# Patient Record
Sex: Male | Born: 1935 | Race: White | Hispanic: No | Marital: Married | Smoking: Former smoker
Health system: Southern US, Community
[De-identification: ages and names within clinical notes are randomized; demographics above are authoritative.]

## PROBLEM LIST (undated history)

## (undated) ENCOUNTER — Emergency Department (HOSPITAL_BASED_OUTPATIENT_CLINIC_OR_DEPARTMENT_OTHER): Admission: EM | Payer: Medicare Other | Source: Home / Self Care

## (undated) DIAGNOSIS — Z9289 Personal history of other medical treatment: Secondary | ICD-10-CM

## (undated) DIAGNOSIS — M545 Low back pain, unspecified: Secondary | ICD-10-CM

## (undated) DIAGNOSIS — J42 Unspecified chronic bronchitis: Secondary | ICD-10-CM

## (undated) DIAGNOSIS — F32A Depression, unspecified: Secondary | ICD-10-CM

## (undated) DIAGNOSIS — E7800 Pure hypercholesterolemia, unspecified: Secondary | ICD-10-CM

## (undated) DIAGNOSIS — J45909 Unspecified asthma, uncomplicated: Secondary | ICD-10-CM

## (undated) DIAGNOSIS — J329 Chronic sinusitis, unspecified: Secondary | ICD-10-CM

## (undated) DIAGNOSIS — K219 Gastro-esophageal reflux disease without esophagitis: Secondary | ICD-10-CM

## (undated) DIAGNOSIS — Z9989 Dependence on other enabling machines and devices: Secondary | ICD-10-CM

## (undated) DIAGNOSIS — M1990 Unspecified osteoarthritis, unspecified site: Secondary | ICD-10-CM

## (undated) DIAGNOSIS — F419 Anxiety disorder, unspecified: Secondary | ICD-10-CM

## (undated) DIAGNOSIS — M79606 Pain in leg, unspecified: Secondary | ICD-10-CM

## (undated) DIAGNOSIS — G8929 Other chronic pain: Secondary | ICD-10-CM

## (undated) DIAGNOSIS — I2510 Atherosclerotic heart disease of native coronary artery without angina pectoris: Secondary | ICD-10-CM

## (undated) DIAGNOSIS — F329 Major depressive disorder, single episode, unspecified: Secondary | ICD-10-CM

## (undated) DIAGNOSIS — J302 Other seasonal allergic rhinitis: Secondary | ICD-10-CM

## (undated) DIAGNOSIS — C4491 Basal cell carcinoma of skin, unspecified: Secondary | ICD-10-CM

## (undated) DIAGNOSIS — G4733 Obstructive sleep apnea (adult) (pediatric): Secondary | ICD-10-CM

## (undated) DIAGNOSIS — I493 Ventricular premature depolarization: Secondary | ICD-10-CM

## (undated) DIAGNOSIS — I519 Heart disease, unspecified: Secondary | ICD-10-CM

## (undated) HISTORY — PX: TOTAL HIP ARTHROPLASTY: SHX124

## (undated) HISTORY — DX: Pain in leg, unspecified: M79.606

## (undated) HISTORY — PX: BASAL CELL CARCINOMA EXCISION: SHX1214

## (undated) HISTORY — PX: BACK SURGERY: SHX140

## (undated) HISTORY — PX: CHOLECYSTECTOMY: SHX55

## (undated) HISTORY — DX: Ventricular premature depolarization: I49.3

## (undated) HISTORY — PX: SHOULDER ARTHROSCOPY: SHX128

## (undated) HISTORY — PX: CORONARY ANGIOPLASTY WITH STENT PLACEMENT: SHX49

## (undated) HISTORY — DX: Heart disease, unspecified: I51.9

## (undated) HISTORY — DX: Personal history of other medical treatment: Z92.89

## (undated) HISTORY — PX: JOINT REPLACEMENT: SHX530

## (undated) HISTORY — PX: COLONOSCOPY: SHX174

## (undated) HISTORY — PX: TONSILLECTOMY: SUR1361

---

## 1965-02-15 HISTORY — PX: APPENDECTOMY: SHX54

## 1978-10-17 HISTORY — PX: CARPAL TUNNEL RELEASE: SHX101

## 1987-02-16 HISTORY — PX: ANTERIOR CERVICAL DISCECTOMY: SHX1160

## 1996-02-16 HISTORY — PX: ANTERIOR CERVICAL DECOMP/DISCECTOMY FUSION: SHX1161

## 2000-02-16 HISTORY — PX: REVISION TOTAL HIP ARTHROPLASTY: SHX766

## 2003-01-22 ENCOUNTER — Encounter: Admission: RE | Admit: 2003-01-22 | Discharge: 2003-01-22 | Payer: Self-pay | Admitting: Neurosurgery

## 2003-02-26 ENCOUNTER — Inpatient Hospital Stay (HOSPITAL_COMMUNITY): Admission: RE | Admit: 2003-02-26 | Discharge: 2003-02-28 | Payer: Self-pay | Admitting: Orthopaedic Surgery

## 2004-12-22 ENCOUNTER — Ambulatory Visit (HOSPITAL_COMMUNITY): Admission: RE | Admit: 2004-12-22 | Discharge: 2004-12-22 | Payer: Self-pay | Admitting: Orthopaedic Surgery

## 2005-05-27 ENCOUNTER — Ambulatory Visit (HOSPITAL_COMMUNITY): Admission: RE | Admit: 2005-05-27 | Discharge: 2005-05-27 | Payer: Self-pay | Admitting: Orthopaedic Surgery

## 2008-09-16 ENCOUNTER — Encounter: Admission: RE | Admit: 2008-09-16 | Discharge: 2008-09-16 | Payer: Self-pay | Admitting: Orthopaedic Surgery

## 2008-09-23 ENCOUNTER — Ambulatory Visit (HOSPITAL_COMMUNITY): Admission: RE | Admit: 2008-09-23 | Discharge: 2008-09-24 | Payer: Self-pay | Admitting: Orthopaedic Surgery

## 2009-03-22 ENCOUNTER — Encounter: Admission: RE | Admit: 2009-03-22 | Discharge: 2009-03-22 | Payer: Self-pay | Admitting: Orthopaedic Surgery

## 2009-09-06 ENCOUNTER — Inpatient Hospital Stay (HOSPITAL_COMMUNITY): Admission: EM | Admit: 2009-09-06 | Discharge: 2009-09-08 | Payer: Self-pay | Admitting: Emergency Medicine

## 2009-09-06 ENCOUNTER — Ambulatory Visit: Payer: Self-pay | Admitting: Interventional Radiology

## 2009-09-06 ENCOUNTER — Encounter: Payer: Self-pay | Admitting: Emergency Medicine

## 2009-09-12 ENCOUNTER — Emergency Department (HOSPITAL_COMMUNITY): Admission: EM | Admit: 2009-09-12 | Discharge: 2009-09-12 | Payer: Self-pay | Admitting: Emergency Medicine

## 2010-05-02 LAB — BASIC METABOLIC PANEL
BUN: 28 mg/dL — ABNORMAL HIGH (ref 6–23)
CO2: 24 mEq/L (ref 19–32)
Calcium: 9.8 mg/dL (ref 8.4–10.5)
Chloride: 109 mEq/L (ref 96–112)
Creatinine, Ser: 1.1 mg/dL (ref 0.4–1.5)
GFR calc Af Amer: >60 mL/min (ref >60)
GFR calc non Af Amer: >60 mL/min (ref >60)
Glucose, Bld: 129 mg/dL — ABNORMAL HIGH (ref 70–99)
Potassium: 4.2 mEq/L (ref 3.5–5.1)
Sodium: 143 mEq/L (ref 135–145)

## 2010-05-02 LAB — DIFFERENTIAL
Basophils Absolute: 0 10*3/uL (ref 0.0–0.1)
Basophils Absolute: 0 10*3/uL (ref 0.0–0.1)
Basophils Relative: 0 % (ref 0–1)
Basophils Relative: 0 % (ref 0–1)
Eosinophils Absolute: 0.5 10*3/uL (ref 0.0–0.7)
Eosinophils Absolute: 0.3 10*3/uL (ref 0.0–0.7)
Eosinophils Relative: 5 % (ref 0–5)
Eosinophils Relative: 2 % (ref 0–5)
Lymphocytes Relative: 11 % — ABNORMAL LOW (ref 12–46)
Lymphocytes Relative: 8 % — ABNORMAL LOW (ref 12–46)
Lymphs Abs: 1.1 10*3/uL (ref 0.7–4.0)
Lymphs Abs: 1 10*3/uL (ref 0.7–4.0)
Monocytes Absolute: 0.9 10*3/uL (ref 0.1–1.0)
Monocytes Absolute: 0.7 10*3/uL (ref 0.1–1.0)
Monocytes Relative: 10 % (ref 3–12)
Monocytes Relative: 5 % (ref 3–12)
Neutro Abs: 7 10*3/uL (ref 1.7–7.7)
Neutro Abs: 11.9 10*3/uL — ABNORMAL HIGH (ref 1.7–7.7)
Neutrophils Relative %: 74 % (ref 43–77)
Neutrophils Relative %: 86 % — ABNORMAL HIGH (ref 43–77)

## 2010-05-02 LAB — CBC
HCT: 40.2 % (ref 39.0–52.0)
HCT: 43 % (ref 39.0–52.0)
Hemoglobin: 13.7 g/dL (ref 13.0–17.0)
Hemoglobin: 14.9 g/dL (ref 13.0–17.0)
MCH: 32.9 pg (ref 26.0–34.0)
MCH: 32.3 pg (ref 26.0–34.0)
MCHC: 34.1 g/dL (ref 30.0–36.0)
MCHC: 34.5 g/dL (ref 30.0–36.0)
MCV: 96.4 fL (ref 78.0–100.0)
MCV: 93.7 fL (ref 78.0–100.0)
Platelets: 240 10*3/uL (ref 150–400)
Platelets: 259 10*3/uL (ref 150–400)
RBC: 4.17 MIL/uL — ABNORMAL LOW (ref 4.22–5.81)
RBC: 4.59 MIL/uL (ref 4.22–5.81)
RDW: 14.6 % (ref 11.5–15.5)
RDW: 14 % (ref 11.5–15.5)
WBC: 9.5 10*3/uL (ref 4.0–10.5)
WBC: 13.9 10*3/uL — ABNORMAL HIGH (ref 4.0–10.5)

## 2010-05-02 LAB — URINALYSIS, ROUTINE W REFLEX MICROSCOPIC
Bilirubin Urine: NEGATIVE
Bilirubin Urine: NEGATIVE
Glucose, UA: NEGATIVE mg/dL
Glucose, UA: NEGATIVE mg/dL
Hgb urine dipstick: NEGATIVE
Hgb urine dipstick: NEGATIVE
Ketones, ur: NEGATIVE mg/dL
Ketones, ur: NEGATIVE mg/dL
Nitrite: NEGATIVE
Nitrite: NEGATIVE
Protein, ur: NEGATIVE mg/dL
Protein, ur: NEGATIVE mg/dL
Specific Gravity, Urine: 1.005 (ref 1.005–1.030)
Specific Gravity, Urine: 1.028 (ref 1.005–1.030)
Urobilinogen, UA: 1 mg/dL (ref 0.0–1.0)
Urobilinogen, UA: 1 mg/dL (ref 0.0–1.0)
pH: 7 (ref 5.0–8.0)
pH: 5.5 (ref 5.0–8.0)

## 2010-05-02 LAB — COMPREHENSIVE METABOLIC PANEL
ALT: 25 U/L (ref 0–53)
AST: 28 U/L (ref 0–37)
Albumin: 3.2 g/dL — ABNORMAL LOW (ref 3.5–5.2)
Alkaline Phosphatase: 76 U/L (ref 39–117)
BUN: 14 mg/dL (ref 6–23)
CO2: 30 mEq/L (ref 19–32)
Calcium: 9.3 mg/dL (ref 8.4–10.5)
Chloride: 101 mEq/L (ref 96–112)
Creatinine, Ser: 0.93 mg/dL (ref 0.4–1.5)
GFR calc Af Amer: >60 mL/min (ref >60)
GFR calc non Af Amer: >60 mL/min (ref >60)
Glucose, Bld: 107 mg/dL — ABNORMAL HIGH (ref 70–99)
Potassium: 4.1 mEq/L (ref 3.5–5.1)
Sodium: 136 mEq/L (ref 135–145)
Total Bilirubin: 0.7 mg/dL (ref 0.3–1.2)
Total Protein: 6.8 g/dL (ref 6.0–8.3)

## 2010-05-02 LAB — LIPASE, BLOOD: Lipase: 21 U/L (ref 11–59)

## 2010-05-23 LAB — CBC
HCT: 45.1 % (ref 39.0–52.0)
Hemoglobin: 15.7 g/dL (ref 13.0–17.0)
MCHC: 34.8 g/dL (ref 30.0–36.0)
MCV: 93.9 fL (ref 78.0–100.0)
Platelets: 258 10*3/uL (ref 150–400)
RBC: 4.8 MIL/uL (ref 4.22–5.81)
RDW: 13.9 % (ref 11.5–15.5)
WBC: 8.1 10*3/uL (ref 4.0–10.5)

## 2010-05-23 LAB — COMPREHENSIVE METABOLIC PANEL
ALT: 27 U/L (ref 0–53)
AST: 28 U/L (ref 0–37)
Albumin: 3.8 g/dL (ref 3.5–5.2)
Alkaline Phosphatase: 102 U/L (ref 39–117)
BUN: 23 mg/dL (ref 6–23)
CO2: 27 mEq/L (ref 19–32)
Calcium: 10 mg/dL (ref 8.4–10.5)
Chloride: 104 mEq/L (ref 96–112)
Creatinine, Ser: 1.04 mg/dL (ref 0.4–1.5)
GFR calc Af Amer: >60 mL/min (ref >60)
GFR calc non Af Amer: >60 mL/min (ref >60)
Glucose, Bld: 118 mg/dL — ABNORMAL HIGH (ref 70–99)
Potassium: 4.2 mEq/L (ref 3.5–5.1)
Sodium: 138 mEq/L (ref 135–145)
Total Bilirubin: 1 mg/dL (ref 0.3–1.2)
Total Protein: 7.2 g/dL (ref 6.0–8.3)

## 2010-05-23 LAB — DIFFERENTIAL
Basophils Absolute: 0 10*3/uL (ref 0.0–0.1)
Basophils Relative: 0 % (ref 0–1)
Eosinophils Absolute: 0.4 10*3/uL (ref 0.0–0.7)
Eosinophils Relative: 6 % — ABNORMAL HIGH (ref 0–5)
Lymphocytes Relative: 20 % (ref 12–46)
Lymphs Abs: 1.6 10*3/uL (ref 0.7–4.0)
Monocytes Absolute: 0.7 10*3/uL (ref 0.1–1.0)
Monocytes Relative: 8 % (ref 3–12)
Neutro Abs: 5.3 10*3/uL (ref 1.7–7.7)
Neutrophils Relative %: 65 % (ref 43–77)

## 2010-06-27 ENCOUNTER — Emergency Department (INDEPENDENT_AMBULATORY_CARE_PROVIDER_SITE_OTHER): Payer: Medicare Other

## 2010-06-27 ENCOUNTER — Emergency Department (HOSPITAL_BASED_OUTPATIENT_CLINIC_OR_DEPARTMENT_OTHER)
Admission: EM | Admit: 2010-06-27 | Discharge: 2010-06-27 | Disposition: A | Discharge status: Home / Self Care | Payer: Medicare Other | Attending: Emergency Medicine | Admitting: Emergency Medicine

## 2010-06-27 DIAGNOSIS — W230XXA Caught, crushed, jammed, or pinched between moving objects, initial encounter: Secondary | ICD-10-CM

## 2010-06-27 DIAGNOSIS — S61209A Unspecified open wound of unspecified finger without damage to nail, initial encounter: Secondary | ICD-10-CM

## 2010-06-27 DIAGNOSIS — IMO0002 Reserved for concepts with insufficient information to code with codable children: Secondary | ICD-10-CM | POA: Insufficient documentation

## 2010-06-30 NOTE — Op Note (Signed)
NAME:  Larry Church, Larry Church                ACCOUNT NO.:  [id number]   MEDICAL RECORD NO.:  [id number]          PATIENT TYPE:  OIB   LOCATION:  5039                         FACILITY:  [facility]   PHYSICIAN:  Mark C. Ophelia Charter, M.D.    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   POSTOPERATIVE DIAGNOSIS:  L2-3 central stenosis with right foraminal  stenosis.   PROCEDURE:  L2-3 central decompression, bilateral foraminotomy.   SURGEON:  Annell Greening, MD   ANESTHESIA:  GOT plus Marcaine local.   ESTIMATED BLOOD LOSS:  200 mL.   BRIEF HISTORY:  This 77 year old male has had previous decompression at  L3-4 level and developed stenosis at L2-3 central foramina with more leg  pain than back pain and with neurogenic claudication symptoms with  ambulation.   PROCEDURE:  After induction of general anesthesia, orotracheal  intubation, preoperative vancomycin, placed on chest rolls.  Back was  prepped with DuraPrep, arms placed in 90/90 of pads underneath the  shoulders on the nerve and ulnar nerve.  The area was squared with  towels, Sterile skin mark used in the back and Betadine V-Drape applied  with laminectomy sheet.  Surgical time-out checklist was completed.  Incision was made extending the old incision proximally.  Subperiosteal  dissection after needle localization with spinal needle which was  directly over the L2-3 disk.  Once dissection down further spinous  process and central laminectomy was performed.  Kocher clamp was placed  above and below the laminectomy area and confirmed with a cross-table  lateral x-ray, this was the planned areas for decompression.  Hypertrophic thick chunks of ligaments were removed.  There were facet  spurs overhanging causing compression at the shoulder of the nerve root  worse on the right than the  left and this was with hypertrophic  ligamentum which showed some areas of previous cortisone injections.  Dura was intact.  There was no spinal fluid leakage.  Disk was inspected  on both sides.  No diskectomy was performed.  There was some combination  of hard disk with endplate spurring present.  After decompression  palpation on the hockey stick with both gutters showed smooth bone, bone  was removed out on most of the level of the pedicle on each side.  Wound  was irrigated.  Some thrombin-soaked Gelfoam were placed in the gutters.  These were removed.  There was some mild upper dural bleeding which was  partially controlled with bipolar cautery.  Fascia was closed with 0  Vicryl, 2-0  Vicryl.  After irrigation, inspection for bleeding some placement of new  Gelfoam thrombin and then removal of the thrombin-soaked Gelfoam and  patties.  At the time of closure, patty count was correct.  2-0 Vicryl  placed in the subcutaneous tissue and subcuticular skin closure.  Tincture of Benzoin and Steri-Strips as postop dressing.      Mark C. Ophelia Charter, M.D.  Electronically Signed  Mark C. Ophelia Charter, M.D.  Electronically Signed    MCY/MEDQ  D:  09/23/2008  T:  09/24/2008  Job:  [id number]

## 2010-06-30 NOTE — Op Note (Signed)
NAME:  Larry Church, Larry Church                ACCOUNT NO.:  [id number]   MEDICAL RECORD NO.:  [id number]          PATIENT TYPE:  OIB   LOCATION:  5039                         FACILITY:  [facility]   PHYSICIAN:  Mark C. Ophelia Charter, M.D.    DATE OF BIRTH:  [birth year]   DATE OF PROCEDURE:  DATE OF DISCHARGE:                               OPERATIVE REPORT   ADDENDUM   PREOPERATIVE DIAGNOSIS:  L2-3 stenosis with right foraminal stenosis.   POSTOPERATIVE DIAGNOSIS:  L2-3 stenosis with right foraminal stenosis.   PROCEDURE:  L2-3 central decompression and foraminotomy.   SURGEON:  Mark C. Ophelia Charter, MD   ASSISTANT:  Wende Neighbors, PA-C   Microscope was used for visualization for decompression and Maud Deed, PA, assisted with careful retraction of the neural elements  during the procedure.      Mark C. Ophelia Charter, M.D.  Electronically Signed     Veverly Fells. Ophelia Charter, M.D.  Electronically Signed    MCY/MEDQ  D:  09/23/2008  T:  09/24/2008  Job:  [id number]

## 2010-07-03 NOTE — Op Note (Signed)
NAME:  Larry Church, Larry Church                          ACCOUNT NO.:  [id number]   MEDICAL RECORD NO.:  [id number]                   PATIENT TYPE:  INP   LOCATION:  2899                                 FACILITY:  [facility]   PHYSICIAN:  Lubertha Basque. Jerl Santos, M.D.             DATE OF BIRTH:  [birth year]   DATE OF PROCEDURE:  02/26/2003  DATE OF DISCHARGE:                                 OPERATIVE REPORT   PREOPERATIVE DIAGNOSIS:  Painful right total hip replacement.   POSTOPERATIVE DIAGNOSIS:  Painful right total hip replacement.   PROCEDURE:  Revision right total hip replacement.   ANESTHESIA:  General.   SURGEON:  Lubertha Basque. Jerl Santos, M.D.   ASSISTANT:  Bradley Ferris, M.D. and Lindwood Qua, P.A.   INDICATIONS FOR PROCEDURE:  The patient is a 77 year old man about 15 years  out from a successful right hip replacement.  He has developed recurrent  groin and buttock pain with no thigh pain.  It persisted for many months. On  x-ray, he has significant polywear with migration of the femoral head  towards the metal acetabular component.  His acetabular component appears to  be well fixed with no sign of lysis. His femoral component appears well  fixed proximally.  There is the suggestion of some wobble distally.  He is  offered a revision procedure to consist of poly exchange with possible  revision of the femoral component as well.  Informed operative consent was  obtained after discussion of the possible complications of, reaction to  anesthesia, infection, DVT, PE, dislocation, and death.   DESCRIPTION OF PROCEDURE:  The patient was seen in the operating suite where  general anesthetic was applied without difficulty.  He was positioned in the  lateral decubitus position.  All bony prominences were appropriately padded  and the axillary roll was used.  Hip positioners were also utilized. He was  then prepped and draped in normal sterile fashion.  After the administration  of preop antibiotics,  a portion of the old incision was made with the  posterior approach taken to his hip.  We dissected through an abundance of  adipose tissue through the IT band and gluteus maximus fascia.  These  structures incised longitudinally to expose the short external rotators and  posterior capsule of the hip. These structures were tagged and reflected.  I  then dislocated his hip.  I exposed the femoral component completely  removing soft tissues down to the calcar.  I also removed some bone along  the lateral aspect of the component in the greater trochanter to again  expose the entire proximal portion of the component.  He did have some  devitalized material along the proximal stem which extended down about 1 or  2 cm around much of the stem but then firm bone was encountered.  I placed a  curette in the hole in the stem and tried to rotate the  stem which seemed to  be very well fixed.  I then placed a slap hammer in the same hole and then  with great force tried to extract the stem.  The stem did not move.  It was  my assessment that the stem was stable.  The existing ball was removed off  the stem prior to this portion of the procedure. I then exposed the  acetabular component completely.  I used a bone tap and Kocher and push and  pull on the acetabular component and this too seemed to be very well fixed.  I removed the worn polyethylene liner.  I used the curette to remove some  yellowish material consistent with polydisease from the holes in the cup and  irrigated this thoroughly.  I then placed a new Zimmer _________1 cross link  poly component.  This seated fully.  I then placed the new 32 mm cobalt  chrome Zimmer head medium length on the stem.  The hip reduced and was  stable in extension with external rotation and flexion with internal  rotation.  The wound was thoroughly irrigated. I did take cultures in the  initial portion of the case and those were sent to pathology for routine   study.  There was no sign of infection on entry into the hip.  The short  external rotators and posterior capsule reapproximated to the greater  trochanter with nonabsorbable suture.  The wound was again irrigated. I  closed the fascial structures with Ethibond and #1 Vicryl in interrupted  fashion.  Adipose tissue was reapproximated in 2 or 3 layers with undyed  Vicryl followed by skin closure with staples.  Adaptic was applied to the  wound followed by dry gauze tape. Estimated blood loss and intraoperative  fluids can be obtained from anesthesia records.   DISPOSITION:  The patient was extubated in the operating room and taken to  the recovery room stable.  Plans were for him to be admitted to the  orthopedic surgery service for appropriate postop care to include  perioperative antibiotics and Coumadin plus Lovenox for DVT prophylaxis.                                               Lubertha Basque Jerl Santos, M.D.    PGD/MEDQ  D:  02/26/2003  T:  02/26/2003  Job:  [id number]

## 2010-07-03 NOTE — Op Note (Signed)
Larry Church, Larry Church                ACCOUNT NO.:  [id number]   MEDICAL RECORD NO.:  [id number]          PATIENT TYPE:  AMB   LOCATION:  SDS                          FACILITY:  [facility]   PHYSICIAN:  Lubertha Basque. Dalldorf, M.D.DATE OF BIRTH:  [birth year]   DATE OF PROCEDURE:  05/27/2005  DATE OF DISCHARGE:                                 OPERATIVE REPORT   PREOPERATIVE DIAGNOSES:  1.  Right shoulder rotator cuff tear.  2.  Right shoulder acromioclavicular degeneration.  3.  Right shoulder biceps degeneration.   POSTOPERATIVE DIAGNOSES:  1.  Right shoulder rotator cuff tear.  2.  Right shoulder acromioclavicular degeneration.  3.  Right shoulder biceps degeneration.   PROCEDURES:  1.  Right shoulder arthroscopic debridement.  2.  Right shoulder arthroscopic acromioplasty.  3.  Right shoulder arthroscopic AC resection.  4.  Right shoulder arthroscopic biceps tenotomy.   ANESTHESIA:  General and block.   ATTENDING SURGEON:  Lubertha Basque. Jerl Santos, M.D.   INDICATIONS FOR PROCEDURE:  The patient is a 77 year old male with a long  history of right shoulder pain.  He has had pain which persists despite oral  anti-inflammatories and injections which have helped him transiently.  He  has pain which limits his ability to rest and to use his arm.  It is felt  that he likely has a large rotator cuff tear but has things consistent with  impingement as well and AC pain.  He is status post a successful arthroscopy  on the opposite shoulder many years ago for a similar problem and he is  offered the same procedure on this side.  Informed operative consent was  obtained after discussion about possible complications of reaction to  anesthesia, and infection.   DESCRIPTION OF PROCEDURE:  The patient was taken to the operating suite  where general anesthetic was applied without difficulty.  He was also given  a block in the preanesthesia area.  He was placed in a beach-chair position  and prepped and  draped in a normal sterile fashion.  After the  administration of intravenous clindamycin, arthroscopy of the right shoulder  was performed through a total of three portals.  The glenohumeral joint  showed some mild to moderate degenerative change.  He did have a large  osteophyte on the posterior superior aspect of the labrum which I removed.  This seemed to be loose and potentially could have been getting caught in  the joint.  He also had severe degeneration of the biceps tendon and a  simple tenotomy was performed as on the opposite side with a debridement  back towards the labrum.  He had a massive rotator cuff tear which was  irreparable and a brief debridement was done here.  He had a prominent  subacromial morphology addressed with an acromioplasty back to a flat  surface.  This was done with the bur in the lateral position followed by  transfer of the bur to the posterior position.  He also had bone-on-bone  contact and an impressive spur at the [facility] joint, and a formal AC  decompression was done.  The shoulder was thoroughly irrigated followed by  placement of some Marcaine at the end of the case.  Simple sutures of nylon  were used to loosely reapproximate the portals followed by Adaptic and dry  gauze dressing with tape.  Estimated blood loss and intraoperative fluids  can be obtained from anesthesia records.   DISPOSITION:  The patient was extubated in the operating room and taken to  the recovery room in stable addition.  Plans were for him go home same-day  and follow up in the office in less than a week.  I will contact him by  phone tonight.  The procedure was done in the main operating room as the  patient had a history of sleep apnea though he said it was cured by sleeping  on an  extra pillow.      Lubertha Basque Jerl Santos, M.D.  Electronically Signed     PGD/MEDQ  D:  05/27/2005  T:  05/27/2005  Job:  [id number]

## 2010-07-03 NOTE — Discharge Summary (Signed)
NAME:  Larry Church, Larry Church                          ACCOUNT NO.:  [id number]   MEDICAL RECORD NO.:  [id number]                   PATIENT TYPE:  INP   LOCATION:  5010                                 FACILITY:  [facility]   PHYSICIAN:  Lubertha Basque. Jerl Santos, M.D.             DATE OF BIRTH:  [birth year]   DATE OF ADMISSION:  02/26/2003  DATE OF DISCHARGE:  02/28/2003                                 DISCHARGE SUMMARY   ADMITTING DIAGNOSES:  1. Status post right total hip replacement with worn out poly liner.  2. Status post neck fusion.  3. Status post lumbar laminectomy.  4. Hypercholesterolemia.   DISCHARGE DIAGNOSES:  1. Status post right total hip replacement with worn out poly liner.  2. Status post neck fusion.  3. Status post lumbar laminectomy.  4. Hypercholesterolemia.   OPERATIONS:  A right hip replacement, poly swap for the acetabular liner.   BRIEF HISTORY:  Larry Church is a 77 year old white male patient well known to  our practice who many years ago had a right total hip replacement, and on x-  ray now is having wear that is obvious of the poly liner in the acetabulum.  He is having some thigh pain and some groin pain, and, after consultation  with the patient, we decided to proceed with poly liner replacement.   PERTINENT LABORATORY AND X-RAY FINDINGS:  WBC 7.4, hemoglobin 12.8,  hematocrit 36.6.  INR's drawn serially; the last one was 1.6.  Glucose 137.  Other indices were normal.  Wound growth negative.  Normal sinus rhythm with  a first degree AV block on EKG.   COURSE IN THE HOSPITAL:  He was admitted postoperatively, placed on  __________ p.o., and IM analgesics for pain, IV Ancef 1 gm q.8h.  He was  also kept on his usual medicines of Effexor, Altace, Singulair, Zocor,  Protonix, and then on low-dose Coumadin protocol and Lovenox protocol per  pharmacy.  The first day postoperatively, he did very well.  He was up and  out of bed with physical therapy, and could be  weightbearing as tolerated.  The second day he was dressing changed.  The wound was noted to be benign.  He was eating and voiding well, pain controlled by oral medications, and was  discharged on day #3.   CONDITION ON DISCHARGE:  Improved.   DISCHARGE MEDICATIONS:  1. Vicodin for pain, one q.4-6h. as needed.  2. Pharmacy for Coumadin protocol, and they would dose him accordingly.  3. Protonix 40 mg one p.o. daily.  4. Zocor 20 mg one p.o. daily.  5. Singulair 10 mg one p.o. daily.  6. Altace 5 mg one p.o. b.i.d.  7. Effexor 37.5 one p.o. daily.   ACTIVITY:  Weightbearing as tolerated.  [facility] for home  therapy and blood draws for pro times.   DIET:  Unrestricted.   WOUND CARE:  May change the  dressing daily.   FOLLOW UP:  Return to our office in 7-10 days.  Office number [phone] to  call.      Larry Church, P.A.                    Lubertha Basque Jerl Santos, M.D.    MC/MEDQ  D:  03/18/2003  T:  03/18/2003  Job:  [id number]

## 2011-02-16 HISTORY — PX: EYE MUSCLE SURGERY: SHX370

## 2011-02-16 HISTORY — PX: CATARACT EXTRACTION W/ INTRAOCULAR LENS  IMPLANT, BILATERAL: SHX1307

## 2012-04-29 ENCOUNTER — Emergency Department (HOSPITAL_BASED_OUTPATIENT_CLINIC_OR_DEPARTMENT_OTHER)
Admission: EM | Admit: 2012-04-29 | Discharge: 2012-04-29 | Disposition: A | Discharge status: Home / Self Care | Payer: Medicare Other | Attending: Emergency Medicine | Admitting: Emergency Medicine

## 2012-04-29 ENCOUNTER — Emergency Department (HOSPITAL_BASED_OUTPATIENT_CLINIC_OR_DEPARTMENT_OTHER): Payer: Medicare Other

## 2012-04-29 ENCOUNTER — Encounter (HOSPITAL_BASED_OUTPATIENT_CLINIC_OR_DEPARTMENT_OTHER): Payer: Self-pay | Admitting: Emergency Medicine

## 2012-04-29 DIAGNOSIS — Z79899 Other long term (current) drug therapy: Secondary | ICD-10-CM | POA: Insufficient documentation

## 2012-04-29 DIAGNOSIS — K219 Gastro-esophageal reflux disease without esophagitis: Secondary | ICD-10-CM | POA: Insufficient documentation

## 2012-04-29 DIAGNOSIS — Z7982 Long term (current) use of aspirin: Secondary | ICD-10-CM | POA: Insufficient documentation

## 2012-04-29 DIAGNOSIS — W010XXA Fall on same level from slipping, tripping and stumbling without subsequent striking against object, initial encounter: Secondary | ICD-10-CM | POA: Insufficient documentation

## 2012-04-29 DIAGNOSIS — M1990 Unspecified osteoarthritis, unspecified site: Secondary | ICD-10-CM | POA: Insufficient documentation

## 2012-04-29 DIAGNOSIS — S2249XA Multiple fractures of ribs, unspecified side, initial encounter for closed fracture: Secondary | ICD-10-CM | POA: Insufficient documentation

## 2012-04-29 DIAGNOSIS — Y9301 Activity, walking, marching and hiking: Secondary | ICD-10-CM | POA: Insufficient documentation

## 2012-04-29 DIAGNOSIS — Y92009 Unspecified place in unspecified non-institutional (private) residence as the place of occurrence of the external cause: Secondary | ICD-10-CM | POA: Insufficient documentation

## 2012-04-29 DIAGNOSIS — S2232XA Fracture of one rib, left side, initial encounter for closed fracture: Secondary | ICD-10-CM

## 2012-04-29 DIAGNOSIS — K5900 Constipation, unspecified: Secondary | ICD-10-CM | POA: Insufficient documentation

## 2012-04-29 HISTORY — DX: Unspecified osteoarthritis, unspecified site: M19.90

## 2012-04-29 HISTORY — DX: Other seasonal allergic rhinitis: J30.2

## 2012-04-29 HISTORY — DX: Gastro-esophageal reflux disease without esophagitis: K21.9

## 2012-04-29 MED ORDER — HYDROCODONE-ACETAMINOPHEN 5-325 MG PO TABS: 1.0000 | ORAL_TABLET | Freq: Four times a day (QID) | ORAL | Status: DC | PRN

## 2012-04-29 MED ORDER — HYDROCODONE-ACETAMINOPHEN 5-325 MG PO TABS
1.0000 | ORAL_TABLET | Freq: Once | ORAL | Status: AC
  Administered 2012-04-29: 1 via ORAL
  Filled 2012-04-29: qty 1

## 2012-04-29 NOTE — ED Notes (Signed)
Pt fell one week ago on a loose rock.  Pt states he fell on his cell phone at left rib area.  Noted bruising at area.  Pt states he turned this morning and believes something popped.  Pt having pain at left ribs.  No SOB.  Pain reproducible.

## 2012-04-29 NOTE — ED Provider Notes (Signed)
History     CSN: [id number]  Arrival date & time 04/29/12  0941   First MD Initiated Contact with Patient 04/29/12 [phone]      Chief Complaint  Patient presents with  . Fall  . Rib Injury    (Consider location/radiation/quality/duration/timing/severity/associated sxs/prior treatment) Patient is a 79 y.o. male presenting with fall.  Fall   Pt reports about a [place] and fell, landing on his left side and sustaining an injury to his L lower chest wall. He has had a bruise there for several days, but pain was minimal until this morning when he was getting up from the toilet and twisted. HE reports sudden onset of severe sharp pain, worse with movement, deep breath and coughing. He took a left over Darvocet and Aleve x 2 with minimal improvement.   No past medical history on file.  No past surgical history on file.  No family history on file.  History  Substance Use Topics  . Smoking status: Not on file  . Smokeless tobacco: Not on file  . Alcohol Use: Not on file      Review of Systems All other systems reviewed and are negative except as noted in HPI.   Allergies  Review of patient's allergies indicates not on file.  Home Medications  No current outpatient prescriptions on file.  BP 143/113  Pulse 99  Temp(Src) 97.6 F (36.4 C) (Oral)  Resp 22  Ht 5\' 5"  (1.651 m)  Wt 225 lb (102.059 kg)  BMI 37.44 kg/m2  SpO2 100%  Physical Exam  Nursing note and vitals reviewed. Constitutional: He is oriented to person, place, and time. He appears well-developed and well-nourished.  HENT:  Head: Normocephalic and atraumatic.  Eyes: EOM are normal. Pupils are equal, round, and reactive to light.  Neck: Normal range of motion. Neck supple.  Cardiovascular: Normal rate, normal heart sounds and intact distal pulses.   Pulmonary/Chest: Effort normal and breath sounds normal. He exhibits tenderness (point tender over the L anterior lower  ribs, healing bruise to that area).  Abdominal: Bowel sounds are normal. He exhibits no distension. There is no tenderness.  Musculoskeletal: Normal range of motion. He exhibits no edema and no tenderness.  Neurological: He is alert and oriented to person, place, and time. He has normal strength. No cranial nerve deficit or sensory deficit.  Skin: Skin is warm and dry. No rash noted.  Psychiatric: He has a normal mood and affect.    ED Course  Procedures (including critical care time)  Labs Reviewed - No data to display Dg Ribs Unilateral W/chest Left  04/29/2012  *RADIOLOGY REPORT*  Clinical Data: Fall.  Left rib pain.  LEFT RIBS AND CHEST - 3+ VIEW  Comparison: Two-view chest 09/07/2009.  Findings: Heart is mildly enlarged.  Mild interstitial coarsening is chronic.  Dedicated imaging of the ribs demonstrates multiple healed left- sided rib fractures.  At least to the acute minimally-displaced fractures are present in the left sixth and seventh ribs.  There may be a fracture of the left fifth rib as well.  There is no pneumothorax.  IMPRESSION:  1.  At least two new acute fractures are present in the left sixth and seventh ribs. 2.  Multiple healed remote fractures are present in the left ribs. 3.  Stable cardiomegaly without failure.   Original Report Authenticated By: Marin Roberts, M.D.      1. Rib fracture, left, closed, initial encounter  MDM  Xray as above. Pain improved with norco. Given Rx for same, incentive spirometry, advised PCP followup. Return for worsening.         Charles B. Bernette Mayers, MD 04/29/12 1043

## 2012-05-04 ENCOUNTER — Encounter: Payer: Self-pay | Admitting: Internal Medicine

## 2012-07-11 ENCOUNTER — Ambulatory Visit: Payer: Medicare Other | Admitting: Internal Medicine

## 2012-10-19 ENCOUNTER — Other Ambulatory Visit: Payer: Self-pay | Admitting: Neurosurgery

## 2012-10-19 DIAGNOSIS — M4800 Spinal stenosis, site unspecified: Secondary | ICD-10-CM

## 2012-10-26 ENCOUNTER — Ambulatory Visit
Admission: RE | Admit: 2012-10-26 | Discharge: 2012-10-26 | Disposition: A | Discharge status: Home / Self Care | Payer: Medicare Other | Source: Ambulatory Visit | Attending: Neurosurgery | Admitting: Neurosurgery

## 2012-10-26 ENCOUNTER — Other Ambulatory Visit: Payer: Self-pay | Admitting: Neurosurgery

## 2012-10-26 DIAGNOSIS — M4800 Spinal stenosis, site unspecified: Secondary | ICD-10-CM

## 2012-11-22 ENCOUNTER — Other Ambulatory Visit: Payer: Self-pay | Admitting: Neurosurgery

## 2012-11-28 ENCOUNTER — Encounter (HOSPITAL_COMMUNITY): Payer: Self-pay | Admitting: Respiratory Therapy

## 2012-11-30 ENCOUNTER — Encounter (HOSPITAL_COMMUNITY): Payer: Self-pay

## 2012-11-30 ENCOUNTER — Encounter (HOSPITAL_COMMUNITY)
Admission: RE | Admit: 2012-11-30 | Discharge: 2012-11-30 | Disposition: A | Discharge status: Home / Self Care | Payer: Medicare Other | Source: Ambulatory Visit | Attending: Anesthesiology | Admitting: Anesthesiology

## 2012-11-30 ENCOUNTER — Encounter (HOSPITAL_COMMUNITY)
Admission: RE | Admit: 2012-11-30 | Discharge: 2012-11-30 | Disposition: A | Discharge status: Home / Self Care | Payer: Medicare Other | Source: Ambulatory Visit | Attending: Neurosurgery | Admitting: Neurosurgery

## 2012-11-30 DIAGNOSIS — Z01818 Encounter for other preprocedural examination: Secondary | ICD-10-CM | POA: Insufficient documentation

## 2012-11-30 DIAGNOSIS — Z01812 Encounter for preprocedural laboratory examination: Secondary | ICD-10-CM | POA: Insufficient documentation

## 2012-11-30 DIAGNOSIS — Z01810 Encounter for preprocedural cardiovascular examination: Secondary | ICD-10-CM | POA: Insufficient documentation

## 2012-11-30 LAB — BASIC METABOLIC PANEL
BUN: 26 mg/dL — ABNORMAL HIGH (ref 6–23)
CO2: 23 mEq/L (ref 19–32)
Calcium: 9.3 mg/dL (ref 8.4–10.5)
Chloride: 103 mEq/L (ref 96–112)
Creatinine, Ser: 1.05 mg/dL (ref 0.50–1.35)
GFR calc Af Amer: 77 mL/min — ABNORMAL LOW (ref >90)
GFR calc non Af Amer: 66 mL/min — ABNORMAL LOW (ref >90)
Glucose, Bld: 120 mg/dL — ABNORMAL HIGH (ref 70–99)
Potassium: 4.3 mEq/L (ref 3.5–5.1)
Sodium: 135 mEq/L (ref 135–145)

## 2012-11-30 LAB — CBC
HCT: 41.7 % (ref 39.0–52.0)
Hemoglobin: 14.6 g/dL (ref 13.0–17.0)
MCH: 33 pg (ref 26.0–34.0)
MCHC: 35 g/dL (ref 30.0–36.0)
MCV: 94.3 fL (ref 78.0–100.0)
Platelets: 250 10*3/uL (ref 150–400)
RBC: 4.42 MIL/uL (ref 4.22–5.81)
RDW: 14.6 % (ref 11.5–15.5)
WBC: 9.4 10*3/uL (ref 4.0–10.5)

## 2012-11-30 LAB — SURGICAL PCR SCREEN
MRSA, PCR: NEGATIVE
Staphylococcus aureus: POSITIVE — AB

## 2012-11-30 NOTE — Progress Notes (Signed)
Orders need to be signed by Dr Wynetta Emery, left voicemail with Shanda Bumps

## 2012-11-30 NOTE — Pre-Procedure Instructions (Signed)
EVEN BUDLONG  11/30/2012   Your procedure is scheduled on:  December 04, 2012  Report to [facility] Entrance A at 8 AM.  Call this number if you have problems the morning of surgery: [phone]   Remember:   Do not eat food or drink liquids after midnight.   Take these medicines the morning of surgery with A SIP OF WATER: albuterol (PROVENTIL HFA;VENTOLIN HFA) 108 (90 BASE)  MCG/ACT inhaler, fexofenadine (ALLEGRA), guaiFENesin (MUCINEX), omeprazole (PRILOSEC), venlafaxine (EFFEXOR)    Do not wear jewelry, make-up or nail polish.  Do not wear lotions, powders, or perfumes. You may wear deodorant.  Do not shave 48 hours prior to surgery. Men may shave face and neck.  Do not bring valuables to the hospital.  [facility] is not responsible for any belongings or valuables.               Contacts, dentures or bridgework may not be worn into surgery.  Leave suitcase in the car. After surgery it may be brought to your room.  For patients admitted to the hospital, discharge time is determined by your                treatment team.               Patients discharged the day of surgery will not be allowed to drive  home.  Name and phone number of your driver:   Special Instructions: Shower using CHG 2 nights before surgery and the night before surgery.  If you shower the day of surgery use CHG.  Use special wash - you have one bottle of CHG for all showers.  You should use approximately 1/3 of the bottle for each shower.   Please read over the following fact sheets that you were given: Pain Booklet, Coughing and Deep Breathing and Surgical Site Infection Prevention

## 2012-11-30 NOTE — Progress Notes (Addendum)
Anesthesia PAT Evaluation:  Patient is a 79 year old male scheduled for two level lumbar laminectomy/decompression on 12/04/12 by Dr. Wynetta Emery.  History include obesity (BMI 39.96), former smoker, GERD, arthritis, right rotator cuff repair 05/27/05, L2-3 decompression and foraminotomy 09/23/08, right THR with revision on 02/26/03, prior neck fusion (anterior and posterior) ~ '80's-'90, eye surgery.  PCP is  Dr. Synetta Fail. He reports a cardiac evaluation within the last year at [facility]) at [facility] due to a chronic heart "skip."  He also reported seeing a "kidney" specialist and allergist this year.  He doesn't know much about all the details.  He denies chest pain, SOB at rest, LE edema.  He gets chronic SOB with activities such as getting himself into his truck.  He is able to walk at least a block without significant SOB.  He does have a short, large neck with some limitation in neck mobility.  He denies known history of difficult intubation.  He has had all of his prior surgeries under [facility] except for his cholecystectomy was done under spinal anesthesia.  He had a remote history of being combative when waking from anesthesia, but believes he did okay with his last back surgery in 2010.  He did question about whether a spinal/epidural was an option for this procedure.  I did discuss that general anesthesia is done for most (if not all) of our lumbar surgeries.    Exam shows a pleasant white male in NAD.  He has a prominent belly.  His neck is short, large but with good mouth opening.  Lungs are clear.  Heart RRR, no murmur noted. No significant LE edema.  CXR on 11/30/12 showed cardiomegaly without acute disease.  Preoperative labs noted. BUN 26, Cr 1.05.  I'll follow-up once additional PCP and cardiology records received. I'll also request his 2010 anesthesia records.  Velna Ochs [facility]/Anesthesiology Phone [phone] 11/30/2012 2:48  PM  Addendum: 12/01/2012 10:30 AM Received PCP and cardiology notes.  He was seen by cardiologist Dr. Holley Raring on 06/06/12.  His note mentions that patient had only minimal CAD by a cath in 2001.    EKG on 03/08/12 showed SR, first degree AVB, occasional PVC, LAFB, LAE.  Echo on 06/12/12 showed normal LV systolic function with moderate LVH, LVEF 60%, moderate LAE, mild mitral annular calcification, mild MR, trace AR, trace TR with normal pulmonary pressure.  No change from 05/05/00.  Anesthesia records indicate he was intubated with a 7.5 ETT using a MAC 3 on 09/23/08.  Patient denied any acute cardiopulmonary symptoms and had cardiology evaluation earlier this year.  His labs and CXR appears acceptable for OR.  Anticipate that he can proceed as planned.

## 2012-12-04 ENCOUNTER — Encounter (HOSPITAL_COMMUNITY): Payer: Medicare Other | Admitting: Vascular Surgery

## 2012-12-04 ENCOUNTER — Inpatient Hospital Stay (HOSPITAL_COMMUNITY)
Admission: RE | Admit: 2012-12-04 | Discharge: 2012-12-05 | DRG: 520 | Disposition: A | Discharge status: Home / Self Care | Payer: Medicare Other | Source: Ambulatory Visit | Attending: Neurosurgery | Admitting: Neurosurgery

## 2012-12-04 ENCOUNTER — Inpatient Hospital Stay (HOSPITAL_COMMUNITY): Payer: Medicare Other

## 2012-12-04 ENCOUNTER — Inpatient Hospital Stay (HOSPITAL_COMMUNITY): Payer: Medicare Other | Admitting: Certified Registered"

## 2012-12-04 ENCOUNTER — Encounter (HOSPITAL_COMMUNITY)
Admission: RE | Disposition: A | Discharge status: Home / Self Care | Payer: Self-pay | Source: Ambulatory Visit | Attending: Neurosurgery

## 2012-12-04 ENCOUNTER — Encounter (HOSPITAL_COMMUNITY): Payer: Self-pay | Admitting: Surgery

## 2012-12-04 DIAGNOSIS — M1990 Unspecified osteoarthritis, unspecified site: Secondary | ICD-10-CM | POA: Diagnosis present

## 2012-12-04 DIAGNOSIS — Z96649 Presence of unspecified artificial hip joint: Secondary | ICD-10-CM

## 2012-12-04 DIAGNOSIS — Z87891 Personal history of nicotine dependence: Secondary | ICD-10-CM

## 2012-12-04 DIAGNOSIS — Z79899 Other long term (current) drug therapy: Secondary | ICD-10-CM

## 2012-12-04 DIAGNOSIS — K219 Gastro-esophageal reflux disease without esophagitis: Secondary | ICD-10-CM | POA: Diagnosis present

## 2012-12-04 DIAGNOSIS — Z880 Allergy status to penicillin: Secondary | ICD-10-CM

## 2012-12-04 DIAGNOSIS — Z6839 Body mass index (BMI) 39.0-39.9, adult: Secondary | ICD-10-CM

## 2012-12-04 DIAGNOSIS — M47817 Spondylosis without myelopathy or radiculopathy, lumbosacral region: Principal | ICD-10-CM | POA: Diagnosis present

## 2012-12-04 HISTORY — PX: LUMBAR LAMINECTOMY/DECOMPRESSION MICRODISCECTOMY: SHX5026

## 2012-12-04 SURGERY — LUMBAR LAMINECTOMY/DECOMPRESSION MICRODISCECTOMY 2 LEVELS
Anesthesia: General | Site: Back | Laterality: Bilateral | Wound class: Clean

## 2012-12-04 MED ORDER — PHENYLEPHRINE HCL 10 MG/ML IJ SOLN
INTRAMUSCULAR | Status: DC | PRN
  Administered 2012-12-04: 240 ug via INTRAVENOUS
  Administered 2012-12-04: 160 ug via INTRAVENOUS

## 2012-12-04 MED ORDER — 0.9 % SODIUM CHLORIDE (POUR BTL) OPTIME
TOPICAL | Status: DC | PRN
  Administered 2012-12-04: 1000 mL

## 2012-12-04 MED ORDER — CYCLOBENZAPRINE HCL 10 MG PO TABS
ORAL_TABLET | ORAL | Status: AC
  Filled 2012-12-04: qty 1

## 2012-12-04 MED ORDER — LIDOCAINE HCL (CARDIAC) 20 MG/ML IV SOLN
INTRAVENOUS | Status: DC | PRN
  Administered 2012-12-04: 60 mg via INTRAVENOUS

## 2012-12-04 MED ORDER — PANTOPRAZOLE SODIUM 40 MG PO TBEC
40.0000 mg | DELAYED_RELEASE_TABLET | Freq: Every day | ORAL | Status: DC
  Administered 2012-12-04: 40 mg via ORAL
  Filled 2012-12-04: qty 1

## 2012-12-04 MED ORDER — SODIUM CHLORIDE 0.9 % IJ SOLN: 3.0000 mL | INTRAMUSCULAR | Status: DC | PRN

## 2012-12-04 MED ORDER — SODIUM CHLORIDE 0.9 % IR SOLN
Status: DC | PRN
  Administered 2012-12-04: 10:00:00

## 2012-12-04 MED ORDER — VANCOMYCIN HCL IN DEXTROSE 1-5 GM/200ML-% IV SOLN
INTRAVENOUS | Status: AC
  Administered 2012-12-04: 1000 mg via INTRAVENOUS
  Filled 2012-12-04: qty 200

## 2012-12-04 MED ORDER — GLYCOPYRROLATE 0.2 MG/ML IJ SOLN
INTRAMUSCULAR | Status: DC | PRN
  Administered 2012-12-04: 0.6 mg via INTRAVENOUS

## 2012-12-04 MED ORDER — CYCLOBENZAPRINE HCL 10 MG PO TABS
10.0000 mg | ORAL_TABLET | Freq: Three times a day (TID) | ORAL | Status: DC | PRN
  Administered 2012-12-04: 10 mg via ORAL

## 2012-12-04 MED ORDER — ACETAMINOPHEN 325 MG PO TABS: 650.0000 mg | ORAL_TABLET | ORAL | Status: DC | PRN

## 2012-12-04 MED ORDER — THROMBIN 5000 UNITS EX SOLR
CUTANEOUS | Status: DC | PRN
  Administered 2012-12-04 (×3): 5000 [IU] via TOPICAL

## 2012-12-04 MED ORDER — HYDROMORPHONE HCL PF 1 MG/ML IJ SOLN
0.2500 mg | INTRAMUSCULAR | Status: DC | PRN
  Administered 2012-12-04 (×2): 0.5 mg via INTRAVENOUS

## 2012-12-04 MED ORDER — NEOSTIGMINE METHYLSULFATE 1 MG/ML IJ SOLN
INTRAMUSCULAR | Status: DC | PRN
  Administered 2012-12-04: 4 mg via INTRAVENOUS

## 2012-12-04 MED ORDER — ACETAMINOPHEN 650 MG RE SUPP: 650.0000 mg | RECTAL | Status: DC | PRN

## 2012-12-04 MED ORDER — VECURONIUM BROMIDE 10 MG IV SOLR
INTRAVENOUS | Status: DC | PRN
  Administered 2012-12-04: 2 mg via INTRAVENOUS

## 2012-12-04 MED ORDER — PHENOL 1.4 % MT LIQD: 1.0000 | OROMUCOSAL | Status: DC | PRN

## 2012-12-04 MED ORDER — ROCURONIUM BROMIDE 100 MG/10ML IV SOLN
INTRAVENOUS | Status: DC | PRN
  Administered 2012-12-04: 50 mg via INTRAVENOUS

## 2012-12-04 MED ORDER — ARTIFICIAL TEARS OP OINT
TOPICAL_OINTMENT | OPHTHALMIC | Status: DC | PRN
  Administered 2012-12-04: 1 via OPHTHALMIC

## 2012-12-04 MED ORDER — DOCUSATE SODIUM 100 MG PO CAPS
100.0000 mg | ORAL_CAPSULE | Freq: Two times a day (BID) | ORAL | Status: DC
  Administered 2012-12-04: 100 mg via ORAL
  Filled 2012-12-04: qty 1

## 2012-12-04 MED ORDER — FLUTICASONE PROPIONATE 50 MCG/ACT NA SUSP
1.0000 | Freq: Every day | NASAL | Status: DC
  Filled 2012-12-04: qty 16

## 2012-12-04 MED ORDER — HYDROMORPHONE HCL PF 1 MG/ML IJ SOLN
INTRAMUSCULAR | Status: AC
  Filled 2012-12-04: qty 1

## 2012-12-04 MED ORDER — HEMOSTATIC AGENTS (NO CHARGE) OPTIME
TOPICAL | Status: DC | PRN
  Administered 2012-12-04 (×2): 1 via TOPICAL

## 2012-12-04 MED ORDER — EPHEDRINE SULFATE 50 MG/ML IJ SOLN
INTRAMUSCULAR | Status: DC | PRN
  Administered 2012-12-04: 10 mg via INTRAVENOUS

## 2012-12-04 MED ORDER — MENTHOL 3 MG MT LOZG: 1.0000 | LOZENGE | OROMUCOSAL | Status: DC | PRN

## 2012-12-04 MED ORDER — FENTANYL CITRATE 0.05 MG/ML IJ SOLN
INTRAMUSCULAR | Status: DC | PRN
  Administered 2012-12-04: 100 ug via INTRAVENOUS

## 2012-12-04 MED ORDER — LACTATED RINGERS IV SOLN
INTRAVENOUS | Status: DC
  Administered 2012-12-04 (×2): via INTRAVENOUS

## 2012-12-04 MED ORDER — ONDANSETRON HCL 4 MG/2ML IJ SOLN: 4.0000 mg | INTRAMUSCULAR | Status: DC | PRN

## 2012-12-04 MED ORDER — VANCOMYCIN HCL IN DEXTROSE 1-5 GM/200ML-% IV SOLN
1000.0000 mg | Freq: Two times a day (BID) | INTRAVENOUS | Status: DC
  Administered 2012-12-04 – 2012-12-05 (×2): 1000 mg via INTRAVENOUS
  Filled 2012-12-04 (×3): qty 200

## 2012-12-04 MED ORDER — VENLAFAXINE HCL 37.5 MG PO TABS
37.5000 mg | ORAL_TABLET | Freq: Every day | ORAL | Status: DC
  Filled 2012-12-04 (×2): qty 1

## 2012-12-04 MED ORDER — SODIUM CHLORIDE 0.9 % IJ SOLN
3.0000 mL | Freq: Two times a day (BID) | INTRAMUSCULAR | Status: DC
  Administered 2012-12-04: 3 mL via INTRAVENOUS

## 2012-12-04 MED ORDER — ALUM & MAG HYDROXIDE-SIMETH 200-200-20 MG/5ML PO SUSP: 30.0000 mL | Freq: Four times a day (QID) | ORAL | Status: DC | PRN

## 2012-12-04 MED ORDER — GUAIFENESIN ER 600 MG PO TB12
1200.0000 mg | ORAL_TABLET | Freq: Two times a day (BID) | ORAL | Status: DC
  Administered 2012-12-04: 1200 mg via ORAL
  Filled 2012-12-04 (×3): qty 2

## 2012-12-04 MED ORDER — OXYCODONE HCL 5 MG PO TABS
ORAL_TABLET | ORAL | Status: AC
  Filled 2012-12-04: qty 1

## 2012-12-04 MED ORDER — LORATADINE 10 MG PO TABS
10.0000 mg | ORAL_TABLET | Freq: Every day | ORAL | Status: DC
  Filled 2012-12-04: qty 1

## 2012-12-04 MED ORDER — OXYCODONE HCL 5 MG/5ML PO SOLN: 5.0000 mg | Freq: Once | ORAL | Status: AC | PRN

## 2012-12-04 MED ORDER — ONDANSETRON HCL 4 MG/2ML IJ SOLN
INTRAMUSCULAR | Status: DC | PRN
  Administered 2012-12-04: 4 mg via INTRAVENOUS

## 2012-12-04 MED ORDER — PHENYLEPHRINE HCL 10 MG/ML IJ SOLN
10.0000 mg | INTRAVENOUS | Status: DC | PRN
  Administered 2012-12-04: 50 ug/min via INTRAVENOUS

## 2012-12-04 MED ORDER — ONDANSETRON HCL 4 MG/2ML IJ SOLN
4.0000 mg | Freq: Four times a day (QID) | INTRAMUSCULAR | Status: DC | PRN
Start: 2012-12-04 — End: 2012-12-04

## 2012-12-04 MED ORDER — LIDOCAINE-EPINEPHRINE 1 %-1:100000 IJ SOLN
INTRAMUSCULAR | Status: DC | PRN
  Administered 2012-12-04: 10 mL

## 2012-12-04 MED ORDER — SODIUM CHLORIDE 0.9 % IV SOLN: 250.0000 mL | INTRAVENOUS | Status: DC

## 2012-12-04 MED ORDER — OXYCODONE HCL 5 MG PO TABS
5.0000 mg | ORAL_TABLET | Freq: Once | ORAL | Status: AC | PRN
  Administered 2012-12-04: 5 mg via ORAL

## 2012-12-04 MED ORDER — MIDAZOLAM HCL 5 MG/5ML IJ SOLN
INTRAMUSCULAR | Status: DC | PRN
  Administered 2012-12-04: 1 mg via INTRAVENOUS

## 2012-12-04 MED ORDER — PROPOFOL 10 MG/ML IV BOLUS
INTRAVENOUS | Status: DC | PRN
  Administered 2012-12-04: 160 mg via INTRAVENOUS

## 2012-12-04 MED ORDER — ALBUTEROL SULFATE HFA 108 (90 BASE) MCG/ACT IN AERS
2.0000 | INHALATION_SPRAY | Freq: Four times a day (QID) | RESPIRATORY_TRACT | Status: DC | PRN
  Filled 2012-12-04: qty 6.7

## 2012-12-04 MED ORDER — HYDROMORPHONE HCL PF 1 MG/ML IJ SOLN
0.5000 mg | INTRAMUSCULAR | Status: DC | PRN
  Administered 2012-12-04 – 2012-12-05 (×3): 1 mg via INTRAVENOUS
  Filled 2012-12-04 (×3): qty 1

## 2012-12-04 MED ORDER — BUPIVACAINE HCL (PF) 0.25 % IJ SOLN
INTRAMUSCULAR | Status: DC | PRN
  Administered 2012-12-04: 10 mL

## 2012-12-04 SURGICAL SUPPLY — 60 items
ADH SKN CLS APL DERMABOND .7 (GAUZE/BANDAGES/DRESSINGS) ×1
APL SKNCLS STERI-STRIP NONHPOA (GAUZE/BANDAGES/DRESSINGS) ×1
BAG DECANTER FOR FLEXI CONT (MISCELLANEOUS) ×2 IMPLANT
BENZOIN TINCTURE PRP APPL 2/3 (GAUZE/BANDAGES/DRESSINGS) ×2 IMPLANT
BLADE SURG 11 STRL SS (BLADE) ×2 IMPLANT
BLADE SURG ROTATE 9660 (MISCELLANEOUS) IMPLANT
BRUSH SCRUB EZ PLAIN DRY (MISCELLANEOUS) ×2 IMPLANT
BUR MATCHSTICK NEURO 3.0 LAGG (BURR) ×2 IMPLANT
BUR PRECISION FLUTE 6.0 (BURR) ×2 IMPLANT
CANISTER SUCTION 2500CC (MISCELLANEOUS) ×2 IMPLANT
CONT SPEC 4OZ CLIKSEAL STRL BL (MISCELLANEOUS) ×2 IMPLANT
DECANTER SPIKE VIAL GLASS SM (MISCELLANEOUS) ×2 IMPLANT
DERMABOND ADVANCED (GAUZE/BANDAGES/DRESSINGS) ×1
DERMABOND ADVANCED .7 DNX12 (GAUZE/BANDAGES/DRESSINGS) ×1 IMPLANT
DRAPE LAPAROTOMY 100X72X124 (DRAPES) ×2 IMPLANT
DRAPE MICROSCOPE ZEISS OPMI (DRAPES) ×2 IMPLANT
DRAPE POUCH INSTRU U-SHP 10X18 (DRAPES) ×2 IMPLANT
DRAPE PROXIMA HALF (DRAPES) IMPLANT
DRAPE SURG 17X23 STRL (DRAPES) ×2 IMPLANT
DRSG OPSITE 4X5.5 SM (GAUZE/BANDAGES/DRESSINGS) ×2 IMPLANT
DRSG OPSITE POSTOP 4X6 (GAUZE/BANDAGES/DRESSINGS) ×1 IMPLANT
DURAPREP 26ML APPLICATOR (WOUND CARE) ×2 IMPLANT
ELECT BLADE 4.0 EZ CLEAN MEGAD (MISCELLANEOUS) ×2
ELECT REM PT RETURN 9FT ADLT (ELECTROSURGICAL) ×2
ELECTRODE BLDE 4.0 EZ CLN MEGD (MISCELLANEOUS) IMPLANT
ELECTRODE REM PT RTRN 9FT ADLT (ELECTROSURGICAL) ×1 IMPLANT
EVACUATOR 1/8 PVC DRAIN (DRAIN) ×1 IMPLANT
GAUZE SPONGE 4X4 16PLY XRAY LF (GAUZE/BANDAGES/DRESSINGS) IMPLANT
GLOVE BIO SURGEON STRL SZ8 (GLOVE) ×2 IMPLANT
GLOVE ECLIPSE 7.5 STRL STRAW (GLOVE) IMPLANT
GLOVE ECLIPSE 8.5 STRL (GLOVE) ×1 IMPLANT
GLOVE EXAM NITRILE LRG STRL (GLOVE) ×1 IMPLANT
GLOVE EXAM NITRILE MD LF STRL (GLOVE) IMPLANT
GLOVE EXAM NITRILE XL STR (GLOVE) IMPLANT
GLOVE EXAM NITRILE XS STR PU (GLOVE) IMPLANT
GLOVE INDICATOR 8.5 STRL (GLOVE) ×3 IMPLANT
GLOVE SURG SS PI 8.0 STRL IVOR (GLOVE) ×2 IMPLANT
GOWN BRE IMP SLV AUR LG STRL (GOWN DISPOSABLE) ×2 IMPLANT
GOWN BRE IMP SLV AUR XL STRL (GOWN DISPOSABLE) ×3 IMPLANT
GOWN STRL REIN 2XL LVL4 (GOWN DISPOSABLE) ×2 IMPLANT
HEMOSTAT POWDER KIT SURGIFOAM (HEMOSTASIS) ×1 IMPLANT
KIT BASIN OR (CUSTOM PROCEDURE TRAY) ×2 IMPLANT
KIT ROOM TURNOVER OR (KITS) ×2 IMPLANT
NDL SPNL 22GX3.5 QUINCKE BK (NEEDLE) ×1 IMPLANT
NEEDLE HYPO 22GX1.5 SAFETY (NEEDLE) ×2 IMPLANT
NEEDLE SPNL 22GX3.5 QUINCKE BK (NEEDLE) ×2 IMPLANT
NS IRRIG 1000ML POUR BTL (IV SOLUTION) ×2 IMPLANT
PACK LAMINECTOMY NEURO (CUSTOM PROCEDURE TRAY) ×2 IMPLANT
RUBBERBAND STERILE (MISCELLANEOUS) ×4 IMPLANT
SPONGE GAUZE 4X4 12PLY (GAUZE/BANDAGES/DRESSINGS) ×2 IMPLANT
SPONGE SURGIFOAM ABS GEL SZ50 (HEMOSTASIS) ×2 IMPLANT
STRIP CLOSURE SKIN 1/2X4 (GAUZE/BANDAGES/DRESSINGS) ×2 IMPLANT
SUT VIC AB 0 CT1 18XCR BRD8 (SUTURE) ×1 IMPLANT
SUT VIC AB 0 CT1 8-18 (SUTURE) ×2
SUT VIC AB 2-0 CT1 18 (SUTURE) ×2 IMPLANT
SUT VICRYL 4-0 PS2 18IN ABS (SUTURE) ×2 IMPLANT
SYR 20ML ECCENTRIC (SYRINGE) ×2 IMPLANT
TOWEL OR 17X24 6PK STRL BLUE (TOWEL DISPOSABLE) ×2 IMPLANT
TOWEL OR 17X26 10 PK STRL BLUE (TOWEL DISPOSABLE) ×2 IMPLANT
WATER STERILE IRR 1000ML POUR (IV SOLUTION) ×2 IMPLANT

## 2012-12-04 NOTE — Progress Notes (Signed)
ANTIBIOTIC CONSULT NOTE - INITIAL  Pharmacy Consult for vancomycin Indication: surgical prophylaxis.  Allergies  Allergen Reactions  . Penicillins Swelling    Patient Measurements: Height: 5\' 5"  (165.1 cm) Weight: 240 lb 2 oz (108.92 kg) IBW/kg (Calculated) : 61.5  Vital Signs: Temp: 97.4 F (36.3 C) (10/20 1330) Temp src: Oral (10/20 0816) BP: 163/67 mmHg (10/20 1330) Pulse Rate: 72 (10/20 1330) Intake/Output from previous day:   Intake/Output from this shift: Total I/O In: 1000 [I.V.:1000] Out: 100 [Blood:100]  Labs: No results found for this basename: WBC, HGB, PLT, LABCREA, CREATININE,  in the last 72 hours Estimated Creatinine Clearance: 67.1 ml/min (by C-G formula based on Cr of 1.05). No results found for this basename: VANCOTROUGH, Leodis Binet, VANCORANDOM, GENTTROUGH, GENTPEAK, GENTRANDOM, TOBRATROUGH, TOBRAPEAK, TOBRARND, AMIKACINPEAK, AMIKACINTROU, AMIKACIN,  in the last 72 hours   Microbiology: Recent Results (from the past 720 hour(s))  SURGICAL PCR SCREEN     Status: Abnormal   Collection Time    11/30/12  1:41 PM      Result Value Range Status   MRSA, PCR NEGATIVE  NEGATIVE Final   Staphylococcus aureus POSITIVE (*) NEGATIVE Final   Comment:            The Xpert SA Assay (FDA     approved for NASAL specimens     in patients over 67 years of age),     is one component of     a comprehensive surveillance     program.  Test performance has     been validated by [employer] for patients greater     than or equal to 47 year old.     It is not intended     to diagnose infection nor to     guide or monitor treatment.    Medical History: Past Medical History  Diagnosis Date  . GERD (gastroesophageal reflux disease)   . Degenerative arthritis   . Constipation   . Seasonal allergies      Assessment: 45 YOM s/p decompressive lumbar laminectomy L4-5 and L5-S1, with posterior hemovac, allergic to penicillin. Pharmacy is consulted to dose  vancomycin for surgical prophylaxis. Pre-op vancomycin 1g was given at ~ 1030. Scr 1.05 on 10/16, est. crcl 65 ml/min. Afebrile, MRSA PCR positive.   Goal of Therapy:  Vancomycin trough level 15-20 mcg/ml  Plan:  - Start vancomycin 1g IV Q 12hrs, first dose 1800 given earlier since pre-op dose is not enough as loading dose. - f/u drain status, and clinical course - vancomycin trough if continues for > 72 hrs  Bayard Hugger, PharmD, BCPS  Clinical Pharmacist  Pager: [phone]   12/04/2012,2:31 PM

## 2012-12-04 NOTE — H&P (Signed)
Larry Church is an 79 y.o. male.   Chief Complaint: Back and bilateral leg pain right greater left HPI: Patient is a very pleasant 79 year old some is a progress worsening back and bilateral leg pain worse in the right ring to his buttock down the posterior aspect of both thighs posterior lateral calf and into his ankles in his feet. It is worse on the right. It has been refractory to all forms of conservative treatment. Imaging revealed severe spinal stenosis at L4-5 and L5-S1 with progressive deterioration degeneration lateral recess stenosis bilaterally. Due to his failure conservative treatment imaging findings and progression of clinical syndrome I recommended a decompressive laminectomies at L4-5 and L5-S1 I extensively went over the risks and benefits of the operation with the patient as well as perioperative course and expectations of outcome alternatives of surgery he understood and agreed to proceed forward.  Past Medical History  Diagnosis Date  . GERD (gastroesophageal reflux disease)   . Degenerative arthritis   . Constipation   . Seasonal allergies     Past Surgical History  Procedure Laterality Date  . Hip surgery    . Back surgery    . Neck surgery    . Joint replacement      hip  . Eye surgery      right    History reviewed. No pertinent family history. Social History:  reports that he quit smoking about 52 years ago. He does not have any smokeless tobacco history on file. He reports that he does not drink alcohol or use illicit drugs.  Allergies:  Allergies  Allergen Reactions  . Penicillins Swelling    Medications Prior to Admission  Medication Sig Dispense Refill  . albuterol (PROVENTIL HFA;VENTOLIN HFA) 108 (90 BASE) MCG/ACT inhaler Inhale 2 puffs into the lungs every 6 (six) hours as needed for wheezing.      . fexofenadine (ALLEGRA) 180 MG tablet Take 180 mg by mouth every other day.       Marland Kitchen guaiFENesin (MUCINEX) 600 MG 12 hr tablet Take 1,200 mg by mouth  2 (two) times daily.      . mometasone (NASONEX) 50 MCG/ACT nasal spray Place 2 sprays into the nose daily.      Marland Kitchen omeprazole (PRILOSEC) 40 MG capsule Take 40 mg by mouth every evening.      . venlafaxine (EFFEXOR) 37.5 MG tablet Take 37.5 mg by mouth daily.        No results found for this or any previous visit (from the past 48 hour(s)). No results found.  Review of Systems  Constitutional: Negative.   HENT: Negative.   Eyes: Negative.   Respiratory: Negative.   Cardiovascular: Negative.   Gastrointestinal: Negative.   Genitourinary: Negative.   Musculoskeletal: Positive for back pain, joint pain and myalgias.  Skin: Negative.   Neurological: Positive for tingling.  Endo/Heme/Allergies: Negative.   Psychiatric/Behavioral: Negative.     Blood pressure 177/67, pulse 49, temperature 97.1 F (36.2 C), temperature source Oral, resp. rate 18, SpO2 97.00%. Physical Exam  Constitutional: He is oriented to person, place, and time. He appears well-developed.  HENT:  Head: Normocephalic.  Eyes: Pupils are equal, round, and reactive to light.  Neck: Normal range of motion.  Cardiovascular: Normal rate.   Respiratory: Effort normal.  GI: Soft. Bowel sounds are normal.  Musculoskeletal: Normal range of motion.  Neurological: He is alert and oriented to person, place, and time. He has normal strength. GCS eye subscore is 4. GCS verbal subscore  is 5. GCS motor subscore is 6.  Reflex Scores:      Patellar reflexes are 0 on the right side and 0 on the left side.      Achilles reflexes are 0 on the right side and 0 on the left side. Strength is 5 out of 5 in his iliopsoas, quads, hip she's, gastrocs, into tibialis, and EHL.  Skin: Skin is warm.     Assessment/Plan 79 evidence for a decompressive lumbar laminectomy L4-5 and L5-S1   Larry Church P 12/04/2012, 10:02 AM

## 2012-12-04 NOTE — Progress Notes (Signed)
UR completed. Rhyanna Sorce RN CCM Case Mgmt phone [phone] 

## 2012-12-04 NOTE — Preoperative (Signed)
Beta Blockers   Reason not to administer Beta Blockers:Not Applicable 

## 2012-12-04 NOTE — Anesthesia Preprocedure Evaluation (Addendum)
Anesthesia Evaluation  Patient identified by MRN, date of birth, ID band Patient awake    Reviewed: Allergy & Precautions, H&P , NPO status , Patient's Chart, lab work & pertinent test results  Airway Mallampati: II  Neck ROM: full    Dental  (+) Teeth Intact and Dental Advisory Given   Pulmonary former smoker,          Cardiovascular Rhythm:Regular Rate:Normal     Neuro/Psych    GI/Hepatic GERD-  ,  Endo/Other  Morbid obesity  Renal/GU      Musculoskeletal   Abdominal   Peds  Hematology   Anesthesia Other Findings   Reproductive/Obstetrics                          Anesthesia Physical Anesthesia Plan  ASA: II  Anesthesia Plan: General   Post-op Pain Management:    Induction: Intravenous  Airway Management Planned: Oral ETT  Additional Equipment:   Intra-op Plan:   Post-operative Plan: Extubation in OR  Informed Consent: I have reviewed the patients History and Physical, chart, labs and discussed the procedure including the risks, benefits and alternatives for the proposed anesthesia with the patient or authorized representative who has indicated his/her understanding and acceptance.     Plan Discussed with: CRNA, Anesthesiologist and Surgeon  Anesthesia Plan Comments:         Anesthesia Quick Evaluation

## 2012-12-04 NOTE — Anesthesia Procedure Notes (Signed)
Procedure Name: Intubation Date/Time: 12/04/2012 10:23 AM Performed by: Jefm Miles E Pre-anesthesia Checklist: Patient identified, Emergency Drugs available, Suction available, Patient being monitored and Timeout performed Patient Re-evaluated:Patient Re-evaluated prior to inductionOxygen Delivery Method: Circle system utilized Preoxygenation: Pre-oxygenation with 100% oxygen Intubation Type: IV induction Ventilation: Oral airway inserted - appropriate to patient size and Mask ventilation without difficulty Laryngoscope Size: Mac and 3 Grade View: Grade I Tube type: Oral Tube size: 7.5 mm Number of attempts: 1 Airway Equipment and Method: Stylet Placement Confirmation: ETT inserted through vocal cords under direct vision,  positive ETCO2 and breath sounds checked- equal and bilateral Secured at: 22 cm Tube secured with: Tape Dental Injury: Teeth and Oropharynx as per pre-operative assessment

## 2012-12-04 NOTE — Addendum Note (Signed)
Addendum created 12/04/12 1330 by De Nurse, CRNA   Modules edited: Anesthesia Events, Anesthesia Flowsheet

## 2012-12-04 NOTE — Anesthesia Postprocedure Evaluation (Signed)
Anesthesia Post Note  Patient: Larry Church  Procedure(s) Performed: Procedure(s) (LRB): BILATERAL LUMBAR LAMINECTOMY/DECOMPRESSION MICRODISCECTOMY LUMBAR FOUR-TO FIVE SACRALONE (Bilateral)  Anesthesia type: General  Patient location: PACU  Post pain: Pain level controlled and Adequate analgesia  Post assessment: Post-op Vital signs reviewed, Patient's Cardiovascular Status Stable, Respiratory Function Stable, Patent Airway and Pain level controlled  Last Vitals:  Filed Vitals:   12/04/12 1231  BP: 125/64  Pulse:   Temp:   Resp:     Post vital signs: Reviewed and stable  Level of consciousness: awake, alert  and oriented  Complications: No apparent anesthesia complications

## 2012-12-04 NOTE — Transfer of Care (Signed)
Immediate Anesthesia Transfer of Care Note  Patient: Larry Church  Procedure(s) Performed: Procedure(s): BILATERAL LUMBAR LAMINECTOMY/DECOMPRESSION MICRODISCECTOMY LUMBAR FOUR-TO FIVE SACRALONE (Bilateral)  Patient Location: PACU  Anesthesia Type:General  Level of Consciousness: awake, alert  and oriented  Airway & Oxygen Therapy: Patient Spontanous Breathing and Patient connected to nasal cannula oxygen  Post-op Assessment: Report given to PACU RN and Patient moving all extremities X 4  Post vital signs: Reviewed and stable  Complications: No apparent anesthesia complications

## 2012-12-04 NOTE — Progress Notes (Signed)
Nurse called Herbert Seta in Neuro OR and informed her that Dr. Wynetta Emery had orders under "signed and held." [facility] informed Nurse that she would "tell him."

## 2012-12-04 NOTE — Op Note (Signed)
Preoperative diagnosis: Lumbar spinal stenosis lateral recess stenosis and bilateral L4 and L5 and S1 radiculopathies  Postoperative diagnosis: Same  Procedure: Decompressive lumbar laminectomy L4-5 L5-S1 bilateral with partial medial facetectomies and foraminotomies of the L4, L5 and S1 neural foramen.  Surgeon: Jillyn Hidden Gianluca Chhim  Assistant: Barnett Abu  Anesthesia: Gen.  EBL: Minimal  History of present illness: This patient is a very pleasant 79 year old 7 who presented with back and bilateral leg pain radiating into his hips and down the backs and outside of his thighs back and outside of his calves into his feet this is been refractory to all forms of conservative treatment imaging revealed severe degenerative disease at multilevels with severe facet arthropathy and lateral recess stenosis at L4-5 and L5-S1 was in impingement of the L4, L5, and S1 nerve roots. Due to his failure conservative treatment imaging findings and progression of clinical syndrome I recommended a bilateral decompressive laminectomy and foraminotomies of the L4-L5 and S1 nerve roots. I extensively reviewed the risks and benefits of the operation the patient as well as perioperative course expectations about alternatives of surgery and he understood and agreed to proceed forward.  Operative procedure: Patient brought into the or was induced a general anesthesia positioned prone the Wilson frame his back was prepped and draped in routine sterile fashion. The inferior aspect of his old incision was opened up and extended caudally subperiosteal dissections care lamina of L4-L5 and S1 bilaterally interoperative X. identify the L4-5 disc space and the L5 pedicle so than the spinous processes at L4 and L5 were removed central decompression was begun partial medial facetectomies were then performed using a combination of a high-speed drill and a 3 and 4 mm Kerrison punch. Immediately identified at just inferior to the L4-5 disc space at  the level of the L5 pedicle was a both still dilatation of the dura secondary to severe lateral recess stenosis at that level. The large osteophyte coming off the medial aspect of the facet complex was dissected off of the dura with a 4 Penfield and removed in piecemeal fashion with him a Kerrison punch. This significantly decompressed the central canal as well as opened up the L5 foramen. Marking both cephalad and caudally the undersurface of the L4 foramen was identified and the super aspect the S1 foramen was identified all III nerve roots on each side were decompressed all 3 foramina were widely patent easily excepting a coronary.and hockey-stick. There is marked facet arthropathy and ligamentous hypertrophy intervening to severe multifactorial stenosis. At the end of the decompression is no further stenosis was closely irrigated meticulous hemostasis was maintained Gelfoam was overlaid top of the dura a medium Hemovac drain was placed and the wounds closed in layers with after Vicryl the skin was closed running 4 subcuticular benzoin and Steri-Strips were applied patient recovered in stable condition. At the of the case all needle counts sponge counts were correct.

## 2012-12-05 MED ORDER — MUPIROCIN 2 % EX OINT: TOPICAL_OINTMENT | Freq: Two times a day (BID) | CUTANEOUS | Status: DC

## 2012-12-05 MED ORDER — CYCLOBENZAPRINE HCL 10 MG PO TABS: 10.0000 mg | ORAL_TABLET | Freq: Three times a day (TID) | ORAL | Status: DC | PRN

## 2012-12-05 NOTE — Progress Notes (Signed)
Pt and wife given D/C instructions with Rx, verbal understanding given. All questions answered prior to D/C. Pt D/C'd home via wheelchair @ [phone] per MD order. Rema Fendt, RN

## 2012-12-05 NOTE — Progress Notes (Signed)
Patient ID: Larry Church, male   DOB: Nov 16, 1932, 79 y.o.   MRN: [id number] Significant improvement preoperative radicular pain ambulating voiding

## 2012-12-05 NOTE — Discharge Summary (Signed)
  Physician Discharge Summary  Patient ID: Larry Church MRN: [id number] DOB/AGE: Sep 01, 1932 79 y.o.  Admit date: 12/04/2012 Discharge date: 12/05/2012  Admission Diagnoses: Lumbar spinal stenosis L4-5 L5-S1  Discharge Diagnoses: Same Active Problems:   * No active hospital problems. *   Discharged Condition: good  Hospital Course: Patient admitted hospital underwent decompressive laminectomy L4-5 L5-S1 postop patient did very well significant room in preoperative pain was ambulating and voiding spontaneously tolerating regular diet was stable for discharge home.  Consults: Significant Diagnostic Studies: Treatments: Decompressive lumbar laminectomy L4-5 L5-S1 bilateral Discharge Exam: Blood pressure 121/77, pulse 47, temperature 98.9 F (37.2 C), temperature source Oral, resp. rate 18, height 5\' 5"  (1.651 m), weight 108.92 kg (240 lb 2 oz), SpO2 93.00%. Strength out of 5 wound clean and dry  Disposition: Home     Medication List         albuterol 108 (90 BASE) MCG/ACT inhaler  Commonly known as:  PROVENTIL HFA;VENTOLIN HFA  Inhale 2 puffs into the lungs every 6 (six) hours as needed for wheezing.     cyclobenzaprine 10 MG tablet  Commonly known as:  FLEXERIL  Take 1 tablet (10 mg total) by mouth 3 (three) times daily as needed for muscle spasms.     fexofenadine 180 MG tablet  Commonly known as:  ALLEGRA  Take 180 mg by mouth every other day.     guaiFENesin 600 MG 12 hr tablet  Commonly known as:  MUCINEX  Take 1,200 mg by mouth 2 (two) times daily.     mometasone 50 MCG/ACT nasal spray  Commonly known as:  NASONEX  Place 2 sprays into the nose daily.     omeprazole 40 MG capsule  Commonly known as:  PRILOSEC  Take 40 mg by mouth every evening.     venlafaxine 37.5 MG tablet  Commonly known as:  EFFEXOR  Take 37.5 mg by mouth daily.           Follow-up Information   Follow up with [facility] P, MD.   Specialty:  Neurosurgery   Contact  information:   [address] [place] [address] [phone]       Signed: Dorothy Polhemus P 12/05/2012, 7:20 AM

## 2012-12-06 ENCOUNTER — Emergency Department (HOSPITAL_COMMUNITY): Payer: Medicare Other

## 2012-12-06 ENCOUNTER — Encounter (HOSPITAL_COMMUNITY): Payer: Self-pay | Admitting: Neurosurgery

## 2012-12-06 ENCOUNTER — Emergency Department (HOSPITAL_COMMUNITY)
Admission: EM | Admit: 2012-12-06 | Discharge: 2012-12-06 | Disposition: A | Discharge status: Home / Self Care | Payer: Medicare Other | Attending: Emergency Medicine | Admitting: Emergency Medicine

## 2012-12-06 DIAGNOSIS — Z79899 Other long term (current) drug therapy: Secondary | ICD-10-CM | POA: Insufficient documentation

## 2012-12-06 DIAGNOSIS — Z7982 Long term (current) use of aspirin: Secondary | ICD-10-CM | POA: Insufficient documentation

## 2012-12-06 DIAGNOSIS — R338 Other retention of urine: Secondary | ICD-10-CM

## 2012-12-06 DIAGNOSIS — M1990 Unspecified osteoarthritis, unspecified site: Secondary | ICD-10-CM | POA: Insufficient documentation

## 2012-12-06 DIAGNOSIS — R339 Retention of urine, unspecified: Secondary | ICD-10-CM | POA: Insufficient documentation

## 2012-12-06 DIAGNOSIS — Z880 Allergy status to penicillin: Secondary | ICD-10-CM | POA: Insufficient documentation

## 2012-12-06 DIAGNOSIS — Z87891 Personal history of nicotine dependence: Secondary | ICD-10-CM | POA: Insufficient documentation

## 2012-12-06 DIAGNOSIS — K219 Gastro-esophageal reflux disease without esophagitis: Secondary | ICD-10-CM | POA: Insufficient documentation

## 2012-12-06 LAB — COMPREHENSIVE METABOLIC PANEL
ALT: 17 U/L (ref 0–53)
AST: 19 U/L (ref 0–37)
Albumin: 3.2 g/dL — ABNORMAL LOW (ref 3.5–5.2)
Alkaline Phosphatase: 79 U/L (ref 39–117)
BUN: 18 mg/dL (ref 6–23)
CO2: 26 mEq/L (ref 19–32)
Calcium: 9.6 mg/dL (ref 8.4–10.5)
Chloride: 97 mEq/L (ref 96–112)
Creatinine, Ser: 1.09 mg/dL (ref 0.50–1.35)
GFR calc Af Amer: 74 mL/min — ABNORMAL LOW (ref >90)
GFR calc non Af Amer: 63 mL/min — ABNORMAL LOW (ref >90)
Glucose, Bld: 126 mg/dL — ABNORMAL HIGH (ref 70–99)
Potassium: 4.2 mEq/L (ref 3.5–5.1)
Sodium: 132 mEq/L — ABNORMAL LOW (ref 135–145)
Total Bilirubin: 0.8 mg/dL (ref 0.3–1.2)
Total Protein: 7.2 g/dL (ref 6.0–8.3)

## 2012-12-06 LAB — URINALYSIS, ROUTINE W REFLEX MICROSCOPIC
Bilirubin Urine: NEGATIVE
Glucose, UA: NEGATIVE mg/dL
Hgb urine dipstick: NEGATIVE
Ketones, ur: NEGATIVE mg/dL
Leukocytes, UA: NEGATIVE
Nitrite: NEGATIVE
Protein, ur: NEGATIVE mg/dL
Specific Gravity, Urine: 1.012 (ref 1.005–1.030)
Urobilinogen, UA: 0.2 mg/dL (ref 0.0–1.0)
pH: 5 (ref 5.0–8.0)

## 2012-12-06 LAB — CBC WITH DIFFERENTIAL/PLATELET
Basophils Absolute: 0 10*3/uL (ref 0.0–0.1)
Basophils Relative: 0 % (ref 0–1)
Eosinophils Absolute: 0.2 10*3/uL (ref 0.0–0.7)
Eosinophils Relative: 1 % (ref 0–5)
HCT: 37.3 % — ABNORMAL LOW (ref 39.0–52.0)
Hemoglobin: 12.8 g/dL — ABNORMAL LOW (ref 13.0–17.0)
Lymphocytes Relative: 8 % — ABNORMAL LOW (ref 12–46)
Lymphs Abs: 1.1 10*3/uL (ref 0.7–4.0)
MCH: 32.7 pg (ref 26.0–34.0)
MCHC: 34.3 g/dL (ref 30.0–36.0)
MCV: 95.2 fL (ref 78.0–100.0)
Monocytes Absolute: 1.7 10*3/uL — ABNORMAL HIGH (ref 0.1–1.0)
Monocytes Relative: 12 % (ref 3–12)
Neutro Abs: 12 10*3/uL — ABNORMAL HIGH (ref 1.7–7.7)
Neutrophils Relative %: 80 % — ABNORMAL HIGH (ref 43–77)
Platelets: 229 10*3/uL (ref 150–400)
RBC: 3.92 MIL/uL — ABNORMAL LOW (ref 4.22–5.81)
RDW: 14.6 % (ref 11.5–15.5)
WBC: 15 10*3/uL — ABNORMAL HIGH (ref 4.0–10.5)

## 2012-12-06 MED ORDER — SENNOSIDES-DOCUSATE SODIUM 8.6-50 MG PO TABS: 2.0000 | ORAL_TABLET | Freq: Every day | ORAL | Status: AC

## 2012-12-06 NOTE — ED Notes (Signed)
Pt belongings placed in belongings bag at bedside. Items include plaid shirt khaki jacket and black hat. Pts silver walker also in the room. Placed pt label on walker.

## 2012-12-06 NOTE — ED Notes (Signed)
Pt and pt's wife comfortable with d/c and f/u instructions. Prescriptions x1.

## 2012-12-06 NOTE — ED Provider Notes (Signed)
CSN: [id number]     Arrival date & time 12/06/12  1423 History   First MD Initiated Contact with Patient 12/06/12 1506     Chief Complaint  Patient presents with  . Urinary Retention   (Consider location/radiation/quality/duration/timing/severity/associated sxs/prior Treatment) HPI Patient presents with concern of decreased urine production, decreased bowel movements. Patient was discharged yesterday from this facility following elective surgical repair of lumbar vertebrae. Patient notes that prior to discharge she was able to void, and defecate.  Hours, since that time he has had diminished urine production, and no bowel movements. Rhythm concurrent increase in abdominal pain, anterior, nonradiating, sore. There is generalized discomfort, but no fever, no chills, no vomiting, no diarrhea.  Past Medical History  Diagnosis Date  . GERD (gastroesophageal reflux disease)   . Degenerative arthritis   . Constipation   . Seasonal allergies    Past Surgical History  Procedure Laterality Date  . Hip surgery    . Back surgery    . Neck surgery    . Joint replacement      hip  . Eye surgery      right  . Lumbar laminectomy/decompression microdiscectomy Bilateral 12/04/2012    Procedure: BILATERAL LUMBAR LAMINECTOMY/DECOMPRESSION MICRODISCECTOMY LUMBAR FOUR-TO FIVE SACRALONE;  Surgeon: Mariam Dollar, MD;  Location: [facility];  Service: Neurosurgery;  Laterality: Bilateral;   History reviewed. No pertinent family history. History  Substance Use Topics  . Smoking status: Former Smoker    Quit date: 02/16/1960  . Smokeless tobacco: Not on file  . Alcohol Use: No    Review of Systems  Constitutional:       Per HPI, otherwise negative  HENT:       Per HPI, otherwise negative  Respiratory:       Per HPI, otherwise negative  Cardiovascular:       Per HPI, otherwise negative  Gastrointestinal: Negative for vomiting.  Endocrine:       Negative aside from HPI  Genitourinary:    Neg aside from HPI   Musculoskeletal:       Per HPI, otherwise negative  Skin: Negative.   Neurological: Negative for syncope.    Allergies  Penicillins  Home Medications   Current Outpatient Rx  Name  Route  Sig  Dispense  Refill  . albuterol (PROVENTIL HFA;VENTOLIN HFA) 108 (90 BASE) MCG/ACT inhaler   Inhalation   Inhale 2 puffs into the lungs every 6 (six) hours as needed for wheezing.         Marland Kitchen aspirin EC 81 MG tablet   Oral   Take 81 mg by mouth daily.         . cyclobenzaprine (FLEXERIL) 10 MG tablet   Oral   Take 1 tablet (10 mg total) by mouth 3 (three) times daily as needed for muscle spasms.   80 tablet   1   . fexofenadine (ALLEGRA) 180 MG tablet   Oral   Take 180 mg by mouth every other day.          Marland Kitchen guaiFENesin (MUCINEX) 600 MG 12 hr tablet   Oral   Take 1,200 mg by mouth 2 (two) times daily.         Marland Kitchen HYDROcodone-acetaminophen (NORCO/VICODIN) 5-325 MG per tablet   Oral   Take 1 tablet by mouth every 4 (four) hours as needed. For pain         . mometasone (NASONEX) 50 MCG/ACT nasal spray   Nasal   Place 2 sprays  into the nose daily.         Marland Kitchen omeprazole (PRILOSEC) 40 MG capsule   Oral   Take 40 mg by mouth every evening.         . venlafaxine (EFFEXOR) 37.5 MG tablet   Oral   Take 37.5 mg by mouth daily.          BP 122/58  Pulse 103  Temp(Src) 98.8 F (37.1 C) (Oral)  Resp 16  Wt 240 lb (108.863 kg)  BMI 39.94 kg/m2  SpO2 97% Physical Exam  Nursing note and vitals reviewed. Constitutional: He is oriented to person, place, and time. He appears well-developed. No distress.  HENT:  Head: Normocephalic and atraumatic.  Eyes: Conjunctivae and EOM are normal.  Cardiovascular: Normal rate and regular rhythm.   Pulmonary/Chest: Effort normal. No stridor. No respiratory distress.  Abdominal: He exhibits no distension.    Musculoskeletal: He exhibits no edema.       Arms: Neurological: He is alert and oriented to person,  place, and time.  Skin: Skin is warm and dry.  Psychiatric: He has a normal mood and affect.    ED Course  BLADDER CATHETERIZATION Date/Time: 12/06/2012 4:11 PM Performed by: Gerhard Munch Authorized by: Gerhard Munch Consent: Verbal consent obtained. The procedure was performed in an emergent situation. Risks and benefits: risks, benefits and alternatives were discussed Consent given by: patient Patient understanding: patient states understanding of the procedure being performed Patient consent: the patient's understanding of the procedure matches consent given Procedure consent: procedure consent matches procedure scheduled Relevant documents: relevant documents present and verified Test results: test results available and properly labeled Site marked: the operative site was marked Imaging studies: imaging studies available Required items: required blood products, implants, devices, and special equipment available Patient identity confirmed: verbally with patient Indications: urinary retention Local anesthesia used: no Patient sedated: no Preparation: Patient was prepped and draped in the usual sterile fashion. Catheter insertion: temporary indwelling Catheter size: 16 Fr Complicated insertion: no Altered anatomy: no Bladder irrigation: no Number of attempts: 1 Aspirate amount (ml): >500. Urine characteristics: clear Patient tolerance: Patient tolerated the procedure well with no immediate complications.   (including critical care time) Labs Review Labs Reviewed  CBC WITH DIFFERENTIAL - Abnormal; Notable for the following:    WBC 15.0 (*)    RBC 3.92 (*)    Hemoglobin 12.8 (*)    HCT 37.3 (*)    Neutrophils Relative % 80 (*)    Neutro Abs 12.0 (*)    Lymphocytes Relative 8 (*)    Monocytes Absolute 1.7 (*)    All other components within normal limits  URINALYSIS, ROUTINE W REFLEX MICROSCOPIC  COMPREHENSIVE METABOLIC PANEL   Imaging Review No results  found.  EKG Interpretation   None      Prior to the initial evaluation her to the patient's chart, including surgical notes.   5:32 PM Patient requests discharge. He has no new complaints, no new abnormal vital signs. I discussed all findings with him and his wife.  We discussed risks and benefits of temporary Foley catheter with leg bag versus discharge with no intervention.  Patient is to discharge with Foley catheter in place.  Return precautions, and urology followup instructions provided.  MDM  No diagnosis found. Patient presents one day after discharge following neurosurgical procedure, now with acute urinary retention.  On exam he is awake and alert, afebrile, hemodynamically stable.  Patient's substantial relief with catheter placement.  Absent urinalysis evidence of  infection, or fever, and wife are not indicated, but urine culture was sent. Patient will follow up with his primary care physician, urologist, and his neurosurgeon as discussed    Gerhard Munch, MD 12/06/12 1733

## 2012-12-06 NOTE — ED Notes (Signed)
Pt in stating he had back surgery recently, was discharged on 10/20, pt states he has not urinated well since yesterday morning, pt states he has not urinated at all today, states he feels full, also c/o lower back pain

## 2012-12-22 ENCOUNTER — Emergency Department (HOSPITAL_BASED_OUTPATIENT_CLINIC_OR_DEPARTMENT_OTHER): Payer: Medicare Other

## 2012-12-22 ENCOUNTER — Encounter (HOSPITAL_BASED_OUTPATIENT_CLINIC_OR_DEPARTMENT_OTHER): Payer: Self-pay | Admitting: Emergency Medicine

## 2012-12-22 ENCOUNTER — Emergency Department (HOSPITAL_BASED_OUTPATIENT_CLINIC_OR_DEPARTMENT_OTHER)
Admission: EM | Admit: 2012-12-22 | Discharge: 2012-12-22 | Disposition: A | Discharge status: Home / Self Care | Payer: Medicare Other | Attending: Emergency Medicine | Admitting: Emergency Medicine

## 2012-12-22 ENCOUNTER — Other Ambulatory Visit (HOSPITAL_BASED_OUTPATIENT_CLINIC_OR_DEPARTMENT_OTHER): Payer: Medicare Other

## 2012-12-22 DIAGNOSIS — M545 Low back pain, unspecified: Secondary | ICD-10-CM

## 2012-12-22 DIAGNOSIS — K5900 Constipation, unspecified: Secondary | ICD-10-CM

## 2012-12-22 DIAGNOSIS — Z87891 Personal history of nicotine dependence: Secondary | ICD-10-CM | POA: Insufficient documentation

## 2012-12-22 DIAGNOSIS — R109 Unspecified abdominal pain: Secondary | ICD-10-CM

## 2012-12-22 DIAGNOSIS — Z7982 Long term (current) use of aspirin: Secondary | ICD-10-CM | POA: Insufficient documentation

## 2012-12-22 DIAGNOSIS — Z79899 Other long term (current) drug therapy: Secondary | ICD-10-CM | POA: Insufficient documentation

## 2012-12-22 DIAGNOSIS — M1990 Unspecified osteoarthritis, unspecified site: Secondary | ICD-10-CM | POA: Insufficient documentation

## 2012-12-22 DIAGNOSIS — Z9889 Other specified postprocedural states: Secondary | ICD-10-CM | POA: Insufficient documentation

## 2012-12-22 DIAGNOSIS — K219 Gastro-esophageal reflux disease without esophagitis: Secondary | ICD-10-CM | POA: Insufficient documentation

## 2012-12-22 DIAGNOSIS — Z880 Allergy status to penicillin: Secondary | ICD-10-CM | POA: Insufficient documentation

## 2012-12-22 LAB — URINALYSIS, ROUTINE W REFLEX MICROSCOPIC
Bilirubin Urine: NEGATIVE
Glucose, UA: NEGATIVE mg/dL
Hgb urine dipstick: NEGATIVE
Ketones, ur: NEGATIVE mg/dL
Leukocytes, UA: NEGATIVE
Nitrite: NEGATIVE
Protein, ur: NEGATIVE mg/dL
Specific Gravity, Urine: 1.013 (ref 1.005–1.030)
Urobilinogen, UA: 0.2 mg/dL (ref 0.0–1.0)
pH: 5.5 (ref 5.0–8.0)

## 2012-12-22 NOTE — ED Notes (Signed)
Back pain

## 2012-12-22 NOTE — ED Provider Notes (Signed)
CSN: [id number]     Arrival date & time 12/22/12  1502 History   First MD Initiated Contact with Patient 12/22/12 1521     Chief Complaint  Patient presents with  . Back Pain    s/p 3 week from back surgery   (Consider location/radiation/quality/duration/timing/severity/associated sxs/prior Treatment) HPI Pt is a 79yo male with hx of degenerative arthritis, recent lumbar laminectomy/decompression microdiscectomy, 12/04/12, presenting today c/o left sided lower back pain that has gradually worsened over the last 2 days.  Awoke patient around 2am this morning pain was so severe.  Pain is constant and aching.  Worse with certain movements and palpation.  Reports speaking with his surgeon, Dr. Wynetta Emery, neurosurgery who advised him to come to ED for x-rays.  Pt denies recent fall, heavy lifting or other trauma to his back.   Has been placed on flexeril, hydrocodone, and prednisone.Denies fever, n/v/d. Denies saddle paraesthesia.  Does report being constipated after surgery from pain medication but states he is now having regular BMs.       Past Medical History  Diagnosis Date  . GERD (gastroesophageal reflux disease)   . Degenerative arthritis   . Constipation   . Seasonal allergies    Past Surgical History  Procedure Laterality Date  . Hip surgery    . Back surgery    . Neck surgery    . Joint replacement      hip  . Eye surgery      right  . Lumbar laminectomy/decompression microdiscectomy Bilateral 12/04/2012    Procedure: BILATERAL LUMBAR LAMINECTOMY/DECOMPRESSION MICRODISCECTOMY LUMBAR FOUR-TO FIVE SACRALONE;  Surgeon: Mariam Dollar, MD;  Location: [facility];  Service: Neurosurgery;  Laterality: Bilateral;   No family history on file. History  Substance Use Topics  . Smoking status: Former Smoker    Quit date: 02/16/1960  . Smokeless tobacco: Not on file  . Alcohol Use: No    Review of Systems  Constitutional: Negative for fever and chills.  Genitourinary: Negative for  dysuria, decreased urine volume and difficulty urinating.  Musculoskeletal: Positive for back pain and myalgias. Negative for neck pain and neck stiffness.  All other systems reviewed and are negative.    Allergies  Penicillins  Home Medications   Current Outpatient Rx  Name  Route  Sig  Dispense  Refill  . albuterol (PROVENTIL HFA;VENTOLIN HFA) 108 (90 BASE) MCG/ACT inhaler   Inhalation   Inhale 2 puffs into the lungs every 6 (six) hours as needed for wheezing.         Marland Kitchen aspirin EC 81 MG tablet   Oral   Take 81 mg by mouth daily.         . cyclobenzaprine (FLEXERIL) 10 MG tablet   Oral   Take 1 tablet (10 mg total) by mouth 3 (three) times daily as needed for muscle spasms.   80 tablet   1   . fexofenadine (ALLEGRA) 180 MG tablet   Oral   Take 180 mg by mouth every other day.          Marland Kitchen guaiFENesin (MUCINEX) 600 MG 12 hr tablet   Oral   Take 1,200 mg by mouth 2 (two) times daily.         Marland Kitchen HYDROcodone-acetaminophen (NORCO/VICODIN) 5-325 MG per tablet   Oral   Take 1 tablet by mouth every 4 (four) hours as needed. For pain         . mometasone (NASONEX) 50 MCG/ACT nasal spray   Nasal  Place 2 sprays into the nose daily.         Marland Kitchen omeprazole (PRILOSEC) 40 MG capsule   Oral   Take 40 mg by mouth every evening.         . venlafaxine (EFFEXOR) 37.5 MG tablet   Oral   Take 37.5 mg by mouth daily.          BP 158/89  Pulse 59  Temp(Src) 98.2 F (36.8 C)  Resp 20  Wt 230 lb (104.327 kg)  SpO2 98% Physical Exam  Nursing note and vitals reviewed. Constitutional: He appears well-developed and well-nourished.  Pt lying comfortably in exam bed, NAD.   HENT:  Head: Normocephalic and atraumatic.  Eyes: Conjunctivae are normal. No scleral icterus.  Neck: Normal range of motion.  Cardiovascular: Normal rate, regular rhythm and normal heart sounds.   Pulmonary/Chest: Effort normal and breath sounds normal. No respiratory distress. He has no wheezes.  He has no rales. He exhibits no tenderness.  Abdominal: Soft. Bowel sounds are normal. He exhibits no distension and no mass. There is tenderness ( left abdomen). There is no rebound and no guarding.  Musculoskeletal: Normal range of motion. He exhibits tenderness. He exhibits no edema.       Lumbar back: He exhibits tenderness. He exhibits normal range of motion.       Back:  Newly healed surgical scar over sacrum.  Tenderness over left lumbar musculature. Mild tenderness just left of surgical scar over L5-S1.  No step offs or crepitus.  No rashes or lesions.   Neurological: He is alert.  Antalgic gait. No ataxia.  Skin: Skin is warm and dry. No rash noted. No erythema.    ED Course  Procedures (including critical care time) Labs Review Labs Reviewed  URINALYSIS, ROUTINE W REFLEX MICROSCOPIC   Imaging Review Ct Abdomen Pelvis Wo Contrast  12/22/2012   CLINICAL DATA:  Back pain for several days with worsening last 2 days  EXAM: CT ABDOMEN AND PELVIS WITHOUT CONTRAST  TECHNIQUE: Multidetector CT imaging of the abdomen and pelvis was performed following the standard protocol without intravenous contrast.  COMPARISON:  Report 09/07/2006 no images available.  FINDINGS: Mitral valve calcifications are noted. Lung bases are unremarkable. Old left lower rib fractures are noted.  The study is limited without IV and oral contrast. Unenhanced liver shows no biliary ductal dilatation. The patient is status postcholecystectomy. Atherosclerotic calcifications of abdominal aorta, bilateral renal artery origin, SMA and iliac arteries are noted. Sagittal images of the spine shows degenerative changes thoracolumbar spine. There is moderate compression deformity superior endplate of L2 vertebral body probable chronic in nature. Multilevel disc space flattening is noted.  Unenhanced pancreas, spleen and adrenals are unremarkable. There is moderate right renal atrophy. Vascular calcifications are noted in left renal  hilum. No hydronephrosis or hydroureter. Bilateral no calcified ureteral calculi are identified.  Abundant stool noted in right colon. No pericecal inflammation. Normal appendix clearly visualize in axial image 43. There is moderate distension of transverse colon with gas and stool. Left colon is empty partially collapsed. Sigmoid colon diverticula are noted without evidence of acute diverticulitis. Evaluation of pelvis is limited by metallic artifacts from bilateral hip prosthesis. There is left inguinal scrotal canal hernia containing fat without evidence of acute complication. Partially visualized urinary bladder is unremarkable. Degenerative changes are noted bilateral SI joints. There is probable prior laminectomy at L5 level.  IMPRESSION: 1. Suboptimal study without IV and oral contrast. No nephrolithiasis. No hydronephrosis or hydroureter. 2.  Degenerative changes lumbar spine. Probable chronic moderate compression fracture superior endplate of L2 vertebral body. 3. Abundant stool noted in right colon. Normal appendix. No pericecal inflammation. 4. No calcified ureteral calculi are noted. 5. Limited evaluation of the pelvis due to metallic artifacts from bilateral hip prosthesis.   Electronically Signed   By: Natasha Mead M.D.   On: 12/22/2012 17:37   Dg Lumbar Spine Complete  12/22/2012   CLINICAL DATA:  Lumbar spine surgery October 2014 with tingling pain along middle of back for 1 day  EXAM: LUMBAR SPINE - COMPLETE 4+ VIEW  COMPARISON:  10/26/2012  FINDINGS: Normal anterior-posterior alignment. Moderate L2 compression deformity. Degenerative disc disease throughout the lumbar spine. Mild sigmoid scoliotic curvature of the lumbar spine is noted. Significant fecal retention incidentally detected. Calcification of the aorta.  IMPRESSION: No acute findings. Stable degenerative change and L2 compression deformity   Electronically Signed   By: Esperanza Heir M.D.   On: 12/22/2012 16:10   Dg  Sacrum/coccyx  12/22/2012   CLINICAL DATA:  Tingling pain middle of back for 1 day with history of lumbar spine surgery approximately 1 month ago  EXAM: SACRUM AND COCCYX - 2+ VIEW  COMPARISON:  None.  FINDINGS: There is no evidence of fracture or other focal bone lesions  IMPRESSION: Negative.   Electronically Signed   By: Esperanza Heir M.D.   On: 12/22/2012 16:11   Dg Abd 2 Views  12/22/2012   CLINICAL DATA:  Left lower quadrant pain.  EXAM: ABDOMEN - 2 VIEW  COMPARISON:  12/19/2012  FINDINGS: Large amount of stool in the right colon. Right and transverse colon are distended suggesting ileus. Left colon is decompressed. Negative for obstruction or free air.  Bilateral hip replacement in satisfactory position.  IMPRESSION: Constipation and ileus of the colon.   Electronically Signed   By: Marlan Palau M.D.   On: 12/22/2012 16:57    EKG Interpretation     Ventricular Rate:  110 PR Interval:  232 QRS Duration: 98 QT Interval:  324 QTC Calculation: 438 R Axis:   -35 Text Interpretation:  Sinus tachycardia with 1st degree A-V block with frequent and consecutive Premature ventricular complexes Possible Left atrial enlargement Left axis deviation Left ventricular hypertrophy Abnormal ECG since last tracing no significant change            MDM   1. Left low back pain   2. Left sided abdominal pain   3. Constipation    Pt with recent lumbosacral surgery c/o left sided lower back pain. Denies falls, heavy lifting, or any other trauma since surgery.  Was advised by neurosurgeon, Dr. Wynetta Emery, to come to ED for x-rays of back.  Pt is tender in left lumbar musculature and just left of surgical incision but no crepitus or step offs. No evidence of cellulitis. No ecchymosis or lesions. Pt able to ambulate without assistance, antalgic gait.  Declines pain medication in ED.  Will get plain films.   Plain films unremarkable.  Discussed pt with Dr. Fredderick Phenix, small concern for aortic aneursym as pt  reports pain radiating from back into his abdomen.  Possibility of renal stone or infection.  Will get CT abdomen as U/S aorta unavailable at this facility. UA: unremarkable. No hematuria or bacteria.  CT abdomen: moderate stool burden in right and transverse colon. Otherwise, no acute findings.  All labs/imaging/findings discussed with patient. All questions answered and concerns addressed. Will discharge pt home and have pt f/u with his PCP  and Dr. Wynetta Emery as needed for continued pain.  Instructed to continue taking stool softener and all medication prescribed by Dr. Wynetta Emery. Return precautions given. Pt verbalized understanding and agreement with tx plan. Vitals: unremarkable. Discharged in stable condition.    Discussed pt with attending during ED encounter and agrees with plan.   Junius Finner, PA-C 12/22/12 1800

## 2012-12-22 NOTE — ED Provider Notes (Signed)
Medical screening examination/treatment/procedure(s) were conducted as a shared visit with non-physician practitioner(s) and myself.  I personally evaluated the patient during the encounter.  EKG Interpretation     Ventricular Rate:  110 PR Interval:  232 QRS Duration: 98 QT Interval:  324 QTC Calculation: 438 R Axis:   -35 Text Interpretation:  Sinus tachycardia with 1st degree A-V block with frequent and consecutive Premature ventricular complexes Possible Left atrial enlargement Left axis deviation Left ventricular hypertrophy Abnormal ECG since last tracing no significant change            Pt s/p lumbar spine surgery 3 weeks ago, with pain to left lateral lumbar area, radiates to left flank.  No neuro deficitis.  No signs of cauda equina.  Pain is likely musculoskeletal, but given radiation to flank, will check urine, AAS, ultrasound of aorta  Rolan Bucco, MD 12/22/12 [facility] [place]

## 2012-12-22 NOTE — ED Notes (Signed)
Pt. Ambulates in hall with no difficulty

## 2013-09-10 DIAGNOSIS — M51379 Other intervertebral disc degeneration, lumbosacral region without mention of lumbar back pain or lower extremity pain: Secondary | ICD-10-CM | POA: Insufficient documentation

## 2013-09-10 DIAGNOSIS — M4850XA Collapsed vertebra, not elsewhere classified, site unspecified, initial encounter for fracture: Secondary | ICD-10-CM | POA: Insufficient documentation

## 2013-09-10 DIAGNOSIS — H4910 Fourth [trochlear] nerve palsy, unspecified eye: Secondary | ICD-10-CM | POA: Insufficient documentation

## 2013-09-10 DIAGNOSIS — J309 Allergic rhinitis, unspecified: Secondary | ICD-10-CM | POA: Insufficient documentation

## 2013-09-10 DIAGNOSIS — M48061 Spinal stenosis, lumbar region without neurogenic claudication: Secondary | ICD-10-CM | POA: Insufficient documentation

## 2013-09-10 DIAGNOSIS — M1990 Unspecified osteoarthritis, unspecified site: Secondary | ICD-10-CM | POA: Insufficient documentation

## 2013-09-10 DIAGNOSIS — F339 Major depressive disorder, recurrent, unspecified: Secondary | ICD-10-CM | POA: Insufficient documentation

## 2013-09-10 DIAGNOSIS — M545 Low back pain, unspecified: Secondary | ICD-10-CM | POA: Insufficient documentation

## 2013-09-10 DIAGNOSIS — K219 Gastro-esophageal reflux disease without esophagitis: Secondary | ICD-10-CM | POA: Insufficient documentation

## 2013-09-10 DIAGNOSIS — M5137 Other intervertebral disc degeneration, lumbosacral region: Secondary | ICD-10-CM | POA: Insufficient documentation

## 2013-09-10 DIAGNOSIS — N261 Atrophy of kidney (terminal): Secondary | ICD-10-CM | POA: Insufficient documentation

## 2013-09-10 DIAGNOSIS — Z86010 Personal history of colonic polyps: Secondary | ICD-10-CM | POA: Insufficient documentation

## 2013-09-13 DIAGNOSIS — I493 Ventricular premature depolarization: Secondary | ICD-10-CM | POA: Insufficient documentation

## 2013-09-26 DIAGNOSIS — M542 Cervicalgia: Secondary | ICD-10-CM | POA: Insufficient documentation

## 2013-10-31 DIAGNOSIS — G4733 Obstructive sleep apnea (adult) (pediatric): Secondary | ICD-10-CM | POA: Insufficient documentation

## 2013-10-31 DIAGNOSIS — Z13220 Encounter for screening for lipoid disorders: Secondary | ICD-10-CM | POA: Insufficient documentation

## 2013-10-31 DIAGNOSIS — R5383 Other fatigue: Secondary | ICD-10-CM | POA: Insufficient documentation

## 2013-12-10 ENCOUNTER — Observation Stay (HOSPITAL_BASED_OUTPATIENT_CLINIC_OR_DEPARTMENT_OTHER)
Admission: EM | Admit: 2013-12-10 | Discharge: 2013-12-11 | Disposition: A | Discharge status: Home / Self Care | Payer: Medicare Other | Attending: Internal Medicine | Admitting: Internal Medicine

## 2013-12-10 ENCOUNTER — Encounter (HOSPITAL_BASED_OUTPATIENT_CLINIC_OR_DEPARTMENT_OTHER): Payer: Self-pay | Admitting: Emergency Medicine

## 2013-12-10 ENCOUNTER — Emergency Department (HOSPITAL_BASED_OUTPATIENT_CLINIC_OR_DEPARTMENT_OTHER): Payer: Medicare Other

## 2013-12-10 ENCOUNTER — Encounter (HOSPITAL_COMMUNITY)
Admission: EM | Disposition: A | Discharge status: Home / Self Care | Payer: Self-pay | Source: Home / Self Care | Attending: Internal Medicine

## 2013-12-10 DIAGNOSIS — E669 Obesity, unspecified: Secondary | ICD-10-CM | POA: Diagnosis not present

## 2013-12-10 DIAGNOSIS — R008 Other abnormalities of heart beat: Secondary | ICD-10-CM | POA: Diagnosis not present

## 2013-12-10 DIAGNOSIS — Z6837 Body mass index (BMI) 37.0-37.9, adult: Secondary | ICD-10-CM | POA: Insufficient documentation

## 2013-12-10 DIAGNOSIS — Z87891 Personal history of nicotine dependence: Secondary | ICD-10-CM | POA: Diagnosis not present

## 2013-12-10 DIAGNOSIS — I200 Unstable angina: Secondary | ICD-10-CM | POA: Diagnosis present

## 2013-12-10 DIAGNOSIS — R079 Chest pain, unspecified: Secondary | ICD-10-CM | POA: Diagnosis present

## 2013-12-10 DIAGNOSIS — I2584 Coronary atherosclerosis due to calcified coronary lesion: Secondary | ICD-10-CM | POA: Diagnosis not present

## 2013-12-10 DIAGNOSIS — Z7982 Long term (current) use of aspirin: Secondary | ICD-10-CM | POA: Insufficient documentation

## 2013-12-10 DIAGNOSIS — Z79899 Other long term (current) drug therapy: Secondary | ICD-10-CM | POA: Insufficient documentation

## 2013-12-10 DIAGNOSIS — I10 Essential (primary) hypertension: Secondary | ICD-10-CM | POA: Diagnosis not present

## 2013-12-10 DIAGNOSIS — G4730 Sleep apnea, unspecified: Secondary | ICD-10-CM | POA: Diagnosis not present

## 2013-12-10 DIAGNOSIS — I25110 Atherosclerotic heart disease of native coronary artery with unstable angina pectoris: Secondary | ICD-10-CM | POA: Diagnosis not present

## 2013-12-10 DIAGNOSIS — E785 Hyperlipidemia, unspecified: Secondary | ICD-10-CM | POA: Diagnosis present

## 2013-12-10 DIAGNOSIS — K219 Gastro-esophageal reflux disease without esophagitis: Secondary | ICD-10-CM | POA: Diagnosis not present

## 2013-12-10 HISTORY — PX: LEFT HEART CATHETERIZATION WITH CORONARY ANGIOGRAM: SHX5451

## 2013-12-10 LAB — PROTIME-INR
INR: 1.12 (ref 0.00–1.49)
Prothrombin Time: 14.5 seconds (ref 11.6–15.2)

## 2013-12-10 LAB — TROPONIN I
Troponin I: <0.3 ng/mL (ref <0.30)
Troponin I: <0.3 ng/mL (ref <0.30)
Troponin I: <0.3 ng/mL (ref <0.30)
Troponin I: <0.3 ng/mL (ref <0.30)

## 2013-12-10 LAB — POCT I-STAT 3, ART BLOOD GAS (G3+)
Acid-Base Excess: 1 mmol/L (ref 0.0–2.0)
Bicarbonate: 23.7 mEq/L (ref 20.0–24.0)
O2 Saturation: 92 %
TCO2: 25 mmol/L (ref 0–100)
pCO2 arterial: 33.1 mmHg — ABNORMAL LOW (ref 35.0–45.0)
pH, Arterial: 7.463 — ABNORMAL HIGH (ref 7.350–7.450)
pO2, Arterial: 59 mmHg — ABNORMAL LOW (ref 80.0–100.0)

## 2013-12-10 LAB — POCT I-STAT 3, VENOUS BLOOD GAS (G3P V)
Acid-Base Excess: 2 mmol/L (ref 0.0–2.0)
Bicarbonate: 26.5 mEq/L — ABNORMAL HIGH (ref 20.0–24.0)
O2 Saturation: 63 %
TCO2: 28 mmol/L (ref 0–100)
pCO2, Ven: 41.9 mmHg — ABNORMAL LOW (ref 45.0–50.0)
pH, Ven: 7.409 — ABNORMAL HIGH (ref 7.250–7.300)
pO2, Ven: 32 mmHg (ref 30.0–45.0)

## 2013-12-10 LAB — CREATININE, SERUM
Creatinine, Ser: 0.81 mg/dL (ref 0.50–1.35)
GFR calc Af Amer: >90 mL/min (ref >90)
GFR calc non Af Amer: 83 mL/min — ABNORMAL LOW (ref >90)

## 2013-12-10 LAB — CBC
HCT: 42.7 % (ref 39.0–52.0)
HCT: 41.9 % (ref 39.0–52.0)
HCT: 43.3 % (ref 39.0–52.0)
Hemoglobin: 14.4 g/dL (ref 13.0–17.0)
Hemoglobin: 14.1 g/dL (ref 13.0–17.0)
Hemoglobin: 14.8 g/dL (ref 13.0–17.0)
MCH: 32.4 pg (ref 26.0–34.0)
MCH: 31.8 pg (ref 26.0–34.0)
MCH: 32.2 pg (ref 26.0–34.0)
MCHC: 33.7 g/dL (ref 30.0–36.0)
MCHC: 33.7 g/dL (ref 30.0–36.0)
MCHC: 34.2 g/dL (ref 30.0–36.0)
MCV: 96.2 fL (ref 78.0–100.0)
MCV: 94.6 fL (ref 78.0–100.0)
MCV: 94.3 fL (ref 78.0–100.0)
Platelets: 208 10*3/uL (ref 150–400)
Platelets: 220 10*3/uL (ref 150–400)
Platelets: 217 10*3/uL (ref 150–400)
RBC: 4.44 MIL/uL (ref 4.22–5.81)
RBC: 4.43 MIL/uL (ref 4.22–5.81)
RBC: 4.59 MIL/uL (ref 4.22–5.81)
RDW: 14.7 % (ref 11.5–15.5)
RDW: 14.6 % (ref 11.5–15.5)
RDW: 14.5 % (ref 11.5–15.5)
WBC: 8.5 10*3/uL (ref 4.0–10.5)
WBC: 7.9 10*3/uL (ref 4.0–10.5)
WBC: 7.9 10*3/uL (ref 4.0–10.5)

## 2013-12-10 LAB — BASIC METABOLIC PANEL
Anion gap: 11 (ref 5–15)
BUN: 29 mg/dL — ABNORMAL HIGH (ref 6–23)
CO2: 25 mEq/L (ref 19–32)
Calcium: 10 mg/dL (ref 8.4–10.5)
Chloride: 101 mEq/L (ref 96–112)
Creatinine, Ser: 0.9 mg/dL (ref 0.50–1.35)
GFR calc Af Amer: >90 mL/min (ref >90)
GFR calc non Af Amer: 79 mL/min — ABNORMAL LOW (ref >90)
Glucose, Bld: 106 mg/dL — ABNORMAL HIGH (ref 70–99)
Potassium: 4.5 mEq/L (ref 3.7–5.3)
Sodium: 137 mEq/L (ref 137–147)

## 2013-12-10 LAB — LIPID PANEL
Cholesterol: 168 mg/dL (ref 0–200)
HDL: 56 mg/dL (ref >39)
LDL Cholesterol: 94 mg/dL (ref 0–99)
Total CHOL/HDL Ratio: 3 RATIO
Triglycerides: 88 mg/dL (ref <150)
VLDL: 18 mg/dL (ref 0–40)

## 2013-12-10 LAB — POCT ACTIVATED CLOTTING TIME: Activated Clotting Time: 191 seconds

## 2013-12-10 LAB — PRO B NATRIURETIC PEPTIDE: Pro B Natriuretic peptide (BNP): 323.3 pg/mL (ref 0–450)

## 2013-12-10 SURGERY — LEFT HEART CATHETERIZATION WITH CORONARY ANGIOGRAM
Anesthesia: LOCAL

## 2013-12-10 MED ORDER — ASPIRIN EC 81 MG PO TBEC: 81.0000 mg | DELAYED_RELEASE_TABLET | Freq: Every day | ORAL | Status: DC

## 2013-12-10 MED ORDER — LIDOCAINE HCL (PF) 1 % IJ SOLN
INTRAMUSCULAR | Status: AC
  Filled 2013-12-10: qty 30

## 2013-12-10 MED ORDER — SODIUM CHLORIDE 0.9 % IV SOLN
1.0000 mL/kg/h | INTRAVENOUS | Status: AC
  Administered 2013-12-10: 1 mL/kg/h via INTRAVENOUS

## 2013-12-10 MED ORDER — VERAPAMIL HCL 2.5 MG/ML IV SOLN
INTRAVENOUS | Status: AC
  Filled 2013-12-10: qty 2

## 2013-12-10 MED ORDER — ASPIRIN 81 MG PO CHEW: 81.0000 mg | CHEWABLE_TABLET | ORAL | Status: DC

## 2013-12-10 MED ORDER — FENTANYL CITRATE 0.05 MG/ML IJ SOLN
INTRAMUSCULAR | Status: AC
  Filled 2013-12-10: qty 2

## 2013-12-10 MED ORDER — SODIUM CHLORIDE 0.9 % IV SOLN: 250.0000 mL | INTRAVENOUS | Status: DC | PRN

## 2013-12-10 MED ORDER — ALBUTEROL SULFATE HFA 108 (90 BASE) MCG/ACT IN AERS: 2.0000 | INHALATION_SPRAY | Freq: Four times a day (QID) | RESPIRATORY_TRACT | Status: DC | PRN

## 2013-12-10 MED ORDER — ACETAMINOPHEN 325 MG PO TABS: 650.0000 mg | ORAL_TABLET | ORAL | Status: DC | PRN

## 2013-12-10 MED ORDER — ATORVASTATIN CALCIUM 80 MG PO TABS
80.0000 mg | ORAL_TABLET | Freq: Every day | ORAL | Status: DC
  Administered 2013-12-10: 80 mg via ORAL
  Filled 2013-12-10: qty 1

## 2013-12-10 MED ORDER — ALBUTEROL SULFATE (2.5 MG/3ML) 0.083% IN NEBU
2.5000 mg | INHALATION_SOLUTION | Freq: Four times a day (QID) | RESPIRATORY_TRACT | Status: DC | PRN
Start: 2013-12-10 — End: 2013-12-11

## 2013-12-10 MED ORDER — HEPARIN (PORCINE) IN NACL 100-0.45 UNIT/ML-% IJ SOLN
1200.0000 [IU]/h | INTRAMUSCULAR | Status: DC
  Administered 2013-12-10: 1200 [IU]/h via INTRAVENOUS
  Filled 2013-12-10: qty 250

## 2013-12-10 MED ORDER — ASPIRIN EC 81 MG PO TBEC
81.0000 mg | DELAYED_RELEASE_TABLET | Freq: Every day | ORAL | Status: DC
Start: 2013-12-11 — End: 2013-12-11
  Administered 2013-12-11: 81 mg via ORAL
  Filled 2013-12-10: qty 1

## 2013-12-10 MED ORDER — HEPARIN SODIUM (PORCINE) 1000 UNIT/ML IJ SOLN
INTRAMUSCULAR | Status: AC
  Filled 2013-12-10: qty 1

## 2013-12-10 MED ORDER — NITROGLYCERIN 1 MG/10 ML FOR IR/CATH LAB
INTRA_ARTERIAL | Status: AC
  Filled 2013-12-10: qty 10

## 2013-12-10 MED ORDER — NITROGLYCERIN 0.4 MG SL SUBL: 0.4000 mg | SUBLINGUAL_TABLET | SUBLINGUAL | Status: DC | PRN

## 2013-12-10 MED ORDER — IBUPROFEN 200 MG PO TABS: 400.0000 mg | ORAL_TABLET | Freq: Three times a day (TID) | ORAL | Status: DC | PRN

## 2013-12-10 MED ORDER — ASPIRIN 81 MG PO CHEW
324.0000 mg | CHEWABLE_TABLET | ORAL | Status: DC
Start: 2013-12-10 — End: 2013-12-11
  Filled 2013-12-10: qty 4

## 2013-12-10 MED ORDER — HEPARIN SODIUM (PORCINE) 5000 UNIT/ML IJ SOLN
5000.0000 [IU] | Freq: Three times a day (TID) | INTRAMUSCULAR | Status: DC
Start: 2013-12-10 — End: 2013-12-11
  Administered 2013-12-10 – 2013-12-11 (×2): 5000 [IU] via SUBCUTANEOUS
  Filled 2013-12-10 (×2): qty 1

## 2013-12-10 MED ORDER — VITAMIN B-12 100 MCG PO TABS
100.0000 ug | ORAL_TABLET | Freq: Every day | ORAL | Status: DC
  Administered 2013-12-11: 100 ug via ORAL
  Filled 2013-12-10: qty 1

## 2013-12-10 MED ORDER — SODIUM CHLORIDE 0.9 % IJ SOLN: 3.0000 mL | INTRAMUSCULAR | Status: DC | PRN

## 2013-12-10 MED ORDER — MIDAZOLAM HCL 2 MG/2ML IJ SOLN
INTRAMUSCULAR | Status: AC
  Filled 2013-12-10: qty 2

## 2013-12-10 MED ORDER — ASPIRIN 300 MG RE SUPP: 300.0000 mg | RECTAL | Status: DC

## 2013-12-10 MED ORDER — NITROGLYCERIN 0.4 MG SL SUBL
0.4000 mg | SUBLINGUAL_TABLET | SUBLINGUAL | Status: DC | PRN
Start: 2013-12-10 — End: 2013-12-11
  Administered 2013-12-10: 0.4 mg via SUBLINGUAL
  Filled 2013-12-10: qty 1

## 2013-12-10 MED ORDER — SODIUM CHLORIDE 0.9 % IJ SOLN: 3.0000 mL | Freq: Two times a day (BID) | INTRAMUSCULAR | Status: DC

## 2013-12-10 MED ORDER — GUAIFENESIN ER 600 MG PO TB12
1200.0000 mg | ORAL_TABLET | Freq: Two times a day (BID) | ORAL | Status: DC
  Administered 2013-12-10 – 2013-12-11 (×2): 1200 mg via ORAL
  Filled 2013-12-10 (×2): qty 2

## 2013-12-10 MED ORDER — SODIUM CHLORIDE 0.9 % IV SOLN: INTRAVENOUS | Status: DC

## 2013-12-10 MED ORDER — SODIUM CHLORIDE 0.9 % IJ SOLN
3.0000 mL | Freq: Two times a day (BID) | INTRAMUSCULAR | Status: DC
  Administered 2013-12-10 – 2013-12-11 (×2): 3 mL via INTRAVENOUS

## 2013-12-10 MED ORDER — LORATADINE 10 MG PO TABS
10.0000 mg | ORAL_TABLET | Freq: Every day | ORAL | Status: DC
  Administered 2013-12-11: 10 mg via ORAL
  Filled 2013-12-10: qty 1

## 2013-12-10 MED ORDER — ONDANSETRON HCL 4 MG/2ML IJ SOLN
4.0000 mg | Freq: Four times a day (QID) | INTRAMUSCULAR | Status: DC | PRN
  Administered 2013-12-11: 4 mg via INTRAVENOUS
  Filled 2013-12-10: qty 2

## 2013-12-10 MED ORDER — SODIUM CHLORIDE 0.9 % IJ SOLN
3.0000 mL | INTRAMUSCULAR | Status: DC | PRN
Start: 2013-12-10 — End: 2013-12-11

## 2013-12-10 MED ORDER — PANTOPRAZOLE SODIUM 40 MG PO TBEC
40.0000 mg | DELAYED_RELEASE_TABLET | Freq: Every day | ORAL | Status: DC
  Administered 2013-12-11: 40 mg via ORAL
  Filled 2013-12-10: qty 1

## 2013-12-10 MED ORDER — ASPIRIN 81 MG PO CHEW
324.0000 mg | CHEWABLE_TABLET | Freq: Once | ORAL | Status: AC
  Administered 2013-12-10: 324 mg via ORAL
  Filled 2013-12-10: qty 4

## 2013-12-10 MED ORDER — VENLAFAXINE HCL 37.5 MG PO TABS
37.5000 mg | ORAL_TABLET | Freq: Every day | ORAL | Status: DC
  Administered 2013-12-11: 37.5 mg via ORAL
  Filled 2013-12-10: qty 1

## 2013-12-10 MED ORDER — HEPARIN (PORCINE) IN NACL 2-0.9 UNIT/ML-% IJ SOLN
INTRAMUSCULAR | Status: AC
  Filled 2013-12-10: qty 1000

## 2013-12-10 NOTE — ED Notes (Signed)
Pt reports chest pain that started yesterday. Pt reports that he noticed shortness of breath when trying to lay down.  Pt has hx of sleep apnea.  Reports pain as a dull ache that radiates to left arm, elbow and wrist. Pt reports pain is now radiating into right chest.

## 2013-12-10 NOTE — ED Notes (Signed)
[facility] 

## 2013-12-10 NOTE — Progress Notes (Signed)
ANTICOAGULATION CONSULT NOTE - Initial Consult  Pharmacy Consult for Heparin Indication: chest pain/ACS  Allergies  Allergen Reactions  . Penicillins Swelling    Patient Measurements: Height: 5\' 5"  (165.1 cm) Weight: 235 lb 6.4 oz (106.777 kg) IBW/kg (Calculated) : 61.5 Heparin Dosing Weight: 85 kg  Vital Signs: Temp: 97.3 F (36.3 C) (10/26 1014) Temp Source: Oral (10/26 1014) BP: 156/62 mmHg (10/26 1014) Pulse Rate: 92 (10/26 1014)  Labs:  Recent Labs  12/10/13 0655  HGB 14.4  HCT 42.7  PLT 208  CREATININE 0.90  TROPONINI <0.30    Estimated Creatinine Clearance: 76.2 ml/min (by C-G formula based on Cr of 0.9).   Medical History: Past Medical History  Diagnosis Date  . GERD (gastroesophageal reflux disease)   . Degenerative arthritis   . Constipation   . Seasonal allergies     Medications:  Prescriptions prior to admission  Medication Sig Dispense Refill  . albuterol (PROVENTIL HFA;VENTOLIN HFA) 108 (90 BASE) MCG/ACT inhaler Inhale 2 puffs into the lungs every 6 (six) hours as needed for wheezing.      Marland Kitchen aspirin EC 81 MG tablet Take 81 mg by mouth daily.      . cyclobenzaprine (FLEXERIL) 10 MG tablet Take 1 tablet (10 mg total) by mouth 3 (three) times daily as needed for muscle spasms.  80 tablet  1  . fexofenadine (ALLEGRA) 180 MG tablet Take 180 mg by mouth every other day.       Marland Kitchen guaiFENesin (MUCINEX) 600 MG 12 hr tablet Take 1,200 mg by mouth 2 (two) times daily.      Marland Kitchen HYDROcodone-acetaminophen (NORCO/VICODIN) 5-325 MG per tablet Take 1 tablet by mouth every 4 (four) hours as needed. For pain      . mometasone (NASONEX) 50 MCG/ACT nasal spray Place 2 sprays into the nose daily.      Marland Kitchen omeprazole (PRILOSEC) 40 MG capsule Take 40 mg by mouth every evening.      . venlafaxine (EFFEXOR) 37.5 MG tablet Take 37.5 mg by mouth daily.        Assessment: 80 yo M with episodes of intermittent chest tightness for the last week.  Pharmacy to start heparin  pending plans for cardiac cath.  Goal of Therapy:  Heparin level 0.3-0.7 units/ml Monitor platelets by anticoagulation protocol: Yes   Plan:  Begin heparin infusion at 1200 units/hr (no bolus per MD order) Heparin level in 8 hours. Heparin level and CBC daily.  [employer], Pharm.D., BCPS Clinical Pharmacist Pager [phone] 12/10/2013 12:10 PM

## 2013-12-10 NOTE — ED Notes (Addendum)
informed pt will go to 3W35 instead of 19. [facility].

## 2013-12-10 NOTE — ED Notes (Signed)
IV flushed and intact.

## 2013-12-10 NOTE — ED Notes (Signed)
Pt sts pain returned to left upper chest "for a few seconds" and rates pain 3/10. MD notified,  repeating EKG

## 2013-12-10 NOTE — ED Notes (Signed)
Report called to Esra, RN on 3W.

## 2013-12-10 NOTE — ED Notes (Signed)
Patient is going to 3W19C at [facility] by [facility]

## 2013-12-10 NOTE — H&P (Signed)
Primary Physician: Primary Cardiologist:  New   HPI: Patient is a 80 yo with no reported CAD Had cardiac cath about 10 years ago, reported normal  He has a history of sleep apnea, waiting on CPAP Over the past week he has noticed intermittent episodes of chest tightness  Associated with activity  Relieved with rest.  Felt like couldn't get breath.  Felt more fatigued. Saturday did OK  Yesterday continued to have spells  One was at rest (while watching football)  Some would radiated to L arm/hand. Last night had trouble sleeping, couldn't catch breath.  Came to [place] [facility]  No recent trip  No f/c  No cough.      Past Medical History  Diagnosis Date  . GERD (gastroesophageal reflux disease)   . Degenerative arthritis   . Constipation   . Seasonal allergies      (Not in a hospital admission)      Infusions:    Allergies  Allergen Reactions  . Penicillins Swelling    History   Social History  . Marital Status: Married    Spouse Name: N/A    Number of Children: N/A  . Years of Education: N/A   Occupational History  . Not on file.   Social History Main Topics  . Smoking status: Former Smoker    Quit date: 02/16/1960  . Smokeless tobacco: Not on file  . Alcohol Use: No  . Drug Use: No  . Sexual Activity: Not on file   Other Topics Concern  . Not on file   Social History Narrative  . No narrative on file    No family history on file.  REVIEW OF SYSTEMS:  All systems reviewed  Negative to the above problem except as noted above.    PHYSICAL EXAM: Filed Vitals:   12/10/13 0749  BP: 152/95  Pulse: 79  Temp:   Resp: 20    No intake or output data in the 24 hours ending 12/10/13 0826  General:  Well appearing. No respiratory difficulty HEENT: normal Neck: supple. no JVD. Carotids 2+ bilat; no bruits. No lymphadenopathy or thryomegaly appreciated. Cor: PMI nondisplaced. Regular rate & rhythm. No rubs, gallops or murmurs. Lungs:  clear Abdomen: soft, nontender, nondistended. No hepatosplenomegaly. No bruits or masses. Good bowel sounds. Extremities: no cyanosis, clubbing, rash, edema Neuro: alert & oriented x 3, cranial nerves grossly intact. moves all 4 extremities w/o difficulty. Affect pleasant.  ECG:  SR 79  First degree AV block PR 258 msec.  Occasional PAC and PVC    Results for orders placed during the hospital encounter of 12/10/13 (from the past 24 hour(s))  TROPONIN I     Status: None   Collection Time    12/10/13  6:55 AM      Result Value Ref Range   Troponin I <0.30  <0.30 ng/mL  PRO B NATRIURETIC PEPTIDE     Status: None   Collection Time    12/10/13  6:55 AM      Result Value Ref Range   Pro B Natriuretic peptide (BNP) 323.3  0 - 450 pg/mL  CBC     Status: None   Collection Time    12/10/13  6:55 AM      Result Value Ref Range   WBC 8.5  4.0 - 10.5 K/uL   RBC 4.44  4.22 - 5.81 MIL/uL   Hemoglobin 14.4  13.0 - 17.0 g/dL   HCT 42.7  39.0 -  52.0 %   MCV 96.2  78.0 - 100.0 fL   MCH 32.4  26.0 - 34.0 pg   MCHC 33.7  30.0 - 36.0 g/dL   RDW 14.7  11.5 - 15.5 %   Platelets 208  150 - 400 K/uL  BASIC METABOLIC PANEL     Status: Abnormal   Collection Time    12/10/13  6:55 AM      Result Value Ref Range   Sodium 137  137 - 147 mEq/L   Potassium 4.5  3.7 - 5.3 mEq/L   Chloride 101  96 - 112 mEq/L   CO2 25  19 - 32 mEq/L   Glucose, Bld 106 (*) 70 - 99 mg/dL   BUN 29 (*) 6 - 23 mg/dL   Creatinine, Ser 0.90  0.50 - 1.35 mg/dL   Calcium 10.0  8.4 - 10.5 mg/dL   GFR calc non Af Amer 79 (*) >90 mL/min   GFR calc Af Amer >90  >90 mL/min   Anion gap 11  5 - 15   Dg Chest Port 1 View  12/10/2013   CLINICAL DATA:  One-day history of chest pain and shortness of breath.  EXAM: PORTABLE CHEST - 1 VIEW  COMPARISON:  12/06/2012  FINDINGS: The heart is enlarged but stable. There is mild tortuosity and ectasia of the thoracic aorta. There are chronic bronchitic type interstitial lung changes and a left  lower lobe process which is likely atelectasis. No large pleural effusion or pneumothorax. The bony structures are grossly intact.  IMPRESSION: Stable cardiac enlargement and tortuous thoracic aorta.  Chronic bronchitic lung changes and probable left lower lobe atelectasis.   Electronically Signed   By: Kalman Jewels M.D.   On: 12/10/2013 07:36     ASSESSMENT: Patient is a 80 yo with no known CAD  PResents with history concerning for [place]>  Discussed with patient and wife  I would recomm  Heart cath and poss R heart cath to define. Risks/ benefits explained.  Patient understands  Agrees to procced with procedure.  2.  HCM  Check lipids  3.  HTN  Follow  Patinet says it is usually  Low.

## 2013-12-10 NOTE — CV Procedure (Signed)
RIGHT & LEFT HEART CATHETERIZATION REPORT  NAME:  Larry Church   MRN: [id number] DOB:  01/13/1933   ADMIT DATE: 12/10/2013 Procedure Date: 12/10/2013  INTERVENTIONAL CARDIOLOGIST: Leonie Man, M.D., [place] PRIMARY CARE PROVIDER: Lilian Coma., MD PRIMARY CARDIOLOGIST: Dorris Carnes, MD   PATIENT:  Larry Church is a 80 y.o. mand with prior Cath ~10 yrs ago with No significant CAD.  He has OSA & obesity. Over the past week he has noticed intermittent episodes of chest tightness Associated with activity Relieved with rest. Felt like couldn't get breath. Felt more fatigued.  Saturday did OK Yesterday continued to have spells One was at rest (while watching football) Some would radiated to L arm/hand.  Last night had trouble sleeping, couldn't catch breath. Came to [place] ER He was seen & evaluated by Dr. Harrington Challenger, who was concerned for Unstable Angina with CHF & has requested evaluation with a Right & Left Heart Catheterization.  PRE-OPERATIVE DIAGNOSIS:    Unstable Angina  Resting dyspnea  PROCEDURES PERFORMED:    Right & Left Heart Catheterization with Native Coronary Angiography  via Right Radial Artery & Right Common Femoral Vein Access.  Left Ventriculography  PROCEDURE: The patient was brought to the [facility]nd [facility] Lab in the fasting state and prepped and draped in the usual sterile fashion for Right groin access. A modified Allen's test was performed on the right wrist that showed adequate collateral flow for radial access. Sterile technique was used including antiseptics, cap, gloves, gown, hand hygiene, mask and sheet. Skin prep: Chlorhexidine.   Consent: Risks of procedure as well as the alternatives and risks of each were explained to the (patient/caregiver). Consent for procedure obtained.   Time Out: Verified patient identification, verified procedure, site/side was marked, verified correct patient position, special equipment/implants  available, medications/allergies/relevent history reviewed, required imaging and test results available. Performed.  Access:  Right Radial Artery: 5 Fr Sheath -  Seldinger Technique using Micropuncture Angiocath Kit Radial Cocktail: 5 mg Verapamil, 400 mcg NTG, 2 ml 2% Lidocaine in 10 ml NS IV Heparin 5600 Units given Right Common Femoral Vein: 7 Fr Sheath - Seldinger Technique.  Right Heart Catheterization: 7Fr Gordy Councilman catheter advanced under fluoroscopy with balloon inflated to the RA, RV, then PCWP-PA for hemodynamic measurement.  Simultaneous FA & PA blood gases checked for SaO2% to calculate FICK CO/CI  Thermodilution Injections performed to calculate CO/CI  Simultaneous PCWP/LV & RV/LV pressures monitored with Angled Pigtail in LV.  Catheter removed completely out of the body with balloon deflated.  Left Heart Catheterization: 5 Fr Catheters advanced or exchanged over a Long Exchange Safety J-wire; angled pigtail catheter advanced first.  LV Hemodynamics (LV Gram): Angled pigtail Right Coronary Artery Cineangiography: TIG 4.0 Catheter  Left Coronary Artery Cineangiography: XB LAD 4.0 Catheter   The sheath was removed in the cardiac catheterization lab with placement of a VASC Band @ 1620 hrs, 16 mL air.  Nonocclusive hemostasis was obtained.  MEDICATIONS:   ANESTHESIA: Local Lidocaine 18 ml for right groin, 2 mL for right wrist  SEDATION: 1 mg IV Versed, 25 mcg IV fentanyl   Omnipaque contrast 120  mL  EBL: < 10 ml, not including ABG and VBG samples   FINDINGS:  Hemodynamics:  Findings:   SaO2%  Pressures mmHg  Mean P  mmHg  EDP  mmHg   Right Atrium    10/10    11   Right Ventricle    27/2  5   Pulmonary Artery   63   20/11   14    PCWP    18/17   17    Central Aortic   92   120/65   87    Left Ventricle    118/9    18          Cardiac Output:   Cardiac Index:    Fick   4.89    2.34    Thermodilution   3.88    1.86     Coronary Anatomy: Codominant  Left  Main: Very large caliber vessel that trifurcates into the LAD, Ramus Intermedius, and Circumflex LAD: Normal caliber vessel with diffuse proximal calcification and roughly 20-30% stenosis. There are several small diagonal branches. At the takeoff of a small D1 there is roughly 40% stenosis. Beyond that in the distal vessel there is a roughly 50% focal lesion. No lesions. Flow-limiting.  Left Circumflex: Very large caliber, ectatic vessel with a ostial may be 40% stenosis followed by large ectatic segment the goes into the AV groove. There is a AV groove circumflex that courses into the left posterior lateral system and a large lateral OM branch that bifurcates distally. In the proximal eccentric segment there is the appearance of turbulent swirling flow likely due to ectasia/near aneurysmal dilation.. There is no evidence of stenosis.  OM1: Large-caliber lateral branch with several small branches. Bifurcates distally into 2 moderate caliber branches. The proximal portion is probably still ectatic.  The AV groove circumflex continues distally and bifurcates into 2 small moderate caliber posterior lateral branches. Minimal luminal irregularities.  Ramus intermedius: Large-caliber vessel that has an ostial 20% stenosis but is otherwise free of significant disease. It reaches almost all the way down to the apex, tapering distally. Minimal luminal irregularities beyond the ostial lesion.   RCA: Normal caliber, codominant vessel that is somewhat ectatic in the mid segment with diffuse mild luminal irregularities of 10-20%. The vessel terminates as the RPDA which reaches about two thirds with the apex.  PATIENT DISPOSITION:    The patient was transferred to the PACU holding area in a hemodynamicaly stable, chest pain free condition.  The patient tolerated the procedure well, and there were no complications.  The patient was stable before, during, and after the procedure.  POST-OPERATIVE DIAGNOSIS:      Diffuse mild-to-moderate disease with ectatic circumflex and RCA. The most notable lesions would be tandem lesions in the LAD. No significant lesion to be considered culprit lesion.  Disparate readings from Fick and thermal dilution cardiac output but would be considered mild to moderately reduced cardiac output/index but with preserved EF of roughly 50%.  Only Moderately Elevated LVEDP with Ooherwise Normal Right Heart Pressures suggesting absence of elevated RV pressures from OSA.  PLAN OF CARE:  The patient will return to his unit for post catheterization care.  I have stopped heparin.  I doubt that his current presentation is related to significant heart failure. Would recommend echocardiographic evaluation for better estimation of EF and valvular function.  If there are still concerning symptoms for angina, would consider noninvasive nuclear stress test evaluation to evaluate for potential ischemia in the LAD distribution. Based on the diffuse nature of disease in the LAD, I'm reluctant to put an FFR wire down this 78 year old vessel.   Leonie Man, M.D., M.S. Interventional Cardiologist   Pager # [phone]      03/08/2012 4:44 PM

## 2013-12-10 NOTE — Interval H&P Note (Signed)
History and Physical Interval Note:  12/10/2013 2:59 PM  Larry Church  has presented today for surgery, with the diagnosis of CHSET PAIN - Unstable Angina with   The various methods of treatment have been discussed with the patient and family. After consideration of risks, benefits and other options for treatment, the patient has consented to  Procedure(s): LEFT HEART CATHETERIZATION WITH CORONARY ANGIOGRAM (N/A) RIGHT HEART CATHETERIZATION +/- PCIas a surgical intervention .  The patient's history has been reviewed, patient examined, no change in status, stable for surgery.  I have reviewed the patient's chart and labs.  Questions were answered to the patient's satisfaction.    The procedure with Risks/Benefits/Alternatives and Indications was reviewed with the patient.  All questions were answered.    Risks / Complications include, but not limited to: Death, MI, CVA/TIA, VF/VT (with defibrillation), Bradycardia (need for temporary pacer placement), contrast induced nephropathy, bleeding / bruising / hematoma / pseudoaneurysm, vascular or coronary injury (with possible emergent CT or Vascular Surgery), adverse medication reactions, infection.    The patient voices understanding and agree to proceed.    Cath Lab Visit (complete for each Cath Lab visit)  Clinical Evaluation Leading to the Procedure:   ACS: Yes.    Non-ACS:    Anginal Classification: CCS IV  Anti-ischemic medical therapy: No Therapy  Non-Invasive Test Results: No non-invasive testing performed  Prior CABG: No previous CABG  HARDING,DAVID W

## 2013-12-10 NOTE — Progress Notes (Signed)
Site area: RFV Site Prior to Removal:  Level 0 Pressure Applied For:52min Manual:    Patient Status During Pull:  stable Post Pull Site:  Level 0 Post Pull Instructions Given: yes  Post Pull Pulses Present: palpable Dressing Applied:  clear Bedrest begins @ 1700 Comments: no complications

## 2013-12-10 NOTE — ED Notes (Signed)
Doctor or Nurse is to call [facility] if patient becomes pain free--so bed can get down graded.

## 2013-12-10 NOTE — ED Provider Notes (Addendum)
CSN: [id number]     Arrival date & time 12/10/13  7371 History   First MD Initiated Contact with Patient 12/10/13 0703     Chief Complaint  Patient presents with  . Chest Pain     (Consider location/radiation/quality/duration/timing/severity/associated sxs/prior Treatment) Patient is a 80 y.o. male presenting with chest pain. The history is provided by the patient.  Chest Pain Pain location:  L chest and substernal area Pain quality: aching, pressure and sharp   Pain radiates to:  L arm Pain radiates to the back: no   Pain severity:  Moderate Onset quality:  Gradual Duration:  3 months Timing:  Intermittent Progression:  Worsening Chronicity:  Recurrent Context comment:  Pt states that pain is worse with exertion.  has been intermittent for months but much worse last night and couldn't sleep due to pain and SOB Relieved by:  Rest Worsened by:  Exertion (not affected by breathing) Ineffective treatments:  None tried Associated symptoms: cough, lower extremity edema, orthopnea and shortness of breath   Associated symptoms: no abdominal pain, no anorexia, no back pain, no nausea, no palpitations, not vomiting and no weakness   Risk factors: no coronary artery disease, no diabetes mellitus, no hypertension, no immobilization, no smoking and no surgery     Past Medical History  Diagnosis Date  . GERD (gastroesophageal reflux disease)   . Degenerative arthritis   . Constipation   . Seasonal allergies    Past Surgical History  Procedure Laterality Date  . Hip surgery    . Back surgery    . Neck surgery    . Joint replacement      hip  . Eye surgery      right  . Lumbar laminectomy/decompression microdiscectomy Bilateral 12/04/2012    Procedure: BILATERAL LUMBAR LAMINECTOMY/DECOMPRESSION MICRODISCECTOMY LUMBAR FOUR-TO FIVE SACRALONE;  Surgeon: Elaina Hoops, MD;  Location: [facility];  Service: Neurosurgery;  Laterality: Bilateral;  . Cholecystectomy     No family history  on file. History  Substance Use Topics  . Smoking status: Former Smoker    Quit date: 02/16/1960  . Smokeless tobacco: Not on file  . Alcohol Use: No    Review of Systems  Respiratory: Positive for cough and shortness of breath.   Cardiovascular: Positive for chest pain and orthopnea. Negative for palpitations.  Gastrointestinal: Negative for nausea, vomiting, abdominal pain and anorexia.  Musculoskeletal: Negative for back pain.  Neurological: Negative for weakness.  All other systems reviewed and are negative.     Allergies  Penicillins  Home Medications   Prior to Admission medications   Medication Sig Start Date End Date Taking? Authorizing Provider  albuterol (PROVENTIL HFA;VENTOLIN HFA) 108 (90 BASE) MCG/ACT inhaler Inhale 2 puffs into the lungs every 6 (six) hours as needed for wheezing.    Historical Provider, MD  aspirin EC 81 MG tablet Take 81 mg by mouth daily.    Historical Provider, MD  cyclobenzaprine (FLEXERIL) 10 MG tablet Take 1 tablet (10 mg total) by mouth 3 (three) times daily as needed for muscle spasms. 12/05/12   Elaina Hoops, MD  fexofenadine (ALLEGRA) 180 MG tablet Take 180 mg by mouth every other day.     Historical Provider, MD  guaiFENesin (MUCINEX) 600 MG 12 hr tablet Take 1,200 mg by mouth 2 (two) times daily.    Historical Provider, MD  HYDROcodone-acetaminophen (NORCO/VICODIN) 5-325 MG per tablet Take 1 tablet by mouth every 4 (four) hours as needed. For pain 11/26/12  Historical Provider, MD  mometasone (NASONEX) 50 MCG/ACT nasal spray Place 2 sprays into the nose daily.    Historical Provider, MD  omeprazole (PRILOSEC) 40 MG capsule Take 40 mg by mouth every evening.    Historical Provider, MD  venlafaxine (EFFEXOR) 37.5 MG tablet Take 37.5 mg by mouth daily.    Historical Provider, MD   BP 133/75  Pulse 48  Temp(Src) 97.9 F (36.6 C) (Oral)  Ht 5\' 5"  (1.651 m)  Wt 228 lb (103.42 kg)  BMI 37.94 kg/m2  SpO2 96% Physical Exam  Nursing note  and vitals reviewed. Constitutional: He is oriented to person, place, and time. He appears well-developed and well-nourished. No distress.  HENT:  Head: Normocephalic and atraumatic.  Mouth/Throat: Oropharynx is clear and moist.  Eyes: Conjunctivae and EOM are normal. Pupils are equal, round, and reactive to light.  Neck: Normal range of motion. Neck supple.  Cardiovascular: Normal rate, regular rhythm and intact distal pulses.   No murmur heard. Pulmonary/Chest: Effort normal and breath sounds normal. No respiratory distress. He has no wheezes. He has no rales. He exhibits no tenderness.  Abdominal: Soft. He exhibits no distension. There is no tenderness. There is no rebound and no guarding.  Musculoskeletal: Normal range of motion. He exhibits edema. He exhibits no tenderness.  1+ edema bilaterally  Neurological: He is alert and oriented to person, place, and time.  Skin: Skin is warm and dry. No rash noted. No erythema.  Psychiatric: He has a normal mood and affect. His behavior is normal.    ED Course  Procedures (including critical care time) Labs Review Labs Reviewed  BASIC METABOLIC PANEL - Abnormal; Notable for the following:    Glucose, Bld 106 (*)    BUN 29 (*)    GFR calc non Af Amer 79 (*)    All other components within normal limits  TROPONIN I  PRO B NATRIURETIC PEPTIDE  CBC    Imaging Review Dg Chest Port 1 View  12/10/2013   CLINICAL DATA:  One-day history of chest pain and shortness of breath.  EXAM: PORTABLE CHEST - 1 VIEW  COMPARISON:  12/06/2012  FINDINGS: The heart is enlarged but stable. There is mild tortuosity and ectasia of the thoracic aorta. There are chronic bronchitic type interstitial lung changes and a left lower lobe process which is likely atelectasis. No large pleural effusion or pneumothorax. The bony structures are grossly intact.  IMPRESSION: Stable cardiac enlargement and tortuous thoracic aorta.  Chronic bronchitic lung changes and probable  left lower lobe atelectasis.   Electronically Signed   By: Kalman Jewels M.D.   On: 12/10/2013 07:36     EKG Interpretation   Date/Time:  Monday December 10 2013 06:42:32 EDT Ventricular Rate:  84 PR Interval:  254 QRS Duration: 100 QT Interval:  376 QTC Calculation: 444 R Axis:   -32 Text Interpretation:  Sinus rhythm with 1st degree A-V block with frequent  and consecutive Premature ventricular complexes Left axis deviation  Moderate voltage criteria for LVH, may be normal variant No significant  change since last tracing Confirmed by Maryan Rued  MD, Yi Haugan ([id number]) on  12/10/2013 7:04:20 AM      EKG Interpretation  Date/Time:  Monday December 10 2013 08:15:49 EDT Ventricular Rate:  79 PR Interval:  258 QRS Duration: 102 QT Interval:  390 QTC Calculation: 447 R Axis:   -37 Text Interpretation:  Sinus rhythm with 1st degree A-V block with frequent and consecutive Premature ventricular complexes Left axis  deviation Pulmonary disease pattern Moderate voltage criteria for LVH, may be normal variant No significant change since last tracing Confirmed by [facility]  MD, Chaneka Trefz ([id number]) on 12/10/2013 8:24:43 AM        MDM   Final diagnoses:  Unstable angina    Pt with symptoms concerning for ACS that has been intermittent for months with exertion but worse last night and SOB.  Associated symptoms include SOB without N/V.  Low suspicion for PE and pt has allergies but denies any infectious sx.  States this does not feel like allergies.  ASA given.  NTG held as pt currently is pain free. EKG without acute change.  Cath from 10-12 years ago was clean and no recent studies (such as stress test), CXR, CBC, BMP, trop pending.   8:17 AM Initial labs neg.  However pt developed pain down to the thumb and will give NTG.  Discussed with Dr. Harrington Challenger who wants to [facility] and possible cath today.  Will make NPO.  Repeat EKG with pain is neg for changes.  Blanchie Dessert, MD 12/10/13  [id number]  Blanchie Dessert, MD 12/10/13 5003

## 2013-12-10 NOTE — ED Notes (Signed)
Pt is pain free. Notified Doug at [employer] .

## 2013-12-11 DIAGNOSIS — E785 Hyperlipidemia, unspecified: Secondary | ICD-10-CM

## 2013-12-11 DIAGNOSIS — I517 Cardiomegaly: Secondary | ICD-10-CM

## 2013-12-11 MED ORDER — ATORVASTATIN CALCIUM 80 MG PO TABS: 80.0000 mg | ORAL_TABLET | Freq: Every day | ORAL | Status: DC

## 2013-12-11 MED ORDER — PERFLUTREN LIPID MICROSPHERE
INTRAVENOUS | Status: AC
  Filled 2013-12-11: qty 10

## 2013-12-11 MED ORDER — PERFLUTREN LIPID MICROSPHERE
1.0000 mL | INTRAVENOUS | Status: AC | PRN
  Filled 2013-12-11: qty 10

## 2013-12-11 MED ORDER — NITROGLYCERIN 0.4 MG SL SUBL
0.4000 mg | SUBLINGUAL_TABLET | SUBLINGUAL | Status: DC | PRN
Start: 2013-12-11 — End: 2017-11-09

## 2013-12-11 MED ORDER — METOPROLOL TARTRATE 25 MG PO TABS: 12.5000 mg | ORAL_TABLET | Freq: Two times a day (BID) | ORAL | Status: DC

## 2013-12-11 MED ORDER — METOPROLOL TARTRATE 12.5 MG HALF TABLET
12.5000 mg | ORAL_TABLET | Freq: Two times a day (BID) | ORAL | Status: DC
  Administered 2013-12-11: 12.5 mg via ORAL
  Filled 2013-12-11: qty 1

## 2013-12-11 NOTE — Progress Notes (Signed)
UR completed 

## 2013-12-11 NOTE — Progress Notes (Signed)
Echocardiogram 2D Echocardiogram has been performed.  Larry Church 12/11/2013, 10:11 AM

## 2013-12-11 NOTE — Plan of Care (Signed)
Problem: Food- and Nutrition-Related Knowledge Deficit (NB-1.1) Goal: Nutrition education Formal process to instruct or train a patient/client in a skill or to impart knowledge to help patients/clients voluntarily manage or modify food choices and eating behavior to maintain or improve health. Outcome: Completed/Met Date Met:  12/11/13 Nutrition Education Note  RD consulted for nutrition education regarding a Heart Healthy diet.   Lipid Panel     Component Value Date/Time    CHOL 168 12/10/2013 1200    TRIG 88 12/10/2013 1200    HDL 56 12/10/2013 1200    CHOLHDL 3.0 12/10/2013 1200    VLDL 18 12/10/2013 1200    LDLCALC 94 12/10/2013 1200    RD provided "Heart Healthy Nutrition Therapy" handout from the Academy of [employer]etics. Reviewed patient's dietary recall. Provided examples on ways to decrease sodium and fat intake in diet. Discouraged intake of processed foods and use of salt shaker. Encouraged fresh fruits and vegetables as well as whole grain sources of carbohydrates to maximize fiber intake. Teach back method used.  Expect good compliance.  Body mass index is 39.03 kg/(m^2). Pt meets criteria for class 2 obesity based on current BMI.  Current diet order is heart healthy, patient is consuming approximately 100% of meals at this time. Labs and medications reviewed. No further nutrition interventions warranted at this time. RD contact information provided. If additional nutrition issues arise, please re-consult RD.  Molli Barrows, RD, LDN, [place] Pager [phone] After Hours Pager [phone]

## 2013-12-11 NOTE — Progress Notes (Addendum)
Subjective: No CP.  Getting CPAP next week.  Objective: Vital signs in last 24 hours: Temp:  [97.3 F (36.3 C)-98 F (36.7 C)] 97.8 F (36.6 C) (10/27 0554) Pulse Rate:  [40-93] 77 (10/27 0554) Resp:  [18-24] 18 (10/27 0554) BP: (109-156)/(61-96) 118/80 mmHg (10/27 0554) SpO2:  [93 %-100 %] 94 % (10/27 0554) Weight:  [234 lb 9.1 oz (106.4 kg)-235 lb 6.4 oz (106.777 kg)] 234 lb 9.1 oz (106.4 kg) (10/27 0554) Last BM Date: 12/09/13  Intake/Output from previous day: 10/26 0701 - 10/27 0700 In: -  Out: 6213 [Urine:1075] Intake/Output this shift:    Medications Current Facility-Administered Medications  Medication Dose Route Frequency Provider Last Rate Last Dose  . 0.9 %  sodium chloride infusion  250 mL Intravenous PRN Leonie Man, MD      . acetaminophen (TYLENOL) tablet 650 mg  650 mg Oral Q4H PRN Fay Records, MD      . albuterol (PROVENTIL) (2.5 MG/3ML) 0.083% nebulizer solution 2.5 mg  2.5 mg Nebulization Q6H PRN Fay Records, MD      . aspirin chewable tablet 324 mg  324 mg Oral NOW Fay Records, MD       Or  . aspirin suppository 300 mg  300 mg Rectal NOW Fay Records, MD      . aspirin EC tablet 81 mg  81 mg Oral Daily Leonie Man, MD      . atorvastatin (LIPITOR) tablet 80 mg  80 mg Oral q1800 Fay Records, MD   80 mg at 12/10/13 1848  . guaiFENesin (MUCINEX) 12 hr tablet 1,200 mg  1,200 mg Oral BID Leonie Man, MD   1,200 mg at 12/10/13 2302  . heparin injection 5,000 Units  5,000 Units Subcutaneous 3 times per day Leonie Man, MD   5,000 Units at 12/11/13 0606  . ibuprofen (ADVIL,MOTRIN) tablet 400 mg  400 mg Oral Q8H PRN Leonie Man, MD      . loratadine [facility]) tablet 10 mg  10 mg Oral Daily Leonie Man, MD      . nitroGLYCERIN (NITROSTAT) SL tablet 0.4 mg  0.4 mg Sublingual Q5 min PRN Blanchie Dessert, MD   0.4 mg at 12/10/13 0865  . nitroGLYCERIN (NITROSTAT) SL tablet 0.4 mg  0.4 mg Sublingual Q5 Min x 3 PRN Fay Records, MD      .  ondansetron [facility]) injection 4 mg  4 mg Intravenous Q6H PRN Fay Records, MD      . pantoprazole (PROTONIX) EC tablet 40 mg  40 mg Oral Daily Leonie Man, MD      . sodium chloride 0.9 % injection 3 mL  3 mL Intravenous Q12H Leonie Man, MD   3 mL at 12/10/13 2302  . sodium chloride 0.9 % injection 3 mL  3 mL Intravenous PRN Leonie Man, MD      . venlafaxine [facility]) tablet 37.5 mg  37.5 mg Oral Daily Leonie Man, MD      . vitamin B-12 (CYANOCOBALAMIN) tablet 100 mcg  100 mcg Oral Daily Leonie Man, MD        PE: General appearance: alert, cooperative and no distress Lungs: clear to auscultation bilaterally Heart: irregularly irregular rhythm and Heart sounds are distant. Extremities: No LEE Pulses: 2+ and symmetric Skin: Right groin:  Nontender, no ecchymosis or hematoma.  Right wrist: no hematoma or ecchymosis.  Neurologic: Grossly normal  Lab  Results:   Recent Labs  12/10/13 0655 12/10/13 1834 12/10/13 2301  WBC 8.5 7.9 7.9  HGB 14.4 14.1 14.8  HCT 42.7 41.9 43.3  PLT 208 220 217   BMET  Recent Labs  12/10/13 0655 12/10/13 1834  NA 137  --   K 4.5  --   CL 101  --   CO2 25  --   GLUCOSE 106*  --   BUN 29*  --   CREATININE 0.90 0.81  CALCIUM 10.0  --    PT/INR  Recent Labs  12/10/13 1133  LABPROT 14.5  INR 1.12   Cholesterol  Recent Labs  12/10/13 1200  CHOL 168   Lipid Panel     Component Value Date/Time   CHOL 168 12/10/2013 1200   TRIG 88 12/10/2013 1200   HDL 56 12/10/2013 1200   CHOLHDL 3.0 12/10/2013 1200   VLDL 18 12/10/2013 1200   LDLCALC 94 12/10/2013 1200    Cardiac Panel (last 3 results)  Recent Labs  12/10/13 1117 12/10/13 1834 12/10/13 2301  TROPONINI <0.30 <0.30 <0.30      Assessment/Plan    Principal Problem:   Unstable angina   Obesity   Bigeminy/trigeminy  Plan:  SP left and right heart caths revealing diffuse mild-to-moderate disease with ectatic circumflex and RCA. The most notable  lesions would be tandem lesions in the LAD. No significant lesion to be considered culprit lesion.  Disparate readings from Fick and thermal dilution cardiac output but would be considered mild to moderately reduced cardiac output/index but with preserved EF of roughly 50%.  Only Moderately Elevated LVEDP with Ooherwise Normal Right Heart Pressures suggesting absence of elevated RV pressures from OSA.  BP stable and controlled.  Continue ASA, statin at reduced dose.  LFTs in 8 weeks.  Add low dose of lopressor BID.   DC home today after echo.        LOS: 1 day    HAGER, BRYAN PA-C 12/11/2013 7:46 AM  Patient seen and personally examined by myself.  Agree with note as outlined by Tarri Fuller PA-C.  Admitted with chest pain with cath with mild to moderate CAD.  Will try medical therapy first and if he continues to have chest pain consider outpt nuclear stress test to assess for ischemia.  Started on high dose statin.  He requests a nutrition consult before discharge today. Continue ASA/BB.  If 2D echo ok then stable for discharge today and followup as an outpt with Dr. Harrington Challenger.  Signed: Fransico Him, MD [facility] 12/11/2013

## 2013-12-11 NOTE — Progress Notes (Signed)
2D echo showed mildly reduced LVF with EF 40-50% but suboptimol pictures.  Cath with EF 50%.  OK to discharge home on BB/ASA/high dose statin.  Had 6 beats on monitor that are most likely artifact and unlikely to be VT.  Reviewed with EP.  He already is on a BB and EF 40-50%.  No history of syncope.  If he develops syncope at any time would need further eval with event monitor.    Signed: Fransico Him, MD [place]

## 2013-12-11 NOTE — Discharge Summary (Signed)
Physician Discharge Summary     Cardiologist:  Ross(new) Patient ID: Larry Church MRN: [id number] DOB/AGE: 78-Dec-1934 78 y.o.  Admit date: 12/10/2013 Discharge date: 12/11/2013  Admission Diagnoses:  Unstable angina  Discharge Diagnoses:  Principal Problem:   Unstable angina Active Problems:   Dyslipidemia   Obesity   Bigeminy   Discharged Condition: stable  Hospital Course:   Patient is a 80 yo with no reported CAD Had cardiac cath about 10 years ago, reported normal He has a history of sleep apnea, waiting on CPAP.   Over the past week he has noticed intermittent episodes of chest tightness Associated with activity Relieved with rest. Felt like couldn't get breath. Felt more fatigued.  Saturday did OK Yesterday continued to have spells One was at rest (while watching football) Some would radiated to L arm/hand.  Last night had trouble sleeping, couldn't catch breath. Came to [place] ER.  No recent trip No f/c No cough.   The patient was admitted SP left and right heart caths revealing diffuse mild-to-moderate disease with ectatic circumflex and RCA. The most notable lesions would be tandem lesions in the LAD. No significant lesion to be considered culprit lesion. Disparate readings from Fick and thermal dilution cardiac output but would be considered mild to moderately reduced cardiac output/index but with preserved EF of roughly 50%. Only Moderately Elevated LVEDP with Ooherwise Normal Right Heart Pressures suggesting absence of elevated RV pressures from OSA.  BP stable and controlled. Continue ASA, statin.  LFTs in 8 weeks. Add low dose of lopressor BID as the patient is having frequent bigeminy. He will need to monitor weight daily and eat a low sodium diet.   Had 6 beats on monitor that are most likely artifact and unlikely to be VT. Reviewed with EP. He already is on a BB and EF 40-50%. No history of syncope. If he develops syncope at any time would need further eval with  event monitor.  The patient was seen by Dr. Radford Pax who felt he was stable for DC home.  LFTs in 8 weeks.    Consults: None  Lipid Panel     Component Value Date/Time   CHOL 168 12/10/2013 1200   TRIG 88 12/10/2013 1200   HDL 56 12/10/2013 1200   CHOLHDL 3.0 12/10/2013 1200   VLDL 18 12/10/2013 1200   LDLCALC 94 12/10/2013 1200     Significant Diagnostic Studies:    RIGHT & LEFT HEART CATHETERIZATION REPORT  NAME: Larry Church MRN: [id number]  DOB: 09-13-1932 ADMIT DATE: 12/10/2013  Procedure Date: 12/10/2013  INTERVENTIONAL CARDIOLOGIST: Leonie Man, M.D., [place]  PRIMARY CARE PROVIDER: Lilian Coma., MD  PRIMARY CARDIOLOGIST: Dorris Carnes, MD  PATIENT: Larry Church is a 80 y.o. mand with prior Cath ~10 yrs ago with No significant CAD. He has OSA & obesity.  Over the past week he has noticed intermittent episodes of chest tightness Associated with activity Relieved with rest. Felt like couldn't get breath. Felt more fatigued.  Saturday did OK Yesterday continued to have spells One was at rest (while watching football) Some would radiated to L arm/hand.  Last night had trouble sleeping, couldn't catch breath. Came to [place] ER  He was seen & evaluated by Dr. Harrington Challenger, who was concerned for Unstable Angina with CHF & has requested evaluation with a Right & Left Heart Catheterization.  PRE-OPERATIVE DIAGNOSIS:  Unstable Angina  Resting dyspnea PROCEDURES PERFORMED:  Right & Left Heart Catheterization with Native Coronary  Angiography via Right Radial Artery & Right Common Femoral Vein Access.  Left Ventriculography PROCEDURE: The patient was brought to the [facility] in the fasting state and prepped and draped in the usual sterile fashion for Right groin access. A modified Allen's test was performed on the right wrist that showed adequate collateral flow for radial access. Sterile technique was used including antiseptics, cap, gloves,  gown, hand hygiene, mask and sheet. Skin prep: Chlorhexidine.  Consent: Risks of procedure as well as the alternatives and risks of each were explained to the (patient/caregiver). Consent for procedure obtained.  Time Out: Verified patient identification, verified procedure, site/side was marked, verified correct patient position, special equipment/implants available, medications/allergies/relevent history reviewed, required imaging and test results available. Performed.  Access:  Right Radial Artery: 5 Fr Sheath - Seldinger Technique using Micropuncture Angiocath Kit  Radial Cocktail: 5 mg Verapamil, 400 mcg NTG, 2 ml 2% Lidocaine in 10 ml NS  IV Heparin 5600 Units given  Right Common Femoral Vein: 7 Fr Sheath - Seldinger Technique. Right Heart Catheterization: 7Fr Gordy Councilman catheter advanced under fluoroscopy with balloon inflated to the RA, RV, then PCWP-PA for hemodynamic measurement.  Simultaneous FA & PA blood gases checked for SaO2% to calculate FICK CO/CI  Thermodilution Injections performed to calculate CO/CI  Simultaneous PCWP/LV & RV/LV pressures monitored with Angled Pigtail in LV.  Catheter removed completely out of the body with balloon deflated.  Left Heart Catheterization: 5 Fr Catheters advanced or exchanged over a Long Exchange Safety J-wire; angled pigtail catheter advanced first.  LV Hemodynamics (LV Gram): Angled pigtail  Right Coronary Artery Cineangiography: TIG 4.0 Catheter  Left Coronary Artery Cineangiography: XB LAD 4.0 Catheter  The sheath was removed in the cardiac catheterization lab with placement of a VASC Band @ 1620 hrs, 16 mL air. Nonocclusive hemostasis was obtained.  MEDICATIONS:  ANESTHESIA: Local Lidocaine 18 ml for right groin, 2 mL for right wrist  SEDATION: 1 mg IV Versed, 25 mcg IV fentanyl  Omnipaque contrast 120 mL  EBL: < 10 ml, not including ABG and VBG samples  FINDINGS:  Hemodynamics:  Findings:   SaO2%  Pressures mmHg  Mean P  mmHg  EDP    mmHg   Right Atrium   10/10   11   Right Ventricle   27/2   5   Pulmonary Artery  63  20/11  14    PCWP   18/17  17    Central Aortic  92  120/65  87    Left Ventricle   118/9   18          Cardiac Output:   Cardiac Index:    Fick  4.89   2.34    Thermodilution  3.88   1.86    Coronary Anatomy: Codominant  Left Main: Very large caliber vessel that trifurcates into the LAD, Ramus Intermedius, and Circumflex LAD: Normal caliber vessel with diffuse proximal calcification and roughly 20-30% stenosis. There are several small diagonal branches. At the takeoff of a small D1 there is roughly 40% stenosis. Beyond that in the distal vessel there is a roughly 50% focal lesion. No lesions. Flow-limiting.  Left Circumflex: Very large caliber, ectatic vessel with a ostial may be 40% stenosis followed by large ectatic segment the goes into the AV groove. There is a AV groove circumflex that courses into the left posterior lateral system and a large lateral OM branch that bifurcates distally. In the proximal eccentric segment  there is the appearance of turbulent swirling flow likely due to ectasia/near aneurysmal dilation.. There is no evidence of stenosis.  OM1: Large-caliber lateral branch with several small branches. Bifurcates distally into 2 moderate caliber branches. The proximal portion is probably still ectatic.  The AV groove circumflex continues distally and bifurcates into 2 small moderate caliber posterior lateral branches. Minimal luminal irregularities. Ramus intermedius: Large-caliber vessel that has an ostial 20% stenosis but is otherwise free of significant disease. It reaches almost all the way down to the apex, tapering distally. Minimal luminal irregularities beyond the ostial lesion. RCA: Normal caliber, codominant vessel that is somewhat ectatic in the mid segment with diffuse mild luminal irregularities of 10-20%. The vessel terminates as the RPDA which reaches about two thirds with the  apex. PATIENT DISPOSITION:  The patient was transferred to the PACU holding area in a hemodynamicaly stable, chest pain free condition.  The patient tolerated the procedure well, and there were no complications.  The patient was stable before, during, and after the procedure. POST-OPERATIVE DIAGNOSIS:  Diffuse mild-to-moderate disease with ectatic circumflex and RCA. The most notable lesions would be tandem lesions in the LAD. No significant lesion to be considered culprit lesion.  Disparate readings from Fick and thermal dilution cardiac output but would be considered mild to moderately reduced cardiac output/index but with preserved EF of roughly 50%.  Only Moderately Elevated LVEDP with Ooherwise Normal Right Heart Pressures suggesting absence of elevated RV pressures from OSA. PLAN OF CARE:  The patient will return to his unit for post catheterization care.  I have stopped heparin.  I doubt that his current presentation is related to significant heart failure. Would recommend echocardiographic evaluation for better estimation of EF and valvular function.  If there are still concerning symptoms for angina, would consider noninvasive nuclear stress test evaluation to evaluate for potential ischemia in the LAD distribution. Based on the diffuse nature of disease in the LAD, I'm reluctant to put an FFR wire down this 78 year old vessel. Leonie Man, M.D., M.S.   Study Conclusions  - HPI and indications: Rhythm is noted to be irregularly irregular. - Procedure narrative: Transthoracic echocardiography. Image quality was fair. The study was technically difficult, as a result of poor acoustic windows, poor sound wave transmission, and body habitus. Intravenous contrast (Definity) was administered. - Left ventricle: The cavity size was normal. Wall thickness was increased in a pattern of moderate LVH. Systolic function was mildly to moderately reduced. The estimated ejection fraction  was in the range of 40% to 50%, with significant ectopy and beat to beat variation. Definity contrast was administered. No apical thrombus was visualized. Incoordinate septal motion, possible due to LBBB. The study is not technically sufficient to allow evaluation of LV diastolic function. - Aortic valve: Sclerosis without stenosis. There was no regurgitation. - Mitral valve: Calcified annulus. There was trivial regurgitation. - Left atrium: The atrium was mildly dilated (36 ml/m2).  Impressions:  - LVEF 40-50%, frequent ectopy, moderate LVH, incoordinate septal motion, aortic valve sclerosis, mild LAE, unable to estimate diastolic function.    Treatments: See above  Discharge Exam: Blood pressure 136/84, pulse 92, temperature 98.7 F (37.1 C), temperature source Oral, resp. rate 18, height _0  (1.651 m), weight 234 lb 9.1 oz (106.4 kg), SpO2 93.00%.   Disposition: 01-Home or Self Care      Discharge Instructions   Diet - low sodium heart healthy    Complete by:  As directed      Discharge instructions  Complete by:  As directed   Monitor your weight every morning.  If you gain 3 pounds in 24 hours, or 5 pounds in a week, call the office for instructions.     Increase activity slowly    Complete by:  As directed             Medication List         albuterol 108 (90 BASE) MCG/ACT inhaler  Commonly known as:  PROVENTIL HFA;VENTOLIN HFA  Inhale 2 puffs into the lungs every 6 (six) hours as needed for wheezing.     aspirin EC 81 MG tablet  Take 81 mg by mouth daily.     ASTEPRO 0.15 % Soln  Generic drug:  Azelastine HCl  2 sprays by Each Nare route daily as needed.     atorvastatin 80 MG tablet  Commonly known as:  LIPITOR  Take 1 tablet (80 mg total) by mouth daily at 6 PM.     fexofenadine 60 MG tablet  Commonly known as:  ALLEGRA  Take 60 mg by mouth 2 (two) times daily.     guaiFENesin 600 MG 12 hr tablet  Commonly known as:  MUCINEX  Take 1,200  mg by mouth 2 (two) times daily.     HYDROcodone-acetaminophen 5-325 MG per tablet  Commonly known as:  NORCO/VICODIN  Take 1 tablet by mouth every 4 (four) hours as needed. For pain     ibuprofen 200 MG tablet  Commonly known as:  ADVIL,MOTRIN  Take 400 mg by mouth every 8 (eight) hours as needed for moderate pain.     metoprolol tartrate 25 MG tablet  Commonly known as:  LOPRESSOR  Take 0.5 tablets (12.5 mg total) by mouth 2 (two) times daily.     nitroGLYCERIN 0.4 MG SL tablet  Commonly known as:  NITROSTAT  Place 1 tablet (0.4 mg total) under the tongue every 5 (five) minutes x 3 doses as needed for chest pain.     omeprazole 40 MG capsule  Commonly known as:  PRILOSEC  Take 40 mg by mouth every evening.     venlafaxine 37.5 MG tablet  Commonly known as:  EFFEXOR  Take 37.5 mg by mouth daily.     vitamin B-12 100 MCG tablet  Commonly known as:  CYANOCOBALAMIN  Take 100 mcg by mouth daily.       Follow-up Information   Follow up with Ermalinda Barrios, PA-C On 12/25/2013. (9:45 AM)    Specialty:  Cardiology   Contact information:   [address] [place] [address] [phone]      Greater than 30 minutes was spent completing the patient's discharge.   SignedTarri Fuller, [place] 12/11/2013, 2:58 PM

## 2013-12-11 NOTE — Discharge Instructions (Signed)
Atorvastatin tablets °What is this medicine? °ATORVASTATIN (a TORE va sta tin) is known as a HMG-CoA reductase inhibitor or 'statin'. It lowers the level of cholesterol and triglycerides in the blood. This drug may also reduce the risk of heart attack, stroke, or other health problems in patients with risk factors for heart disease. Diet and lifestyle changes are often used with this drug. °This medicine may be used for other purposes; ask your health care provider or pharmacist if you have questions. °COMMON BRAND NAME(S): Lipitor °What should I tell my health care provider before I take this medicine? °They need to know if you have any of these conditions: °-frequently drink alcoholic beverages °-history of stroke, TIA °-kidney disease °-liver disease °-muscle aches or weakness °-other medical condition °-an unusual or allergic reaction to atorvastatin, other medicines, foods, dyes, or preservatives °-pregnant or trying to get pregnant °-breast-feeding °How should I use this medicine? °Take this medicine by mouth with a glass of water. Follow the directions on the prescription label. You can take this medicine with or without food. Take your doses at regular intervals. Do not take your medicine more often than directed. °Talk to your pediatrician regarding the use of this medicine in children. While this drug may be prescribed for children as young as 10 years old for selected conditions, precautions do apply. °Overdosage: If you think you have taken too much of this medicine contact a poison control center or emergency room at once. °NOTE: This medicine is only for you. Do not share this medicine with others. °What if I miss a dose? °If you miss a dose, take it as soon as you can. If it is almost time for your next dose, take only that dose. Do not take double or extra doses. °What may interact with this medicine? °Do not take this medicine with any of the following medications: °-red yeast  rice °-telaprevir °-telithromycin °-voriconazole °This medicine may also interact with the following medications: °-alcohol °-antiviral medicines for HIV or AIDS °-boceprevir °-certain antibiotics like clarithromycin, erythromycin, troleandomycin °-certain medicines for cholesterol like fenofibrate or gemfibrozil °-cimetidine °-clarithromycin °-colchicine °-cyclosporine °-digoxin °-male hormones, like estrogens or progestins and birth control pills °-grapefruit juice °-medicines for fungal infections like fluconazole, itraconazole, ketoconazole °-niacin °-rifampin °-spironolactone °This list may not describe all possible interactions. Give your health care provider a list of all the medicines, herbs, non-prescription drugs, or dietary supplements you use. Also tell them if you smoke, drink alcohol, or use illegal drugs. Some items may interact with your medicine. °What should I watch for while using this medicine? °Visit your doctor or health care professional for regular check-ups. You may need regular tests to make sure your liver is working properly. °Tell your doctor or health care professional right away if you get any unexplained muscle pain, tenderness, or weakness, especially if you also have a fever and tiredness. Your doctor or health care professional may tell you to stop taking this medicine if you develop muscle problems. If your muscle problems do not go away after stopping this medicine, contact your health care professional. °This drug is only part of a total heart-health program. Your doctor or a dietician can suggest a low-cholesterol and low-fat diet to help. Avoid alcohol and smoking, and keep a proper exercise schedule. °Do not use this drug if you are pregnant or breast-feeding. Serious side effects to an unborn child or to an infant are possible. Talk to your doctor or pharmacist for more information. °This medicine may affect   blood sugar levels. If you have diabetes, check with your doctor  or health care professional before you change your diet or the dose of your diabetic medicine. If you are going to have surgery tell your health care professional that you are taking this drug. What side effects may I notice from receiving this medicine? Side effects that you should report to your doctor or health care professional as soon as possible: -allergic reactions like skin rash, itching or hives, swelling of the face, lips, or tongue -dark urine -fever -joint pain -muscle cramps, pain -redness, blistering, peeling or loosening of the skin, including inside the mouth -trouble passing urine or change in the amount of urine -unusually weak or tired -yellowing of eyes or skin Side effects that usually do not require medical attention (report to your doctor or health care professional if they continue or are bothersome): -constipation -heartburn -stomach gas, pain, upset This list may not describe all possible side effects. Call your doctor for medical advice about side effects. You may report side effects to FDA at 1-800-FDA-1088. Where should I keep my medicine? Keep out of the reach of children. Store at room temperature between 20 to 25 degrees C (68 to 77 degrees F). Throw away any unused medicine after the expiration date. NOTE: This sheet is a summary. It may not cover all possible information. If you have questions about this medicine, talk to your doctor, pharmacist, or health care provider.  2015, Elsevier/Gold Standard. (2010-12-22 51:76:16)  Metoprolol tablets What is this medicine? METOPROLOL (me TOE proe lole) is a beta-blocker. Beta-blockers reduce the workload on the heart and help it to beat more regularly. This medicine is used to treat high blood pressure and to prevent chest pain. It is also used to after a heart attack and to prevent an additional heart attack from occurring. This medicine may be used for other purposes; ask your health care provider or pharmacist  if you have questions. COMMON BRAND NAME(S): Lopressor What should I tell my health care provider before I take this medicine? They need to know if you have any of these conditions: -diabetes -heart or vessel disease like slow heart rate, worsening heart failure, heart block, sick sinus syndrome or Raynaud's disease -kidney disease -liver disease -lung or breathing disease, like asthma or emphysema -pheochromocytoma -thyroid disease -an unusual or allergic reaction to metoprolol, other beta-blockers, medicines, foods, dyes, or preservatives -pregnant or trying to get pregnant -breast-feeding How should I use this medicine? Take this medicine by mouth with a drink of water. Follow the directions on the prescription label. Take this medicine immediately after meals. Take your doses at regular intervals. Do not take more medicine than directed. Do not stop taking this medicine suddenly. This could lead to serious heart-related effects. Talk to your pediatrician regarding the use of this medicine in children. Special care may be needed. Overdosage: If you think you have taken too much of this medicine contact a poison control center or emergency room at once. NOTE: This medicine is only for you. Do not share this medicine with others. What if I miss a dose? If you miss a dose, take it as soon as you can. If it is almost time for your next dose, take only that dose. Do not take double or extra doses. What may interact with this medicine? This medicine may interact with the following medications: -certain medicines for blood pressure, heart disease, irregular heart beat -certain medicines for depression like monoamine oxidase (MAO) inhibitors,  fluoxetine, or paroxetine -clonidine -dobutamine -epinephrine -isoproterenol -reserpine This list may not describe all possible interactions. Give your health care provider a list of all the medicines, herbs, non-prescription drugs, or dietary  supplements you use. Also tell them if you smoke, drink alcohol, or use illegal drugs. Some items may interact with your medicine. What should I watch for while using this medicine? Visit your doctor or health care professional for regular check ups. Contact your doctor right away if your symptoms worsen. Check your blood pressure and pulse rate regularly. Ask your health care professional what your blood pressure and pulse rate should be, and when you should contact them. You may get drowsy or dizzy. Do not drive, use machinery, or do anything that needs mental alertness until you know how this medicine affects you. Do not sit or stand up quickly, especially if you are an older patient. This reduces the risk of dizzy or fainting spells. Contact your doctor if these symptoms continue. Alcohol may interfere with the effect of this medicine. Avoid alcoholic drinks. What side effects may I notice from receiving this medicine? Side effects that you should report to your doctor or health care professional as soon as possible: -allergic reactions like skin rash, itching or hives -cold or numb hands or feet -depression -difficulty breathing -faint -fever with sore throat -irregular heartbeat, chest pain -rapid weight gain -swollen legs or ankles Side effects that usually do not require medical attention (report to your doctor or health care professional if they continue or are bothersome): -anxiety or nervousness -change in sex drive or performance -dry skin -headache -nightmares or trouble sleeping -short term memory loss -stomach upset or diarrhea -unusually tired This list may not describe all possible side effects. Call your doctor for medical advice about side effects. You may report side effects to FDA at 1-800-FDA-1088. Where should I keep my medicine? Keep out of the reach of children. Store at room temperature between 15 and 30 degrees C (59 and 86 degrees F). Throw away any unused  medicine after the expiration date. NOTE: This sheet is a summary. It may not cover all possible information. If you have questions about this medicine, talk to your doctor, pharmacist, or health care provider.  2015, Elsevier/Gold Standard. (2012-10-06 14:40:36)  Nitroglycerin sublingual tablets What is this medicine? NITROGLYCERIN (nye troe GLI ser in) is a type of vasodilator. It relaxes blood vessels, increasing the blood and oxygen supply to your heart. This medicine is used to relieve chest pain caused by angina. It is also used to prevent chest pain before activities like climbing stairs, going outdoors in cold weather, or sexual activity. This medicine may be used for other purposes; ask your health care provider or pharmacist if you have questions. COMMON BRAND NAME(S): Nitroquick, Nitrostat, Nitrotab What should I tell my health care provider before I take this medicine? They need to know if you have any of these conditions: -anemia -head injury, recent stroke, or bleeding in the brain -liver disease -previous heart attack -an unusual or allergic reaction to nitroglycerin, other medicines, foods, dyes, or preservatives -pregnant or trying to get pregnant -breast-feeding How should I use this medicine? Take this medicine by mouth as needed. At the first sign of an angina attack (chest pain or tightness) place one tablet under your tongue. You can also take this medicine 5 to 10 minutes before an event likely to produce chest pain. Follow the directions on the prescription label. Let the tablet dissolve under the tongue.  Do not swallow whole. Replace the dose if you accidentally swallow it. It will help if your mouth is not dry. Saliva around the tablet will help it to dissolve more quickly. Do not eat or drink, smoke or chew tobacco while a tablet is dissolving. If you are not better within 5 minutes after taking ONE dose of nitroglycerin, call 9-1-1 immediately to seek emergency medical  care. Do not take more than 3 nitroglycerin tablets over 15 minutes. If you take this medicine often to relieve symptoms of angina, your doctor or health care professional may provide you with different instructions to manage your symptoms. If symptoms do not go away after following these instructions, it is important to call 9-1-1 immediately. Do not take more than 3 nitroglycerin tablets over 15 minutes. Talk to your pediatrician regarding the use of this medicine in children. Special care may be needed. Overdosage: If you think you have taken too much of this medicine contact a poison control center or emergency room at once. NOTE: This medicine is only for you. Do not share this medicine with others. What if I miss a dose? This does not apply. This medicine is only used as needed. What may interact with this medicine? Do not take this medicine with any of the following medications: -certain migraine medicines like ergotamine and dihydroergotamine (DHE) -medicines used to treat erectile dysfunction like sildenafil, tadalafil, and vardenafil -riociguat This medicine may also interact with the following medications: -alteplase -aspirin -heparin -medicines for high blood pressure -medicines for mental depression -other medicines used to treat angina -phenothiazines like chlorpromazine, mesoridazine, prochlorperazine, thioridazine This list may not describe all possible interactions. Give your health care provider a list of all the medicines, herbs, non-prescription drugs, or dietary supplements you use. Also tell them if you smoke, drink alcohol, or use illegal drugs. Some items may interact with your medicine. What should I watch for while using this medicine? Tell your doctor or health care professional if you feel your medicine is no longer working. Keep this medicine with you at all times. Sit or lie down when you take your medicine to prevent falling if you feel dizzy or faint after using  it. Try to remain calm. This will help you to feel better faster. If you feel dizzy, take several deep breaths and lie down with your feet propped up, or bend forward with your head resting between your knees. You may get drowsy or dizzy. Do not drive, use machinery, or do anything that needs mental alertness until you know how this drug affects you. Do not stand or sit up quickly, especially if you are an older patient. This reduces the risk of dizzy or fainting spells. Alcohol can make you more drowsy and dizzy. Avoid alcoholic drinks. Do not treat yourself for coughs, colds, or pain while you are taking this medicine without asking your doctor or health care professional for advice. Some ingredients may increase your blood pressure. What side effects may I notice from receiving this medicine? Side effects that you should report to your doctor or health care professional as soon as possible: -blurred vision -dry mouth -skin rash -sweating -the feeling of extreme pressure in the head -unusually weak or tired Side effects that usually do not require medical attention (report to your doctor or health care professional if they continue or are bothersome): -flushing of the face or neck -headache -irregular heartbeat, palpitations -nausea, vomiting This list may not describe all possible side effects. Call your doctor for  medical advice about side effects. You may report side effects to FDA at 1-800-FDA-1088. Where should I keep my medicine? Keep out of the reach of children. Store at room temperature between 20 and 25 degrees C (68 and 77 degrees F). Store in [employer]. Protect from light and moisture. Keep tightly closed. Throw away any unused medicine after the expiration date. NOTE: This sheet is a summary. It may not cover all possible information. If you have questions about this medicine, talk to your doctor, pharmacist, or health care provider.  2015, Elsevier/Gold Standard.  (2012-11-30 17:57:36)

## 2013-12-11 NOTE — Discharge Summary (Signed)
Agree with discharge summary as outlined by Tarri Fuller, PA.

## 2013-12-12 MED FILL — Perflutren Lipid Microsphere IV Susp 1.1 MG/ML: INTRAVENOUS | Qty: 10 | Status: AC

## 2013-12-25 ENCOUNTER — Encounter: Payer: Self-pay | Admitting: Physician Assistant

## 2013-12-25 ENCOUNTER — Ambulatory Visit (INDEPENDENT_AMBULATORY_CARE_PROVIDER_SITE_OTHER): Payer: Medicare Other | Admitting: Physician Assistant

## 2013-12-25 VITALS — BP 112/64 | HR 69 | Ht 65.0 in | Wt 241.0 lb

## 2013-12-25 DIAGNOSIS — I493 Ventricular premature depolarization: Secondary | ICD-10-CM

## 2013-12-25 DIAGNOSIS — R0789 Other chest pain: Secondary | ICD-10-CM

## 2013-12-25 DIAGNOSIS — I255 Ischemic cardiomyopathy: Secondary | ICD-10-CM

## 2013-12-25 DIAGNOSIS — I10 Essential (primary) hypertension: Secondary | ICD-10-CM | POA: Insufficient documentation

## 2013-12-25 DIAGNOSIS — E785 Hyperlipidemia, unspecified: Secondary | ICD-10-CM

## 2013-12-25 DIAGNOSIS — I2510 Atherosclerotic heart disease of native coronary artery without angina pectoris: Secondary | ICD-10-CM | POA: Insufficient documentation

## 2013-12-25 NOTE — Assessment & Plan Note (Signed)
But pressure controlled

## 2013-12-25 NOTE — Patient Instructions (Signed)
Your physician recommends that you continue on your current medications as directed. Please refer to the Current Medication list given to you today.  Your physician recommends that you return for a FASTING lipid profile and lft in 6 weeks  You physician recommends that you follow a low fat/low sodium diet  Your physician recommends that your enroll in a weight loss program  Your physician recommends that you schedule a follow-up appointment in: 2 months with Dr.Ross    Low-Sodium Eating Plan Sodium raises blood pressure and causes water to be held in the body. Getting less sodium from food will help lower your blood pressure, reduce any swelling, and protect your heart, liver, and kidneys. We get sodium by adding salt (sodium chloride) to food. Most of our sodium comes from canned, boxed, and frozen foods. Restaurant foods, fast foods, and pizza are also very high in sodium. Even if you take medicine to lower your blood pressure or to reduce fluid in your body, getting less sodium from your food is important. WHAT IS MY PLAN? Most people should limit their sodium intake to 2,300 mg a day. Your health care provider recommends that you limit your sodium intake to __________ a day.  WHAT DO I NEED TO KNOW ABOUT THIS EATING PLAN? For the low-sodium eating plan, you will follow these general guidelines:  Choose foods with a % Daily Value for sodium of less than 5% (as listed on the food label).   Use salt-free seasonings or herbs instead of table salt or sea salt.   Check with your health care provider or pharmacist before using salt substitutes.   Eat fresh foods.  Eat more vegetables and fruits.  Limit canned vegetables. If you do use them, rinse them well to decrease the sodium.   Limit cheese to 1 oz (28 g) per day.   Eat lower-sodium products, often labeled as "lower sodium" or "no salt added."  Avoid foods that contain monosodium glutamate (MSG). MSG is sometimes added to  [place] food and some canned foods.  Check food labels (Nutrition Facts labels) on foods to learn how much sodium is in one serving.  Eat more home-cooked food and less restaurant, buffet, and fast food.  When eating at a restaurant, ask that your food be prepared with less salt or none, if possible.  HOW DO I READ FOOD LABELS FOR SODIUM INFORMATION? The Nutrition Facts label lists the amount of sodium in one serving of the food. If you eat more than one serving, you must multiply the listed amount of sodium by the number of servings. Food labels may also identify foods as:  Sodium free--Less than 5 mg in a serving.  Very low sodium--35 mg or less in a serving.  Low sodium--140 mg or less in a serving.  Light in sodium--50% less sodium in a serving. For example, if a food that usually has 300 mg of sodium is changed to become light in sodium, it will have 150 mg of sodium.  Reduced sodium--25% less sodium in a serving. For example, if a food that usually has 400 mg of sodium is changed to reduced sodium, it will have 300 mg of sodium. WHAT FOODS CAN I EAT? Grains Low-sodium cereals, including oats, puffed wheat and rice, and shredded wheat cereals. Low-sodium crackers. Unsalted rice and pasta. Lower-sodium bread.  Vegetables Frozen or fresh vegetables. Low-sodium or reduced-sodium canned vegetables. Low-sodium or reduced-sodium tomato sauce and paste. Low-sodium or reduced-sodium tomato and vegetable juices.  Fruits Fresh,  frozen, and canned fruit. Fruit juice.  Meat and Other Protein Products Low-sodium canned tuna and salmon. Fresh or frozen meat, poultry, seafood, and fish. Lamb. Unsalted nuts. Dried beans, peas, and lentils without added salt. Unsalted canned beans. Homemade soups without salt. Eggs.  Dairy Milk. Soy milk. Ricotta cheese. Low-sodium or reduced-sodium cheeses. Yogurt.  Condiments Fresh and dried herbs and spices. Salt-free seasonings. Onion and garlic  powders. Low-sodium varieties of mustard and ketchup. Lemon juice.  Fats and Oils Reduced-sodium salad dressings. Unsalted butter.  Other Unsalted popcorn and pretzels.  The items listed above may not be a complete list of recommended foods or beverages. Contact your dietitian for more options. WHAT FOODS ARE NOT RECOMMENDED? Grains Instant hot cereals. Bread stuffing, pancake, and biscuit mixes. Croutons. Seasoned rice or pasta mixes. Noodle soup cups. Boxed or frozen macaroni and cheese. Self-rising flour. Regular salted crackers. Vegetables Regular canned vegetables. Regular canned tomato sauce and paste. Regular tomato and vegetable juices. Frozen vegetables in sauces. Salted french fries. Olives. [name] Fava. Relishes. Sauerkraut. Salsa. Meat and Other Protein Products Salted, canned, smoked, spiced, or pickled meats, seafood, or fish. Bacon, ham, sausage, hot dogs, corned beef, chipped beef, and packaged luncheon meats. Salt pork. Jerky. Pickled herring. Anchovies, regular canned tuna, and sardines. Salted nuts. Dairy Processed cheese and cheese spreads. Cheese curds. Blue cheese and cottage cheese. Buttermilk.  Condiments Onion and garlic salt, seasoned salt, table salt, and sea salt. Canned and packaged gravies. Worcestershire sauce. Tartar sauce. Barbecue sauce. Teriyaki sauce. Soy sauce, including reduced sodium. Steak sauce. Fish sauce. Oyster sauce. Cocktail sauce. Horseradish. Regular ketchup and mustard. Meat flavorings and tenderizers. Bouillon cubes. Hot sauce. Tabasco sauce. Marinades. Taco seasonings. Relishes. Fats and Oils Regular salad dressings. Salted butter. Margarine. Ghee. Bacon fat.  Other Potato and tortilla chips. Corn chips and puffs. Salted popcorn and pretzels. Canned or dried soups. Pizza. Frozen entrees and pot pies.  The items listed above may not be a complete list of foods and beverages to avoid. Contact your dietitian for more information. Document  Released: 07/24/2001 Document Revised: 02/06/2013 Document Reviewed: 12/06/2012 [facility] Patient Information 2015 [place], [place]. This information is not intended to replace advice given to you by your health care provider. Make sure you discuss any questions you have with your health care provider.

## 2013-12-25 NOTE — Assessment & Plan Note (Signed)
EF 45-50%. Currently compensated. 2 g sodium diet.

## 2013-12-25 NOTE — Assessment & Plan Note (Signed)
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 Most notable lesions would be tandem lesions in the LAD. There weren't any significant lesions to be considered culprit. If he has recurrent chest pain it was recommended that he have a stress Myoview in the future to rule out ischemia. He is having occasional twinges that sound more musculoskeletal to me. It occurs when he pulls himself up into his truck with his left arm. It is fleeting and short lived. Continue Lipitor, aspirin, metoprolol. Followup with Dr. Harrington Challenger in 2 months.

## 2013-12-25 NOTE — Assessment & Plan Note (Signed)
Had along discussion with the patient concerning the importance of a weight loss program.also recommend an exercise program such as swimming or walking.

## 2013-12-25 NOTE — Progress Notes (Signed)
[name] is a 80 year old patient of Dr. Dorris Carnes who was admitted to the hospital with chest pain and dyspnea. Cardiac catheterization revealed diffuse mild to moderate disease with ectatic circumflex and RCA. Most notable lesions would be tandem lesions in the LAD but there was no significant culprit lesion. There was to spare readings from defect and thermal dilution cardiac output but could be considered mild to moderate reduced cardiac output/index a preserved EF roughly 50%. 2-D echo EF 45-50%. Patient also had bigeminy and 6 beats of what was thought to be artifact. Beta blocker recommended. There is no history of syncope he has 40-50% and EPS did not feel any further workup was needed at this time.  The patient comes in today saying he has occasional twinges of left-sided chest pain that are fleeting. He mostly notices it when he pulls himself up with his left arm to get in to his stroke. He denies any chest tightness, pressure, dyspnea, dyspnea on exertion, palpitations,dizziness, or presyncope.He says that when she's actually feel like a mosquito bite.  Allergies  Allergen Reactions  . Bee Venom   . Penicillins Swelling    Throat Swelling     Current Outpatient Prescriptions  Medication Sig Dispense Refill  . albuterol (PROVENTIL HFA;VENTOLIN HFA) 108 (90 BASE) MCG/ACT inhaler Inhale 2 puffs into the lungs every 6 (six) hours as needed for wheezing.    Marland Kitchen aspirin EC 81 MG tablet Take 81 mg by mouth daily.    Marland Kitchen atorvastatin (LIPITOR) 80 MG tablet Take 1 tablet (80 mg total) by mouth daily at 6 PM. 30 tablet 5  . Azelastine HCl (ASTEPRO) 0.15 % SOLN 2 sprays by Each Nare route daily as needed.    . fexofenadine (ALLEGRA) 60 MG tablet Take 60 mg by mouth 2 (two) times daily.    Marland Kitchen guaiFENesin (MUCINEX) 600 MG 12 hr tablet Take 1,200 mg by mouth 2 (two) times daily.    Marland Kitchen HYDROcodone-acetaminophen (NORCO/VICODIN) 5-325 MG per tablet Take 1 tablet by mouth every 4 (four) hours  as needed. For pain    . ibuprofen (ADVIL,MOTRIN) 200 MG tablet Take 400 mg by mouth every 8 (eight) hours as needed for moderate pain.    . metoprolol tartrate (LOPRESSOR) 25 MG tablet Take 0.5 tablets (12.5 mg total) by mouth 2 (two) times daily. 60 tablet 5  . nitroGLYCERIN (NITROSTAT) 0.4 MG SL tablet Place 1 tablet (0.4 mg total) under the tongue every 5 (five) minutes x 3 doses as needed for chest pain. 25 tablet 12  . omeprazole (PRILOSEC) 40 MG capsule Take 40 mg by mouth every evening.    . venlafaxine (EFFEXOR) 37.5 MG tablet Take 37.5 mg by mouth daily.    . vitamin B-12 (CYANOCOBALAMIN) 100 MCG tablet Take 100 mcg by mouth daily.     No current facility-administered medications for this visit.    Past Medical History  Diagnosis Date  . GERD (gastroesophageal reflux disease)   . Degenerative arthritis   . Constipation   . Seasonal allergies     Past Surgical History  Procedure Laterality Date  . Hip surgery    . Back surgery    . Neck surgery    . Joint replacement      hip  . Eye surgery      right  . Lumbar laminectomy/decompression microdiscectomy Bilateral 12/04/2012    Procedure: BILATERAL LUMBAR LAMINECTOMY/DECOMPRESSION MICRODISCECTOMY LUMBAR FOUR-TO FIVE SACRALONE;  Surgeon: Elaina Hoops, MD;  Location: [facility]  NEURO ORS;  Service: Neurosurgery;  Laterality: Bilateral;  . Cholecystectomy      No family history on file.  History   Social History  . Marital Status: Married    Spouse Name: N/A    Number of Children: N/A  . Years of Education: N/A   Occupational History  . Not on file.   Social History Main Topics  . Smoking status: Former Smoker    Quit date: 02/16/1960  . Smokeless tobacco: Not on file  . Alcohol Use: No  . Drug Use: No  . Sexual Activity: Not on file   Other Topics Concern  . Not on file   Social History Narrative    ROS:see history of present illness otherwise negative  BP 112/64 mmHg  Pulse 69  Ht 5\' 5"  (1.651 m)  Wt 241  lb (109.317 kg)  BMI 40.10 kg/m2  SpO2 98%  PHYSICAL EXAM: Obese, in no acute distress. Neck: No JVD, HJR, Bruit, or thyroid enlargement  Lungs: decreased breath sounds but No tachypnea, clear without wheezing, rales, or rhonchi  Cardiovascular: RRR, PMI not displaced, Normal S1 and S2, no murmurs, gallops, bruit, thrill, or heave.  Abdomen: BS normal. Soft without organomegaly, masses, lesions or tenderness.  Extremities: without cyanosis, clubbing or edema. Good distal pulses bilateral  SKin: Warm, no lesions or rashes   Musculoskeletal: No deformities  Neuro: no focal signs   Wt Readings from Last 3 Encounters:  12/25/13 241 lb (109.317 kg)  12/11/13 234 lb 9.1 oz (106.4 kg)  12/22/12 230 lb (104.327 kg)    Lab Results  Component Value Date   WBC 7.9 12/10/2013   HGB 14.8 12/10/2013   HCT 43.3 12/10/2013   PLT 217 12/10/2013   GLUCOSE 106* 12/10/2013   CHOL 168 12/10/2013   TRIG 88 12/10/2013   HDL 56 12/10/2013   LDLCALC 94 12/10/2013   ALT 17 12/06/2012   AST 19 12/06/2012   NA 137 12/10/2013   K 4.5 12/10/2013   CL 101 12/10/2013   CREATININE 0.81 12/10/2013   BUN 29* 12/10/2013   CO2 25 12/10/2013   INR 1.12 12/10/2013    KY[facility]:CWCBJS sinus rhythm with first degree AV block, LVH, T-wave inversion leads 3 and aVF, no acute change from prior tracing  2-D echo 12/11/13 Study Conclusions  - HPI and indications: Rhythm is noted to be irregularly irregular. - Procedure narrative: Transthoracic echocardiography. Image   quality was fair. The study was technically difficult, as a   result of poor acoustic windows, poor sound wave transmission,   and body habitus. Intravenous contrast (Definity) was   administered. - Left ventricle: The cavity size was normal. Wall thickness was   increased in a pattern of moderate LVH. Systolic function was   mildly to moderately reduced. The estimated ejection fraction was   in the range of 40% to 50%, with significant  ectopy and beat to   beat variation. Definity contrast was administered. No apical   thrombus was visualized. Incoordinate septal motion, possible due   to LBBB. The study is not technically sufficient to allow   evaluation of LV diastolic function. - Aortic valve: Sclerosis without stenosis. There was no   regurgitation. - Mitral valve: Calcified annulus. There was trivial regurgitation. - Left atrium: The atrium was mildly dilated (36 ml/m2).  Impressions:  - LVEF 40-50%, frequent ectopy, moderate LVH, incoordinate septal   motion, aortic valve sclerosis, mild LAE, unable to estimate   diastolic function.  Cardiac catheterization  11/2013 POST-OPERATIVE DIAGNOSIS:    Diffuse mild-to-moderate disease with ectatic circumflex and RCA. The most notable lesions would be tandem lesions in the LAD. No significant lesion to be considered culprit lesion.  Disparate readings from Fick and thermal dilution cardiac output but would be considered mild to moderately reduced cardiac output/index but with preserved EF of roughly 50%.  Only Moderately Elevated LVEDP with Ooherwise Normal Right Heart Pressures suggesting absence of elevated RV pressures from OSA.  PLAN OF CARE:  The patient will return to his unit for post catheterization care.  I have stopped heparin.  I doubt that his current presentation is related to significant heart failure. Would recommend echocardiographic evaluation for better estimation of EF and valvular function.  If there are still concerning symptoms for angina, would consider noninvasive nuclear stress test evaluation to evaluate for potential ischemia in the LAD distribution. Based on the diffuse nature of disease in the LAD, I'm reluctant to put an FFR wire down this 78 year old vessel.   Leonie Man, M.D., M.S. Interventional Cardiologist

## 2013-12-25 NOTE — Assessment & Plan Note (Signed)
This seems better on beta blocker

## 2014-01-24 ENCOUNTER — Encounter (HOSPITAL_COMMUNITY): Payer: Self-pay | Admitting: Cardiology

## 2014-02-05 ENCOUNTER — Other Ambulatory Visit (INDEPENDENT_AMBULATORY_CARE_PROVIDER_SITE_OTHER): Payer: Medicare Other | Admitting: *Deleted

## 2014-02-05 DIAGNOSIS — E785 Hyperlipidemia, unspecified: Secondary | ICD-10-CM

## 2014-02-05 LAB — LIPID PANEL
Cholesterol: 102 mg/dL (ref 0–200)
HDL: 38.4 mg/dL — ABNORMAL LOW (ref >39.00)
LDL Cholesterol: 46 mg/dL (ref 0–99)
NonHDL: 63.6
Total CHOL/HDL Ratio: 3
Triglycerides: 89 mg/dL (ref 0.0–149.0)
VLDL: 17.8 mg/dL (ref 0.0–40.0)

## 2014-02-05 LAB — HEPATIC FUNCTION PANEL
ALT: 25 U/L (ref 0–53)
AST: 24 U/L (ref 0–37)
Albumin: 3.6 g/dL (ref 3.5–5.2)
Alkaline Phosphatase: 94 U/L (ref 39–117)
Bilirubin, Direct: 0 mg/dL (ref 0.0–0.3)
Total Bilirubin: 0.9 mg/dL (ref 0.2–1.2)
Total Protein: 6.8 g/dL (ref 6.0–8.3)

## 2014-02-14 ENCOUNTER — Telehealth: Payer: Self-pay

## 2014-02-14 NOTE — Telephone Encounter (Signed)
Patient called back about questions regarding his labs. Patient stated " My nurse at home (his wife) wanted to know if my medications needed changing because of my low HDL." Informed patient that there were no changes to his medications, and that exercise is basicely the only thing that can help improve his HDL. Patient verbalized understanding. Also, informed patient if he would like [place] to discuss this with his wife, he could fill out a DPR in our office. Therefore, this would allow [place]  to release information to his wife. Patient declined to do this at this time.

## 2014-02-19 ENCOUNTER — Encounter (HOSPITAL_BASED_OUTPATIENT_CLINIC_OR_DEPARTMENT_OTHER): Payer: Self-pay | Admitting: Emergency Medicine

## 2014-02-19 ENCOUNTER — Inpatient Hospital Stay (HOSPITAL_BASED_OUTPATIENT_CLINIC_OR_DEPARTMENT_OTHER)
Admission: EM | Admit: 2014-02-19 | Discharge: 2014-02-21 | DRG: 087 | Disposition: A | Discharge status: Home / Self Care | Payer: Medicare Other | Attending: Internal Medicine | Admitting: Internal Medicine

## 2014-02-19 ENCOUNTER — Emergency Department (HOSPITAL_BASED_OUTPATIENT_CLINIC_OR_DEPARTMENT_OTHER): Payer: Medicare Other

## 2014-02-19 DIAGNOSIS — Z7982 Long term (current) use of aspirin: Secondary | ICD-10-CM

## 2014-02-19 DIAGNOSIS — S61412A Laceration without foreign body of left hand, initial encounter: Secondary | ICD-10-CM | POA: Diagnosis present

## 2014-02-19 DIAGNOSIS — S0181XA Laceration without foreign body of other part of head, initial encounter: Secondary | ICD-10-CM | POA: Diagnosis present

## 2014-02-19 DIAGNOSIS — Z91030 Bee allergy status: Secondary | ICD-10-CM

## 2014-02-19 DIAGNOSIS — Y9389 Activity, other specified: Secondary | ICD-10-CM

## 2014-02-19 DIAGNOSIS — Z880 Allergy status to penicillin: Secondary | ICD-10-CM

## 2014-02-19 DIAGNOSIS — S066X9A Traumatic subarachnoid hemorrhage with loss of consciousness of unspecified duration, initial encounter: Secondary | ICD-10-CM | POA: Diagnosis present

## 2014-02-19 DIAGNOSIS — W109XXA Fall (on) (from) unspecified stairs and steps, initial encounter: Secondary | ICD-10-CM | POA: Diagnosis present

## 2014-02-19 DIAGNOSIS — K219 Gastro-esophageal reflux disease without esophagitis: Secondary | ICD-10-CM | POA: Diagnosis present

## 2014-02-19 DIAGNOSIS — R51 Headache: Secondary | ICD-10-CM | POA: Diagnosis not present

## 2014-02-19 DIAGNOSIS — W19XXXA Unspecified fall, initial encounter: Secondary | ICD-10-CM

## 2014-02-19 DIAGNOSIS — Z9049 Acquired absence of other specified parts of digestive tract: Secondary | ICD-10-CM | POA: Diagnosis present

## 2014-02-19 DIAGNOSIS — S066XAA Traumatic subarachnoid hemorrhage with loss of consciousness status unknown, initial encounter: Secondary | ICD-10-CM | POA: Diagnosis present

## 2014-02-19 DIAGNOSIS — Y9289 Other specified places as the place of occurrence of the external cause: Secondary | ICD-10-CM

## 2014-02-19 DIAGNOSIS — S066X0A Traumatic subarachnoid hemorrhage without loss of consciousness, initial encounter: Principal | ICD-10-CM | POA: Diagnosis present

## 2014-02-19 DIAGNOSIS — M1990 Unspecified osteoarthritis, unspecified site: Secondary | ICD-10-CM | POA: Diagnosis present

## 2014-02-19 DIAGNOSIS — Z87891 Personal history of nicotine dependence: Secondary | ICD-10-CM

## 2014-02-19 DIAGNOSIS — Z96649 Presence of unspecified artificial hip joint: Secondary | ICD-10-CM | POA: Diagnosis present

## 2014-02-19 LAB — BASIC METABOLIC PANEL
Anion gap: 8 (ref 5–15)
BUN: 27 mg/dL — ABNORMAL HIGH (ref 6–23)
CO2: 26 mmol/L (ref 19–32)
Calcium: 9.6 mg/dL (ref 8.4–10.5)
Chloride: 101 mEq/L (ref 96–112)
Creatinine, Ser: 0.97 mg/dL (ref 0.50–1.35)
GFR calc Af Amer: 89 mL/min — ABNORMAL LOW (ref >90)
GFR calc non Af Amer: 76 mL/min — ABNORMAL LOW (ref >90)
Glucose, Bld: 140 mg/dL — ABNORMAL HIGH (ref 70–99)
Potassium: 3.7 mmol/L (ref 3.5–5.1)
Sodium: 135 mmol/L (ref 135–145)

## 2014-02-19 LAB — CBC WITH DIFFERENTIAL/PLATELET
Basophils Absolute: 0.1 10*3/uL (ref 0.0–0.1)
Basophils Relative: 1 % (ref 0–1)
Eosinophils Absolute: 0.5 10*3/uL (ref 0.0–0.7)
Eosinophils Relative: 4 % (ref 0–5)
HCT: 45.4 % (ref 39.0–52.0)
Hemoglobin: 14.9 g/dL (ref 13.0–17.0)
Lymphocytes Relative: 12 % (ref 12–46)
Lymphs Abs: 1.3 10*3/uL (ref 0.7–4.0)
MCH: 31.6 pg (ref 26.0–34.0)
MCHC: 32.8 g/dL (ref 30.0–36.0)
MCV: 96.2 fL (ref 78.0–100.0)
Monocytes Absolute: 0.9 10*3/uL (ref 0.1–1.0)
Monocytes Relative: 9 % (ref 3–12)
Neutro Abs: 7.8 10*3/uL — ABNORMAL HIGH (ref 1.7–7.7)
Neutrophils Relative %: 74 % (ref 43–77)
Platelets: 267 10*3/uL (ref 150–400)
RBC: 4.72 MIL/uL (ref 4.22–5.81)
RDW: 15 % (ref 11.5–15.5)
WBC: 10.6 10*3/uL — ABNORMAL HIGH (ref 4.0–10.5)

## 2014-02-19 LAB — TROPONIN I: Troponin I: <0.03 ng/mL (ref <0.031)

## 2014-02-19 LAB — PROTIME-INR
INR: 1.02 (ref 0.00–1.49)
Prothrombin Time: 13.4 seconds (ref 11.6–15.2)

## 2014-02-19 NOTE — ED Notes (Signed)
Patient was walking up stairs and fell and hit right head. Large Hematoma to his right forehead. Skin tear to left 1st finger.

## 2014-02-19 NOTE — ED Provider Notes (Signed)
CSN: [id number]     Arrival date & time 02/19/14  1945 History  This chart was scribed for Debby Freiberg, MD by Ladene Artist, ED Scribe. The patient was seen in room MH06/MH06. Patient's care was started at 7:54 PM.   Chief Complaint  Patient presents with  . Head Injury  . Fall  . Facial Laceration   The history is provided by the patient. No language interpreter was used.   HPI Comments: Larry Church is a 81 y.o. male, with a h/o GERD, degenerative arthritis, who presents to the Emergency Department complaining of fall that occurred tonight. Pt states that he was walking up stairs when turned and fell down the stairs.  He is not sure if he tripped or why he fell. He reports associated lacerations to his R forehead and L hand. Pt denies LOC, light-headedness, nausea, vomiting, visual disturbances, abdominal pain, neck pain, back pain. No h/o MI, DM, HTN. Pt was seen in ED on 12/10/13 for 3 months of intermittent chest pain that required cardiac catheterization and follow-up with cardiologist. He is currently taking ASA 81 daily. Last tetanus unknown.   Past Medical History  Diagnosis Date  . GERD (gastroesophageal reflux disease)   . Degenerative arthritis   . Constipation   . Seasonal allergies    Past Surgical History  Procedure Laterality Date  . Hip surgery    . Back surgery    . Neck surgery    . Joint replacement      hip  . Eye surgery      right  . Lumbar laminectomy/decompression microdiscectomy Bilateral 12/04/2012    Procedure: BILATERAL LUMBAR LAMINECTOMY/DECOMPRESSION MICRODISCECTOMY LUMBAR FOUR-TO FIVE SACRALONE;  Surgeon: Elaina Hoops, MD;  Location: [place] NEURO ORS;  Service: Neurosurgery;  Laterality: Bilateral;  . Cholecystectomy    . Left heart catheterization with coronary angiogram N/A 12/10/2013    Procedure: LEFT HEART CATHETERIZATION WITH CORONARY ANGIOGRAM;  Surgeon: Leonie Man, MD;  Location: [facility] CATH LAB;  Service: Cardiovascular;  Laterality: N/A;  .  Cardiac catheterization     History reviewed. No pertinent family history. History  Substance Use Topics  . Smoking status: Former Smoker    Quit date: 02/16/1960  . Smokeless tobacco: Not on file  . Alcohol Use: No    Review of Systems  Eyes: Negative for visual disturbance.  Gastrointestinal: Negative for nausea, vomiting and abdominal pain.  Musculoskeletal: Negative for back pain and neck pain.  Skin: Positive for wound.  Neurological: Negative for syncope and light-headedness.  All other systems reviewed and are negative.  Allergies  Bee venom and Penicillins  Home Medications   Prior to Admission medications   Medication Sig Start Date End Date Taking? Authorizing Provider  albuterol (PROVENTIL HFA;VENTOLIN HFA) 108 (90 BASE) MCG/ACT inhaler Inhale 2 puffs into the lungs every 6 (six) hours as needed for wheezing.    Historical Provider, MD  aspirin EC 81 MG tablet Take 81 mg by mouth daily.    Historical Provider, MD  atorvastatin (LIPITOR) 80 MG tablet Take 1 tablet (80 mg total) by mouth daily at 6 PM. 12/11/13   Brett Canales, PA-C  Azelastine HCl (ASTEPRO) 0.15 % SOLN 2 sprays by Each Nare route daily as needed.    Historical Provider, MD  fexofenadine (ALLEGRA) 60 MG tablet Take 60 mg by mouth 2 (two) times daily.    Historical Provider, MD  guaiFENesin (MUCINEX) 600 MG 12 hr tablet Take 1,200 mg by mouth 2 (two)  times daily.    Historical Provider, MD  HYDROcodone-acetaminophen (NORCO/VICODIN) 5-325 MG per tablet Take 1 tablet by mouth every 4 (four) hours as needed. For pain 11/26/12   Historical Provider, MD  ibuprofen (ADVIL,MOTRIN) 200 MG tablet Take 400 mg by mouth every 8 (eight) hours as needed for moderate pain.    Historical Provider, MD  metoprolol tartrate (LOPRESSOR) 25 MG tablet Take 0.5 tablets (12.5 mg total) by mouth 2 (two) times daily. 12/11/13   Brett Canales, PA-C  nabumetone (RELAFEN) 750 MG tablet  01/31/14   Historical Provider, MD  NASONEX 50  MCG/ACT nasal spray  01/25/14   Historical Provider, MD  nitroGLYCERIN (NITROSTAT) 0.4 MG SL tablet Place 1 tablet (0.4 mg total) under the tongue every 5 (five) minutes x 3 doses as needed for chest pain. 12/11/13   Brett Canales, PA-C  Olopatadine HCl 0.6 % SOLN  01/25/14   Historical Provider, MD  omeprazole (PRILOSEC) 40 MG capsule Take 40 mg by mouth every evening.    Historical Provider, MD  venlafaxine (EFFEXOR) 37.5 MG tablet Take 37.5 mg by mouth daily.    Historical Provider, MD  venlafaxine XR (EFFEXOR-XR) 75 MG 24 hr capsule  01/01/14   Historical Provider, MD  vitamin B-12 (CYANOCOBALAMIN) 100 MCG tablet Take 100 mcg by mouth daily.    Historical Provider, MD   BP 143/83 mmHg  Pulse 85  Temp(Src) 98.7 F (37.1 C) (Oral)  Resp 17  SpO2 95% Physical Exam  Constitutional: He is oriented to person, place, and time. He appears well-developed and well-nourished.  HENT:  Head: Normocephalic.  Large hematoma over R forehead with abrasions, no lacerations necessitating closure  Eyes: Conjunctivae and EOM are normal.  Neck: Normal range of motion. Neck supple.  Cardiovascular: Normal rate, regular rhythm and normal heart sounds.   Pulmonary/Chest: Effort normal and breath sounds normal. No respiratory distress.  Abdominal: He exhibits no distension. There is no tenderness. There is no rebound and no guarding.  Musculoskeletal: Normal range of motion.  Neurological: He is alert and oriented to person, place, and time. He has normal strength and normal reflexes. No cranial nerve deficit or sensory deficit. Gait normal. GCS eye subscore is 4. GCS verbal subscore is 5. GCS motor subscore is 6.  Skin: Skin is warm and dry.  Vitals reviewed.   ED Course  Procedures (including critical care time) DIAGNOSTIC STUDIES: Oxygen Saturation is 95% on RA, adequate by my interpretation.    COORDINATION OF CARE: 7:59 PM-Discussed treatment plan which includes head CT, CXR and diagnostic lab  work with pt at bedside and pt agreed to plan.   Labs Review Labs Reviewed  CBC WITH DIFFERENTIAL - Abnormal; Notable for the following:    WBC 10.6 (*)    Neutro Abs 7.8 (*)    All other components within normal limits  BASIC METABOLIC PANEL - Abnormal; Notable for the following:    Glucose, Bld 140 (*)    BUN 27 (*)    GFR calc non Af Amer 76 (*)    GFR calc Af Amer 89 (*)    All other components within normal limits  MRSA PCR SCREENING  TROPONIN I  PROTIME-INR   Imaging Review Dg Chest 2 View  02/19/2014   CLINICAL DATA:  Recent fall with head injury, initial encounter  EXAM: CHEST  2 VIEW  COMPARISON:  12/10/2013  FINDINGS: Cardiac shadow is stable. Postsurgical changes in the cervical spine are seen. Diffuse interstitial changes and  scarring are noted. No focal infiltrate or sizable effusion is seen. No acute bony abnormality is noted.  IMPRESSION: Chronic interstitial changes without acute abnormality.   Electronically Signed   By: Inez Catalina M.D.   On: 02/19/2014 20:29   Ct Head Wo Contrast  02/19/2014   CLINICAL DATA:  Fall injury, pt states that he was walking down stairs and fell, pt denies loc, pt with swelling above right eye  EXAM: CT HEAD WITHOUT CONTRAST  TECHNIQUE: Contiguous axial images were obtained from the base of the skull through the vertex without intravenous contrast.  COMPARISON:  09/06/2009  FINDINGS: There is a large right supraorbital and frontal scalp hematoma. There subarachnoid blood predominantly in the sylvian fissure and extending adjacent to the posterior temporal lobe. No intraventricular hemorrhage. No midline shift. No herniation. No focal parenchymal edema, mass, or mass effect. Acute infarct may be inapparent on non contrast CT. Bone windows reveal no calvarial lesion.  IMPRESSION: 1. Subarachnoid hemorrhage centered in the right sylvian fissure, without complicating features. While this may be posttraumatic, ruptured intracranial aneurysm could have a  similar appearance. Critical Value/emergent results were called by telephone at the time of interpretation on 02/19/2014 at 8:27 pm to Dr. Debby Freiberg , who verbally acknowledged these results. If clinical concern persists, MRA or CTA head may be useful for further assessment. 2. Right frontal scalp hematoma.   Electronically Signed   By: Arne Cleveland M.D.   On: 02/19/2014 20:29   Dg Hand Complete Left  02/19/2014   CLINICAL DATA:  Fall this evening with hand pain, initial encounter  EXAM: LEFT HAND - COMPLETE 3+ VIEW  COMPARISON:  None.  FINDINGS: No acute fracture or dislocation is noted. Mild degenerative changes in the interphalangeal joints are seen. No gross soft tissue abnormality is noted.  IMPRESSION: Degenerative change without acute abnormality.   Electronically Signed   By: Inez Catalina M.D.   On: 02/19/2014 20:28    EKG Interpretation None      MDM   Final diagnoses:  Fall    81 y.o. male with pertinent PMH of GERD presents with near syncopal fall with hit to head, no LOC  Exam as above without focal neuro deficit, and large hematoma over r scalp.  No lacerations necessitating care right now.  CT head with SAH, pt did not have antecedent ha or chest pain.  Spoke with [facility] who did not recommend acute intervention at this time.  Admitted to hospitalist.   I have reviewed all laboratory and imaging studies if ordered as above  1. Fall   2. Traumatic SAH  I personally performed the services described in this documentation, which was scribed in my presence. The recorded information has been reviewed and is accurate.    Debby Freiberg, MD 02/21/14 [phone]

## 2014-02-20 ENCOUNTER — Encounter (HOSPITAL_COMMUNITY): Payer: Self-pay | Admitting: *Deleted

## 2014-02-20 DIAGNOSIS — S0181XA Laceration without foreign body of other part of head, initial encounter: Secondary | ICD-10-CM | POA: Diagnosis present

## 2014-02-20 DIAGNOSIS — S066X0A Traumatic subarachnoid hemorrhage without loss of consciousness, initial encounter: Secondary | ICD-10-CM | POA: Diagnosis present

## 2014-02-20 DIAGNOSIS — Z9049 Acquired absence of other specified parts of digestive tract: Secondary | ICD-10-CM | POA: Diagnosis present

## 2014-02-20 DIAGNOSIS — Z96649 Presence of unspecified artificial hip joint: Secondary | ICD-10-CM | POA: Diagnosis present

## 2014-02-20 DIAGNOSIS — K219 Gastro-esophageal reflux disease without esophagitis: Secondary | ICD-10-CM | POA: Diagnosis present

## 2014-02-20 DIAGNOSIS — S61412A Laceration without foreign body of left hand, initial encounter: Secondary | ICD-10-CM | POA: Diagnosis present

## 2014-02-20 DIAGNOSIS — S066X9A Traumatic subarachnoid hemorrhage with loss of consciousness of unspecified duration, initial encounter: Secondary | ICD-10-CM | POA: Diagnosis present

## 2014-02-20 DIAGNOSIS — Y9289 Other specified places as the place of occurrence of the external cause: Secondary | ICD-10-CM | POA: Diagnosis not present

## 2014-02-20 DIAGNOSIS — W109XXA Fall (on) (from) unspecified stairs and steps, initial encounter: Secondary | ICD-10-CM | POA: Diagnosis present

## 2014-02-20 DIAGNOSIS — Y9389 Activity, other specified: Secondary | ICD-10-CM | POA: Diagnosis not present

## 2014-02-20 DIAGNOSIS — S066XAA Traumatic subarachnoid hemorrhage with loss of consciousness status unknown, initial encounter: Secondary | ICD-10-CM | POA: Diagnosis present

## 2014-02-20 DIAGNOSIS — Z91030 Bee allergy status: Secondary | ICD-10-CM | POA: Diagnosis not present

## 2014-02-20 DIAGNOSIS — R51 Headache: Secondary | ICD-10-CM | POA: Diagnosis present

## 2014-02-20 DIAGNOSIS — M1990 Unspecified osteoarthritis, unspecified site: Secondary | ICD-10-CM | POA: Diagnosis present

## 2014-02-20 DIAGNOSIS — Z880 Allergy status to penicillin: Secondary | ICD-10-CM | POA: Diagnosis not present

## 2014-02-20 DIAGNOSIS — Z87891 Personal history of nicotine dependence: Secondary | ICD-10-CM | POA: Diagnosis not present

## 2014-02-20 DIAGNOSIS — Z7982 Long term (current) use of aspirin: Secondary | ICD-10-CM | POA: Diagnosis not present

## 2014-02-20 DIAGNOSIS — W19XXXA Unspecified fall, initial encounter: Secondary | ICD-10-CM

## 2014-02-20 LAB — MRSA PCR SCREENING: MRSA by PCR: NEGATIVE

## 2014-02-20 MED ORDER — METOPROLOL TARTRATE 12.5 MG HALF TABLET
12.5000 mg | ORAL_TABLET | Freq: Two times a day (BID) | ORAL | Status: DC
  Administered 2014-02-20 – 2014-02-21 (×4): 12.5 mg via ORAL
  Filled 2014-02-20 (×5): qty 1

## 2014-02-20 MED ORDER — ATORVASTATIN CALCIUM 80 MG PO TABS
80.0000 mg | ORAL_TABLET | Freq: Every day | ORAL | Status: DC
  Administered 2014-02-20: 80 mg via ORAL
  Filled 2014-02-20 (×2): qty 1

## 2014-02-20 MED ORDER — HYDROCODONE-ACETAMINOPHEN 5-325 MG PO TABS: 1.0000 | ORAL_TABLET | Freq: Four times a day (QID) | ORAL | Status: DC | PRN

## 2014-02-20 MED ORDER — VENLAFAXINE HCL 37.5 MG PO TABS
37.5000 mg | ORAL_TABLET | Freq: Every day | ORAL | Status: DC
  Administered 2014-02-20 – 2014-02-21 (×2): 37.5 mg via ORAL
  Filled 2014-02-20 (×2): qty 1

## 2014-02-20 MED ORDER — ACETAMINOPHEN 325 MG PO TABS
650.0000 mg | ORAL_TABLET | Freq: Four times a day (QID) | ORAL | Status: DC | PRN
  Administered 2014-02-21: 650 mg via ORAL
  Filled 2014-02-20: qty 2

## 2014-02-20 MED ORDER — IBUPROFEN 400 MG PO TABS
400.0000 mg | ORAL_TABLET | Freq: Three times a day (TID) | ORAL | Status: DC | PRN
  Administered 2014-02-20 (×2): 400 mg via ORAL
  Filled 2014-02-20 (×3): qty 1

## 2014-02-20 MED ORDER — PANTOPRAZOLE SODIUM 40 MG PO TBEC
80.0000 mg | DELAYED_RELEASE_TABLET | Freq: Every day | ORAL | Status: DC
  Administered 2014-02-20 – 2014-02-21 (×2): 80 mg via ORAL
  Filled 2014-02-20 (×2): qty 2

## 2014-02-20 MED ORDER — ALBUTEROL SULFATE (2.5 MG/3ML) 0.083% IN NEBU: 3.0000 mL | INHALATION_SOLUTION | Freq: Four times a day (QID) | RESPIRATORY_TRACT | Status: DC | PRN

## 2014-02-20 NOTE — H&P (Signed)
[facility] History and Physical  Larry Church VZD:[id number] DOB: Jul 04, 1932 DOA: 02/19/2014  Referring physician: EDP PCP: Lilian Coma., MD   Chief Complaint: Head injury, fall   HPI: Larry Church is a 81 y.o. male who presents to the ED after he fell going up stairs.  Fall occurred due to a trip, he fell on concrete and hit his head.  No LOC due to head injury.  Reports lacerations of R forehead and L hand.  Presented to ED at [facility].  Has mild 3/10 pain at the R forehead laceration site, has no headache what so ever he says.  Taking ASA 81 daily and no other blood thinners.  Review of Systems: Systems reviewed.  As above, otherwise negative  Past Medical History  Diagnosis Date  . GERD (gastroesophageal reflux disease)   . Degenerative arthritis   . Constipation   . Seasonal allergies    Past Surgical History  Procedure Laterality Date  . Hip surgery    . Back surgery    . Neck surgery    . Joint replacement      hip  . Eye surgery      right  . Lumbar laminectomy/decompression microdiscectomy Bilateral 12/04/2012    Procedure: BILATERAL LUMBAR LAMINECTOMY/DECOMPRESSION MICRODISCECTOMY LUMBAR FOUR-TO FIVE SACRALONE;  Surgeon: Elaina Hoops, MD;  Location: [facility];  Service: Neurosurgery;  Laterality: Bilateral;  . Cholecystectomy    . Left heart catheterization with coronary angiogram N/A 12/10/2013    Procedure: LEFT HEART CATHETERIZATION WITH CORONARY ANGIOGRAM;  Surgeon: Leonie Man, MD;  Location: [facility] CATH LAB;  Service: Cardiovascular;  Laterality: N/A;  . Cardiac catheterization     Social History:  reports that he quit smoking about 54 years ago. He does not have any smokeless tobacco history on file. He reports that he does not drink alcohol or use illicit drugs.  Allergies  Allergen Reactions  . Bee Venom   . Penicillins Swelling    Throat Swelling    History reviewed. No pertinent family history.   Prior to Admission medications    Medication Sig Start Date End Date Taking? Authorizing Provider  albuterol (PROVENTIL HFA;VENTOLIN HFA) 108 (90 BASE) MCG/ACT inhaler Inhale 2 puffs into the lungs every 6 (six) hours as needed for wheezing.    Historical Provider, MD  aspirin EC 81 MG tablet Take 81 mg by mouth daily.    Historical Provider, MD  atorvastatin (LIPITOR) 80 MG tablet Take 1 tablet (80 mg total) by mouth daily at 6 PM. 12/11/13   Brett Canales, PA-C  Azelastine HCl (ASTEPRO) 0.15 % SOLN 2 sprays by Each Nare route daily as needed.    Historical Provider, MD  fexofenadine (ALLEGRA) 60 MG tablet Take 60 mg by mouth 2 (two) times daily.    Historical Provider, MD  guaiFENesin (MUCINEX) 600 MG 12 hr tablet Take 1,200 mg by mouth 2 (two) times daily.    Historical Provider, MD  HYDROcodone-acetaminophen (NORCO/VICODIN) 5-325 MG per tablet Take 1 tablet by mouth every 4 (four) hours as needed. For pain 11/26/12   Historical Provider, MD  ibuprofen (ADVIL,MOTRIN) 200 MG tablet Take 400 mg by mouth every 8 (eight) hours as needed for moderate pain.    Historical Provider, MD  metoprolol tartrate (LOPRESSOR) 25 MG tablet Take 0.5 tablets (12.5 mg total) by mouth 2 (two) times daily. 12/11/13   Brett Canales, PA-C  nitroGLYCERIN (NITROSTAT) 0.4 MG SL tablet Place 1 tablet (0.4 mg total) under  the tongue every 5 (five) minutes x 3 doses as needed for chest pain. 12/11/13   Brett Canales, PA-C  omeprazole (PRILOSEC) 40 MG capsule Take 40 mg by mouth every evening.    Historical Provider, MD  venlafaxine (EFFEXOR) 37.5 MG tablet Take 37.5 mg by mouth daily.    Historical Provider, MD  vitamin B-12 (CYANOCOBALAMIN) 100 MCG tablet Take 100 mcg by mouth daily.    Historical Provider, MD   Physical Exam: Filed Vitals:   02/20/14 0047  BP: 155/74  Pulse: 82  Temp: 98 F (36.7 C)  Resp:     BP 155/74 mmHg  Pulse 82  Temp(Src) 98 F (36.7 C) (Oral)  Resp 16  Ht 5\' 5"  (1.651 m)  Wt 107.366 kg (236 lb 11.2 oz)  BMI 39.39  kg/m2  SpO2 96%  General Appearance:    Alert, oriented, no distress, appears stated age  Head:    Normocephalic, has R forehead lac with hematoma, has periorbital bruising  Eyes:    PERRL, sclera non-icteric, has chronic diplopia in his R eye that is not new or changed from todays injury he states.  He wears prism glasses for this at baseline.     Nose:   Nares without drainage or epistaxis. Mucosa, turbinates normal  Throat:   Moist mucous membranes. Oropharynx without erythema or exudate.  Neck:   Supple. No carotid bruits.  No thyromegaly.  No lymphadenopathy.   Back:     No CVA tenderness, no spinal tenderness  Lungs:     Clear to auscultation bilaterally, without wheezes, rhonchi or rales  Chest wall:    No tenderness to palpitation  Heart:    Regular rate and rhythm without murmurs, gallops, rubs  Abdomen:     Soft, non-tender, nondistended, normal bowel sounds, no organomegaly  Genitalia:    deferred  Rectal:    deferred  Extremities:   No clubbing, cyanosis or edema.  Pulses:   2+ and symmetric all extremities  Skin:   Skin color, texture, turgor normal, no rashes or lesions  Lymph nodes:   Cervical, supraclavicular, and axillary nodes normal  Neurologic:   CNII-XII intact. Normal strength, sensation and reflexes      throughout    Labs on Admission:  Basic Metabolic Panel:  Recent Labs Lab 02/19/14 2006  NA 135  K 3.7  CL 101  CO2 26  GLUCOSE 140*  BUN 27*  CREATININE 0.97  CALCIUM 9.6   Liver Function Tests: No results for input(s): AST, ALT, ALKPHOS, BILITOT, PROT, ALBUMIN in the last 168 hours. No results for input(s): LIPASE, AMYLASE in the last 168 hours. No results for input(s): AMMONIA in the last 168 hours. CBC:  Recent Labs Lab 02/19/14 2006  WBC 10.6*  NEUTROABS 7.8*  HGB 14.9  HCT 45.4  MCV 96.2  PLT 267   Cardiac Enzymes:  Recent Labs Lab 02/19/14 2006  TROPONINI <0.03    BNP (last 3 results)  Recent Labs  12/10/13 0655   PROBNP 323.3   CBG: No results for input(s): GLUCAP in the last 168 hours.  Radiological Exams on Admission: Dg Chest 2 View  02/19/2014   CLINICAL DATA:  Recent fall with head injury, initial encounter  EXAM: CHEST  2 VIEW  COMPARISON:  12/10/2013  FINDINGS: Cardiac shadow is stable. Postsurgical changes in the cervical spine are seen. Diffuse interstitial changes and scarring are noted. No focal infiltrate or sizable effusion is seen. No acute bony abnormality is noted.  IMPRESSION: Chronic interstitial changes without acute abnormality.   Electronically Signed   By: Inez Catalina M.D.   On: 02/19/2014 20:29   Ct Head Wo Contrast  02/19/2014   CLINICAL DATA:  Fall injury, pt states that he was walking down stairs and fell, pt denies loc, pt with swelling above right eye  EXAM: CT HEAD WITHOUT CONTRAST  TECHNIQUE: Contiguous axial images were obtained from the base of the skull through the vertex without intravenous contrast.  COMPARISON:  09/06/2009  FINDINGS: There is a large right supraorbital and frontal scalp hematoma. There subarachnoid blood predominantly in the sylvian fissure and extending adjacent to the posterior temporal lobe. No intraventricular hemorrhage. No midline shift. No herniation. No focal parenchymal edema, mass, or mass effect. Acute infarct may be inapparent on non contrast CT. Bone windows reveal no calvarial lesion.  IMPRESSION: 1. Subarachnoid hemorrhage centered in the right sylvian fissure, without complicating features. While this may be posttraumatic, ruptured intracranial aneurysm could have a similar appearance. Critical Value/emergent results were called by telephone at the time of interpretation on 02/19/2014 at 8:27 pm to Dr. Debby Freiberg , who verbally acknowledged these results. If clinical concern persists, MRA or CTA head may be useful for further assessment. 2. Right frontal scalp hematoma.   Electronically Signed   By: Arne Cleveland M.D.   On: 02/19/2014 20:29    Dg Hand Complete Left  02/19/2014   CLINICAL DATA:  Fall this evening with hand pain, initial encounter  EXAM: LEFT HAND - COMPLETE 3+ VIEW  COMPARISON:  None.  FINDINGS: No acute fracture or dislocation is noted. Mild degenerative changes in the interphalangeal joints are seen. No gross soft tissue abnormality is noted.  IMPRESSION: Degenerative change without acute abnormality.   Electronically Signed   By: Inez Catalina M.D.   On: 02/19/2014 20:28    EKG: Independently reviewed.  Assessment/Plan Active Problems:   Fall   Traumatic subarachnoid hemorrhage   1. Fall and Traumatic subarachnoid hemorrhage - Spoke with Dr. Kathyrn Sheriff who has reviewed images remotely: 1. Given the fact that there is clear association with trauma, patient does not need further imaging to rule out aneurysm rupture in his opinion. 2. Given the lack of neurologic symptoms, unless patient's status changes no plans for follow up imaging he states. 3. Q4H neuro checks 4. He states that it is unlikely patient will require intervention, so patient may be put on diet. 5. No blood thinners 6. Treat BP if SBP gets above 160.    Code Status: Full Code  Family Communication: Wife at bedside Disposition Plan: Admit to inpatient   Time spent: 34 min  Raisa Ditto M. [facility] Pager [phone]  If 7AM-7PM, please contact the day team taking care of the patient [email or web address] Password [email or web address] 02/20/2014, 1:49 AM

## 2014-02-20 NOTE — Progress Notes (Signed)
Admitted after midnight. Chart reviewed. Patient examined. PT eval. Neuro checks.   Doree Barthel, MD

## 2014-02-20 NOTE — Progress Notes (Signed)
Utilization review completed.  

## 2014-02-21 MED ORDER — SENNOSIDES-DOCUSATE SODIUM 8.6-50 MG PO TABS
2.0000 | ORAL_TABLET | Freq: Every day | ORAL | Status: DC
  Administered 2014-02-21: 2 via ORAL
  Filled 2014-02-21: qty 2

## 2014-02-21 MED ORDER — ACETAMINOPHEN 325 MG PO TABS: 650.0000 mg | ORAL_TABLET | Freq: Four times a day (QID) | ORAL | Status: DC | PRN

## 2014-02-21 MED ORDER — TRAMADOL HCL 50 MG PO TABS: 50.0000 mg | ORAL_TABLET | Freq: Two times a day (BID) | ORAL | Status: DC | PRN

## 2014-02-21 NOTE — Care Management Note (Signed)
    Page 1 of 1   02/21/2014     11:37:10 AM CARE MANAGEMENT NOTE 02/21/2014  Patient:  Larry Church, Larry Church   Account Number:  [id number]  Date Initiated:  02/21/2014  Documentation initiated by:  Marvetta Gibbons  Subjective/Objective Assessment:   Pt admitted with [facility] from fall     Action/Plan:   PTA pt lived at home with wife- PT eval   Anticipated DC Date:  02/22/2014   Anticipated DC Plan:  [place]  CM consult  OP Neuro Rehab      Choice offered to / List presented to:             Status of service:  Completed, signed off Medicare Important Message given?   (If response is "NO", the following Medicare IM given date fields will be blank) Date Medicare IM given:   Medicare IM given by:   Date Additional Medicare IM given:   Additional Medicare IM given by:    Discharge Disposition:  HOME/SELF CARE  Per UR Regulation:  Reviewed for med. necessity/level of care/duration of stay  If discussed at [place] of Stay Meetings, dates discussed:    Comments:  02/21/14- 58- Marvetta Gibbons RN, BSN [phone] Referral for outpt vestibular rehab- spoke with pt and wife at bedside regarding referral to Neuro rehab for oupt PT- pt agreeable to referral- info given on rehab address and phone #- referral sent via epic to [facility] Neuro rehab for outpt PT for vestibular rehab.

## 2014-02-21 NOTE — Discharge Summary (Signed)
Physician Discharge Summary  IBAN UTZ IEP:[id number] DOB: 1932/11/26 DOA: 02/19/2014  PCP: Lilian Coma., MD  Admit date: 02/19/2014 Discharge date: 02/21/2014  Time spent: greater than 30 minutes  Recommendations for Outpatient Follow-up:  1. Outpatient PT arranged  Discharge Diagnoses:  Active Problems:   Fall   Traumatic subarachnoid hemorrhage   Discharge Condition: stable  Filed Weights   02/20/14 0047  Weight: 107.366 kg (236 lb 11.2 oz)    History of present illness:   81 y.o. male who presents to the ED after he fell going up stairs. Fall occurred due to a trip, he fell on concrete and hit his head. No LOC due to head injury. Reports lacerations of R forehead and L hand. Presented to ED at [facility]. Has mild 3/10 pain at the R forehead laceration site, has no headache what so ever he says. Taking ASA 81 daily and no other blood thinners.  Hospital Course:  Admitted to hospitalists. Admitting MD discussed case with neurosurgeon on call who recommended observation. Patient had stable neuro exams. Aspirin, NSAIDs stopped. Worked with PT, who recommend outpatient PT.   Procedures: none Consultations:  none  Discharge Exam: Filed Vitals:   02/21/14 0944  BP: 104/58  Pulse: 87  Temp:   Resp:     General: a and o HEENT: ecchymoses, swelling right periorbital Cardiovascular: RRR Respiratory: CTA Neuro: nonfocal  Discharge Instructions   Discharge Instructions    Call MD for:  extreme fatigue    Complete by:  As directed      Call MD for:  persistant nausea and vomiting    Complete by:  As directed      Diet - low sodium heart healthy    Complete by:  As directed      Discharge instructions    Complete by:  As directed   Antibiotic ointment to abrasions covered by bandage daily until healed.  Hold aspirin for a month. Hold ibuprofen for a month.     Driving Restrictions    Complete by:  As directed   No driving for one week     Increase activity  slowly    Complete by:  As directed           Current Discharge Medication List    START taking these medications   Details  acetaminophen (TYLENOL) 325 MG tablet Take 2 tablets (650 mg total) by mouth every 6 (six) hours as needed for mild pain.    traMADol (ULTRAM) 50 MG tablet Take 1 tablet (50 mg total) by mouth every 12 (twelve) hours as needed for moderate pain. Qty: 15 tablet, Refills: 0      CONTINUE these medications which have NOT CHANGED   Details  albuterol (PROVENTIL HFA;VENTOLIN HFA) 108 (90 BASE) MCG/ACT inhaler Inhale 2 puffs into the lungs every 6 (six) hours as needed for wheezing.    atorvastatin (LIPITOR) 80 MG tablet Take 1 tablet (80 mg total) by mouth daily at 6 PM. Qty: 30 tablet, Refills: 5    Azelastine HCl (ASTEPRO) 0.15 % SOLN 2 sprays by Each Nare route daily as needed.    fexofenadine (ALLEGRA) 60 MG tablet Take 60 mg by mouth 2 (two) times daily.    guaiFENesin (MUCINEX) 600 MG 12 hr tablet Take 1,200 mg by mouth 2 (two) times daily.    metoprolol tartrate (LOPRESSOR) 25 MG tablet Take 0.5 tablets (12.5 mg total) by mouth 2 (two) times daily. Qty: 60 tablet, Refills: 5  NASONEX 50 MCG/ACT nasal spray Refills: 4    nitroGLYCERIN (NITROSTAT) 0.4 MG SL tablet Place 1 tablet (0.4 mg total) under the tongue every 5 (five) minutes x 3 doses as needed for chest pain. Qty: 25 tablet, Refills: 12    Olopatadine HCl 0.6 % SOLN Refills: 4    omeprazole (PRILOSEC) 40 MG capsule Take 40 mg by mouth every evening.    venlafaxine (EFFEXOR) 37.5 MG tablet Take 37.5 mg by mouth daily.    venlafaxine XR (EFFEXOR-XR) 75 MG 24 hr capsule Refills: 10    vitamin B-12 (CYANOCOBALAMIN) 100 MCG tablet Take 100 mcg by mouth daily.      STOP taking these medications     aspirin EC 81 MG tablet      HYDROcodone-acetaminophen (NORCO/VICODIN) 5-325 MG per tablet      ibuprofen (ADVIL,MOTRIN) 200 MG tablet      nabumetone (RELAFEN) 750 MG tablet         Allergies  Allergen Reactions  . Bee Venom   . Penicillins Swelling    Throat Swelling   Follow-up Information    Follow up with [facility].   Specialty:  Rehabilitation   Why:  referral sent for outpt PT for vestibular rehab- they will call to f/u on referral   Contact information:   [address] [place] [phone]       The results of significant diagnostics from this hospitalization (including imaging, microbiology, ancillary and laboratory) are listed below for reference.    Significant Diagnostic Studies: Dg Chest 2 View  02/19/2014   CLINICAL DATA:  Recent fall with head injury, initial encounter  EXAM: CHEST  2 VIEW  COMPARISON:  12/10/2013  FINDINGS: Cardiac shadow is stable. Postsurgical changes in the cervical spine are seen. Diffuse interstitial changes and scarring are noted. No focal infiltrate or sizable effusion is seen. No acute bony abnormality is noted.  IMPRESSION: Chronic interstitial changes without acute abnormality.   Electronically Signed   By: Inez Catalina M.D.   On: 02/19/2014 20:29   Ct Head Wo Contrast  02/19/2014   CLINICAL DATA:  Fall injury, pt states that he was walking down stairs and fell, pt denies loc, pt with swelling above right eye  EXAM: CT HEAD WITHOUT CONTRAST  TECHNIQUE: Contiguous axial images were obtained from the base of the skull through the vertex without intravenous contrast.  COMPARISON:  09/06/2009  FINDINGS: There is a large right supraorbital and frontal scalp hematoma. There subarachnoid blood predominantly in the sylvian fissure and extending adjacent to the posterior temporal lobe. No intraventricular hemorrhage. No midline shift. No herniation. No focal parenchymal edema, mass, or mass effect. Acute infarct may be inapparent on non contrast CT. Bone windows reveal no calvarial lesion.  IMPRESSION: 1. Subarachnoid hemorrhage centered in  the right sylvian fissure, without complicating features. While this may be posttraumatic, ruptured intracranial aneurysm could have a similar appearance. Critical Value/emergent results were called by telephone at the time of interpretation on 02/19/2014 at 8:27 pm to Dr. Debby Freiberg , who verbally acknowledged these results. If clinical concern persists, MRA or CTA head may be useful for further assessment. 2. Right frontal scalp hematoma.   Electronically Signed   By: Arne Cleveland M.D.   On: 02/19/2014 20:29   Dg Hand Complete Left  02/19/2014   CLINICAL DATA:  Fall this evening with hand pain, initial encounter  EXAM: LEFT HAND - COMPLETE 3+ VIEW  COMPARISON:  None.  FINDINGS: No acute fracture or dislocation is noted. Mild degenerative changes in the interphalangeal joints are seen. No gross soft tissue abnormality is noted.  IMPRESSION: Degenerative change without acute abnormality.   Electronically Signed   By: Inez Catalina M.D.   On: 02/19/2014 20:28    Microbiology: Recent Results (from the past 240 hour(s))  MRSA PCR Screening     Status: None   Collection Time: 02/20/14  2:52 AM  Result Value Ref Range Status   MRSA by PCR NEGATIVE NEGATIVE Final    Comment:        The GeneXpert MRSA Assay (FDA approved for NASAL specimens only), is one component of a comprehensive MRSA colonization surveillance program. It is not intended to diagnose MRSA infection nor to guide or monitor treatment for MRSA infections.      Labs: Basic Metabolic Panel:  Recent Labs Lab 02/19/14 2006  NA 135  K 3.7  CL 101  CO2 26  GLUCOSE 140*  BUN 27*  CREATININE 0.97  CALCIUM 9.6   Liver Function Tests: No results for input(s): AST, ALT, ALKPHOS, BILITOT, PROT, ALBUMIN in the last 168 hours. No results for input(s): LIPASE, AMYLASE in the last 168 hours. No results for input(s): AMMONIA in the last 168 hours. CBC:  Recent Labs Lab 02/19/14 2006  WBC 10.6*  NEUTROABS 7.8*  HGB 14.9   HCT 45.4  MCV 96.2  PLT 267   Cardiac Enzymes:  Recent Labs Lab 02/19/14 2006  TROPONINI <0.03   BNP: BNP (last 3 results)  Recent Labs  12/10/13 0655  PROBNP 323.3   CBG: No results for input(s): GLUCAP in the last 168 hours.     SignedDelfina Redwood  [facility] 02/21/2014, 1:27 PM

## 2014-02-21 NOTE — Evaluation (Signed)
Physical Therapy Evaluation Patient Details Name: Larry Church MRN: [id number] DOB: 11/08/1932 Today's Date: 02/21/2014   History of Present Illness  Larry Church is a 81 y.o. male who presents to the ED after he fell going up stairs.  Fall occurred due to a trip, he fell on concrete and hit his head.  No LOC due to head injury.  Reports lacerations of R forehead and L hand  Clinical Impression  Pt very pleasant with swollen right eye limiting vision as well as decreased neck ROM at baseline from prior spinal surgeries. Pt moving well but noted balance deficits with report of 2 falls on stairs in the last week. Pt also noted 2-3 second dizziness with rolling left since admission. Pt able to perform visual tracking with left eye but with rapid head turns noted 3-5 beat nystagmus with repeated loss of target. Performed modified Dix Hallpike secondary to limited neck ROM without ability to fully elicit nystagmus and treated with Epley with brief period of 2 beat nystagmus and brief 1-2 sec dizziness with return to sitting. Pt will benefit from continued acute therapy to maximize balance, mobility and address vestibular dysfunction as well as continued follow up with OPPT.     Follow Up Recommendations Outpatient PT (vestibular as well as neuro therapy)    Equipment Recommendations  None recommended by PT    Recommendations for Other Services       Precautions / Restrictions Precautions Precautions: Fall      Mobility  Bed Mobility Overal bed mobility: Modified Independent                Transfers Overall transfer level: Modified independent                  Ambulation/Gait Ambulation/Gait assistance: Modified independent (Device/Increase time) Ambulation Distance (Feet): 300 Feet Assistive device: None Gait Pattern/deviations: Step-through pattern;Wide base of support   Gait velocity interpretation: at or above normal speed for age/gender    Stairs Stairs:  Yes Stairs assistance: Modified independent (Device/Increase time) Stair Management: One rail Left;Forwards Number of Stairs: 4    Wheelchair Mobility    Modified Rankin (Stroke Patients Only)       Balance Overall balance assessment: Needs assistance;History of Falls   Sitting balance-[name]: Good       Standing balance-[name]: Good   Single Leg Stance - Right Leg: 1 Single Leg Stance - Left Leg: 1 Tandem Stance - Right Leg: 20 Tandem Stance - Left Leg: 20   Rhomberg - Eyes Closed: 25     Standardized Balance Assessment Standardized Balance Assessment : Berg Balance Test Berg Balance Test Sit to Stand: Able to stand  independently using hands Standing Unsupported: Able to stand safely 2 minutes Sitting with Back Unsupported but Feet Supported on Floor or Stool: Able to sit safely and securely 2 minutes Stand to Sit: Sits safely with minimal use of hands Transfers: Able to transfer safely, minor use of hands Standing Unsupported with Eyes Closed: Able to stand 10 seconds safely Standing Ubsupported with Feet Together: Able to place feet together independently and stand 1 minute safely From Standing, Reach Forward with Outstretched Arm: Can reach forward >12 cm safely (5") From Standing Position, Pick up Object from Floor: Able to pick up shoe safely and easily From Standing Position, Turn to Look Behind Over each Shoulder: Looks behind one side only/other side shows less weight shift Turn 360 Degrees: Able to turn 360 degrees safely but slowly Standing  Unsupported, Alternately Place Feet on Step/Stool: Needs assistance to keep from falling or unable to try Standing Unsupported, One Foot in Front: Able to plae foot ahead of the other independently and hold 30 seconds Standing on One Leg: Tries to lift leg/unable to hold 3 seconds but remains standing independently Total Score: 43         Pertinent Vitals/Pain Pain Assessment: No/denies pain    Home  Living Family/patient expects to be discharged to:: Private residence Living Arrangements: Spouse/significant other Available Help at Discharge: Family;Available 24 hours/day Type of Home: House Home Access: Stairs to enter Entrance Stairs-Rails: Left Entrance Stairs-Number of Steps: 4 Home Layout: One level Home Equipment: Cane - single point      Prior Function Level of Independence: Independent               Hand Dominance        Extremity/Trunk Assessment   Upper Extremity Assessment: Overall WFL for tasks assessed           Lower Extremity Assessment: Overall WFL for tasks assessed      Cervical / Trunk Assessment: Kyphotic;Other exceptions  Communication   Communication: No difficulties  Cognition Arousal/Alertness: Awake/alert Behavior During Therapy: WFL for tasks assessed/performed Overall Cognitive Status: Within Functional Limits for tasks assessed                      General Comments      Exercises        Assessment/Plan    PT Assessment Patient needs continued PT services  PT Diagnosis Difficulty walking   PT Problem List Decreased mobility;Decreased balance  PT Treatment Interventions Gait training;Therapeutic activities;Neuromuscular re-education;Other (comment) (vestibular therapy and repositioning)   PT Goals (Current goals can be found in the Care Plan section) Acute Rehab PT Goals Patient Stated Goal: return to work (runs a [employer]) PT Goal Formulation: With patient Time For Goal Achievement: 02/28/14 Potential to Achieve Goals: Good    Frequency Min 3X/week   Barriers to discharge        Co-evaluation               End of Session Equipment Utilized During Treatment: Gait belt Activity Tolerance: Patient tolerated treatment well Patient left: in chair;with call bell/phone within reach;with family/visitor present Nurse Communication: Mobility status         Time: 1003-1030 PT Time Calculation  (min) (ACUTE ONLY): 27 min   Charges:   PT Evaluation $Initial PT Evaluation Tier I: 1 Procedure PT Treatments $Canalith Rep Proc: 8-22 mins   PT G Codes:        Melford Aase 02/21/2014, 10:38 AM Elwyn Reach, [place]

## 2014-02-21 NOTE — Progress Notes (Signed)
02/21/2014 1:49 PM Discharge AVS meds taken today and those due this evening reviewed.  Follow-up appointments and when to call md reviewed.  D/C IV and TELE.  Questions and concerns addressed.   D/C home per orders./c [place]

## 2014-02-22 ENCOUNTER — Telehealth: Payer: Self-pay | Admitting: Internal Medicine

## 2014-02-22 NOTE — Telephone Encounter (Signed)
New message     Talk to the nurse regarding upcoming appt

## 2014-02-22 NOTE — Telephone Encounter (Signed)
Spoke with patient and his wife. Patient fell on steps in a gymgoiong to watch his grandson's ball game. Fell down them, hitting his head. Had bleeding,  In hospital tue-thurs afternoon. Doing better. Wanted to know if should come for appointment with Dr. Harrington Challenger. He has been able to get out today without difficulty. Will be at appointment Monday.

## 2014-02-24 NOTE — Progress Notes (Signed)
HPI This is a 81 yo with history of CAD   Cardiac catheterization revealed diffuse mild to moderate disease with ectatic circumflex and RCA. Most notable lesions would be tandem lesions in the LAD but there was no significant culprit lesion. There was to spare readings from defect and thermal dilution cardiac output but could be considered mild to moderate reduced cardiac output/index a preserved EF roughly 50%. 2-D echo EF 45-50%. Patient also had bigeminy and 6 beats of what was thought to be artifact. Beta blocker recommended. There is no history of syncope he has 40-50% and EPS did not feel any further workup was needed at this time.  Since seen has done well from a cardiac standpoint  No CP  No signif SOB   Fell last week going up stairs.  Tripped  Had SAH  Observed overnight in hosp    Allergies  Allergen Reactions  . Bee Venom   . Penicillins Swelling    Throat Swelling    Current Outpatient Prescriptions  Medication Sig Dispense Refill  . acetaminophen (TYLENOL) 325 MG tablet Take 2 tablets (650 mg total) by mouth every 6 (six) hours as needed for mild pain.    Marland Kitchen albuterol (PROVENTIL HFA;VENTOLIN HFA) 108 (90 BASE) MCG/ACT inhaler Inhale 2 puffs into the lungs every 6 (six) hours as needed for wheezing.    Marland Kitchen atorvastatin (LIPITOR) 80 MG tablet Take 1 tablet (80 mg total) by mouth daily at 6 PM. 30 tablet 5  . Azelastine HCl (ASTEPRO) 0.15 % SOLN 2 sprays by Each Nare route daily as needed.    . fexofenadine (ALLEGRA) 60 MG tablet Take 60 mg by mouth 2 (two) times daily.    Marland Kitchen guaiFENesin (MUCINEX) 600 MG 12 hr tablet Take 1,200 mg by mouth 2 (two) times daily.    . metoprolol tartrate (LOPRESSOR) 25 MG tablet Take 0.5 tablets (12.5 mg total) by mouth 2 (two) times daily. 60 tablet 5  . NASONEX 50 MCG/ACT nasal spray Place 2 sprays into the nose daily.   4  . nitroGLYCERIN (NITROSTAT) 0.4 MG SL tablet Place 1 tablet (0.4 mg total) under the tongue every 5 (five) minutes x 3  doses as needed for chest pain. 25 tablet 12  . Olopatadine HCl 0.6 % SOLN Place 1 Bottle into both nostrils as needed. Sinus irrigation  4  . omeprazole (PRILOSEC) 40 MG capsule Take 40 mg by mouth every evening.    . traMADol (ULTRAM) 50 MG tablet Take 1 tablet (50 mg total) by mouth every 12 (twelve) hours as needed for moderate pain. 15 tablet 0  . venlafaxine XR (EFFEXOR-XR) 75 MG 24 hr capsule   10  . vitamin B-12 (CYANOCOBALAMIN) 100 MCG tablet Take 100 mcg by mouth daily.     No current facility-administered medications for this visit.    Past Medical History  Diagnosis Date  . GERD (gastroesophageal reflux disease)   . Degenerative arthritis   . Constipation   . Seasonal allergies     Past Surgical History  Procedure Laterality Date  . Hip surgery    . Back surgery    . Neck surgery    . Joint replacement      hip  . Eye surgery      right  . Lumbar laminectomy/decompression microdiscectomy Bilateral 12/04/2012    Procedure: BILATERAL LUMBAR LAMINECTOMY/DECOMPRESSION MICRODISCECTOMY LUMBAR FOUR-TO FIVE SACRALONE;  Surgeon: Elaina Hoops, MD;  Location: [place] [facility];  Service: Neurosurgery;  Laterality: Bilateral;  .  Cholecystectomy    . Left heart catheterization with coronary angiogram N/A 12/10/2013    Procedure: LEFT HEART CATHETERIZATION WITH CORONARY ANGIOGRAM;  Surgeon: Leonie Man, MD;  [facility]: [facility] C[facility] LAB;  Service: Cardiovascular;  Laterality: N/A;  . Cardiac catheterization      Family History  Problem Relation Age of Onset  . Heart attack Neg Hx   . Stroke Neg Hx   . Hypertension Neg Hx   . Cancer Mother   . Cancer Brother     History   Social History  . Marital Status: Married    Spouse Name: N/A    Number of Children: N/A  . Years of Education: N/A   Occupational History  . Not on file.   Social History Main Topics  . Smoking status: Former Smoker    Quit date: 02/16/1960  . Smokeless tobacco: Not on file  . Alcohol Use: No  . Drug  Use: No  . Sexual Activity: Not on file   Other Topics Concern  . Not on file   Social History Narrative    Review of Systems:  All systems reviewed.  They are negative to the above problem except as previously stated.  Vital Signs: BP 115/60 mmHg  Pulse 84  Ht 5\' 5"  (1.651 m)  Wt 244 lb (110.678 kg)  BMI 40.60 kg/m2  SpO2 94%  Physical Exam [place] a morbidly obese 81 yo in NAD   HEENT:  Normocephalic, atraumatic. EOMI, PERRLA.  Neck: JVP is normal.  No bruits.  Lungs: clear to auscultation. No rales no wheezes.  Heart: Regular rate and rhythm. Normal S1, S2. No S3.   No significant murmurs. PMI not displaced.  Abdomen:  Supple, nontender. Normal bowel sounds. No masses. No hepatomegaly.  Extremities:   Good distal pulses throughout. No lower extremity edema.  Musculoskeletal :moving all extremities.  Neuro:   alert and oriented x3.  CN II-XII grossly intact.   Assessment and Plan: 1  CAD  No critical dz  Asymptomatic  Keep on currnet regimen  2.  HL Last lipid panel LDL was 46 HDL was 38  Keep on current regimen  3.  Balance  WIll check on referral to vestib/balance rehab   F/U in fall

## 2014-02-25 ENCOUNTER — Ambulatory Visit (INDEPENDENT_AMBULATORY_CARE_PROVIDER_SITE_OTHER): Payer: Medicare Other | Admitting: Internal Medicine

## 2014-02-25 ENCOUNTER — Encounter: Payer: Self-pay | Admitting: Internal Medicine

## 2014-02-25 VITALS — BP 115/60 | HR 84 | Ht 65.0 in | Wt 244.0 lb

## 2014-02-25 DIAGNOSIS — I10 Essential (primary) hypertension: Secondary | ICD-10-CM

## 2014-02-25 DIAGNOSIS — E785 Hyperlipidemia, unspecified: Secondary | ICD-10-CM

## 2014-02-25 NOTE — Patient Instructions (Addendum)
  Your physician recommends that you continue on your current medications as directed. Please refer to the Current Medication list given to you today.  Your physician wants you to follow-up in: 7 months with Dr. Harrington Challenger.  You will receive a reminder letter in the mail two months in advance. If you don't receive a letter, please call our office to schedule the follow-up appointment.

## 2014-03-04 ENCOUNTER — Ambulatory Visit: Payer: Medicare Other | Attending: Internal Medicine | Admitting: Rehabilitative and Restorative Service Providers"

## 2014-03-04 ENCOUNTER — Encounter: Payer: Self-pay | Admitting: Rehabilitative and Restorative Service Providers"

## 2014-03-04 DIAGNOSIS — R269 Unspecified abnormalities of gait and mobility: Secondary | ICD-10-CM

## 2014-03-04 NOTE — Therapy (Signed)
[facility] [address] [place], [place], [address] Phone: [phone]   Fax:  [phone]  Physical Therapy Evaluation  Patient Details  Name: Larry Church MRN: [id number] Date of Birth: Sep 10, 1932 Referring Provider:  Delfina Redwood, MD  Encounter Date: 03/04/2014      PT End of Session - 03/04/14 0942    Visit Number 1  G code 1   Number of Visits 8   Date for PT Re-Evaluation 04/06/14   Authorization Type G code every 10th visit    PT Start Time 0849   PT Stop Time 0930   PT Time Calculation (min) 41 min   Activity Tolerance Patient tolerated treatment well   Behavior During Therapy [facility] for tasks assessed/performed      Past Medical History  Diagnosis Date  . GERD (gastroesophageal reflux disease)   . Degenerative arthritis   . Constipation   . Seasonal allergies     Past Surgical History  Procedure Laterality Date  . Hip surgery    . Back surgery    . Neck surgery    . Joint replacement      hip  . Eye surgery      right  . Lumbar laminectomy/decompression microdiscectomy Bilateral 12/04/2012    Procedure: BILATERAL LUMBAR LAMINECTOMY/DECOMPRESSION MICRODISCECTOMY LUMBAR FOUR-TO FIVE SACRALONE;  Surgeon: Elaina Hoops, MD;  Location: [place] [facility];  Service: Neurosurgery;  Laterality: Bilateral;  . Cholecystectomy    . Left heart catheterization with coronary angiogram N/A 12/10/2013    Procedure: LEFT HEART CATHETERIZATION WITH CORONARY ANGIOGRAM;  Surgeon: Leonie Man, MD;  [facility] LAB;  Service: Cardiovascular;  Laterality: N/A;  . Cardiac catheterization      There were no vitals taken for this visit.  Visit Diagnosis:  Abnormality of gait      Subjective Assessment - 03/04/14 0852    Symptoms The patient reports a recent fall while ascending steps.   He reports mininmal sensation of dizziness when bending forwards and general unsteadiness.  He has a prism in his R  lens because of double vision.      Patient Stated Goals Improve balance.   Currently in Pain? No/denies          [facility] PT Assessment - 03/04/14 0001    Assessment   Medical Diagnosis Subarachnoid Hemorrhage, abnormality of gait   Onset Date 02/19/14   Prior Therapy acute care   Precautions   Precautions Fall   Balance Screen   Has the patient fallen in the past 6 months Yes   How many times? 3   Has the patient had a decrease in activity level because of a fear of falling?  No   Is the patient reluctant to leave their home because of a fear of falling?  No   Home Environment   Living Enviornment Private residence   Living Arrangements Spouse/significant other   Type of [place] to enter   Entrance Stairs-Number of Steps --  5   Entrance Stairs-Rails Can reach both   [place] One level   Observation/Other Assessments   Focus on Therapeutic Outcomes (FOTO)  61%   Other Surveys  --  ABC scale=33.8%   AROM   Overall AROM  Within functional limits for tasks performed  Intermittent discomfort in R shoulder with ER   Strength   Overall Strength --  4/5 bilat shoulder flex/abd; hip flex 4/5 bil; ankle DF  4/5    Ambulation/Gait   Ambulation/Gait Yes   Ambulation/Gait Assistance 6: Modified independent (Device/Increase time)   Ambulation Distance (Feet) 150 Feet   Assistive device --  walking stick for balance   Gait Pattern --  antalgic L gait pattern (knee pain), valgus knee posture   Gait velocity 2.21 ft/sec indicating "limited community ambulator"   Stairs Yes   Stairs Assistance 6: Modified independent (Device/Increase time)   Stair Management Technique Step to pattern;Two rails   Number of Stairs --  4   Standardized Balance Assessment   Standardized Balance Assessment Berg Balance Test   Berg Balance Test   Sit to Stand Able to stand  independently using hands   Standing Unsupported Able to stand safely 2 minutes   Sitting with Back  Unsupported but Feet Supported on Floor or Stool Able to sit safely and securely 2 minutes   Stand to Sit Controls descent by using hands   Transfers Able to transfer safely, definite need of hands   Standing Unsupported with Eyes Closed Able to stand 10 seconds safely   Standing Ubsupported with Feet Together Able to place feet together independently and stand 1 minute safely   From Standing, Reach Forward with Outstretched Arm Can reach forward >12 cm safely (5")   From Standing Position, Pick up Object from Floor Able to pick up shoe, needs supervision   From Standing Position, Turn to Look Behind Over each Shoulder Looks behind from both sides and weight shifts well   Turn 360 Degrees Able to turn 360 degrees safely but slowly   Standing Unsupported, Alternately Place Feet on Step/Stool Able to complete 4 steps without aid or supervision   Standing Unsupported, One Foot in Front Able to plae foot ahead of the other independently and hold 30 seconds   Standing on One Leg Tries to lift leg/unable to hold 3 seconds but remains standing independently   Total Score 43/56            Vestibular Assessment - 03/04/14 0906    Type of Dizziness --  "want to blink my eyes"   Frequency of Dizziness --  daily   Duration of Dizziness --  seconds   Aggravating Factors Looking up to the ceiling;Forward bending   Relieving Factors Head stationary   Dix-Hallpike Dix-Hallpike Right;Dix-Hallpike Left   Sidelying Test Sidelying Right;Sidelying Left   Horizontal Canal Testing Horizontal Canal Right;Horizontal Canal Left   Dix-Hallpike Right Symptoms No nystagmus   Dix-Hallpike Left Symptoms No nystagmus   Sidelying Right Symptoms No nystagmus   Sidelying Left Symptoms No nystagmus   Horizontal Canal Right Symptoms Normal   Horizontal Canal Left Symptoms Normal             PT Education - 03/04/14 0942    Education provided Yes   Education Details HEP: ankle dorsiflexion/plantarflexion,  single limb stance    Person(s) Educated Patient   Methods Explanation;Demonstration;Handout   Comprehension Verbalized understanding;Returned demonstration           PT Long Term Goals - 03/04/14 1253    PT LONG TERM GOAL #1   Title The patient will be independent with HEP for LE strength, balance and general mobility.  Target date 04/06/2014   Time 4   Period Weeks   PT LONG TERM GOAL #2   Title The patient will improve Berg score from 43/56 up to 48/56 to demo decreased risk for falls.  Target date 04/06/2014   Time 4  Period Weeks   PT LONG TERM GOAL #3   Title The patient will improve gait speed from 2.21 ft/sec up to 2.6 ft/sec to demo transition to "full community ambulator" classification of gait.  Target date 04/06/2014.   Time 4   Period Weeks   PT LONG TERM GOAL #4   Title The patient will negotiate unlevel, outdoor surfaces x 300 ft with walking stick modified indep for return to work activities.  Target date 04/06/2014.   Time 4   Period Weeks   PT LONG TERM GOAL #5   Title The patient will improve Activities of Balance Confidence scale from 33.8% up to 48% to demo improved confidence with functional tasks.  Target date 04/06/2014.   Time 4   Period Weeks               Plan - 03-13-2014 9937    Clinical Impression Statement The patient is a 81 yo male s/p fall with subarachnoid hemorrhage on 02/19/2014.  He has h/o gait instability, decreased depth perception, and imbalance.  PT to address deficits in order to optimize current functional status.   Pt will benefit from skilled therapeutic intervention in order to improve on the following deficits Abnormal gait;Impaired flexibility;Decreased endurance;Decreased activity tolerance;Decreased balance;Decreased mobility;Decreased strength   Rehab Potential Good   PT Frequency 2x / week   PT Duration 4 weeks   PT Treatment/Interventions Patient/family education;Therapeutic activities;Therapeutic exercise;Balance  training;Gait training;Stair training;Neuromuscular re-education;Functional mobility training   PT Next Visit Plan check HEP: progress adding hamstring stretches, balance exercises and general mobility training.   Consulted and Agree with Plan of Care Patient          G-Codes - March 13, 2014 1259    Functional Assessment Tool Used Berg=43/56, Activities of Balance Confidence=33.8%, Gait speed=2.21 ft/sec   Functional Limitation Mobility: Walking and moving around   Mobility: Walking and Moving Around Current Status [phone]) At least 20 percent but less than 40 percent impaired, limited or restricted   Mobility: Walking and Moving Around Goal Status [phone]) At least 1 percent but less than 20 percent impaired, limited or restricted       Problem List Patient Active Problem List   Diagnosis Date Noted  . Traumatic subarachnoid hemorrhage 02/20/2014  . Fall 02/19/2014  . CAD (coronary artery disease) 12/25/2013  . Cardiomyopathy, ischemic 12/25/2013  . Essential hypertension 12/25/2013  . Obesity, Class III, BMI 40-49.9 (morbid obesity) 12/25/2013  . Dyslipidemia 12/11/2013  . Unstable angina 12/10/2013  . Fatigue 10/31/2013  . Screening for lipoid disorders 10/31/2013  . Obstructive apnea 10/31/2013  . Cervical pain 09/26/2013  . Beat, premature ventricular 09/13/2013  . Allergic rhinitis 09/10/2013  . Chronic LBP 09/10/2013  . 4Th nerve palsy 09/10/2013  . DDD (degenerative disc disease), lumbosacral 09/10/2013  . Acid reflux 09/10/2013  . History of colon polyps 09/10/2013  . Lumbar canal stenosis 09/10/2013  . Depression, major, recurrent 09/10/2013  . Arthritis, degenerative 09/10/2013  . Atrophic kidney 09/10/2013  . Compressed spine fracture 09/10/2013    Thank you for the referral of this patient.   [place], [place] 2014/03/13, 1:00 PM  [facility] [address] [place], [place], [address] Phone:  [phone]   Fax:  [phone]

## 2014-03-04 NOTE — Patient Instructions (Signed)
Single Leg Balance: Eyes Open   Stand on right leg with eyes open. Hold __10_ seconds. *USE FINGERTIPS ON COUNTERTOP FOR SUPPORT. _3__ reps _2__ times per day.  http://ggbe.exer.us/4   Copyright  VHI. All rights reserved.  Balancing Act   In a standing position, go up on toes and down. Then back on heels. For balance, use support or put arms out in front. Repeat __20__ times in each direction. Do __2__ sessions per day.  http://gt2.exer.us/787   Copyright  VHI. All rights reserved.

## 2014-03-13 ENCOUNTER — Ambulatory Visit: Payer: Medicare Other | Admitting: Rehabilitative and Restorative Service Providers"

## 2014-03-15 ENCOUNTER — Ambulatory Visit: Payer: Medicare Other | Admitting: Rehabilitative and Restorative Service Providers"

## 2014-03-15 DIAGNOSIS — R269 Unspecified abnormalities of gait and mobility: Secondary | ICD-10-CM | POA: Diagnosis not present

## 2014-03-15 NOTE — Therapy (Signed)
[facility] [address] [place], [place], [address] Phone: [phone]   Fax:  [phone]  Physical Therapy Treatment  Patient Details  Name: Larry Church MRN: [id number] Date of Birth: Jun 19, 1932 Referring Provider:  Lilian Coma., MD  Encounter Date: 03/15/2014      PT End of Session - 03/15/14 1206    Visit Number 2  G code 2   Number of Visits 8   Date for PT Re-Evaluation 04/06/14   Authorization Type G code every 10th visit    PT Start Time 0940   PT Stop Time 1020   PT Time Calculation (min) 40 min   Equipment Utilized During Treatment Gait belt   Activity Tolerance Patient tolerated treatment well   Behavior During Therapy [facility] for tasks assessed/performed      Past Medical History  Diagnosis Date  . GERD (gastroesophageal reflux disease)   . Degenerative arthritis   . Constipation   . Seasonal allergies     Past Surgical History  Procedure Laterality Date  . Hip surgery    . Back surgery    . Neck surgery    . Joint replacement      hip  . Eye surgery      right  . Lumbar laminectomy/decompression microdiscectomy Bilateral 12/04/2012    Procedure: BILATERAL LUMBAR LAMINECTOMY/DECOMPRESSION MICRODISCECTOMY LUMBAR FOUR-TO FIVE SACRALONE;  Surgeon: Elaina Hoops, MD;  Location: [facility];  Service: Neurosurgery;  Laterality: Bilateral;  . Cholecystectomy    . Left heart catheterization with coronary angiogram N/A 12/10/2013    Procedure: LEFT HEART CATHETERIZATION WITH CORONARY ANGIOGRAM;  Surgeon: Leonie Man, MD;  Location: [facility] CATH LAB;  Service: Cardiovascular;  Laterality: N/A;  . Cardiac catheterization      There were no vitals taken for this visit.  Visit Diagnosis:  No diagnosis found.      Subjective Assessment - 03/15/14 0940    Symptoms The patient arrived 8 minutes late reporting "I've been running all morning". He reports he stopped using his cane earlier this week  and is doing HEP.   Currently in Pain? No/denies     THERAPEUTIC EXERCISE: Hamstring stretch seated Piriformis stretching seated Sit<>stand x 10 reps with cues posture Supine lumbar rocking Supine hip adductor stretching unilateral and then bilateral  Gait: Treadmill x 3 minutes up to 1.0 mph Gait without assistive device x 345 ft with cues on longer stride length and posture x 3 reps Gait with emphasis on correct use of single point cane with demo on proper use, verbal cues on sequencing, and instruction on using in R hand to relieve L knee pain  SELF CARE/HOME MANAGEMENT: Discussed alternative ways to donn shoes as patient needs help from his wife.  He was able to reach foot using a 9" stool for support.        PT Education - 03/15/14 1205    Education provided Yes   Education Details HEP: postural towel stretch, hip adductor stretch, hamstring stretch.  Also instructed on proper use of assistive device.   Person(s) Educated Patient   Methods Explanation;Demonstration;Handout   Comprehension Returned demonstration;Verbalized understanding             PT Long Term Goals - 03/04/14 1253    PT LONG TERM GOAL #1   Title The patient will be independent with HEP for LE strength, balance and general mobility.  Target date 04/06/2014   Time 4   Period [place]  PT LONG TERM GOAL #2   Title The patient will improve Berg score from 43/56 up to 48/56 to demo decreased risk for falls.  Target date 04/06/2014   Time 4   Period Weeks   PT LONG TERM GOAL #3   Title The patient will improve gait speed from 2.21 ft/sec up to 2.6 ft/sec to demo transition to "full community ambulator" classification of gait.  Target date 04/06/2014.   Time 4   Period Weeks   PT LONG TERM GOAL #4   Title The patient will negotiate unlevel, outdoor surfaces x 300 ft with walking stick modified indep for return to work activities.  Target date 04/06/2014.   Time 4   Period Weeks   PT LONG TERM GOAL #5    Title The patient will improve Activities of Balance Confidence scale from 33.8% up to 48% to demo improved confidence with functional tasks.  Target date 04/06/2014.   Time 4   Period Weeks               Problem List Patient Active Problem List   Diagnosis Date Noted  . Traumatic subarachnoid hemorrhage 02/20/2014  . Fall 02/19/2014  . CAD (coronary artery disease) 12/25/2013  . Cardiomyopathy, ischemic 12/25/2013  . Essential hypertension 12/25/2013  . Obesity, Class III, BMI 40-49.9 (morbid obesity) 12/25/2013  . Dyslipidemia 12/11/2013  . Unstable angina 12/10/2013  . Fatigue 10/31/2013  . Screening for lipoid disorders 10/31/2013  . Obstructive apnea 10/31/2013  . Cervical pain 09/26/2013  . Beat, premature ventricular 09/13/2013  . Allergic rhinitis 09/10/2013  . Chronic LBP 09/10/2013  . 4Th nerve palsy 09/10/2013  . DDD (degenerative disc disease), lumbosacral 09/10/2013  . Acid reflux 09/10/2013  . History of colon polyps 09/10/2013  . Lumbar canal stenosis 09/10/2013  . Depression, major, recurrent 09/10/2013  . Arthritis, degenerative 09/10/2013  . Atrophic kidney 09/10/2013  . Compressed spine fracture 09/10/2013    Darcell Sabino 03/15/2014, 12:09 PM  [facility] [address] [place], [place], [address] Phone: [phone]   Fax:  [phone]

## 2014-03-15 NOTE — Patient Instructions (Signed)
Thoracic Self-Mobilization (Supine)   With rolled towel placed lengthwise at lower ribs level, lie back on towel with arms outstretched. Hold _2 minutes. Relax. Repeat _1___ times per set. Do __1__ sets per session. Do _1-2___ sessions per day.  http://orth.exer.us/1000   Copyright  VHI. All rights reserved.  Hamstring Stretch   Sitting with operated leg straight on bed, and foot of other leg on floor, lean forward toward toes of straight leg. Hold _20-30___ seconds. Repeat _2___ times. Do _1-2___ sessions per day.  http://gt2.exer.us/302   Copyright  VHI. All rights reserved.  Butterfly, Supine   Lie on back, feet together. Lower knees toward floor. Hold _20-30__ seconds. Repeat _3__ times per session. Do ___1-2 sessions per day.  Copyright  VHI. All rights reserved.  *Try using a stool to put shoes on*.

## 2014-03-18 ENCOUNTER — Ambulatory Visit: Payer: Medicare Other | Attending: Internal Medicine | Admitting: Rehabilitative and Restorative Service Providers"

## 2014-03-18 DIAGNOSIS — R269 Unspecified abnormalities of gait and mobility: Secondary | ICD-10-CM | POA: Insufficient documentation

## 2014-03-18 NOTE — Therapy (Signed)
[facility] [address] [place], [place], [address] Phone: [phone]   Fax:  [phone]  Physical Therapy Treatment  Patient Details  Name: Larry Church MRN: [id number] Date of Birth: [birth year] Referring Provider:  Lilian Coma., MD  Encounter Date: 03/18/2014      PT End of Session - 03/18/14 1013    Visit Number 3  G code 3   Number of Visits 8   Date for PT Re-Evaluation 04/06/14   Authorization Type G code every 10th visit    PT Start Time 1014   PT Stop Time 1058   PT Time Calculation (min) 44 min   Equipment Utilized During Treatment Gait belt   Activity Tolerance Patient tolerated treatment well   Behavior During Therapy [facility] for tasks assessed/performed      Past Medical History  Diagnosis Date  . GERD (gastroesophageal reflux disease)   . Degenerative arthritis   . Constipation   . Seasonal allergies     Past Surgical History  Procedure Laterality Date  . Hip surgery    . Back surgery    . Neck surgery    . Joint replacement      hip  . Eye surgery      right  . Lumbar laminectomy/decompression microdiscectomy Bilateral 12/04/2012    Procedure: BILATERAL LUMBAR LAMINECTOMY/DECOMPRESSION MICRODISCECTOMY LUMBAR FOUR-TO FIVE SACRALONE;  Surgeon: Elaina Hoops, MD;  Location: [place] [facility];  Service: Neurosurgery;  Laterality: Bilateral;  . Cholecystectomy    . Left heart catheterization with coronary angiogram N/A 12/10/2013    Procedure: LEFT HEART CATHETERIZATION WITH CORONARY ANGIOGRAM;  Surgeon: Leonie Man, MD;  Location: [facility] CATH LAB;  Service: Cardiovascular;  Laterality: N/A;  . Cardiac catheterization      There were no vitals taken for this visit.  Visit Diagnosis:  Abnormality of gait      Subjective Assessment - 03/18/14 1013    Symptoms The patient reports he was sore on Saturday after exercise on Friday.  He feels "over cautious" about falling and feels like he is  shuffling instead of walking.  Pt c/o short sensation of "dizziness" that occurs when putting shirts overhead and when in the shower doing turns.   Currently in Pain? No/denies     NEUROMUSCULAR RE-EDUCATION: Berg=49/56     [facility] PT Assessment - 03/18/14 1048    Berg Balance Test   Sit to Stand Able to stand without using hands and stabilize independently   Standing Unsupported Able to stand safely 2 minutes   Sitting with Back Unsupported but Feet Supported on Floor or Stool Able to sit safely and securely 2 minutes   Stand to Sit Sits safely with minimal use of hands   Transfers Able to transfer safely, minor use of hands   Standing Unsupported with Eyes Closed Able to stand 10 seconds safely   Standing Ubsupported with Feet Together Able to place feet together independently and stand 1 minute safely   From Standing, Reach Forward with Outstretched Arm Can reach confidently >25 cm (10")   From Standing Position, Pick up Object from Floor Able to pick up shoe safely and easily   From Standing Position, Turn to Look Behind Over each Shoulder Looks behind from both sides and weight shifts well   Turn 360 Degrees Able to turn 360 degrees safely one side only in 4 seconds or less   Standing Unsupported, Alternately Place Feet on Step/Stool Able to complete 4 steps  without aid or supervision   Standing Unsupported, One Foot in Front Able to plae foot ahead of the other independently and hold 30 seconds   Standing on One Leg Tries to lift leg/unable to hold 3 seconds but remains standing independently   Total Score 49     Gait: The patient performed outdoor ambulation with SPC in R hand and cues on correct technique x 1000 ft nonstop.  He requires supervision for safety at this time. 43M=2.46 ft/sec Gait with cues on longer stride length, arm swing, posture x 1200 ft nonstop with shortness of breath, and back pain of 3-5/10 noted.    THERAPEUTIC EXERCISE: Step ups to 2" step x 10 reps 4"  steps x 6 reps before pain stops patient with L leg 6" step unable Alternating LE foot taps to 6" steps Seated LAQs with 3 lb weight x 15 reps (pt reports fatigue with movement) x 2 sets Sit<>stand x 10 reps without UE support Hamstring stretching              PT Long Term Goals - 03/18/14 1054    PT LONG TERM GOAL #1   Title The patient will be independent with HEP for LE strength, balance and general mobility.  Target date 04/06/2014   Time 4   Period Weeks   PT LONG TERM GOAL #2   Title The patient will improve Berg score from 43/56 up to 48/56 to demo decreased risk for falls.  Target date 04/06/2014   Baseline Met on 03/18/2014 improving to 49/56.   Time 4   Period Weeks   Status Achieved   PT LONG TERM GOAL #3   Title The patient will improve gait speed from 2.21 ft/sec up to 2.6 ft/sec to demo transition to "full community ambulator" classification of gait.  Target date 04/06/2014.   Time 4   Period Weeks   PT LONG TERM GOAL #4   Title The patient will negotiate unlevel, outdoor surfaces x 300 ft with walking stick modified indep for return to work activities.  Target date 04/06/2014.   Time 4   Period Weeks   PT LONG TERM GOAL #5   Title The patient will improve Activities of Balance Confidence scale from 33.8% up to 48% to demo improved confidence with functional tasks.  Target date 04/06/2014.   Time 4   Period Weeks           Plan - 03/18/14 1057    Clinical Impression Statement The patient is progressing towards [place] meeting goal for Berg balance test.  He is making progress with gait speed, but no tyet at the LTG to transition to "full community ambulator" classification of gait.    PT Next Visit Plan check HEP: progress adding hamstring stretches, balance exercises and general mobility training.   Consulted and Agree with Plan of Care Patient        Problem List Patient Active Problem List   Diagnosis Date Noted  . Traumatic subarachnoid hemorrhage  02/20/2014  . Fall 02/19/2014  . CAD (coronary artery disease) 12/25/2013  . Cardiomyopathy, ischemic 12/25/2013  . Essential hypertension 12/25/2013  . Obesity, Class III, BMI 40-49.9 (morbid obesity) 12/25/2013  . Dyslipidemia 12/11/2013  . Unstable angina 12/10/2013  . Fatigue 10/31/2013  . Screening for lipoid disorders 10/31/2013  . Obstructive apnea 10/31/2013  . Cervical pain 09/26/2013  . Beat, premature ventricular 09/13/2013  . Allergic rhinitis 09/10/2013  . Chronic LBP 09/10/2013  . 4Th nerve palsy 09/10/2013  .  DDD (degenerative disc disease), lumbosacral 09/10/2013  . Acid reflux 09/10/2013  . History of colon polyps 09/10/2013  . Lumbar canal stenosis 09/10/2013  . Depression, major, recurrent 09/10/2013  . Arthritis, degenerative 09/10/2013  . Atrophic kidney 09/10/2013  . Compressed spine fracture 09/10/2013    ,, PT 03/18/2014, 1:38 PM  [facility] [address] [place], [place], [address] Phone: [phone]   Fax:  [phone]

## 2014-03-20 ENCOUNTER — Ambulatory Visit: Payer: Medicare Other | Admitting: Rehabilitative and Restorative Service Providers"

## 2014-03-20 DIAGNOSIS — R269 Unspecified abnormalities of gait and mobility: Secondary | ICD-10-CM

## 2014-03-20 NOTE — Therapy (Signed)
[facility] [address] [place], [place], [address] Phone: [phone]   Fax:  [phone]  Physical Therapy Treatment  Patient Details  Name: Larry Church MRN: [id number] Date of Birth: 1932-08-16 Referring Provider:  Lilian Coma., MD  Encounter Date: 03/20/2014      PT End of Session - 03/20/14 1404    Visit Number 4  G code 4   Number of Visits 8   Date for PT Re-Evaluation 04/06/14   Authorization Type G code every 10th visit    PT Start Time 1020   PT Stop Time 1103   PT Time Calculation (min) 43 min   Equipment Utilized During Treatment Gait belt   Activity Tolerance Patient tolerated treatment well   Behavior During Therapy [facility] for tasks assessed/performed      Past Medical History  Diagnosis Date  . GERD (gastroesophageal reflux disease)   . Degenerative arthritis   . Constipation   . Seasonal allergies     Past Surgical History  Procedure Laterality Date  . Hip surgery    . Back surgery    . Neck surgery    . Joint replacement      hip  . Eye surgery      right  . Lumbar laminectomy/decompression microdiscectomy Bilateral 12/04/2012    Procedure: BILATERAL LUMBAR LAMINECTOMY/DECOMPRESSION MICRODISCECTOMY LUMBAR FOUR-TO FIVE SACRALONE;  Surgeon: Elaina Hoops, MD;  Location: [facility]URO ORS;  Service: Neurosurgery;  Laterality: Bilateral;  . Cholecystectomy    . Left heart catheterization with coronary angiogram N/A 12/10/2013    Procedure: LEFT HEART CATHETERIZATION WITH CORONARY ANGIOGRAM;  Surgeon: Leonie Man, MD;  Location: [facility] CATH LAB;  Service: Cardiovascular;  Laterality: N/A;  . Cardiac catheterization      There were no vitals taken for this visit.  Visit Diagnosis:  Abnormality of gait      Subjective Assessment - 03/20/14 1019    Symptoms The patient brought his walking stick and single point cane today to learn how to safely use it. The patient reports curbs and unlevel  surfaces challenging due to decreased depth perception.   Currently in Pain? No/denies     Gait: The patient ambulated with a walking stick in the R hand working on moving with the L leg.  He maintains the walking stick abducted from walking path with decreased incidence of kicking it (as compared with the [facility]). The patient ambulated 345 ft x 3 reps with cues on longer stride length, emphasized bilateral heel strike Curbs and ramps with walking stick modified indep today  THERAPEUTIC EXERCISE: Step ups 6" step x 10 reps R side (laterally) Step ups 2" step x 10 reps L side (laterally) Heel raises x 20 reps Straight leg raises supine x 10 L and R SLR with ER L foot x 10, then R Hip abduction x 10 reps in sidelying R and L sides.  Provided list of HEP for patient to check off daily.        PT Long Term Goals - 03/18/14 1054    PT LONG TERM GOAL #1   Title The patient will be independent with HEP for LE strength, balance and general mobility.  Target date 04/06/2014   Time 4   Period Weeks   PT LONG TERM GOAL #2   Title The patient will improve Berg score from 43/56 up to 48/56 to demo decreased risk for falls.  Target date 04/06/2014   Baseline Met on  03/18/2014 improving to 49/56.   Time 4   Period Weeks   Status Achieved   PT LONG TERM GOAL #3   Title The patient will improve gait speed from 2.21 ft/sec up to 2.6 ft/sec to demo transition to "full community ambulator" classification of gait.  Target date 04/06/2014.   Time 4   Period Weeks   PT LONG TERM GOAL #4   Title The patient will negotiate unlevel, outdoor surfaces x 300 ft with walking stick modified indep for return to work activities.  Target date 04/06/2014.   Time 4   Period Weeks   PT LONG TERM GOAL #5   Title The patient will improve Activities of Balance Confidence scale from 33.8% up to 48% to demo improved confidence with functional tasks.  Target date 04/06/2014.   Time 4   Period Weeks                Plan - 03/20/14 1404    Clinical Impression Statement The patient demonstrating decreased incidence of kicking his walking stick vs. walking cane and appears safer with walking stick (especially for unlevel surface negotiation in work activities).  PT to continue progressing to LTGs.   PT Next Visit Plan Balance, LE strengthening (emphasizing knee control), general mobility training.   Consulted and Agree with Plan of Care Patient        Problem List Patient Active Problem List   Diagnosis Date Noted  . Traumatic subarachnoid hemorrhage 02/20/2014  . Fall 02/19/2014  . CAD (coronary artery disease) 12/25/2013  . Cardiomyopathy, ischemic 12/25/2013  . Essential hypertension 12/25/2013  . Obesity, Class III, BMI 40-49.9 (morbid obesity) 12/25/2013  . Dyslipidemia 12/11/2013  . Unstable angina 12/10/2013  . Fatigue 10/31/2013  . Screening for lipoid disorders 10/31/2013  . Obstructive apnea 10/31/2013  . Cervical pain 09/26/2013  . Beat, premature ventricular 09/13/2013  . Allergic rhinitis 09/10/2013  . Chronic LBP 09/10/2013  . 4Th nerve palsy 09/10/2013  . DDD (degenerative disc disease), lumbosacral 09/10/2013  . Acid reflux 09/10/2013  . History of colon polyps 09/10/2013  . Lumbar canal stenosis 09/10/2013  . Depression, major, recurrent 09/10/2013  . Arthritis, degenerative 09/10/2013  . Atrophic kidney 09/10/2013  . Compressed spine fracture 09/10/2013    ,, PT 03/20/2014, 2:07 PM  [facility] [address] [place], [place], [address] Phone: [phone]   Fax:  [phone]

## 2014-03-20 NOTE — Patient Instructions (Signed)
HOME EXERCISE CHECKLIST:  1) Standing heel and toe raises  2) Standing on one leg  3) Hamstring stretch (seated)  4) Towel roll stretch (lying down)  5) Bringing knees apart (lying down)

## 2014-03-25 ENCOUNTER — Ambulatory Visit: Payer: Medicare Other | Admitting: Rehabilitative and Restorative Service Providers"

## 2014-03-25 DIAGNOSIS — R269 Unspecified abnormalities of gait and mobility: Secondary | ICD-10-CM

## 2014-03-25 NOTE — Therapy (Signed)
[facility] [address] [place], [place], [address] Phone: [phone]   Fax:  [phone]  Physical Therapy Treatment  Patient Details  Name: Larry Church MRN: [id number] Date of Birth: 1932-09-30 Referring Provider:  Lilian Coma., MD  Encounter Date: 03/25/2014      PT End of Session - 03/25/14 1443    Visit Number 5  G code 5   Number of Visits 8   Date for PT Re-Evaluation 04/06/14   Authorization Type G code every 10th visit    PT Start Time 1017   PT Stop Time 1100   PT Time Calculation (min) 43 min   Activity Tolerance Patient tolerated treatment well   Behavior During Therapy [facility] for tasks assessed/performed      Past Medical History  Diagnosis Date  . GERD (gastroesophageal reflux disease)   . Degenerative arthritis   . Constipation   . Seasonal allergies     Past Surgical History  Procedure Laterality Date  . Hip surgery    . Back surgery    . Neck surgery    . Joint replacement      hip  . Eye surgery      right  . Lumbar laminectomy/decompression microdiscectomy Bilateral 12/04/2012    Procedure: BILATERAL LUMBAR LAMINECTOMY/DECOMPRESSION MICRODISCECTOMY LUMBAR FOUR-TO FIVE SACRALONE;  Surgeon: Elaina Hoops, MD;  Location: [facility] NEURO ORS;  Service: Neurosurgery;  Laterality: Bilateral;  . Cholecystectomy    . Left heart catheterization with coronary angiogram N/A 12/10/2013    Procedure: LEFT HEART CATHETERIZATION WITH CORONARY ANGIOGRAM;  Surgeon: Leonie Man, MD;  Location: [facility] CATH LAB;  Service: Cardiovascular;  Laterality: N/A;  . Cardiac catheterization      There were no vitals taken for this visit.  Visit Diagnosis:  Abnormality of gait      Subjective Assessment - 03/25/14 1016    Symptoms The patient reports that single leg stance is not getting much easier in HEP.  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   Currently in Pain? No/denies     THERAPEUTIC  EXERCISE: Long arc quads seated x 10 reps R and L sides Side-stepping x 10 steps R and L x 3 sets Ankle pumps with knee extension in seated position Hamstring stretches SLR x 10 with 2 lbs on R and 5x no weight on L and 5x with 2 lbs on L Sidelying hip abduction x 10 reps each Standing hip abduction x 10 reps with UE support Standing heel/toe raises x 10 reps with UE support Standing hip adductor stretch near countertop for support  NEUROMUSCULAR RE-EDUCATION: Marching x 20 ft x 3 reps Alternating LE foot taps to 6" and 12" steps without UE support with CGA for safety  Gait: Backwards walking x 20 ft x 3 reps with UE support as needed Treadmill with UE support x 4 minutes up to 1.3 mph Gait x 200 ft x 4 minutes with cues on bilateral heel strike and arm swing                            PT Long Term Goals - 03/18/14 1054    PT LONG TERM GOAL #1   Title The patient will be independent with HEP for LE strength, balance and general mobility.  Target date 04/06/2014   Time 4   Period Weeks   PT LONG TERM GOAL #2   Title The patient  will improve Berg score from 43/56 up to 48/56 to demo decreased risk for falls.  Target date 04/06/2014   Baseline Met on 03/18/2014 improving to 49/56.   Time 4   Period Weeks   Status Achieved   PT LONG TERM GOAL #3   Title The patient will improve gait speed from 2.21 ft/sec up to 2.6 ft/sec to demo transition to "full community ambulator" classification of gait.  Target date 04/06/2014.   Time 4   Period Weeks   PT LONG TERM GOAL #4   Title The patient will negotiate unlevel, outdoor surfaces x 300 ft with walking stick modified indep for return to work activities.  Target date 04/06/2014.   Time 4   Period Weeks   PT LONG TERM GOAL #5   Title The patient will improve Activities of Balance Confidence scale from 33.8% up to 48% to demo improved confidence with functional tasks.  Target date 04/06/2014.   Time 4   Period Weeks                Plan - 03/25/14 1444    Clinical Impression Statement The patient has hip weakness impacting forces on his knees during gait activities and also making single limb stance activities more difficult.  PT to progress activities focusing on hip strengthening with balance tasks.   PT Next Visit Plan LE strength (hip and knee), general balance/mobility, begin checking goals   Consulted and Agree with Plan of Care Patient        Problem List Patient Active Problem List   Diagnosis Date Noted  . Traumatic subarachnoid hemorrhage 02/20/2014  . Fall 02/19/2014  . CAD (coronary artery disease) 12/25/2013  . Cardiomyopathy, ischemic 12/25/2013  . Essential hypertension 12/25/2013  . Obesity, Class III, BMI 40-49.9 (morbid obesity) 12/25/2013  . Dyslipidemia 12/11/2013  . Unstable angina 12/10/2013  . Fatigue 10/31/2013  . Screening for lipoid disorders 10/31/2013  . Obstructive apnea 10/31/2013  . Cervical pain 09/26/2013  . Beat, premature ventricular 09/13/2013  . Allergic rhinitis 09/10/2013  . Chronic LBP 09/10/2013  . 4Th nerve palsy 09/10/2013  . DDD (degenerative disc disease), lumbosacral 09/10/2013  . Acid reflux 09/10/2013  . History of colon polyps 09/10/2013  . Lumbar canal stenosis 09/10/2013  . Depression, major, recurrent 09/10/2013  . Arthritis, degenerative 09/10/2013  . Atrophic kidney 09/10/2013  . Compressed spine fracture 09/10/2013    Kilyn Maragh, PT 03/25/2014, 2:46 PM  [facility] [address] [place], [place], [address] Phone: [phone]   Fax:  [phone]

## 2014-03-27 ENCOUNTER — Ambulatory Visit: Payer: Medicare Other | Admitting: Rehabilitative and Restorative Service Providers"

## 2014-03-27 DIAGNOSIS — R269 Unspecified abnormalities of gait and mobility: Secondary | ICD-10-CM | POA: Diagnosis not present

## 2014-03-27 NOTE — Therapy (Signed)
[facility] [address] [place], [place], [address] Phone: [phone]   Fax:  [phone]  Physical Therapy Treatment  Patient Details  Name: Larry Church MRN: [id number] Date of Birth: 1933/01/10 Referring Provider:  Lilian Coma., MD  Encounter Date: 03/27/2014      PT End of Session - 03/27/14 1410    Visit Number 6  G code 6   Number of Visits 8   Date for PT Re-Evaluation 04/06/14   Authorization Type G code every 10th visit    PT Start Time 1024   PT Stop Time 1105   PT Time Calculation (min) 41 min   Activity Tolerance Patient tolerated treatment well   Behavior During Therapy [facility] for tasks assessed/performed      Past Medical History  Diagnosis Date  . GERD (gastroesophageal reflux disease)   . Degenerative arthritis   . Constipation   . Seasonal allergies     Past Surgical History  Procedure Laterality Date  . Hip surgery    . Back surgery    . Neck surgery    . Joint replacement      hip  . Eye surgery      right  . Lumbar laminectomy/decompression microdiscectomy Bilateral 12/04/2012    Procedure: BILATERAL LUMBAR LAMINECTOMY/DECOMPRESSION MICRODISCECTOMY LUMBAR FOUR-TO FIVE SACRALONE;  Surgeon: Elaina Hoops, MD;  Location: [place] [facility];  Service: Neurosurgery;  Laterality: Bilateral;  . Cholecystectomy    . Left heart catheterization with coronary angiogram N/A 12/10/2013    Procedure: LEFT HEART CATHETERIZATION WITH CORONARY ANGIOGRAM;  Surgeon: Leonie Man, MD;  Location: [facility] CATH LAB;  Service: Cardiovascular;  Laterality: N/A;  . Cardiac catheterization      There were no vitals taken for this visit.  Visit Diagnosis:  Abnormality of gait      Subjective Assessment - 03/27/14 1025    Symptoms The patient reports no soreness after last session.  He is stiff this morning and has "tired, weak feeling" in the lateral aspect of his thighs, knees.   Currently in Pain?  No/denies     THERAPEUTIC EXERCISE: Step ups R side x 10 reps without UE support to 6" step Step ups to 2" step L side x 5 reps with discomfort and apprehension Hamstring strech seated Hip straight leg raise in extension/flexion/abduction planes with blue t-band and UE support as needed  Gait: Ambulation without device x 500 ft nonstop without loss of balance Treadmill x 4 minutes with UE support up to 1.4 mph  NEUROMUSCULAR RE-EDUCATION: Rocker board with wall bumps and min A Rocker board with head turns and CGA Rocker board eyes closed during wall bumps and min A           PT Long Term Goals - 03/18/14 1054    PT LONG TERM GOAL #1   Title The patient will be independent with HEP for LE strength, balance and general mobility.  Target date 04/06/2014   Time 4   Period Weeks   PT LONG TERM GOAL #2   Title The patient will improve Berg score from 43/56 up to 48/56 to demo decreased risk for falls.  Target date 04/06/2014   Baseline Met on 03/18/2014 improving to 49/56.   Time 4   Period Weeks   Status Achieved   PT LONG TERM GOAL #3   Title The patient will improve gait speed from 2.21 ft/sec up to 2.6 ft/sec to demo transition to "full community  ambulator" classification of gait.  Target date 04/06/2014.   Time 4   Period Weeks   PT LONG TERM GOAL #4   Title The patient will negotiate unlevel, outdoor surfaces x 300 ft with walking stick modified indep for return to work activities.  Target date 04/06/2014.   Time 4   Period Weeks   PT LONG TERM GOAL #5   Title The patient will improve Activities of Balance Confidence scale from 33.8% up to 48% to demo improved confidence with functional tasks.  Target date 04/06/2014.   Time 4   Period Weeks               Plan - 03/27/14 1103    PT Next Visit Plan check HEP, begin d/c planning (goal checking)   Consulted and Agree with Plan of Care Patient        Problem List Patient Active Problem List   Diagnosis Date  Noted  . Traumatic subarachnoid hemorrhage 02/20/2014  . Fall 02/19/2014  . CAD (coronary artery disease) 12/25/2013  . Cardiomyopathy, ischemic 12/25/2013  . Essential hypertension 12/25/2013  . Obesity, Class III, BMI 40-49.9 (morbid obesity) 12/25/2013  . Dyslipidemia 12/11/2013  . Unstable angina 12/10/2013  . Fatigue 10/31/2013  . Screening for lipoid disorders 10/31/2013  . Obstructive apnea 10/31/2013  . Cervical pain 09/26/2013  . Beat, premature ventricular 09/13/2013  . Allergic rhinitis 09/10/2013  . Chronic LBP 09/10/2013  . 4Th nerve palsy 09/10/2013  . DDD (degenerative disc disease), lumbosacral 09/10/2013  . Acid reflux 09/10/2013  . History of colon polyps 09/10/2013  . Lumbar canal stenosis 09/10/2013  . Depression, major, recurrent 09/10/2013  . Arthritis, degenerative 09/10/2013  . Atrophic kidney 09/10/2013  . Compressed spine fracture 09/10/2013    ,, PT 03/27/2014, 2:12 PM  [facility] [address] [place], [place], [address] Phone: [phone]   Fax:  [phone]

## 2014-04-01 ENCOUNTER — Ambulatory Visit: Payer: Medicare Other | Admitting: Rehabilitative and Restorative Service Providers"

## 2014-04-03 ENCOUNTER — Ambulatory Visit: Payer: Medicare Other | Admitting: Rehabilitative and Restorative Service Providers"

## 2014-04-03 DIAGNOSIS — R269 Unspecified abnormalities of gait and mobility: Secondary | ICD-10-CM | POA: Diagnosis not present

## 2014-04-03 NOTE — Therapy (Signed)
[facility] [address] [place], [place], [address] Phone: [phone]   Fax:  [phone]  Physical Therapy Treatment  Patient Details  Name: Larry Church MRN: [id number] Date of Birth: [birth year] Referring Provider:  Lilian Coma., MD  Encounter Date: 04/03/2014      PT End of Session - 04/03/14 1406    Visit Number 7  G code 7   Number of Visits 8   Date for PT Re-Evaluation 04/06/14   Authorization Type G code every 10th visit    PT Start Time 1020   PT Stop Time 1105   PT Time Calculation (min) 45 min   Activity Tolerance Patient tolerated treatment well   Behavior During Therapy [facility] for tasks assessed/performed      Past Medical History  Diagnosis Date  . GERD (gastroesophageal reflux disease)   . Degenerative arthritis   . Constipation   . Seasonal allergies     Past Surgical History  Procedure Laterality Date  . Hip surgery    . Back surgery    . Neck surgery    . Joint replacement      hip  . Eye surgery      right  . Lumbar laminectomy/decompression microdiscectomy Bilateral 12/04/2012    Procedure: BILATERAL LUMBAR LAMINECTOMY/DECOMPRESSION MICRODISCECTOMY LUMBAR FOUR-TO FIVE SACRALONE;  Surgeon: Elaina Hoops, MD;  Location: [place];  Service: Neurosurgery;  Laterality: Bilateral;  . Cholecystectomy    . Left heart catheterization with coronary angiogram N/A 12/10/2013    Procedure: LEFT HEART CATHETERIZATION WITH CORONARY ANGIOGRAM;  Surgeon: Leonie Man, MD;  Location: [facility] CATH LAB;  Service: Cardiovascular;  Laterality: N/A;  . Cardiac catheterization      There were no vitals taken for this visit.  Visit Diagnosis:  Abnormality of gait      Subjective Assessment - 04/03/14 1021    Symptoms The patient noticed knees and hips sore yesterday and limited his mobility.    He reports decreased confidence when negotiating unlevel surfaces citing fear of falling, depth  perception limiting mobility.   Currently in Pain? No/denies     Gait: Community gait with SPC on level/paved surfaces modified indep and on unlevel surfaces (pine straw) with supervision and SPC with education on best way to negotiate curbs. Incline negotiation with SPC modified indep on paved surfaces  Pt ambulates >300 ft outdoors nonstop with SPC.   Gait emphasizing longer stride and heel strike  68M=10.33 seconds=3.18 ft/sec without a device  NEUROMUSCULAR RE-EDUCATION: Sit<>bilateral sidelying with mild sensation of environmental movement with sit>L sidelying and return to sit.  He is negative for that sensation to the right.  He does have 5 beats nystagmus noted in room light.  PT had patient repeat sit>L sidelying for habituation x 3 repetitions with improvement in symptoms. Discussed negotiating unlevel surfaces and how to perform for safety.  Also recommended continued use of walking stick on unlevel surfaces. Gaze x 1 viewing standing at 3-4 feet distance with horizontal head movements.  CANOLITH REPOSITIONING TECHNIQUE: L epley's maneuver due to symptoms noted in L sidelying.         PT Education - 04/03/14 1405    Education provided Yes   Education Details HEP: Gaze x 1 viewing standing   Person(s) Educated Patient   Methods Explanation;Demonstration;Handout   Comprehension Returned demonstration;Verbalized understanding             PT Long Term Goals - 04/03/14 1406  PT LONG TERM GOAL #1   Title The patient will be independent with HEP for LE strength, balance and general mobility.  Target date 04/06/2014   Baseline met on 04/03/2014   Time 4   Period Weeks   Status Achieved   PT LONG TERM GOAL #2   Title The patient will improve Berg score from 43/56 up to 48/56 to demo decreased risk for falls.  Target date 04/06/2014   Baseline Met on 03/18/2014 improving to 49/56.   Time 4   Period Weeks   Status Achieved   PT LONG TERM GOAL #3   Title The patient  will improve gait speed from 2.21 ft/sec up to 2.6 ft/sec to demo transition to "full community ambulator" classification of gait.  Target date 04/06/2014.   Baseline Met on 04/03/14 improving from 2.21 ft/sec up to 3.18 ft/sec.   Time 4   Period Weeks   Status Achieved   PT LONG TERM GOAL #4   Title The patient will negotiate unlevel, outdoor surfaces x 300 ft with walking stick modified indep for return to work activities.  Target date 04/06/2014.   Time 4   Period Weeks   PT LONG TERM GOAL #5   Title The patient will improve Activities of Balance Confidence scale from 33.8% up to 48% to demo improved confidence with functional tasks.  Target date 04/06/2014.   Time 4   Period Weeks               Plan - 04/03/14 1408    Clinical Impression Statement The patient met LTG for gait speed and home program.  He continues with limitations on unlevel surfaces.  PT and patient discussed multi-factorial nature of imbalance (including knee pain, weakness, recent falls, imbalance).  The patient notes onset in the past week os greater sway and was found to have mild nystagmus noted with L sidelying test.  Treated today with canolith repositioning.   PT Next Visit Plan check HEP, begin d/c planning (goal checking)   Consulted and Agree with Plan of Care Patient        Problem List Patient Active Problem List   Diagnosis Date Noted  . Traumatic subarachnoid hemorrhage 02/20/2014  . Fall 02/19/2014  . CAD (coronary artery disease) 12/25/2013  . Cardiomyopathy, ischemic 12/25/2013  . Essential hypertension 12/25/2013  . Obesity, Class III, BMI 40-49.9 (morbid obesity) 12/25/2013  . Dyslipidemia 12/11/2013  . Unstable angina 12/10/2013  . Fatigue 10/31/2013  . Screening for lipoid disorders 10/31/2013  . Obstructive apnea 10/31/2013  . Cervical pain 09/26/2013  . Beat, premature ventricular 09/13/2013  . Allergic rhinitis 09/10/2013  . Chronic LBP 09/10/2013  . 4Th nerve palsy  09/10/2013  . DDD (degenerative disc disease), lumbosacral 09/10/2013  . Acid reflux 09/10/2013  . History of colon polyps 09/10/2013  . Lumbar canal stenosis 09/10/2013  . Depression, major, recurrent 09/10/2013  . Arthritis, degenerative 09/10/2013  . Atrophic kidney 09/10/2013  . Compressed spine fracture 09/10/2013    Kiamesha Samet, PT 04/03/2014, 2:10 PM  [facility] [address] [place], [place], [address] Phone: [phone]   Fax:  [phone]

## 2014-04-03 NOTE — Patient Instructions (Addendum)
Gaze Stabilization: Tip Card 1.Target must remain in focus, not blurry, and appear stationary while head is in motion. 2.Perform exercises with small head movements (45 to either side of midline). 3.Increase speed of head motion so long as target is in focus. 4.If you wear eyeglasses, be sure you can see target through lens (therapist will give specific instructions for bifocal / progressive lenses). 5.These exercises may provoke dizziness or nausea. Work through these symptoms. If too dizzy, slow head movement slightly. Rest between each exercise. 6.Exercises demand concentration; avoid distractions. 7.For safety, perform standing exercises close to a counter, wall, corner, or next to someone.  Copyright  VHI. All rights reserved.  Gaze Stabilization: Standing Feet Apart   Feet shoulder width apart, keeping eyes on target on wall __3__ feet away and move head side to side for _30___ seconds. Do _2___ sessions per day.   Copyright  VHI. All rights reserved.    HOME EXERCISE CHECKLIST:  1) Standing heel and toe raises  2) Standing on one leg  3) Hamstring stretch (seated)  4) Towel roll stretch (lying down)  5) Bringing knees apart (lying down)  6) Letter exercise with head turns

## 2014-04-11 ENCOUNTER — Encounter: Payer: Self-pay | Admitting: Rehabilitative and Restorative Service Providers"

## 2014-04-11 NOTE — Therapy (Signed)
[facility] [address] [place], [place], [address] Phone: [phone]   Fax:  [phone]  Patient Details  Name: Larry Church MRN: [id number] Date of Birth: 1932/07/04 Referring Provider:  No ref. provider found  Encounter Date: 04/11/2014 *phone call earlier this week to d/c  PHYSICAL THERAPY DISCHARGE SUMMARY  Visits from Start of Care: 7  Current functional level related to goals / functional outcomes:      PT Long Term Goals - 04/03/14 1406    PT LONG TERM GOAL #1   Title The patient will be independent with HEP for LE strength, balance and general mobility.  Target date 04/06/2014   Baseline met on 04/03/2014   Time 4   Period Weeks   Status Achieved   PT LONG TERM GOAL #2   Title The patient will improve Berg score from 43/56 up to 48/56 to demo decreased risk for falls.  Target date 04/06/2014   Baseline Met on 03/18/2014 improving to 49/56.   Time 4   Period Weeks   Status Achieved   PT LONG TERM GOAL #3   Title The patient will improve gait speed from 2.21 ft/sec up to 2.6 ft/sec to demo transition to "full community ambulator" classification of gait.  Target date 04/06/2014.   Baseline Met on 04/03/14 improving from 2.21 ft/sec up to 3.18 ft/sec.   Time 4   Period Weeks   Status Achieved   PT LONG TERM GOAL #4   Title The patient will negotiate unlevel, outdoor surfaces x 300 ft with walking stick modified indep for return to work activities.  Target date 04/06/2014.   Time 4   Period Weeks   PT LONG TERM GOAL #5   Title The patient will improve Activities of Balance Confidence scale from 33.8% up to 48% to demo improved confidence with functional tasks.  Target date 04/06/2014.   Time 4   Period Weeks    pt met 3/5 LTGs requiring supervision on unlevel surfaces for goal #4 and not re-taking ABC due to not returning for last session.     Remaining deficits: Decreased high level balance, knee pain  limiting functional mobility, fear of falling.   Education / Equipment: HEP, home safety.  Plan: Patient agrees to discharge.  Patient goals were partially met. Patient is being discharged due to meeting the stated rehab goals.  ?????   Patient called to cancel remaining PT visit.      Larry Church 04/11/2014, 1:38 PM  [facility] [address] [place], [place], [address] Phone: [phone]   Fax:  [phone]

## 2014-04-17 ENCOUNTER — Ambulatory Visit: Payer: Medicare Other | Admitting: Rehabilitative and Restorative Service Providers"

## 2014-05-01 ENCOUNTER — Encounter (HOSPITAL_COMMUNITY): Payer: Self-pay | Admitting: *Deleted

## 2014-05-01 ENCOUNTER — Emergency Department (HOSPITAL_COMMUNITY)
Admission: EM | Admit: 2014-05-01 | Discharge: 2014-05-01 | Disposition: A | Discharge status: Home / Self Care | Payer: Medicare Other | Attending: Emergency Medicine | Admitting: Emergency Medicine

## 2014-05-01 ENCOUNTER — Emergency Department (HOSPITAL_COMMUNITY): Payer: Medicare Other

## 2014-05-01 DIAGNOSIS — M1990 Unspecified osteoarthritis, unspecified site: Secondary | ICD-10-CM | POA: Diagnosis not present

## 2014-05-01 DIAGNOSIS — Z7982 Long term (current) use of aspirin: Secondary | ICD-10-CM | POA: Insufficient documentation

## 2014-05-01 DIAGNOSIS — Z87448 Personal history of other diseases of urinary system: Secondary | ICD-10-CM | POA: Insufficient documentation

## 2014-05-01 DIAGNOSIS — Z880 Allergy status to penicillin: Secondary | ICD-10-CM | POA: Insufficient documentation

## 2014-05-01 DIAGNOSIS — K219 Gastro-esophageal reflux disease without esophagitis: Secondary | ICD-10-CM | POA: Diagnosis not present

## 2014-05-01 DIAGNOSIS — R0789 Other chest pain: Secondary | ICD-10-CM | POA: Insufficient documentation

## 2014-05-01 DIAGNOSIS — R079 Chest pain, unspecified: Secondary | ICD-10-CM | POA: Diagnosis present

## 2014-05-01 DIAGNOSIS — J45909 Unspecified asthma, uncomplicated: Secondary | ICD-10-CM | POA: Insufficient documentation

## 2014-05-01 DIAGNOSIS — Z79899 Other long term (current) drug therapy: Secondary | ICD-10-CM | POA: Diagnosis not present

## 2014-05-01 DIAGNOSIS — Z87891 Personal history of nicotine dependence: Secondary | ICD-10-CM | POA: Insufficient documentation

## 2014-05-01 DIAGNOSIS — Z9889 Other specified postprocedural states: Secondary | ICD-10-CM | POA: Insufficient documentation

## 2014-05-01 DIAGNOSIS — R600 Localized edema: Secondary | ICD-10-CM | POA: Insufficient documentation

## 2014-05-01 HISTORY — DX: Unspecified asthma, uncomplicated: J45.909

## 2014-05-01 LAB — CBC WITH DIFFERENTIAL/PLATELET
Basophils Absolute: 0 10*3/uL (ref 0.0–0.1)
Basophils Relative: 0 % (ref 0–1)
Eosinophils Absolute: 0.5 10*3/uL (ref 0.0–0.7)
Eosinophils Relative: 5 % (ref 0–5)
HCT: 40.5 % (ref 39.0–52.0)
Hemoglobin: 13.7 g/dL (ref 13.0–17.0)
Lymphocytes Relative: 21 % (ref 12–46)
Lymphs Abs: 1.9 10*3/uL (ref 0.7–4.0)
MCH: 32.1 pg (ref 26.0–34.0)
MCHC: 33.8 g/dL (ref 30.0–36.0)
MCV: 94.8 fL (ref 78.0–100.0)
Monocytes Absolute: 0.9 10*3/uL (ref 0.1–1.0)
Monocytes Relative: 9 % (ref 3–12)
Neutro Abs: 6.1 10*3/uL (ref 1.7–7.7)
Neutrophils Relative %: 65 % (ref 43–77)
Platelets: 242 10*3/uL (ref 150–400)
RBC: 4.27 MIL/uL (ref 4.22–5.81)
RDW: 15.5 % (ref 11.5–15.5)
WBC: 9.3 10*3/uL (ref 4.0–10.5)

## 2014-05-01 LAB — I-STAT CHEM 8, ED
BUN: 25 mg/dL — ABNORMAL HIGH (ref 6–23)
Calcium, Ion: 1.27 mmol/L (ref 1.13–1.30)
Chloride: 102 mmol/L (ref 96–112)
Creatinine, Ser: 0.9 mg/dL (ref 0.50–1.35)
Glucose, Bld: 101 mg/dL — ABNORMAL HIGH (ref 70–99)
HCT: 41 % (ref 39.0–52.0)
Hemoglobin: 13.9 g/dL (ref 13.0–17.0)
Potassium: 4.2 mmol/L (ref 3.5–5.1)
Sodium: 137 mmol/L (ref 135–145)
TCO2: 23 mmol/L (ref 0–100)

## 2014-05-01 LAB — D-DIMER, QUANTITATIVE (NOT AT ARMC): D-Dimer, Quant: 1.07 ug/mL-FEU — ABNORMAL HIGH (ref 0.00–0.48)

## 2014-05-01 LAB — I-STAT TROPONIN, ED: Troponin i, poc: 0 ng/mL (ref 0.00–0.08)

## 2014-05-01 MED ORDER — IOHEXOL 350 MG/ML SOLN
80.0000 mL | Freq: Once | INTRAVENOUS | Status: AC | PRN
  Administered 2014-05-01: 80 mL via INTRAVENOUS

## 2014-05-01 NOTE — ED Provider Notes (Signed)
CSN: [id number]     Arrival date & time 05/01/14  1711 History   First MD Initiated Contact with Patient 05/01/14 1715     Chief Complaint  Patient presents with  . Chest Pain     (Consider location/radiation/quality/duration/timing/severity/associated sxs/prior Treatment) HPI   Larry Church is a 81 y.o. male who presents for evaluation of chest pain. The pain has been present since earlier today. The pain waxes and wanes, and at times goes away completely. The pain is better when he rubs the left side of his chest. There is nothing that seems to cause the pain. He does not have any associated nausea, vomiting, shortness of breath, diaphoresis or weakness. He feels generally tired, for the last several days. 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 He presents by EMS for evaluation. He was treated with aspirin and nitroglycerin by EMS. He has not had similar problems in the past. There are no other known modifying factors.   Past Medical History  Diagnosis Date  . GERD (gastroesophageal reflux disease)   . Degenerative arthritis   . Constipation   . Seasonal allergies   . Asthma   . Renal insufficiency    Past Surgical History  Procedure Laterality Date  . Hip surgery    . Back surgery    . Neck surgery    . Joint replacement      hip  . Eye surgery      right  . Lumbar laminectomy/decompression microdiscectomy Bilateral 12/04/2012    Procedure: BILATERAL LUMBAR LAMINECTOMY/DECOMPRESSION MICRODISCECTOMY LUMBAR FOUR-TO FIVE SACRALONE;  Surgeon: Elaina Hoops, MD;  Location: [place];  Service: Neurosurgery;  Laterality: Bilateral;  . Cholecystectomy    . Left heart catheterization with coronary angiogram N/A 12/10/2013    Procedure: LEFT HEART CATHETERIZATION WITH CORONARY ANGIOGRAM;  Surgeon: Leonie Man, MD;  Location: [facility] CATH LAB;  Service: Cardiovascular;  Laterality:  N/A;  . Cardiac catheterization     Family History  Problem Relation Age of Onset  . Heart attack Neg Hx   . Stroke Neg Hx   . Hypertension Neg Hx   . Cancer Mother   . Cancer Brother    History  Substance Use Topics  . Smoking status: Former Smoker    Quit date: 02/16/1960  . Smokeless tobacco: Not on file  . Alcohol Use: No    Review of Systems  All other systems reviewed and are negative.     Allergies  Penicillins and Bee venom  Home Medications   Prior to Admission medications   Medication Sig Start Date End Date Taking? Authorizing Provider  acetaminophen (TYLENOL) 325 MG tablet Take 2 tablets (650 mg total) by mouth every 6 (six) hours as needed for mild pain. 02/21/14  Yes Delfina Redwood, MD  albuterol (PROVENTIL HFA;VENTOLIN HFA) 108 (90 BASE) MCG/ACT inhaler Inhale 2 puffs into the lungs every 6 (six) hours as needed for wheezing.   Yes Historical Provider, MD  aspirin EC 81 MG tablet Take 81 mg by mouth daily.   Yes Historical Provider, MD  atorvastatin (LIPITOR) 80 MG tablet Take 1 tablet (80 mg total) by mouth daily at 6 PM. 12/11/13  Yes Brett Canales, PA-C  Azelastine HCl (ASTEPRO) 0.15 % SOLN 2 sprays by Each Nare route daily as needed.   Yes Historical Provider, MD  clindamycin (CLEOCIN)  150 MG capsule Take 600 mg by mouth once. For dental procedure 04/29/14  Yes Historical Provider, MD  esomeprazole (NEXIUM) 20 MG capsule Take 20 mg by mouth as needed (for heartburn).   Yes Historical Provider, MD  fexofenadine (ALLEGRA) 60 MG tablet Take 60 mg by mouth 2 (two) times daily.   Yes Historical Provider, MD  guaiFENesin (MUCINEX) 600 MG 12 hr tablet Take 1,200 mg by mouth 2 (two) times daily as needed for cough or to loosen phlegm.    Yes Historical Provider, MD  metoprolol tartrate (LOPRESSOR) 25 MG tablet Take 0.5 tablets (12.5 mg total) by mouth 2 (two) times daily. 12/11/13  Yes Einar Pheasant Hager, PA-C  NASONEX 50 MCG/ACT nasal spray Place 2 sprays into the  nose as needed (for congestion).  01/25/14  Yes Historical Provider, MD  nitroGLYCERIN (NITROSTAT) 0.4 MG SL tablet Place 1 tablet (0.4 mg total) under the tongue every 5 (five) minutes x 3 doses as needed for chest pain. 12/11/13  Yes Brett Canales, PA-C  omeprazole (PRILOSEC) 40 MG capsule Take 40 mg by mouth as needed (for heartburn).    Yes Historical Provider, MD  venlafaxine XR (EFFEXOR-XR) 150 MG 24 hr capsule Take 150 mg by mouth daily. 02/27/14  Yes Historical Provider, MD  VESICARE 5 MG tablet Take 5 mg by mouth daily. 04/30/14  Yes Historical Provider, MD  vitamin B-12 (CYANOCOBALAMIN) 100 MCG tablet Take 100 mcg by mouth daily.   Yes Historical Provider, MD  Olopatadine HCl 0.6 % SOLN Place 1 Bottle into both nostrils as needed. Sinus irrigation 01/25/14   Historical Provider, MD   BP 122/70 mmHg  Pulse 92  Temp(Src) 98.3 F (36.8 C) (Oral)  Resp 24  Ht 5' 5.5" (1.664 m)  Wt 225 lb (102.059 kg)  BMI 36.86 kg/m2  SpO2 92% Physical Exam  Constitutional: He is oriented to person, place, and time. He appears well-developed.  Elderly, robust  HENT:  Head: Normocephalic and atraumatic.  Right Ear: External ear normal.  Left Ear: External ear normal.  Eyes: Conjunctivae and EOM are normal. Pupils are equal, round, and reactive to light.  Neck: Normal range of motion and phonation normal. Neck supple.  Cardiovascular: Normal rate, regular rhythm and normal heart sounds.   Pulmonary/Chest: Effort normal and breath sounds normal. He exhibits no bony tenderness.  Mild left lateral chest wall tenderness, without crepitation.  Abdominal: Soft. He exhibits no distension. There is no tenderness.  Musculoskeletal: Normal range of motion. He exhibits edema (2+ pitting edema, bilaterally.). He exhibits no tenderness.  Neurological: He is alert and oriented to person, place, and time. No cranial nerve deficit or sensory deficit. He exhibits normal muscle tone. Coordination normal.  Skin: Skin  is warm, dry and intact.  Psychiatric: He has a normal mood and affect. His behavior is normal. Judgment and thought content normal.  Nursing note and vitals reviewed.   ED Course  Procedures (including critical care time)  Medications  iohexol (OMNIPAQUE) 350 MG/ML injection 80 mL (80 mLs Intravenous Contrast Given 05/01/14 2244)    Patient Vitals for the past 24 hrs:  BP Temp Temp src Pulse Resp SpO2 Height Weight  05/01/14 2331 122/70 mmHg - - 92 24 92 % - -  05/01/14 2327 114/67 mmHg - - 96 22 93 % - -  05/01/14 2315 114/67 mmHg - - 96 (!) 31 92 % - -  05/01/14 2303 - - - 98 - 92 % - -  05/01/14 2302  120/73 mmHg - - 97 - 93 % - -  05/01/14 2230 104/82 mmHg - - 106 (!) 28 93 % - -  05/01/14 2200 125/71 mmHg - - 99 21 94 % - -  05/01/14 2152 107/72 mmHg - - 100 18 94 % - -  05/01/14 2145 107/72 mmHg - - 97 (!) 27 94 % - -  05/01/14 2115 132/75 mmHg - - 92 20 96 % - -  05/01/14 2100 119/80 mmHg - - 89 (!) 27 92 % - -  05/01/14 2045 90/66 mmHg - - 93 20 91 % - -  05/01/14 2030 151/91 mmHg - - 90 14 91 % - -  05/01/14 2022 140/73 mmHg - - 88 22 94 % - -  05/01/14 2015 140/73 mmHg - - 82 20 90 % - -  05/01/14 2000 (!) 125/54 mmHg - - 87 17 93 % - -  05/01/14 1945 119/71 mmHg - - 87 25 93 % - -  05/01/14 1930 132/66 mmHg - - (!) 59 13 93 % - -  05/01/14 1900 136/77 mmHg - - (!) 135 21 93 % - -  05/01/14 1815 131/71 mmHg - - 78 18 90 % - -  05/01/14 1800 119/64 mmHg - - 78 21 95 % - -  05/01/14 1745 119/70 mmHg - - 76 23 94 % - -  05/01/14 1730 129/61 mmHg - - 83 16 92 % - -  05/01/14 1717 124/67 mmHg 98.3 F (36.8 C) Oral 83 20 92 % 5' 5.5" (1.664 m) 225 lb (102.059 kg)      At discharge -Reevaluation with update and discussion. After initial assessment and treatment, an updated evaluation reveals he is comfortable, has no additional complaints. Findings discussed with patient and family member, his wife states that he has been doing a lot of heavy work recently using a Copy. All questions answered.Daleen Bo L    Labs Review Labs Reviewed  D-DIMER, QUANTITATIVE - Abnormal; Notable for the following:    D-Dimer, Quant 1.07 (*)    All other components within normal limits  I-STAT CHEM 8, ED - Abnormal; Notable for the following:    BUN 25 (*)    Glucose, Bld 101 (*)    All other components within normal limits  CBC WITH DIFFERENTIAL/PLATELET  I-STAT TROPOININ, ED    Imaging Review Ct Angio Chest Pe W/cm &/or Wo Cm  05/01/2014   CLINICAL DATA:  81 year old male with a history of left-sided chest pain a weakness for 3 days.  EXAM: CT ANGIOGRAPHY CHEST WITH CONTRAST  TECHNIQUE: Multidetector CT imaging of the chest was performed using the standard protocol during bolus administration of intravenous contrast. Multiplanar CT image reconstructions and MIPs were obtained to evaluate the vascular anatomy.  CONTRAST:  68mL OMNIPAQUE IOHEXOL 350 MG/ML SOLN  COMPARISON:  CT abdomen 12/22/2012, CT chest 09/06/2009  FINDINGS: Chest:  Superficial soft tissues the chest wall unremarkable. No axillary adenopathy or supraclavicular adenopathy.  Unremarkable appearance of the thoracic inlet and. Unremarkable appearance of thyroid gland.  Central airways are patent without debris or central airway thickening.  Mediastinal lymph nodes are present, though none are enlarged by CT size criteria.  Heart size enlarged. Calcifications of left main, left anterior descending, circumflex, right coronary arteries. Dense mitral annular calcifications. Scattered aortic valve calcifications.  No pericardial fluid/ thickening.  Small hiatal hernia.  Calcifications of the aortic valve and branch vessels. Calcifications of the descending thoracic aorta.  No  aneurysm or dissection flap.  No periaortic fluid.  No central, lobar, segmental, or proximal subsegmental filling defects. Beyond this, motion artifact limits evaluation.  There is minimal subpleural reticulation throughout the lungs,  without a predominance of upper or lower lobes.  No bronchial wall thickening.  Mild cylindrical bronchiectasis.  No confluent airspace disease or pleural effusion.  Upper abdomen:  Unremarkable appearance of the visualized liver, spleen, bilateral adrenal glands, pancreas. Surgical changes of cholecystectomy.  Musculoskeletal: No acute bony abnormality. Multilevel degenerative disc disease of thoracic spine. Partially imaged surgical changes of cervical fusion.  Degenerative changes bilateral shoulders.  Review of the MIP images confirms the above findings.  IMPRESSION: Atherosclerosis with left main and 3 vessel coronary artery disease.  Mild subpleural reticulation, which can be seen in the setting of interstitial lung disease such as UIP/ IPF. No evidence of acute pneumonia.  Small hiatal hernia.  Signed,  Dulcy Fanny. Earleen Newport, DO  [facility]  [facility]   Electronically Signed   By: Corrie Mckusick D.O.   On: 05/01/2014 23:09   Dg Chest Port 1 View  05/01/2014   CLINICAL DATA:  Chest pain  EXAM: PORTABLE CHEST - 1 VIEW  COMPARISON:  February 19, 2014  FINDINGS: There is no edema or consolidation. There is fibrotic type change in each lung base, stable. Heart is mildly enlarged with pulmonary vascularity within normal limits. No adenopathy. There is postoperative change in the lower cervical spine.  IMPRESSION: Bibasilar lung fibrotic type change. No edema or consolidation. Stable cardiac enlargement. No appreciable change from prior study.   Electronically Signed   By: Lowella Grip III M.D.   On: 05/01/2014 20:29     EKG Interpretation   Date/Time:  Wednesday May 01 2014 17:18:44 EDT Ventricular Rate:  84 PR Interval:  246 QRS Duration: 102 QT Interval:  388 QTC Calculation: 459 R Axis:   -40 Text Interpretation:  Sinus rhythm Atrial premature complex Prolonged PR  interval Left axis deviation Low voltage, precordial leads Abnormal R-wave   progression, early transition since last tracing no significant change  Confirmed by Eulis Foster  MD, Noni Stonesifer ([id number]) on 05/01/2014 5:30:19 PM      MDM   Final diagnoses:  Chest wall pain    Evaluation is consistent with chest wall pain. Doubt ACS, PE or pneumonia.  Nursing Notes Reviewed/ Care Coordinated Applicable Imaging Reviewed Interpretation of Laboratory Data incorporated into ED treatment  The patient appears reasonably screened and/or stabilized for discharge and I doubt any other medical condition or other [facility] requiring further screening, evaluation, or treatment in the ED at this time prior to discharge.  Plan: Home Medications- Tylenol; Home Treatments- Heat; return here if the recommended treatment, does not improve the symptoms; Recommended follow up- PCP 1 week for check up    Daleen Bo, MD 05/02/14 [phone]

## 2014-05-01 NOTE — Discharge Instructions (Signed)

## 2014-05-01 NOTE — ED Notes (Signed)
MD at bedside. 

## 2014-05-01 NOTE — ED Notes (Addendum)
Per [facility] Pt has had a heartburn pain in his chest since Friday. The pain radiates into his neck then he describes it as a full feeling like he ate too many hotdogs. Pt has a hx of Jerrye Bushy and says that his gerd medicine does give him some relief.  Pt had 1 nitro in Route and 324mg  of aspirin and it did take his pain away.  VS are as follows BP: 117/74 HR:86 02sat 96% RA. EMS stated that pt was having a 1st degree heart block

## 2014-05-01 NOTE — ED Notes (Signed)
Pt made aware to return if symptoms worsen or if any life threatening symptoms occur.   

## 2014-06-03 ENCOUNTER — Other Ambulatory Visit: Payer: Self-pay | Admitting: Orthopaedic Surgery

## 2014-06-04 ENCOUNTER — Other Ambulatory Visit: Payer: Self-pay | Admitting: Orthopaedic Surgery

## 2014-06-20 ENCOUNTER — Other Ambulatory Visit (HOSPITAL_COMMUNITY): Payer: Medicare Other

## 2014-06-20 NOTE — Pre-Procedure Instructions (Addendum)
Larry Church  06/20/2014   Your procedure is scheduled on:  Tuesday, Jul 02, 2014  Report to [facility] Admitting at 8:30 AM.  Call this number if you have problems the morning of surgery: [phone]   Remember:   Do not eat food or drink liquids after midnight Monday, Jul 01, 2014   Take these medicines the morning of surgery with A SIP OF WATER:  metoprolol tartrate (LOPRESSOR), venlafaxine XR (EFFEXOR-XR),vesicars, If needed: acetaminophen (TYLENOL) for pain, esomeprazole (NEXIUM) OR omeprazole (PRILOSEC)   for heartburn, nitroGLYCERIN (NITROSTAT) for chest pain,,  albuterol (PROVENTIL HFA;VENTOLIN HFA)  Inhaler for wheezing ( Bring inhaler in with you on day of procedure ).  Stop taking Aspirin, otc vitamins and herbal medications such as Omega-3 Fatty Acids (FISH OIL) Do not take any NSAIDs ie: Ibuprofen, Advil, Naproxen or any medication containing Aspirin; stop 5 days prior to procedure (, 5. 12, 2016).   Do not wear jewelry, make-up or nail polish.  Do not wear lotions, powders, or perfumes. You may not wear deodorant.  Do not shave 48 hours prior to surgery. Men may shave face and neck.  Do not bring valuables to the hospital.  [facility] is not responsible for any belongings or valuables.               Contacts, dentures or bridgework may not be worn into surgery.  Leave suitcase in the car. After surgery it may be brought to your room.  For patients admitted to the hospital, discharge time is determined by your treatment team.               Patients discharged the day of surgery will not be allowed to drive home.  Name and phone number of your driver:   Special Instructions:  Special Instructions:Special Instructions: [facility] - Preparing for Surgery  Before surgery, you can play an important role.  Because skin is not sterile, your skin needs to be as free of germs as possible.  You can reduce the number of germs on you skin by washing with CHG  (chlorahexidine gluconate) soap before surgery.  CHG is an antiseptic cleaner which kills germs and bonds with the skin to continue killing germs even after washing.  Please DO NOT use if you have an allergy to CHG or antibacterial soaps.  If your skin becomes reddened/irritated stop using the CHG and inform your nurse when you arrive at Short Stay.  Do not shave (including legs and underarms) for at least 48 hours prior to the first CHG shower.  You may shave your face.  Please follow these instructions carefully:   1.  Shower with CHG Soap the night before surgery and the morning of Surgery.  2.  If you choose to wash your hair, wash your hair first as usual with your normal shampoo.  3.  After you shampoo, rinse your hair and body thoroughly to remove the Shampoo.  4.  Use CHG as you would any other liquid soap.  You can apply chg directly  to the skin and wash gently with scrungie or a clean washcloth.  5.  Apply the CHG Soap to your body ONLY FROM THE NECK DOWN.  Do not use on open wounds or open sores.  Avoid contact with your eyes, ears, mouth and genitals (private parts).  Wash genitals (private parts) with your normal soap.  6.  Wash thoroughly, paying special attention to the area where your surgery will be  performed.  7.  Thoroughly rinse your body with warm water from the neck down.  8.  DO NOT shower/wash with your normal soap after using and rinsing off the CHG Soap.  9.  Pat yourself dry with a clean towel.            10.  Wear clean pajamas.            11.  Place clean sheets on your bed the night of your first shower and do not sleep with pets.  Day of Surgery  Do not apply any lotions/deodorants the morning of surgery.  Please wear clean clothes to the hospital/surgery center.   Please read over the following fact sheets that you were given: Pain Booklet, Coughing and Deep Breathing, Blood Transfusion Information, Total Joint Packet, MRSA Information and Surgical Site  Infection Prevention

## 2014-06-21 ENCOUNTER — Encounter (HOSPITAL_COMMUNITY): Payer: Self-pay

## 2014-06-21 ENCOUNTER — Encounter (HOSPITAL_COMMUNITY)
Admission: RE | Admit: 2014-06-21 | Discharge: 2014-06-21 | Disposition: A | Discharge status: Home / Self Care | Payer: Medicare Other | Source: Ambulatory Visit | Attending: Orthopaedic Surgery | Admitting: Orthopaedic Surgery

## 2014-06-21 DIAGNOSIS — I10 Essential (primary) hypertension: Secondary | ICD-10-CM | POA: Diagnosis not present

## 2014-06-21 DIAGNOSIS — Z01812 Encounter for preprocedural laboratory examination: Secondary | ICD-10-CM | POA: Insufficient documentation

## 2014-06-21 LAB — URINALYSIS, ROUTINE W REFLEX MICROSCOPIC
Bilirubin Urine: NEGATIVE
Glucose, UA: NEGATIVE mg/dL
Hgb urine dipstick: NEGATIVE
Ketones, ur: NEGATIVE mg/dL
Leukocytes, UA: NEGATIVE
Nitrite: NEGATIVE
Protein, ur: NEGATIVE mg/dL
Specific Gravity, Urine: 1.015 (ref 1.005–1.030)
Urobilinogen, UA: 0.2 mg/dL (ref 0.0–1.0)
pH: 6 (ref 5.0–8.0)

## 2014-06-21 LAB — TYPE AND SCREEN
ABO/RH(D): O NEG
Antibody Screen: NEGATIVE

## 2014-06-21 LAB — CBC WITH DIFFERENTIAL/PLATELET
Basophils Absolute: 0 10*3/uL (ref 0.0–0.1)
Basophils Relative: 0 % (ref 0–1)
Eosinophils Absolute: 0.5 10*3/uL (ref 0.0–0.7)
Eosinophils Relative: 5 % (ref 0–5)
HCT: 45.2 % (ref 39.0–52.0)
Hemoglobin: 14.7 g/dL (ref 13.0–17.0)
Lymphocytes Relative: 16 % (ref 12–46)
Lymphs Abs: 1.5 10*3/uL (ref 0.7–4.0)
MCH: 31.7 pg (ref 26.0–34.0)
MCHC: 32.5 g/dL (ref 30.0–36.0)
MCV: 97.4 fL (ref 78.0–100.0)
Monocytes Absolute: 0.8 10*3/uL (ref 0.1–1.0)
Monocytes Relative: 8 % (ref 3–12)
Neutro Abs: 6.7 10*3/uL (ref 1.7–7.7)
Neutrophils Relative %: 71 % (ref 43–77)
Platelets: 261 10*3/uL (ref 150–400)
RBC: 4.64 MIL/uL (ref 4.22–5.81)
RDW: 15.4 % (ref 11.5–15.5)
WBC: 9.5 10*3/uL (ref 4.0–10.5)

## 2014-06-21 LAB — BASIC METABOLIC PANEL
Anion gap: 7 (ref 5–15)
BUN: 19 mg/dL (ref 6–20)
CO2: 27 mmol/L (ref 22–32)
Calcium: 9.5 mg/dL (ref 8.9–10.3)
Chloride: 105 mmol/L (ref 101–111)
Creatinine, Ser: 1.08 mg/dL (ref 0.61–1.24)
GFR calc Af Amer: >60 mL/min (ref >60)
GFR calc non Af Amer: >60 mL/min (ref >60)
Glucose, Bld: 96 mg/dL (ref 70–99)
Potassium: 3.9 mmol/L (ref 3.5–5.1)
Sodium: 139 mmol/L (ref 135–145)

## 2014-06-21 LAB — APTT: aPTT: 29 seconds (ref 24–37)

## 2014-06-21 LAB — PROTIME-INR
INR: 1.11 (ref 0.00–1.49)
Prothrombin Time: 14.4 seconds (ref 11.6–15.2)

## 2014-06-21 LAB — SURGICAL PCR SCREEN
MRSA, PCR: NEGATIVE
Staphylococcus aureus: NEGATIVE

## 2014-06-22 ENCOUNTER — Other Ambulatory Visit: Payer: Self-pay | Admitting: Physician Assistant

## 2014-06-22 LAB — ABO/RH: ABO/RH(D): O NEG

## 2014-06-24 ENCOUNTER — Encounter (HOSPITAL_COMMUNITY): Payer: Self-pay | Admitting: Certified Registered Nurse Anesthetist

## 2014-06-24 ENCOUNTER — Encounter (HOSPITAL_COMMUNITY): Payer: Self-pay | Admitting: Emergency Medicine

## 2014-06-24 NOTE — Progress Notes (Addendum)
Anesthesia Chart Review:  Pt is 81 year old male scheduled for L total knee arthroplasty on 07/02/2014 with Dr. Rhona Raider.   Cardiologist is Dr. Harrington Challenger, last office visit 02/24/2014. Follow up recommended for next fall.   PMH includes: HTN, OSA, renal insufficiency, asthma, GERD. Former smoker. BMI 40. S/p L4-5 bilateral lumbar laminectomy 12/04/12.    Pt fell in January 2016 and hit head. Has subdural hematoma. Seeing neurosurgery Lavell Anchors at [facility] in [place]), last office visit 06/11/2014. Note in Care everywhere discusses surgery for evacuation.   Pt seen in ER 05/01/2014 for chest pain. Troponin normal. D-dimer positive but CT angio of chest did not show PE. Determined to be chest wall pain.    Preoperative labs reviewed.    Chest x-ray 02/19/2014 reviewed. Chronic interstitial changes without acute abnormality.   EKG 05/01/2014: Sinus rhythm. Atrial premature complex. Prolonged PR interval. Left axis deviation. Low voltage, precordial leads. Abnormal R-wave progression, early transition  Echo 12/11/2013:  - HPI and indications: Rhythm is noted to be irregularly irregular. - Procedure narrative: Transthoracic echocardiography. Image quality was fair. The study was technically difficult, as a result of poor acoustic windows, poor sound wave transmission, and body habitus. Intravenous contrast (Definity) was administered. - Left ventricle: The cavity size was normal. Wall thickness was increased in a pattern of moderate LVH. Systolic function was mildly to moderately reduced. The estimated ejection fraction was in the range of 40% to 50%, with significant ectopy and beat to beat variation. Definity contrast was administered. No apical thrombus was visualized. Incoordinate septal motion, possible due to LBBB. The study is not technically sufficient to allow evaluation of LV diastolic function. - Aortic valve: Sclerosis without stenosis. There was no regurgitation. - Mitral  valve: Calcified annulus. There was trivial regurgitation. - Left atrium: The atrium was mildly dilated (36 ml/m2). Impressions: LVEF 40-50%, frequent ectopy, moderate LVH, incoordinate septal motion, aortic valve sclerosis, mild LAE, unable to estimate diastolic function.  Cardiac cath 12/10/2013: Coronary Anatomy: Codominant Left Main: Very large caliber vessel that trifurcates into the LAD, Ramus Intermedius, and Circumflex LAD: Normal caliber vessel with diffuse proximal calcification and roughly 20-30% stenosis. There are several small diagonal branches. At the takeoff of a small D1 there is roughly 40% stenosis. Beyond that in the distal vessel there is a roughly 50% focal lesion. No lesions. Flow-limiting. Left Circumflex: Very large caliber, ectatic vessel with a ostial may be 40% stenosis followed by large ectatic segment the goes into the AV groove. There is a AV groove circumflex that courses into the left posterior lateral system and a large lateral OM branch that bifurcates distally. In the proximal eccentric segment there is the appearance of turbulent swirling flow likely due to ectasia/near aneurysmal dilation.. There is no evidence of stenosis. OM1: Large-caliber lateral branch with several small branches. Bifurcates distally into 2 moderate caliber branches. The proximal portion is probably still ectatic. The AV groove circumflex continues distally and bifurcates into 2 small moderate caliber posterior lateral branches. Minimal luminal irregularities. Ramus intermedius: Large-caliber vessel that has an ostial 20% stenosis but is otherwise free of significant disease. It reaches almost all the way down to the apex, tapering distally. Minimal luminal irregularities beyond the ostial lesion. RCA: Normal caliber, codominant vessel that is somewhat ectatic in the mid segment with diffuse mild luminal irregularities of 10-20%. The vessel terminates as the RPDA which reaches about two thirds  with the apex.   Discussed case with Dr. Conrad Fairwood. Pt will need  clearance from neurosurgery prior to procedure. Notified Juliann Pulse in Dr. Jerald Kief office.   Willeen Cass, FNP-BC [facility] Short Stay Surgical Center/Anesthesiology Phone: [phone] 06/24/2014 4:20 PM  Addendum:  Pt saw neurosurgeon 07/01/14 for clearance prior to surgery. See Dr. Oralia Rud note in care everywhere.  Pt should not have spinal anesthesia due to subdural hematoma. Dr. Katherine Roan felt general anesthesia would be acceptable.   Willeen Cass, FNP-BC [facility] Short Stay Surgical Center/Anesthesiology Phone: [phone] 07/01/2014 4:14 PM

## 2014-07-01 MED ORDER — VANCOMYCIN HCL 10 G IV SOLR
1500.0000 mg | INTRAVENOUS | Status: DC
  Filled 2014-07-01: qty 1500

## 2014-07-01 NOTE — H&P (Signed)
TOTAL KNEE ADMISSION H&P  Patient is being admitted for left total knee arthroplasty.  Subjective:  Chief Complaint:left knee pain.  HPI: Larry Church, 81 y.o. male, has a history of pain and functional disability in the left knee due to arthritis and has failed non-surgical conservative treatments for greater than 12 weeks to includeNSAID's and/or analgesics, corticosteriod injections, flexibility and strengthening excercises, use of assistive devices, weight reduction as appropriate and activity modification.  Onset of symptoms was gradual, starting 5 years ago with gradually worsening course since that time. The patient noted no past surgery on the left knee(s).  Patient currently rates pain in the left knee(s) at 10 out of 10 with activity. Patient has night pain, worsening of pain with activity and weight bearing, pain that interferes with activities of daily living, crepitus and joint swelling.  Patient has evidence of subchondral cysts, subchondral sclerosis, periarticular osteophytes and joint space narrowing by imaging studies. There is no active infection.  Patient Active Problem List   Diagnosis Date Noted  . Traumatic subarachnoid hemorrhage 02/20/2014  . Fall 02/19/2014  . CAD (coronary artery disease) 12/25/2013  . Cardiomyopathy, ischemic 12/25/2013  . Essential hypertension 12/25/2013  . Obesity, Class III, BMI 40-49.9 (morbid obesity) 12/25/2013  . Dyslipidemia 12/11/2013  . Unstable angina 12/10/2013  . Fatigue 10/31/2013  . Screening for lipoid disorders 10/31/2013  . Obstructive apnea 10/31/2013  . Cervical pain 09/26/2013  . Beat, premature ventricular 09/13/2013  . Allergic rhinitis 09/10/2013  . Chronic LBP 09/10/2013  . 4Th nerve palsy 09/10/2013  . DDD (degenerative disc disease), lumbosacral 09/10/2013  . Acid reflux 09/10/2013  . History of colon polyps 09/10/2013  . Lumbar canal stenosis 09/10/2013  . Depression, major, recurrent 09/10/2013  . Arthritis,  degenerative 09/10/2013  . Atrophic kidney 09/10/2013  . Compressed spine fracture 09/10/2013   Past Medical History  Diagnosis Date  . GERD (gastroesophageal reflux disease)   . Degenerative arthritis   . Constipation   . Seasonal allergies   . Asthma   . Renal insufficiency   . Anginal pain     3/16  . Hypertension   . Sleep apnea     cpap used 5-7 yrs    Past Surgical History  Procedure Laterality Date  . Hip surgery Bilateral     x3 89.95.02    rt done twice       . Neck surgery  89.98    x2  . Lumbar laminectomy/decompression microdiscectomy Bilateral 12/04/2012    Procedure: BILATERAL LUMBAR LAMINECTOMY/DECOMPRESSION MICRODISCECTOMY LUMBAR FOUR-TO FIVE SACRALONE;  Surgeon: Elaina Hoops, MD;  Location: [facility];  Service: Neurosurgery;  Laterality: Bilateral;  . Cholecystectomy    . Left heart catheterization with coronary angiogram N/A 12/10/2013    Procedure: LEFT HEART CATHETERIZATION WITH CORONARY ANGIOGRAM;  Surgeon: Leonie Man, MD;  Location: [facility] CATH [facility];  Service: Cardiovascular;  Laterality: N/A;  . Cardiac catheterization    . Joint replacement Bilateral 89.95.02    hip rt x2 lft 1  . Back surgery      x2  . Eye surgery Right     right muscle  . Appendectomy  67  . Shoulders Bilateral     bone spurs    No prescriptions prior to admission   Allergies  Allergen Reactions  . Penicillins Swelling    Throat Swelling  . Bee Venom Other (See Comments)    unknown    History  Substance Use Topics  . Smoking status: Former  Smoker -- 1.00 packs/day    Types: Cigarettes    Quit date: 02/16/1960  . Smokeless tobacco: Not on file  . Alcohol Use: No    Family History  Problem Relation Age of Onset  . Heart attack Neg Hx   . Stroke Neg Hx   . Hypertension Neg Hx   . Cancer Mother   . Cancer Brother      Review of Systems  Musculoskeletal: Positive for joint pain.       Left knee  All other systems reviewed and are  negative.   Objective:  Physical Exam  Constitutional: He is oriented to person, place, and time. He appears well-developed and well-nourished.  HENT:  Head: Normocephalic and atraumatic.  Eyes: Conjunctivae are normal. Pupils are equal, round, and reactive to light.  Neck: Normal range of motion.  Cardiovascular: Normal rate and regular rhythm.   Respiratory: Effort normal.  GI: Soft.  Musculoskeletal:  Left knee has moderate valgus deformity.  His motion is from about 0-110.  There is no effusion.  He has crepitation and lateral joint line pain.  Hip motion is full and straight leg raise is negative.  Opposite knee has a lesser valgus deformity with about the same motion.  Sensation and motor function are intact in his feet with palpable pulses on both sides.   Neurological: He is alert and oriented to person, place, and time.  Skin: Skin is warm and dry.  Psychiatric: He has a normal mood and affect. His behavior is normal. Judgment and thought content normal.    Vital signs in last 24 hours:    Labs:   Estimated body mass index is 36.86 kg/(m^2) as calculated from the following:   Height as of 05/01/14: 5' 5.5" (1.664 m).   Weight as of 05/01/14: 102.059 kg (225 lb).   Imaging Review Plain radiographs demonstrate severe degenerative joint disease of the left knee(s). The overall alignment isneutral. The bone quality appears to be good for age and reported activity level.  Assessment/Plan:  End stage primary arthritis, left knee   The patient history, physical examination, clinical judgment of the provider and imaging studies are consistent with end stage degenerative joint disease of the left knee(s) and total knee arthroplasty is deemed medically necessary. The treatment options including medical management, injection therapy arthroscopy and arthroplasty were discussed at length. The risks and benefits of total knee arthroplasty were presented and reviewed. The risks due to  aseptic loosening, infection, stiffness, patella tracking problems, thromboembolic complications and other imponderables were discussed. The patient acknowledged the explanation, agreed to proceed with the plan and consent was signed. Patient is being admitted for inpatient treatment for surgery, pain control, PT, OT, prophylactic antibiotics, VTE prophylaxis, progressive ambulation and ADL's and discharge planning. The patient is planning to be discharged home with home health services

## 2014-07-01 NOTE — Progress Notes (Signed)
Patient reports that surgery was cancelled. Left message with Juliann Pulse at Dr. Jerald Kief office regarding same

## 2014-07-01 NOTE — Anesthesia Preprocedure Evaluation (Deleted)
Anesthesia Evaluation    Airway        Dental   Pulmonary former smoker,          Cardiovascular hypertension,     Neuro/Psych    GI/Hepatic   Endo/Other    Renal/GU      Musculoskeletal   Abdominal   Peds  Hematology   Anesthesia Other Findings   Reproductive/Obstetrics                             Anesthesia Physical Anesthesia Plan  ASA:   Anesthesia Plan:    Post-op Pain Management:    Induction:   Airway Management Planned:   Additional Equipment:   Intra-op Plan:   Post-operative Plan:   Informed Consent:   Plan Discussed with:   Anesthesia Plan Comments: (Per Dr. Katherine Roan, neurosurgeon monitoring pt's subdural hematoma, pt should not have spinal surgery for knee replacement; general anesthesia is acceptable. For further details, see note in care everywhere. )        Anesthesia Quick Evaluation

## 2014-07-02 ENCOUNTER — Inpatient Hospital Stay (HOSPITAL_COMMUNITY): Admission: RE | Admit: 2014-07-02 | Payer: Medicare Other | Source: Ambulatory Visit | Admitting: Orthopaedic Surgery

## 2014-07-02 ENCOUNTER — Encounter (HOSPITAL_COMMUNITY): Payer: Self-pay | Admitting: Certified Registered Nurse Anesthetist

## 2014-07-02 ENCOUNTER — Encounter (HOSPITAL_COMMUNITY): Admission: RE | Payer: Self-pay | Source: Ambulatory Visit

## 2014-07-02 SURGERY — ARTHROPLASTY, KNEE, TOTAL
Anesthesia: Spinal | Laterality: Left

## 2014-07-02 MED ORDER — PROPOFOL 10 MG/ML IV BOLUS
INTRAVENOUS | Status: AC
  Filled 2014-07-02: qty 20

## 2014-07-02 MED ORDER — FENTANYL CITRATE (PF) 250 MCG/5ML IJ SOLN
INTRAMUSCULAR | Status: AC
  Filled 2014-07-02: qty 5

## 2014-07-02 MED ORDER — STERILE WATER FOR INJECTION IJ SOLN
INTRAMUSCULAR | Status: AC
  Filled 2014-07-02: qty 10

## 2014-07-02 MED ORDER — ROCURONIUM BROMIDE 50 MG/5ML IV SOLN
INTRAVENOUS | Status: AC
  Filled 2014-07-02: qty 1

## 2014-07-02 MED ORDER — MIDAZOLAM HCL 2 MG/2ML IJ SOLN
INTRAMUSCULAR | Status: AC
  Filled 2014-07-02: qty 2

## 2014-07-02 MED ORDER — SUCCINYLCHOLINE CHLORIDE 20 MG/ML IJ SOLN
INTRAMUSCULAR | Status: AC
  Filled 2014-07-02: qty 1

## 2014-07-02 MED ORDER — EPHEDRINE SULFATE 50 MG/ML IJ SOLN
INTRAMUSCULAR | Status: AC
  Filled 2014-07-02: qty 1

## 2014-07-02 NOTE — Interval H&P Note (Signed)
OK for surgery PD 

## 2014-07-11 ENCOUNTER — Other Ambulatory Visit: Payer: Self-pay | Admitting: Orthopaedic Surgery

## 2014-08-23 ENCOUNTER — Other Ambulatory Visit (HOSPITAL_COMMUNITY): Payer: Medicare Other

## 2014-09-03 ENCOUNTER — Inpatient Hospital Stay (HOSPITAL_COMMUNITY): Admission: RE | Admit: 2014-09-03 | Payer: Medicare Other | Source: Ambulatory Visit | Admitting: Orthopaedic Surgery

## 2014-09-03 ENCOUNTER — Encounter (HOSPITAL_COMMUNITY): Admission: RE | Payer: Self-pay | Source: Ambulatory Visit

## 2014-09-03 SURGERY — ARTHROPLASTY, KNEE, TOTAL
Anesthesia: General | Laterality: Left

## 2014-09-04 ENCOUNTER — Other Ambulatory Visit: Payer: Self-pay | Admitting: Orthopaedic Surgery

## 2014-09-12 MED ORDER — CHLORHEXIDINE GLUCONATE 4 % EX LIQD: 60.0000 mL | Freq: Once | CUTANEOUS | Status: DC

## 2014-09-12 MED ORDER — VANCOMYCIN HCL 10 G IV SOLR: 1500.0000 mg | INTRAVENOUS | Status: DC

## 2014-09-12 MED ORDER — LACTATED RINGERS IV SOLN: INTRAVENOUS | Status: DC

## 2014-09-12 NOTE — Pre-Procedure Instructions (Signed)
    CARLO GUEVARRA  09/12/2014      Your procedure is scheduled on Tuesday, August 9.  Report to [facility] Admitting at 8:10 A.M.                 Your surgery is scheduled for 10:10 AM   Call this number if you have problems the morning of [phone]                For any other questions, please call [phone], Monday - Friday 8 AM - 4 PM.   Remember:  Do not eat food or drink liquids after midnight: August 8.  Take these medicines the morning of surgery with A SIP OF WATER : fexofenadine (ALLEGRA), metoprolol tartrate (LOPRESSOR), venlafaxine XR (EFFEXOR-XR).               May use Nasal Sprays and Inhalers, Please bring Albuterol inhaler to the hospital with you.                 Take if needed:acetaminophen (TYLENOL),  esomeprazole (NEXIUM), nitroGLYCERIN (NITROSTAT), omeprazole (PRILOSEC).                On August 23 stop taking Aspirin and Vitamins, Herbal Medications and NSAIDs  (Ibuprofen, Advil, Aleve, Naproxen).   Do not wear jewelry, make-up or nail polish.  Do not wear lotions, powders, or perfumes.  Y   Men may shave face and neck.  Do not bring valuables to the hospital.  [facility] is not responsible for any belongings or valuables.  Contacts, dentures or bridgework may not be worn into surgery.  Leave your suitcase in the car.  After surgery it may be brought to your room.  For patients admitted to the hospital, discharge time will be determined by your treatment team.  Special instructions:  Review  [facility] - Preparing For Surgery.  Please read over the following fact sheets that you were given. Pain Booklet, Coughing and Deep Breathing and Surgical Site Infection Prevention and Incentive Spirometery

## 2014-09-13 ENCOUNTER — Encounter (HOSPITAL_COMMUNITY): Payer: Self-pay

## 2014-09-13 ENCOUNTER — Encounter (HOSPITAL_COMMUNITY)
Admission: RE | Admit: 2014-09-13 | Discharge: 2014-09-13 | Disposition: A | Discharge status: Home / Self Care | Payer: Medicare Other | Source: Ambulatory Visit | Attending: Orthopaedic Surgery | Admitting: Orthopaedic Surgery

## 2014-09-13 DIAGNOSIS — Z01812 Encounter for preprocedural laboratory examination: Secondary | ICD-10-CM | POA: Diagnosis present

## 2014-09-13 DIAGNOSIS — M179 Osteoarthritis of knee, unspecified: Secondary | ICD-10-CM | POA: Diagnosis not present

## 2014-09-13 LAB — SURGICAL PCR SCREEN
MRSA, PCR: NEGATIVE
Staphylococcus aureus: NEGATIVE

## 2014-09-13 LAB — CBC WITH DIFFERENTIAL/PLATELET
Basophils Absolute: 0 10*3/uL (ref 0.0–0.1)
Basophils Relative: 0 % (ref 0–1)
Eosinophils Absolute: 0.3 10*3/uL (ref 0.0–0.7)
Eosinophils Relative: 3 % (ref 0–5)
HCT: 41.7 % (ref 39.0–52.0)
Hemoglobin: 14 g/dL (ref 13.0–17.0)
Lymphocytes Relative: 13 % (ref 12–46)
Lymphs Abs: 1.2 10*3/uL (ref 0.7–4.0)
MCH: 32.2 pg (ref 26.0–34.0)
MCHC: 33.6 g/dL (ref 30.0–36.0)
MCV: 95.9 fL (ref 78.0–100.0)
Monocytes Absolute: 0.7 10*3/uL (ref 0.1–1.0)
Monocytes Relative: 8 % (ref 3–12)
Neutro Abs: 7.3 10*3/uL (ref 1.7–7.7)
Neutrophils Relative %: 76 % (ref 43–77)
Platelets: 267 10*3/uL (ref 150–400)
RBC: 4.35 MIL/uL (ref 4.22–5.81)
RDW: 15.8 % — ABNORMAL HIGH (ref 11.5–15.5)
WBC: 9.6 10*3/uL (ref 4.0–10.5)

## 2014-09-13 LAB — APTT: aPTT: 28 seconds (ref 24–37)

## 2014-09-13 LAB — BASIC METABOLIC PANEL
Anion gap: 6 (ref 5–15)
BUN: 21 mg/dL — ABNORMAL HIGH (ref 6–20)
CO2: 26 mmol/L (ref 22–32)
Calcium: 9.5 mg/dL (ref 8.9–10.3)
Chloride: 106 mmol/L (ref 101–111)
Creatinine, Ser: 0.94 mg/dL (ref 0.61–1.24)
GFR calc Af Amer: >60 mL/min (ref >60)
GFR calc non Af Amer: >60 mL/min (ref >60)
Glucose, Bld: 90 mg/dL (ref 65–99)
Potassium: 4 mmol/L (ref 3.5–5.1)
Sodium: 138 mmol/L (ref 135–145)

## 2014-09-13 LAB — PROTIME-INR
INR: 1.14 (ref 0.00–1.49)
Prothrombin Time: 14.8 seconds (ref 11.6–15.2)

## 2014-09-13 NOTE — Progress Notes (Signed)
Urine spilled and will need to be recollected day of surgery.

## 2014-09-13 NOTE — Progress Notes (Signed)
PCP is Dr. Tiana Loft Cardiologist is Dr Harrington Challenger Pt reports he had a sleep study done in [facility], last year-request sent for report Instructed pt to bring in his mask on the day of surgery. Pt wife states that Dr Katherine Roan was going to send Dr Rhona Raider a neuro clearance  Denies any chest pain. Echo noted in epic from 2015 Stress test pt reports was more than 5 years ago Card cath noted in epic from 12-10-2013.

## 2014-09-16 NOTE — Progress Notes (Signed)
Anesthesia Chart Review:  Pt is 81 year old male scheduled for L total knee arthroplasty on 09/24/2014 with Dr. Rhona Raider.   Cardiologist is Dr. Harrington Challenger, last office visit 02/24/2014. Follow up recommended for next fall.   PMH includes: HTN, OSA, renal insufficiency, asthma, GERD. Former smoker. BMI 40. S/p L4-5 bilateral lumbar laminectomy 12/04/12.   Pt fell in January 2016 and hit head. Has subdural hematoma. Seeing neurosurgery Lavell Anchors at [facility] in [place]), last office visit 08/30/2014. Notes indicate problem has resolved sufficiently that pt may undergo spinal anesthesia for TKA.   Pt seen in ER 05/01/2014 for chest pain. Troponin normal. D-dimer positive but CT angio of chest did not show PE. Determined to be chest wall pain.   Preoperative labs reviewed.   Chest x-ray 02/19/2014 reviewed. Chronic interstitial changes without acute abnormality.   EKG 05/01/2014: Sinus rhythm. Atrial premature complex. Prolonged PR interval. Left axis deviation. Low voltage, precordial leads. Abnormal R-wave progression, early transition  Echo 12/11/2013:  - HPI and indications: Rhythm is noted to be irregularly irregular. - Procedure narrative: Transthoracic echocardiography. Image quality was fair. The study was technically difficult, as a result of poor acoustic windows, poor sound wave transmission, and body habitus. Intravenous contrast (Definity) was administered. - Left ventricle: The cavity size was normal. Wall thickness was increased in a pattern of moderate LVH. Systolic function was mildly to moderately reduced. The estimated ejection fraction was in the range of 40% to 50%, with significant ectopy and beat to beat variation. Definity contrast was administered. No apical thrombus was visualized. Incoordinate septal motion, possible due to LBBB. The study is not technically sufficient to allow evaluation of LV diastolic function. - Aortic valve: Sclerosis without stenosis.  There was no regurgitation. - Mitral valve: Calcified annulus. There was trivial regurgitation. - Left atrium: The atrium was mildly dilated (36 ml/m2). Impressions: LVEF 40-50%, frequent ectopy, moderate LVH, incoordinate septal motion, aortic valve sclerosis, mild LAE, unable to estimate diastolic function.  Cardiac cath 12/10/2013: Coronary Anatomy: Codominant Left Main: Very large caliber vessel that trifurcates into the LAD, Ramus Intermedius, and Circumflex LAD: Normal caliber vessel with diffuse proximal calcification and roughly 20-30% stenosis. There are several small diagonal branches. At the takeoff of a small D1 there is roughly 40% stenosis. Beyond that in the distal vessel there is a roughly 50% focal lesion. No lesions. Flow-limiting. Left Circumflex: Very large caliber, ectatic vessel with a ostial may be 40% stenosis followed by large ectatic segment the goes into the AV groove. There is a AV groove circumflex that courses into the left posterior lateral system and a large lateral OM branch that bifurcates distally. In the proximal eccentric segment there is the appearance of turbulent swirling flow likely due to ectasia/near aneurysmal dilation.. There is no evidence of stenosis. OM1: Large-caliber lateral branch with several small branches. Bifurcates distally into 2 moderate caliber branches. The proximal portion is probably still ectatic. The AV groove circumflex continues distally and bifurcates into 2 small moderate caliber posterior lateral branches. Minimal luminal irregularities. Ramus intermedius: Large-caliber vessel that has an ostial 20% stenosis but is otherwise free of significant disease. It reaches almost all the way down to the apex, tapering distally. Minimal luminal irregularities beyond the ostial lesion. RCA: Normal caliber, codominant vessel that is somewhat ectatic in the mid segment with diffuse mild luminal irregularities of 10-20%. The vessel terminates as the  RPDA which reaches about two thirds with the apex.  If no changes, I anticipate pt  can proceed with surgery as scheduled.   Willeen Cass, FNP-BC [facility]/Anesthesiology Phone: [phone] 09/16/2014 2:40 PM

## 2014-09-18 NOTE — H&P (Signed)
TOTAL KNEE ADMISSION H&P  Patient is being admitted for left total knee arthroplasty.  Subjective:  Chief Complaint:left knee pain.  HPI: Larry Church, 81 y.o. male, has a history of pain and functional disability in the left knee due to arthritis and has failed non-surgical conservative treatments for greater than 12 weeks to includeNSAID's and/or analgesics, corticosteriod injections, use of assistive devices, weight reduction as appropriate and activity modification.  Onset of symptoms was gradual, starting 5 years ago with gradually worsening course since that time. The patient noted no past surgery on the left knee(s).  Patient currently rates pain in the left knee(s) at 10 out of 10 with activity. Patient has night pain, worsening of pain with activity and weight bearing, pain that interferes with activities of daily living, crepitus and joint swelling.  Patient has evidence of subchondral cysts, subchondral sclerosis, periarticular osteophytes and joint space narrowing by imaging studies. There is no active infection.  Patient Active Problem List   Diagnosis Date Noted  . Traumatic subarachnoid hemorrhage 02/20/2014  . Fall 02/19/2014  . CAD (coronary artery disease) 12/25/2013  . Cardiomyopathy, ischemic 12/25/2013  . Essential hypertension 12/25/2013  . Obesity, Class III, BMI 40-49.9 (morbid obesity) 12/25/2013  . Dyslipidemia 12/11/2013  . Unstable angina 12/10/2013  . Fatigue 10/31/2013  . Screening for lipoid disorders 10/31/2013  . Obstructive apnea 10/31/2013  . Cervical pain 09/26/2013  . Beat, premature ventricular 09/13/2013  . Allergic rhinitis 09/10/2013  . Chronic LBP 09/10/2013  . 4Th nerve palsy 09/10/2013  . DDD (degenerative disc disease), lumbosacral 09/10/2013  . Acid reflux 09/10/2013  . History of colon polyps 09/10/2013  . Lumbar canal stenosis 09/10/2013  . Depression, major, recurrent 09/10/2013  . Arthritis, degenerative 09/10/2013  . Atrophic  kidney 09/10/2013  . Compressed spine fracture 09/10/2013   Past Medical History  Diagnosis Date  . GERD (gastroesophageal reflux disease)   . Degenerative arthritis   . Constipation   . Seasonal allergies   . Asthma   . Renal insufficiency   . Anginal pain     3/16  . Hypertension   . Sleep apnea     cpap used 5-7 yrs  . Heart murmur   . Cancer     skin cancer removed top of head and face    Past Surgical History  Procedure Laterality Date  . Hip surgery Bilateral     x3 89.95.02    rt done twice       . Neck surgery  89.98    x2  . Lumbar laminectomy/decompression microdiscectomy Bilateral 12/04/2012    Procedure: BILATERAL LUMBAR LAMINECTOMY/DECOMPRESSION MICRODISCECTOMY LUMBAR FOUR-TO FIVE SACRALONE;  Surgeon: Elaina Hoops, MD;  Location: [facility];  Service: Neurosurgery;  Laterality: Bilateral;  . Cholecystectomy    . Left heart catheterization with coronary angiogram N/A 12/10/2013    Procedure: LEFT HEART CATHETERIZATION WITH CORONARY ANGIOGRAM;  Surgeon: Leonie Man, MD;  Location: [facility] CATH [facility];  Service: Cardiovascular;  Laterality: N/A;  . Cardiac catheterization    . Joint replacement Bilateral 89.95.02    hip rt x2 lft 1  . Back surgery      x2  . Eye surgery Right     right muscle  . Appendectomy  67  . Shoulders Bilateral     bone spurs  . Colonoscopy      No prescriptions prior to admission   Allergies  Allergen Reactions  . Penicillins Swelling and Other (See Comments)    Throat  Swelling  . Bee Venom Other (See Comments)    unknown    History  Substance Use Topics  . Smoking status: Former Smoker -- 1.00 packs/day    Types: Cigarettes    Quit date: 02/16/1960  . Smokeless tobacco: Not on file  . Alcohol Use: No    Family History  Problem Relation Age of Onset  . Heart attack Neg Hx   . Stroke Neg Hx   . Hypertension Neg Hx   . Cancer Mother   . Cancer Brother      Review of Systems  Musculoskeletal: Positive for joint pain.        Left knee  All other systems reviewed and are negative.   Objective:  Physical Exam  Constitutional: He is oriented to person, place, and time. He appears well-developed and well-nourished.  HENT:  Head: Normocephalic and atraumatic.  Eyes: Conjunctivae are normal. Pupils are equal, round, and reactive to light.  Neck: Normal range of motion.  Cardiovascular: Normal rate and regular rhythm.   Respiratory: Effort normal.  GI: Soft.  Musculoskeletal:  Left knee has moderate valgus deformity.  His motion is from about 0-110.  There is no effusion.  He has crepitation and lateral joint line pain.  Hip motion is full and straight leg raise is negative.  Opposite knee has a lesser valgus deformity with about the same motion.  Sensation and motor function are intact in his feet with palpable pulses on both sides.  He has normal unlabored respirations.  There is no palpable lymphadenopathy behind either knee.   Neurological: He is alert and oriented to person, place, and time.  Skin: Skin is warm and dry.  Psychiatric: He has a normal mood and affect. His behavior is normal. Judgment and thought content normal.    Vital signs in last 24 hours:    Labs:   Estimated body mass index is 40.02 kg/(m^2) as calculated from the following:   Height as of 06/21/14: 5\' 5"  (1.651 m).   Weight as of 06/21/14: 109.1 kg (240 lb 8.4 oz).   Imaging Review Plain radiographs demonstrate severe degenerative joint disease of the left knee(s). The overall alignment ismild valgus. The bone quality appears to be good for age and reported activity level.  Assessment/Plan:  End stage primary arthritis, left knee   The patient history, physical examination, clinical judgment of the provider and imaging studies are consistent with end stage degenerative joint disease of the left knee(s) and total knee arthroplasty is deemed medically necessary. The treatment options including medical management, injection  therapy arthroscopy and arthroplasty were discussed at length. The risks and benefits of total knee arthroplasty were presented and reviewed. The risks due to aseptic loosening, infection, stiffness, patella tracking problems, thromboembolic complications and other imponderables were discussed. The patient acknowledged the explanation, agreed to proceed with the plan and consent was signed. Patient is being admitted for inpatient treatment for surgery, pain control, PT, OT, prophylactic antibiotics, VTE prophylaxis, progressive ambulation and ADL's and discharge planning. The patient is planning to be discharged home with home health services

## 2014-09-23 MED ORDER — VANCOMYCIN HCL 10 G IV SOLR
1500.0000 mg | INTRAVENOUS | Status: AC
  Administered 2014-09-24: 1500 mg via INTRAVENOUS
  Filled 2014-09-23: qty 1500

## 2014-09-24 ENCOUNTER — Encounter (HOSPITAL_COMMUNITY): Payer: Self-pay | Admitting: Certified Registered"

## 2014-09-24 ENCOUNTER — Encounter (HOSPITAL_COMMUNITY)
Admission: RE | Disposition: A | Discharge status: Home Health | Payer: Self-pay | Source: Ambulatory Visit | Attending: Orthopaedic Surgery

## 2014-09-24 ENCOUNTER — Inpatient Hospital Stay (HOSPITAL_COMMUNITY): Payer: Medicare Other | Admitting: Emergency Medicine

## 2014-09-24 ENCOUNTER — Inpatient Hospital Stay (HOSPITAL_COMMUNITY)
Admission: RE | Admit: 2014-09-24 | Discharge: 2014-09-27 | DRG: 470 | Disposition: A | Discharge status: Home Health | Payer: Medicare Other | Source: Ambulatory Visit | Attending: Orthopaedic Surgery | Admitting: Orthopaedic Surgery

## 2014-09-24 ENCOUNTER — Inpatient Hospital Stay (HOSPITAL_COMMUNITY): Payer: Medicare Other | Admitting: Anesthesiology

## 2014-09-24 DIAGNOSIS — K219 Gastro-esophageal reflux disease without esophagitis: Secondary | ICD-10-CM | POA: Diagnosis present

## 2014-09-24 DIAGNOSIS — I255 Ischemic cardiomyopathy: Secondary | ICD-10-CM | POA: Diagnosis present

## 2014-09-24 DIAGNOSIS — Z91030 Bee allergy status: Secondary | ICD-10-CM

## 2014-09-24 DIAGNOSIS — Z96643 Presence of artificial hip joint, bilateral: Secondary | ICD-10-CM | POA: Diagnosis present

## 2014-09-24 DIAGNOSIS — Z6841 Body Mass Index (BMI) 40.0 and over, adult: Secondary | ICD-10-CM | POA: Diagnosis not present

## 2014-09-24 DIAGNOSIS — M1712 Unilateral primary osteoarthritis, left knee: Secondary | ICD-10-CM | POA: Diagnosis present

## 2014-09-24 DIAGNOSIS — I2510 Atherosclerotic heart disease of native coronary artery without angina pectoris: Secondary | ICD-10-CM | POA: Diagnosis present

## 2014-09-24 DIAGNOSIS — G4733 Obstructive sleep apnea (adult) (pediatric): Secondary | ICD-10-CM | POA: Diagnosis present

## 2014-09-24 DIAGNOSIS — Z87891 Personal history of nicotine dependence: Secondary | ICD-10-CM

## 2014-09-24 DIAGNOSIS — I10 Essential (primary) hypertension: Secondary | ICD-10-CM | POA: Diagnosis present

## 2014-09-24 DIAGNOSIS — Z880 Allergy status to penicillin: Secondary | ICD-10-CM

## 2014-09-24 DIAGNOSIS — J45909 Unspecified asthma, uncomplicated: Secondary | ICD-10-CM | POA: Diagnosis present

## 2014-09-24 DIAGNOSIS — M1710 Unilateral primary osteoarthritis, unspecified knee: Secondary | ICD-10-CM | POA: Diagnosis present

## 2014-09-24 HISTORY — DX: Unspecified osteoarthritis, unspecified site: M19.90

## 2014-09-24 HISTORY — DX: Atherosclerotic heart disease of native coronary artery without angina pectoris: I25.10

## 2014-09-24 HISTORY — DX: Low back pain: M54.5

## 2014-09-24 HISTORY — DX: Pure hypercholesterolemia, unspecified: E78.00

## 2014-09-24 HISTORY — DX: Dependence on other enabling machines and devices: Z99.89

## 2014-09-24 HISTORY — DX: Other chronic pain: G89.29

## 2014-09-24 HISTORY — DX: Major depressive disorder, single episode, unspecified: F32.9

## 2014-09-24 HISTORY — DX: Depression, unspecified: F32.A

## 2014-09-24 HISTORY — DX: Obstructive sleep apnea (adult) (pediatric): G47.33

## 2014-09-24 HISTORY — DX: Chronic sinusitis, unspecified: J32.9

## 2014-09-24 HISTORY — DX: Anxiety disorder, unspecified: F41.9

## 2014-09-24 HISTORY — DX: Low back pain, unspecified: M54.50

## 2014-09-24 HISTORY — DX: Unspecified chronic bronchitis: J42

## 2014-09-24 HISTORY — DX: Basal cell carcinoma of skin, unspecified: C44.91

## 2014-09-24 HISTORY — PX: TOTAL KNEE ARTHROPLASTY: SHX125

## 2014-09-24 LAB — URINALYSIS, ROUTINE W REFLEX MICROSCOPIC
Bilirubin Urine: NEGATIVE
Glucose, UA: NEGATIVE mg/dL
Hgb urine dipstick: NEGATIVE
Ketones, ur: NEGATIVE mg/dL
Leukocytes, UA: NEGATIVE
Nitrite: NEGATIVE
Protein, ur: NEGATIVE mg/dL
Specific Gravity, Urine: 1.013 (ref 1.005–1.030)
Urobilinogen, UA: 0.2 mg/dL (ref 0.0–1.0)
pH: 6.5 (ref 5.0–8.0)

## 2014-09-24 SURGERY — ARTHROPLASTY, KNEE, TOTAL
Anesthesia: Monitor Anesthesia Care | Site: Knee | Laterality: Left

## 2014-09-24 MED ORDER — FLUTICASONE PROPIONATE 50 MCG/ACT NA SUSP: 1.0000 | Freq: Every day | NASAL | Status: DC | PRN

## 2014-09-24 MED ORDER — ACETAMINOPHEN 325 MG PO TABS: 650.0000 mg | ORAL_TABLET | Freq: Four times a day (QID) | ORAL | Status: DC | PRN

## 2014-09-24 MED ORDER — FENTANYL CITRATE (PF) 100 MCG/2ML IJ SOLN
25.0000 ug | INTRAMUSCULAR | Status: DC | PRN
  Administered 2014-09-24 (×2): 50 ug via INTRAVENOUS

## 2014-09-24 MED ORDER — BUPIVACAINE LIPOSOME 1.3 % IJ SUSP
20.0000 mL | INTRAMUSCULAR | Status: DC
  Filled 2014-09-24: qty 20

## 2014-09-24 MED ORDER — ATORVASTATIN CALCIUM 80 MG PO TABS
80.0000 mg | ORAL_TABLET | Freq: Every day | ORAL | Status: DC
  Administered 2014-09-25 – 2014-09-26 (×2): 80 mg via ORAL
  Filled 2014-09-24 (×2): qty 1

## 2014-09-24 MED ORDER — METOCLOPRAMIDE HCL 5 MG/ML IJ SOLN: 5.0000 mg | Freq: Three times a day (TID) | INTRAMUSCULAR | Status: DC | PRN

## 2014-09-24 MED ORDER — BUPIVACAINE LIPOSOME 1.3 % IJ SUSP
INTRAMUSCULAR | Status: DC | PRN
  Administered 2014-09-24: 20 mL

## 2014-09-24 MED ORDER — ONDANSETRON HCL 4 MG/2ML IJ SOLN: 4.0000 mg | Freq: Four times a day (QID) | INTRAMUSCULAR | Status: DC | PRN

## 2014-09-24 MED ORDER — ASPIRIN EC 325 MG PO TBEC
325.0000 mg | DELAYED_RELEASE_TABLET | Freq: Two times a day (BID) | ORAL | Status: DC
  Administered 2014-09-25 – 2014-09-27 (×5): 325 mg via ORAL
  Filled 2014-09-24 (×5): qty 1

## 2014-09-24 MED ORDER — METHOCARBAMOL 500 MG PO TABS
500.0000 mg | ORAL_TABLET | Freq: Four times a day (QID) | ORAL | Status: DC | PRN
  Administered 2014-09-24 – 2014-09-26 (×3): 500 mg via ORAL
  Filled 2014-09-24 (×4): qty 1

## 2014-09-24 MED ORDER — NITROGLYCERIN 0.4 MG SL SUBL: 0.4000 mg | SUBLINGUAL_TABLET | SUBLINGUAL | Status: DC | PRN

## 2014-09-24 MED ORDER — LORATADINE 10 MG PO TABS
10.0000 mg | ORAL_TABLET | Freq: Every day | ORAL | Status: DC
  Administered 2014-09-25 – 2014-09-27 (×3): 10 mg via ORAL
  Filled 2014-09-24 (×3): qty 1

## 2014-09-24 MED ORDER — METHOCARBAMOL 500 MG PO TABS
ORAL_TABLET | ORAL | Status: AC
  Filled 2014-09-24: qty 1

## 2014-09-24 MED ORDER — CHLORHEXIDINE GLUCONATE 4 % EX LIQD: 60.0000 mL | Freq: Once | CUTANEOUS | Status: DC

## 2014-09-24 MED ORDER — OXYCODONE HCL 5 MG PO TABS
ORAL_TABLET | ORAL | Status: AC
  Administered 2014-09-24: 5 mg
  Filled 2014-09-24: qty 1

## 2014-09-24 MED ORDER — PHENYLEPHRINE HCL 10 MG/ML IJ SOLN
INTRAMUSCULAR | Status: DC | PRN
  Administered 2014-09-24 (×2): 80 ug via INTRAVENOUS
  Administered 2014-09-24: 40 ug via INTRAVENOUS
  Administered 2014-09-24: 80 ug via INTRAVENOUS

## 2014-09-24 MED ORDER — HYDROCODONE-ACETAMINOPHEN 5-325 MG PO TABS
ORAL_TABLET | ORAL | Status: AC
  Filled 2014-09-24: qty 2

## 2014-09-24 MED ORDER — VANCOMYCIN HCL IN DEXTROSE 1-5 GM/200ML-% IV SOLN
1000.0000 mg | Freq: Two times a day (BID) | INTRAVENOUS | Status: AC
  Administered 2014-09-24: 1000 mg via INTRAVENOUS
  Filled 2014-09-24: qty 200

## 2014-09-24 MED ORDER — HYDROMORPHONE HCL 1 MG/ML IJ SOLN
0.5000 mg | INTRAMUSCULAR | Status: DC | PRN
  Administered 2014-09-24 – 2014-09-25 (×3): 1 mg via INTRAVENOUS
  Filled 2014-09-24 (×4): qty 1

## 2014-09-24 MED ORDER — TRANEXAMIC ACID 1000 MG/10ML IV SOLN
2000.0000 mg | INTRAVENOUS | Status: DC | PRN
  Administered 2014-09-24: 2000 mg via INTRAVENOUS

## 2014-09-24 MED ORDER — METOPROLOL TARTRATE 12.5 MG HALF TABLET
12.5000 mg | ORAL_TABLET | Freq: Two times a day (BID) | ORAL | Status: DC
  Administered 2014-09-24 – 2014-09-27 (×6): 12.5 mg via ORAL
  Filled 2014-09-24 (×6): qty 1

## 2014-09-24 MED ORDER — METOCLOPRAMIDE HCL 10 MG PO TABS: 5.0000 mg | ORAL_TABLET | Freq: Three times a day (TID) | ORAL | Status: DC | PRN

## 2014-09-24 MED ORDER — MENTHOL 3 MG MT LOZG
1.0000 | LOZENGE | OROMUCOSAL | Status: DC | PRN
  Administered 2014-09-24: 3 mg via ORAL
  Filled 2014-09-24: qty 9

## 2014-09-24 MED ORDER — ALBUTEROL SULFATE (2.5 MG/3ML) 0.083% IN NEBU: 2.5000 mg | INHALATION_SOLUTION | Freq: Four times a day (QID) | RESPIRATORY_TRACT | Status: DC | PRN

## 2014-09-24 MED ORDER — FENTANYL CITRATE (PF) 100 MCG/2ML IJ SOLN
INTRAMUSCULAR | Status: AC
  Filled 2014-09-24: qty 2

## 2014-09-24 MED ORDER — DARIFENACIN HYDROBROMIDE ER 7.5 MG PO TB24
7.5000 mg | ORAL_TABLET | Freq: Every day | ORAL | Status: DC
  Administered 2014-09-24 – 2014-09-27 (×4): 7.5 mg via ORAL
  Filled 2014-09-24 (×4): qty 1

## 2014-09-24 MED ORDER — ONDANSETRON HCL 4 MG PO TABS: 4.0000 mg | ORAL_TABLET | Freq: Four times a day (QID) | ORAL | Status: DC | PRN

## 2014-09-24 MED ORDER — BUPIVACAINE-EPINEPHRINE (PF) 0.25% -1:200000 IJ SOLN
INTRAMUSCULAR | Status: AC
  Filled 2014-09-24: qty 30

## 2014-09-24 MED ORDER — LACTATED RINGERS IV SOLN
INTRAVENOUS | Status: DC
  Administered 2014-09-25: 02:00:00 via INTRAVENOUS
  Administered 2014-09-26: 50 mL/h via INTRAVENOUS

## 2014-09-24 MED ORDER — VENLAFAXINE HCL ER 150 MG PO CP24
150.0000 mg | ORAL_CAPSULE | Freq: Every day | ORAL | Status: DC
  Administered 2014-09-25 – 2014-09-27 (×3): 150 mg via ORAL
  Filled 2014-09-24 (×3): qty 1

## 2014-09-24 MED ORDER — LACTATED RINGERS IV SOLN: INTRAVENOUS | Status: DC

## 2014-09-24 MED ORDER — DOCUSATE SODIUM 100 MG PO CAPS
100.0000 mg | ORAL_CAPSULE | Freq: Two times a day (BID) | ORAL | Status: DC
  Administered 2014-09-24 – 2014-09-27 (×6): 100 mg via ORAL
  Filled 2014-09-24 (×7): qty 1

## 2014-09-24 MED ORDER — ONDANSETRON HCL 4 MG/2ML IJ SOLN: 4.0000 mg | Freq: Once | INTRAMUSCULAR | Status: DC | PRN

## 2014-09-24 MED ORDER — PHENOL 1.4 % MT LIQD: 1.0000 | OROMUCOSAL | Status: DC | PRN

## 2014-09-24 MED ORDER — SODIUM CHLORIDE 0.9 % IV SOLN
2000.0000 mg | Freq: Once | INTRAVENOUS | Status: DC
  Filled 2014-09-24 (×2): qty 20

## 2014-09-24 MED ORDER — MENTHOL 3 MG MT LOZG
LOZENGE | OROMUCOSAL | Status: AC
  Filled 2014-09-24: qty 9

## 2014-09-24 MED ORDER — AZELASTINE HCL 0.15 % NA SOLN: 2.0000 | Freq: Every day | NASAL | Status: DC | PRN

## 2014-09-24 MED ORDER — LACTATED RINGERS IV SOLN
INTRAVENOUS | Status: DC
  Administered 2014-09-24 (×2): via INTRAVENOUS

## 2014-09-24 MED ORDER — HYDROCODONE-ACETAMINOPHEN 5-325 MG PO TABS
1.0000 | ORAL_TABLET | ORAL | Status: DC | PRN
  Administered 2014-09-24 (×2): 2 via ORAL
  Administered 2014-09-25: 1 via ORAL
  Administered 2014-09-25 (×2): 2 via ORAL
  Administered 2014-09-26: 1 via ORAL
  Administered 2014-09-26 (×4): 2 via ORAL
  Administered 2014-09-27 (×2): 1 via ORAL
  Filled 2014-09-24 (×5): qty 2
  Filled 2014-09-24: qty 1
  Filled 2014-09-24: qty 2
  Filled 2014-09-24: qty 1
  Filled 2014-09-24: qty 2
  Filled 2014-09-24: qty 1
  Filled 2014-09-24: qty 2

## 2014-09-24 MED ORDER — BUPIVACAINE-EPINEPHRINE (PF) 0.25% -1:200000 IJ SOLN
INTRAMUSCULAR | Status: DC | PRN
  Administered 2014-09-24: 20 mL

## 2014-09-24 MED ORDER — BISACODYL 5 MG PO TBEC
5.0000 mg | DELAYED_RELEASE_TABLET | Freq: Every day | ORAL | Status: DC | PRN
  Administered 2014-09-26: 5 mg via ORAL
  Filled 2014-09-24: qty 1

## 2014-09-24 MED ORDER — ALBUTEROL SULFATE HFA 108 (90 BASE) MCG/ACT IN AERS: 2.0000 | INHALATION_SPRAY | Freq: Four times a day (QID) | RESPIRATORY_TRACT | Status: DC | PRN

## 2014-09-24 MED ORDER — FENTANYL CITRATE (PF) 250 MCG/5ML IJ SOLN
INTRAMUSCULAR | Status: AC
  Filled 2014-09-24: qty 5

## 2014-09-24 MED ORDER — MIDAZOLAM HCL 2 MG/2ML IJ SOLN
INTRAMUSCULAR | Status: AC
  Filled 2014-09-24: qty 4

## 2014-09-24 MED ORDER — DONEPEZIL HCL 5 MG PO TABS
5.0000 mg | ORAL_TABLET | Freq: Every day | ORAL | Status: DC
  Administered 2014-09-24 – 2014-09-26 (×3): 5 mg via ORAL
  Filled 2014-09-24 (×3): qty 1

## 2014-09-24 MED ORDER — SODIUM CHLORIDE 0.9 % IR SOLN
Status: DC | PRN
  Administered 2014-09-24: 3000 mL

## 2014-09-24 MED ORDER — PROPOFOL 10 MG/ML IV BOLUS
INTRAVENOUS | Status: DC | PRN
  Administered 2014-09-24: 20 mg via INTRAVENOUS

## 2014-09-24 MED ORDER — GUAIFENESIN ER 600 MG PO TB12: 1200.0000 mg | ORAL_TABLET | Freq: Two times a day (BID) | ORAL | Status: DC | PRN

## 2014-09-24 MED ORDER — MENTHOL 3 MG MT LOZG: 1.0000 | LOZENGE | OROMUCOSAL | Status: DC | PRN

## 2014-09-24 MED ORDER — 0.9 % SODIUM CHLORIDE (POUR BTL) OPTIME
TOPICAL | Status: DC | PRN
  Administered 2014-09-24: 1000 mL

## 2014-09-24 MED ORDER — LIDOCAINE HCL (CARDIAC) 20 MG/ML IV SOLN
INTRAVENOUS | Status: AC
  Filled 2014-09-24: qty 5

## 2014-09-24 MED ORDER — METHOCARBAMOL 1000 MG/10ML IJ SOLN
500.0000 mg | Freq: Four times a day (QID) | INTRAMUSCULAR | Status: DC | PRN
  Filled 2014-09-24: qty 5

## 2014-09-24 MED ORDER — PROPOFOL INFUSION 10 MG/ML OPTIME
INTRAVENOUS | Status: DC | PRN
  Administered 2014-09-24: 75 ug/kg/min via INTRAVENOUS

## 2014-09-24 MED ORDER — OLOPATADINE HCL 0.6 % NA SOLN: 1.0000 | NASAL | Status: DC | PRN

## 2014-09-24 MED ORDER — DIPHENHYDRAMINE HCL 12.5 MG/5ML PO ELIX
12.5000 mg | ORAL_SOLUTION | ORAL | Status: DC | PRN
Start: 2014-09-24 — End: 2014-09-27
  Filled 2014-09-24: qty 10

## 2014-09-24 MED ORDER — PANTOPRAZOLE SODIUM 40 MG PO TBEC
40.0000 mg | DELAYED_RELEASE_TABLET | Freq: Every day | ORAL | Status: DC
  Administered 2014-09-25 – 2014-09-27 (×3): 40 mg via ORAL
  Filled 2014-09-24 (×3): qty 1

## 2014-09-24 MED ORDER — ACETAMINOPHEN 650 MG RE SUPP: 650.0000 mg | Freq: Four times a day (QID) | RECTAL | Status: DC | PRN

## 2014-09-24 MED ORDER — ALUM & MAG HYDROXIDE-SIMETH 200-200-20 MG/5ML PO SUSP: 30.0000 mL | ORAL | Status: DC | PRN

## 2014-09-24 SURGICAL SUPPLY — 70 items
APL SKNCLS STERI-STRIP NONHPOA (GAUZE/BANDAGES/DRESSINGS) ×1
BAG DECANTER FOR FLEXI CONT (MISCELLANEOUS) ×3 IMPLANT
BANDAGE ELASTIC 4 VELCRO ST LF (GAUZE/BANDAGES/DRESSINGS) ×1 IMPLANT
BANDAGE ELASTIC 6 VELCRO ST LF (GAUZE/BANDAGES/DRESSINGS) ×2 IMPLANT
BANDAGE ESMARK 6X9 LF (GAUZE/BANDAGES/DRESSINGS) ×1 IMPLANT
BENZOIN TINCTURE PRP APPL 2/3 (GAUZE/BANDAGES/DRESSINGS) ×3 IMPLANT
BLADE SAGITTAL 25.0X1.19X90 (BLADE) ×1 IMPLANT
BLADE SAGITTAL 25.0X1.19X90MM (BLADE) ×1
BLADE SAW SGTL 13.0X1.19X90.0M (BLADE) IMPLANT
BLADE SURG ROTATE 9660 (MISCELLANEOUS) IMPLANT
BNDG CMPR 9X6 STRL LF SNTH (GAUZE/BANDAGES/DRESSINGS) ×1
BNDG CMPR MED 10X6 ELC LF (GAUZE/BANDAGES/DRESSINGS) ×1
BNDG ELASTIC 6X10 VLCR STRL LF (GAUZE/BANDAGES/DRESSINGS) ×3 IMPLANT
BNDG ESMARK 6X9 LF (GAUZE/BANDAGES/DRESSINGS) ×3
BNDG GAUZE ELAST 4 BULKY (GAUZE/BANDAGES/DRESSINGS) ×4 IMPLANT
BOWL SMART MIX CTS (DISPOSABLE) ×3 IMPLANT
CAP KNEE TOTAL 3 SIGMA ×2 IMPLANT
CEMENT HV SMART SET (Cement) ×6 IMPLANT
CLOSURE WOUND 1/2 X4 (GAUZE/BANDAGES/DRESSINGS) ×1
COVER SURGICAL LIGHT HANDLE (MISCELLANEOUS) ×3 IMPLANT
CUFF TOURNIQUET SINGLE 34IN LL (TOURNIQUET CUFF) ×3 IMPLANT
CUFF TOURNIQUET SINGLE 44IN (TOURNIQUET CUFF) IMPLANT
DRAPE EXTREMITY T 121X128X90 (DRAPE) ×3 IMPLANT
DRAPE IMP U-DRAPE 54X76 (DRAPES) ×3 IMPLANT
DRAPE PROXIMA HALF (DRAPES) ×3 IMPLANT
DRAPE U-SHAPE 47X51 STRL (DRAPES) ×3 IMPLANT
DRSG ADAPTIC 3X8 NADH LF (GAUZE/BANDAGES/DRESSINGS) ×3 IMPLANT
DURAPREP 26ML APPLICATOR (WOUND CARE) ×3 IMPLANT
ELECT REM PT RETURN 9FT ADLT (ELECTROSURGICAL) ×3
ELECTRODE REM PT RTRN 9FT ADLT (ELECTROSURGICAL) ×1 IMPLANT
GAUZE SPONGE 4X4 12PLY STRL (GAUZE/BANDAGES/DRESSINGS) ×3 IMPLANT
GLOVE BIO SURGEON STRL SZ8 (GLOVE) ×6 IMPLANT
GLOVE BIOGEL PI IND STRL 8 (GLOVE) ×2 IMPLANT
GLOVE BIOGEL PI INDICATOR 8 (GLOVE) ×4
GOWN STRL REUS W/ TWL LRG LVL3 (GOWN DISPOSABLE) ×1 IMPLANT
GOWN STRL REUS W/ TWL XL LVL3 (GOWN DISPOSABLE) ×2 IMPLANT
GOWN STRL REUS W/TWL LRG LVL3 (GOWN DISPOSABLE) ×3
GOWN STRL REUS W/TWL XL LVL3 (GOWN DISPOSABLE) ×6
HANDPIECE INTERPULSE COAX TIP (DISPOSABLE) ×3
HOOD PEEL AWAY FACE SHEILD DIS (HOOD) ×6 IMPLANT
IMMOBILIZER KNEE 20 (SOFTGOODS) IMPLANT
IMMOBILIZER KNEE 22 UNIV (SOFTGOODS) ×3 IMPLANT
IMMOBILIZER KNEE 24 THIGH 36 (MISCELLANEOUS) IMPLANT
IMMOBILIZER KNEE 24 UNIV (MISCELLANEOUS)
KIT BASIN OR (CUSTOM PROCEDURE TRAY) ×3 IMPLANT
KIT ROOM TURNOVER OR (KITS) ×3 IMPLANT
MANIFOLD NEPTUNE II (INSTRUMENTS) ×3 IMPLANT
NDL HYPO 21X1 ECLIPSE (NEEDLE) ×1 IMPLANT
NEEDLE HYPO 21X1 ECLIPSE (NEEDLE) ×3 IMPLANT
NS IRRIG 1000ML POUR BTL (IV SOLUTION) ×3 IMPLANT
PACK TOTAL JOINT (CUSTOM PROCEDURE TRAY) ×3 IMPLANT
PACK UNIVERSAL I (CUSTOM PROCEDURE TRAY) ×3 IMPLANT
PAD ABD 8X10 STRL (GAUZE/BANDAGES/DRESSINGS) ×2 IMPLANT
PAD ARMBOARD 7.5X6 YLW CONV (MISCELLANEOUS) ×6 IMPLANT
SET HNDPC FAN SPRY TIP SCT (DISPOSABLE) ×1 IMPLANT
STAPLER VISISTAT 35W (STAPLE) IMPLANT
STRIP CLOSURE SKIN 1/2X4 (GAUZE/BANDAGES/DRESSINGS) ×2 IMPLANT
SUCTION FRAZIER TIP 10 FR DISP (SUCTIONS) IMPLANT
SUT MNCRL AB 3-0 PS2 18 (SUTURE) ×2 IMPLANT
SUT VIC AB 0 CT1 27 (SUTURE) ×6
SUT VIC AB 0 CT1 27XBRD ANBCTR (SUTURE) ×2 IMPLANT
SUT VIC AB 2-0 CT1 27 (SUTURE) ×6
SUT VIC AB 2-0 CT1 TAPERPNT 27 (SUTURE) ×2 IMPLANT
SUT VLOC 180 0 24IN GS25 (SUTURE) ×3 IMPLANT
SYR 50ML LL SCALE MARK (SYRINGE) ×3 IMPLANT
TOWEL OR 17X24 6PK STRL BLUE (TOWEL DISPOSABLE) ×3 IMPLANT
TOWEL OR 17X26 10 PK STRL BLUE (TOWEL DISPOSABLE) ×3 IMPLANT
TRAY FOLEY CATH 14FR (SET/KITS/TRAYS/PACK) IMPLANT
UPCHARGE REV TRAY MBT KNEE ×2 IMPLANT
WATER STERILE IRR 1000ML POUR (IV SOLUTION) ×4 IMPLANT

## 2014-09-24 NOTE — Progress Notes (Signed)
Pt arrived from pacu to the unit at 1850, VSS; pt oriented to the unit and room, call light within reach. LLE in CPM and SCD's on RLE. Pt wife at bedside; pt voided 181ml upon arrival to the room; reported off to oncoming RN. Larry Heady RN

## 2014-09-24 NOTE — Anesthesia Preprocedure Evaluation (Signed)
Anesthesia Evaluation  Patient identified by MRN, date of birth, ID band Patient awake    Reviewed: Allergy & Precautions, NPO status , Patient's Chart, lab work & pertinent test results  Airway Mallampati: II  TM Distance: >3 FB     Dental  (+) Teeth Intact, Dental Advisory Given   Pulmonary former smoker,  breath sounds clear to auscultation        Cardiovascular hypertension, Rhythm:Regular Rate:Normal     Neuro/Psych    GI/Hepatic   Endo/Other    Renal/GU      Musculoskeletal   Abdominal   Peds  Hematology   Anesthesia Other Findings   Reproductive/Obstetrics                             Anesthesia Physical Anesthesia Plan  ASA: III  Anesthesia Plan: MAC and Spinal   Post-op Pain Management:    Induction:   Airway Management Planned: Natural Airway and Nasal Cannula  Additional Equipment:   Intra-op Plan:   Post-operative Plan:   Informed Consent: I have reviewed the patients History and Physical, chart, labs and discussed the procedure including the risks, benefits and alternatives for the proposed anesthesia with the patient or authorized representative who has indicated his/her understanding and acceptance.   Dental advisory given  Plan Discussed with: CRNA and Anesthesiologist  Anesthesia Plan Comments: (DJD L. Knee Mild-Moderate non-obstructive CAD Htn H/O Fall with small SAH 02/20/14 now resolved RAD  Plan SAB  Roberts Gaudy )        Anesthesia Quick Evaluation

## 2014-09-24 NOTE — Anesthesia Postprocedure Evaluation (Signed)
  Anesthesia Post-op Note  Patient: BURLON CENTRELLA  Procedure(s) Performed: Procedure(s): TOTAL KNEE ARTHROPLASTY (Left)  Patient Location: PACU  Anesthesia Type:Spinal  Level of Consciousness: awake, alert  and oriented  Airway and Oxygen Therapy: Patient Spontanous Breathing and Patient connected to nasal cannula oxygen  Post-op Pain: mild  Post-op Assessment: Post-op Vital signs reviewed, Patient's Cardiovascular Status Stable, Respiratory Function Stable, Patent Airway and Pain level controlled LLE Motor Response: Other (Comment) (unable to wiggle toes at this time) LLE Sensation: Decreased, Tingling (due to spinal) RLE Motor Response: Purposeful movement (able to bend knee up off bed, wiggles toes) RLE Sensation: Decreased, Tingling (due to spinal) L Sensory Level: S1-Sole of foot, small toes R Sensory Level: S1-Sole of foot, small toes  Post-op Vital Signs: stable  Last Vitals:  Filed Vitals:   09/24/14 1300  BP: 116/93  Pulse: 66  Temp:   Resp: 12    Complications: No apparent anesthesia complications

## 2014-09-24 NOTE — Transfer of Care (Signed)
Immediate Anesthesia Transfer of Care Note  Patient: Larry Church  Procedure(s) Performed: Procedure(s): TOTAL KNEE ARTHROPLASTY (Left)  Patient Location: PACU  Anesthesia Type:Spinal  Level of Consciousness: awake and patient cooperative  Airway & Oxygen Therapy: Patient Spontanous Breathing  Post-op Assessment: Report given to RN and Post -op Vital signs reviewed and stable  Post vital signs: Reviewed and stable  Last Vitals:  Filed Vitals:   09/24/14 0827  BP: 133/76  Pulse: 78  Temp: 36.8 C  Resp: 20    Complications: No apparent anesthesia complications

## 2014-09-24 NOTE — Interval H&P Note (Signed)
OK for surgery PD 

## 2014-09-24 NOTE — Progress Notes (Signed)
Orthopedic Tech Progress Note Patient Details:  Larry Church 02/15/1933 [id number]  CPM Left Knee CPM Left Knee: On Left Knee Flexion (Degrees): 90 Left Knee Extension (Degrees): 0 Additional Comments: Trapeze bar and foot roll   [name], [name] 09/24/2014, 1:40 PM

## 2014-09-24 NOTE — Progress Notes (Signed)
Patient complaining of severe pressure in bladder area. Bladder scan peformed, 962ml of urine revealed. Orders given from Loni Dolly PA to in and out cath times one. Will perform cath and continue to monitor for void.

## 2014-09-24 NOTE — Op Note (Signed)
PREOP DIAGNOSIS: DJD LEFT KNEE POSTOP DIAGNOSIS:  same PROCEDURE: LEFT TKR ANESTHESIA: Spinal and block ATTENDING SURGEON: Damia Bobrowski G ASSISTANT: Loni Dolly PA  INDICATIONS FOR PROCEDURE: Larry Church is a 81 y.o. male who has struggled for a long time with pain due to degenerative arthritis of the left knee.  The patient has failed many conservative non-operative measures and at this point has pain which limits the ability to sleep and walk.  The patient is offered total knee replacement.  Informed operative consent was obtained after discussion of possible risks of anesthesia, infection, neurovascular injury, DVT, and death.  The importance of the post-operative rehabilitation protocol to optimize result was stressed extensively with the patient.  SUMMARY OF FINDINGS AND PROCEDURE:  Larry Church was taken to the operative suite where under the above anesthesia a left knee replacement was performed.  There were advanced degenerative changes and the bone quality was excellent.  We used the DePuyLCS system and placed size large femur, 5 MBT revision tibia, 38 mm all polyethylene patella, and a size 10 mm spacer.  Loni Dolly PA-C assisted throughout and was invaluable to the completion of the case in that he helped retract and maintain exposure while I placed the components.  He also helped close thereby minimizing OR time.  The patient was admitted for appropriate post-op care to include perioperative antibiotics and mechanical and pharmacologic measures for DVT prophylaxis.  DESCRIPTION OF PROCEDURE:  Larry Church was taken to the operative suite where the above anesthesia was applied.  The patient was positioned supine and prepped and draped in normal sterile fashion.  An appropriate time out was performed.  After the administration of vancomycin pre-op antibiotic the leg was elevated and exsanguinated and a tourniquet inflated.  A standard longitudinal incision was made on the anterior  knee.  Dissection was carried down to the extensor mechanism.  All appropriate anti-infective measures were used including the pre-operative antibiotic, betadine impregnated drape, and closed hooded exhaust systems for each member of the surgical team.  A medial parapatellar incision was made in the extensor mechanism and the knee cap flipped and the knee flexed.  Some residual meniscal tissues were removed along with any remaining ACL/PCL tissue.  A guide was placed on the tibia and a flat cut was made on it's superior surface.  An intramedullary guide was placed in the femur and was utilized to make anterior and posterior cuts creating an appropriate flexion gap.  A second intramedullary guide was placed in the femur to make a distal cut properly balancing the knee with an extension gap equal to the flexion gap.  The three bones sized to the above mentioned sizes and the appropriate guides were placed and utilized.  A trial reduction was done and the knee easily came to full extension and the patella tracked well on flexion.  The trial components were removed and all bones were cleaned with pulsatile lavage and then dried thoroughly.  Cement was mixed and was pressurized onto the bones followed by placement of the aforementioned components.  Excess cement was trimmed and pressure was held on the components until the cement had hardened.  The tourniquet was deflated and a small amount of bleeding was controlled with cautery and pressure.  The knee was irrigated thoroughly.  The extensor mechanism was re-approximated with V-loc suture in running fashion.  The knee was flexed and the repair was solid.  The subcutaneous tissues were re-approximated with #0 and #2-0 vicryl and the  skin closed with a subcuticular stitch and steristrips.  A sterile dressing was applied.  Intraoperative fluids, EBL, and tourniquet time can be obtained from anesthesia records.  DISPOSITION:  The patient was taken to recovery room in  stable condition and admitted for appropriate post-op care to include peri-operative antibiotic and DVT prophylaxis with mechanical and pharmacologic measures.  Orla Estrin G 09/24/2014, 11:58 AM

## 2014-09-24 NOTE — Anesthesia Procedure Notes (Signed)
Spinal  Start time: 09/24/2014 10:15 AM End time: 09/24/2014 10:20 AM Staffing Performed by: anesthesiologist  Preanesthetic Checklist Completed: patient identified, site marked, surgical consent, pre-op evaluation, timeout performed, IV checked, risks and benefits discussed and monitors and equipment checked Spinal Block Patient position: sitting Prep: Betadine Patient monitoring: heart rate, cardiac monitor, continuous pulse ox and blood pressure Location: L4-5 Needle Needle type: Tuohy  Needle gauge: 22 G Additional Notes 10 cc 0.75% Bupivacaine injected easily

## 2014-09-25 ENCOUNTER — Encounter (HOSPITAL_COMMUNITY): Payer: Self-pay | Admitting: Orthopaedic Surgery

## 2014-09-25 LAB — BASIC METABOLIC PANEL
Anion gap: 6 (ref 5–15)
BUN: 13 mg/dL (ref 6–20)
CO2: 27 mmol/L (ref 22–32)
Calcium: 8.8 mg/dL — ABNORMAL LOW (ref 8.9–10.3)
Chloride: 103 mmol/L (ref 101–111)
Creatinine, Ser: 0.85 mg/dL (ref 0.61–1.24)
GFR calc Af Amer: >60 mL/min (ref >60)
GFR calc non Af Amer: >60 mL/min (ref >60)
Glucose, Bld: 150 mg/dL — ABNORMAL HIGH (ref 65–99)
Potassium: 3.8 mmol/L (ref 3.5–5.1)
Sodium: 136 mmol/L (ref 135–145)

## 2014-09-25 LAB — CBC
HCT: 38 % — ABNORMAL LOW (ref 39.0–52.0)
Hemoglobin: 12.6 g/dL — ABNORMAL LOW (ref 13.0–17.0)
MCH: 32.5 pg (ref 26.0–34.0)
MCHC: 33.2 g/dL (ref 30.0–36.0)
MCV: 97.9 fL (ref 78.0–100.0)
Platelets: 246 10*3/uL (ref 150–400)
RBC: 3.88 MIL/uL — ABNORMAL LOW (ref 4.22–5.81)
RDW: 16 % — ABNORMAL HIGH (ref 11.5–15.5)
WBC: 11.2 10*3/uL — ABNORMAL HIGH (ref 4.0–10.5)

## 2014-09-25 MED ORDER — HYDROMORPHONE HCL 1 MG/ML IJ SOLN
1.0000 mg | INTRAMUSCULAR | Status: AC
  Administered 2014-09-25: 1 mg via INTRAVENOUS

## 2014-09-25 MED ORDER — CETYLPYRIDINIUM CHLORIDE 0.05 % MT LIQD
7.0000 mL | Freq: Two times a day (BID) | OROMUCOSAL | Status: DC
  Administered 2014-09-25 – 2014-09-27 (×5): 7 mL via OROMUCOSAL

## 2014-09-25 NOTE — Evaluation (Signed)
Physical Therapy Evaluation Patient Details Name: Larry Church MRN: [id number] DOB: 1932-05-10 Today's Date: 09/25/2014   History of Present Illness  Pt s/p Lt TKA. PMH - SAH, CAD, back surgery, bil THA  Clinical Impression  Pt admitted with above diagnosis and presents to PT with functional limitations due to deficits listed below (See PT problem list). Pt needs skilled PT to maximize independence and safety to allow discharge to home with wife if pt can progress over next few sessions. If pt doesn't progress may need alternate plan.     Follow Up Recommendations Home health PT;Supervision/Assistance - 24 hour (If pt can progress to a min A level so wife able to assist.)    Equipment Recommendations  None recommended by PT    Recommendations for Other Services       Precautions / Restrictions Precautions Precautions: Fall;Knee Precaution Comments: educated on precautions Required Braces or Orthoses: Knee Immobilizer - Left Knee Immobilizer - Left: Other (comment) (Used to support knee due to poor quad set) Restrictions Weight Bearing Restrictions: Yes LLE Weight Bearing: Weight bearing as tolerated      Mobility  Bed Mobility Overal bed mobility: Needs Assistance Bed Mobility: Supine to Sit     Supine to sit: Min assist     General bed mobility comments: Assist to move LLE and to elevate trunk.  Transfers Overall transfer level: Needs assistance Equipment used: Rolling walker (2 wheeled) Transfers: Sit to/from Stand Sit to Stand: +2 physical assistance;Mod assist         General transfer comment: Verbal cues for hand placement and assist to bring hips up and for balance.  Ambulation/Gait Ambulation/Gait assistance: +2 physical assistance;Min assist;Mod assist Ambulation Distance (Feet): 6 Feet Assistive device: Rolling walker (2 wheeled) Gait Pattern/deviations: Step-to pattern;Decreased step length - right;Decreased step length - left;Decreased stance  time - left;Leaning posteriorly   Gait velocity interpretation: Below normal speed for age/gender General Gait Details: Verbal cues for technique. Assist for support and required knee immobilizer to prevent buckling of Lt knee. Fatigues quickly.  Stairs            Wheelchair Mobility    Modified Rankin (Stroke Patients Only)       Balance Overall balance assessment: Needs assistance Sitting-balance support: No upper extremity supported;Feet supported Sitting balance-[name]: Fair     Standing balance support: Bilateral upper extremity supported Standing balance-[name]: Poor Standing balance comment: Walker and assist for static standing                             Pertinent Vitals/Pain Pain Assessment: Faces Pain Score:  (3-4) Faces Pain Scale: Hurts even more Pain Location: lt knee Pain Intervention(s): Limited activity within patient's tolerance;Repositioned    Home Living Family/patient expects to be discharged to:: Private residence Living Arrangements: Spouse/significant other Available Help at Discharge: Family;Available 24 hours/day Type of Home: House Home Access: Stairs to enter Entrance Stairs-Rails: Left Entrance Stairs-Number of Steps: 4 Home Layout: One level Home Equipment: Cane - single point;Walker - 2 wheels;Toilet riser;Grab bars - toilet;Grab bars - tub/shower      Prior Function Level of Independence: Independent with assistive device(s)               Hand Dominance        Extremity/Trunk Assessment   Upper Extremity Assessment: Defer to OT evaluation           Lower Extremity Assessment: Generalized weakness;LLE deficits/detail  LLE Deficits / Details: Poor quadset. Requires assist to move against gravity.      Communication   Communication: No difficulties  Cognition Arousal/Alertness: Awake/alert Behavior During Therapy: WFL for tasks assessed/performed Overall Cognitive Status: Within  Functional Limits for tasks assessed                      General Comments      Exercises Total Joint Exercises Quad Sets: AAROM;Left;10 reps (poor contraction) Knee Flexion: AAROM;Left;5 reps;Seated Goniometric ROM: 30-90 degrees with assist      Assessment/Plan    PT Assessment Patient needs continued PT services  PT Diagnosis Difficulty walking;Acute pain;Generalized weakness   PT Problem List Decreased strength;Decreased range of motion;Decreased activity tolerance;Decreased balance;Decreased mobility;Decreased knowledge of use of DME;Pain;Obesity  PT Treatment Interventions DME instruction;Gait training;Functional mobility training;Therapeutic activities;Therapeutic exercise;Balance training;Patient/family education   PT Goals (Current goals can be found in the Care Plan section) Acute Rehab PT Goals Patient Stated Goal: Return home PT Goal Formulation: With patient Time For Goal Achievement: 10/02/14 Potential to Achieve Goals: Fair    Frequency 7X/week   Barriers to discharge Decreased caregiver support;Inaccessible home environment Lives with elderly wife and has stairs to get into house    Co-evaluation               End of Session Equipment Utilized During Treatment: Gait belt Activity Tolerance: Patient limited by fatigue Patient left: in chair;with call bell/phone within reach;with chair alarm set;with family/visitor present Nurse Communication: Mobility status         Time: 6283-6629 PT Time Calculation (min) (ACUTE ONLY): 33 min   Charges:   PT Evaluation $Initial PT Evaluation Tier I: 1 Procedure PT Treatments $Gait Training: 8-22 mins   PT G Codes:        Larry Church 10-02-2014, 11:25 AM  Larry Church PT [phone]

## 2014-09-25 NOTE — Progress Notes (Signed)
Subjective: 1 Day Post-Op Procedure(s) (LRB): TOTAL KNEE ARTHROPLASTY (Left)  Activity level:  wbat Diet tolerance:  ok Voiding:  ok Patient reports pain as mild and moderate.    Objective: Vital signs in last 24 hours: Temp:  [97 F (36.1 C)-98.6 F (37 C)] 98.5 F (36.9 C) (08/10 0600) Pulse Rate:  [63-133] 82 (08/10 0600) Resp:  [12-22] 18 (08/10 0600) BP: (99-178)/(57-124) 122/66 mmHg (08/10 0600) SpO2:  [90 %-97 %] 96 % (08/10 0600) Weight:  [107.502 kg (237 lb)-114.5 kg (252 lb 6.8 oz)] 114.5 kg (252 lb 6.8 oz) (08/10 0600)  Labs:  Recent Labs  09/25/14 0537  HGB 12.6*    Recent Labs  09/25/14 0537  WBC 11.2*  RBC 3.88*  HCT 38.0*  PLT 246   No results for input(s): NA, K, CL, CO2, BUN, CREATININE, GLUCOSE, CALCIUM in the last 72 hours. No results for input(s): LABPT, INR in the last 72 hours.  Physical Exam:  Neurologically intact ABD soft Neurovascular intact Sensation intact distally Intact pulses distally Dorsiflexion/Plantar flexion intact Incision: dressing C/D/I and no drainage No cellulitis present Compartment soft  Assessment/Plan:  1 Day Post-Op Procedure(s) (LRB): TOTAL KNEE ARTHROPLASTY (Left) Advance diet Up with therapy Discharge home with home health when doing well and cleared by PT. Maybe tomorrow for Friday. Continue on ASA 325mg  bid x 2 weeks post op. I will change dressing to aquacel tomorrow. I have left this bandage in the room. Follow up in office 2 weeks post op.     Sharel Behne, Larwance Sachs 09/25/2014, 7:59 AM

## 2014-09-25 NOTE — Evaluation (Addendum)
Occupational Therapy Evaluation Patient Details Name: Larry Church MRN: [id number] DOB: 01/24/1933 Today's Date: 09/25/2014    History of Present Illness Pt s/p Lt TKA. PMH - SAH, CAD, back surgery, bil THA   Clinical Impression   Pt s/p above. Pt independent with ADLs, PTA. Feel pt will benefit from acute OT to increase independence prior to d/c. Recommending HHOT upon d/c. Depending on pt's progress, he may benefit from SNF for rehab, prior to d/c home.  Follow Up Recommendations  Home health OT;Supervision/Assistance - 24 hour    Equipment Recommendations  3 in 1 bedside comode; AE  Recommendations for Other Services       Precautions / Restrictions Precautions Precautions: Fall;Knee Precaution Comments: educated on precautions Restrictions Weight Bearing Restrictions: Yes LLE Weight Bearing: Weight bearing as tolerated      Mobility Bed Mobility     General bed mobility comments: not assessed  Transfers Overall transfer level: Needs assistance Equipment used: Rolling walker (2 wheeled) Transfers: Sit to/from Stand Sit to Stand: Mod assist         General transfer comment: Verbal cues for hand placement and assist to bring hips up and for balance.    Balance  Assist to gain balance upon standing and used RW. RW used for a few small steps-Min assist.                                         ADL Overall ADL's : Needs assistance/impaired                     Lower Body Dressing: Moderate-Maximal assistance;Sit to/from stand   Toilet Transfer: Minimal assistance;Ambulation;RW;Moderate assistance (few small steps; Mod assist for sit to stand from chair)   Toileting- Clothing Manipulation and Hygiene: Minimal-moderate assistance;Sit to/from stand Toileting - Clothing Manipulation Details (indicate cue type and reason): pt able to manage gown sitting in chair to try to urinate in urinal; suspect may need assist with underwear/balance  when standing.   Functional mobility during ADLs: Min guard;Rolling walker General ADL Comments: Educated on tub/shower technique (explaining way to get in tub without stepping over). Sounds like pt is planning to use walk in shower, but wife reports 3 in 1 will not fit in there. Educated on LB dressing technique and benefit of reaching to left foot for dressing as it allows knee to bend. Educated that AE is available for LB ADLs.     Vision     Perception     Praxis      Pertinent Vitals/Pain Pain Assessment: 0-10 Pain Score:  (3-4) Pain Location: left knee sitting in chair Pain Intervention(s): Monitored during session;Repositioned  Took pt off of Oxygen for a little while but did place O2 back on patient.      Hand Dominance     Extremity/Trunk Assessment Upper Extremity Assessment Upper Extremity Assessment: Overall WFL for tasks assessed   Lower Extremity Assessment Lower Extremity Assessment: Defer to PT evaluation LLE Deficits / Details: Poor quadset. Requires assist to move against gravity.        Communication Communication Communication: No difficulties   Cognition Arousal/Alertness: Lethargic Behavior During Therapy: WFL for tasks assessed/performed Overall Cognitive Status: Within Functional Limits for tasks assessed                     General Comments  Exercises       Shoulder Instructions      Home Living Family/patient expects to be discharged to:: Private residence Living Arrangements: Spouse/significant other Available Help at Discharge: Family;Available 24 hours/day Type of Home: House Home Access: Stairs to enter [employer] of Steps: 4 Entrance Stairs-Rails: Left Home Layout: One level     Bathroom Shower/Tub: Walk-in shower;Tub/shower unit         Home Equipment: Kasandra Knudsen - single point;Walker - 2 wheels;Toilet riser;Grab bars - toilet;Grab bars - tub/shower          Prior Functioning/Environment Level  of Independence: Independent with assistive device(s)             OT Diagnosis: Acute pain   OT Problem List: Decreased strength;Decreased range of motion;Decreased activity tolerance;Decreased knowledge of use of DME or AE;Decreased knowledge of precautions;Impaired balance (sitting and/or standing);Pain   OT Treatment/Interventions: Self-care/ADL training;DME and/or AE instruction;Therapeutic activities;Patient/family education;Balance training    OT Goals(Current goals can be found in the care plan section) Acute Rehab OT Goals Patient Stated Goal: not stated OT Goal Formulation: With patient Time For Goal Achievement: 10/02/14 Potential to Achieve Goals: Good ADL Goals Pt Will Perform Lower Body Bathing: with min guard assist;sit to/from stand;with adaptive equipment Pt Will Perform Lower Body Dressing: with min guard assist;with adaptive equipment;sit to/from stand Pt Will Transfer to Toilet: with min guard assist;ambulating Pt Will Perform Toileting - Clothing Manipulation and hygiene: with min guard assist;sit to/from stand  OT Frequency: Min 2X/week   Barriers to D/C:            Co-evaluation              End of Session Equipment Utilized During Treatment: Gait belt;Rolling walker;Left knee immobilizer; Oxygen CPM Left Knee CPM Left Knee: Off  Activity Tolerance: Patient limited by lethargy Patient left: in chair;with call bell/phone within reach;with chair alarm set;with family/visitor present  Nurse Communication: Mobility status   Time: 1030-1050 OT Time Calculation (min): 20 min Charges:  OT General Charges $OT Visit: 1 Procedure OT Evaluation $Initial OT Evaluation Tier I: 1 Procedure G-CodesBenito Mccreedy OTR/L [phone] 09/25/2014, 11:04 AM

## 2014-09-25 NOTE — Progress Notes (Signed)
Physical Therapy Treatment Patient Details Name: Larry Church MRN: [id number] DOB: December 05, 1932 Today's Date: 09/25/2014    History of Present Illness Pt s/p Lt TKA. PMH - SAH, CAD, back surgery, bil THA    PT Comments    POD #1 second session with increased gait distance, however, still requiring mod assist to support trunk for both balance and support over operative knee.  Pt very fatigued after short distance gait.  Pt may need a longer acute stay vs. SNF for rehab if he is not progressing quickly tomorrow.    Follow Up Recommendations  Home health PT;Supervision/Assistance - 24 hour     Equipment Recommendations  None recommended by PT    Recommendations for Other Services   NA     Precautions / Restrictions Precautions Precautions: Fall;Knee Required Braces or Orthoses: Knee Immobilizer - Left Knee Immobilizer - Left: Other (comment) (used due to unstable knee, decreased quad control) Restrictions LLE Weight Bearing: Weight bearing as tolerated    Mobility  Bed Mobility Overal bed mobility: Needs Assistance Bed Mobility: Supine to Sit;Sit to Supine     Supine to sit: Min assist Sit to supine: Min assist   General bed mobility comments: Min assist to help progress left leg into and out of bed.   Transfers Overall transfer level: Needs assistance Equipment used: Rolling walker (2 wheeled) Transfers: Sit to/from Stand Sit to Stand: Mod assist         General transfer comment: mod assist to support trunk to get to standing.   Ambulation/Gait Ambulation/Gait assistance: Mod assist Ambulation Distance (Feet): 20 Feet Assistive device: Rolling walker (2 wheeled) Gait Pattern/deviations: Step-to pattern;Antalgic;Trunk flexed Gait velocity: decreased Gait velocity interpretation: Below normal speed for age/gender General Gait Details: antalgic gait pattern, verbal cues for RW proximity and correct LE sequencing.  Mod assist needed at trunk for balance and  support when WB on left leg.  Limited gait distance due to endurance issues and pain.           Balance Overall balance assessment: Needs assistance         Standing balance support: Bilateral upper extremity supported Standing balance-[name]: Poor                      Cognition Arousal/Alertness: Awake/alert Behavior During Therapy: WFL for tasks assessed/performed Overall Cognitive Status: Within Functional Limits for tasks assessed                      Exercises Total Joint Exercises Ankle Circles/Pumps: AROM;Both;20 reps;Supine Quad Sets: AROM;Left;10 reps;Supine Heel Slides: AAROM;Left;10 reps;Supine        Pertinent Vitals/Pain Pain Assessment: 0-10 Pain Score: 5  Pain Location: left knee Pain Descriptors / Indicators: Aching Pain Intervention(s): Limited activity within patient's tolerance;Monitored during session;Premedicated before session;Repositioned           PT Goals (current goals can now be found in the care plan section) Acute Rehab PT Goals Patient Stated Goal: Return home Progress towards PT goals: Progressing toward goals    Frequency  BID    PT Plan Current plan remains appropriate       End of Session Equipment Utilized During Treatment: Gait belt;Left knee immobilizer Activity Tolerance: Patient limited by fatigue Patient left: in bed;in CPM;with call bell/phone within reach     Time: 1422-1449 PT Time Calculation (min) (ACUTE ONLY): 27 min  Charges:  $Gait Training: 8-22 mins $Therapeutic Exercise: 8-22 mins  Barbarann Ehlers [place], [place], DPT [phone]   09/25/2014, 3:48 PM

## 2014-09-26 LAB — CBC
HCT: 35.8 % — ABNORMAL LOW (ref 39.0–52.0)
Hemoglobin: 12 g/dL — ABNORMAL LOW (ref 13.0–17.0)
MCH: 32.5 pg (ref 26.0–34.0)
MCHC: 33.5 g/dL (ref 30.0–36.0)
MCV: 97 fL (ref 78.0–100.0)
Platelets: 223 10*3/uL (ref 150–400)
RBC: 3.69 MIL/uL — ABNORMAL LOW (ref 4.22–5.81)
RDW: 15.7 % — ABNORMAL HIGH (ref 11.5–15.5)
WBC: 14.6 10*3/uL — ABNORMAL HIGH (ref 4.0–10.5)

## 2014-09-26 MED ORDER — BISACODYL 10 MG RE SUPP: 10.0000 mg | Freq: Every day | RECTAL | Status: DC | PRN

## 2014-09-26 NOTE — Progress Notes (Signed)
Subjective: 2 Days Post-Op Procedure(s) (LRB): TOTAL KNEE ARTHROPLASTY (Left)  Activity level:  wbat Diet tolerance:  ok Voiding:  ok Patient reports pain as mild.    Objective: Vital signs in last 24 hours: Temp:  [98.2 F (36.8 C)-98.4 F (36.9 C)] 98.2 F (36.8 C) (08/11 0152) Pulse Rate:  [36-89] 87 (08/11 0152) Resp:  [18-20] 20 (08/11 0152) BP: (115-124)/(61-70) 115/63 mmHg (08/11 0152) SpO2:  [92 %-98 %] 92 % (08/11 0152)  Labs:  Recent Labs  09/25/14 0537 09/26/14 0607  HGB 12.6* 12.0*    Recent Labs  09/25/14 0537 09/26/14 0607  WBC 11.2* 14.6*  RBC 3.88* 3.69*  HCT 38.0* 35.8*  PLT 246 223    Recent Labs  09/25/14 0537  NA 136  K 3.8  CL 103  CO2 27  BUN 13  CREATININE 0.85  GLUCOSE 150*  CALCIUM 8.8*   No results for input(s): LABPT, INR in the last 72 hours.  Physical Exam:  Neurologically intact ABD soft Neurovascular intact Sensation intact distally Intact pulses distally Dorsiflexion/Plantar flexion intact Incision: dressing C/D/I and no drainage No cellulitis present Compartment soft  Assessment/Plan:  2 Days Post-Op Procedure(s) (LRB): TOTAL KNEE ARTHROPLASTY (Left) Advance diet Up with therapy Plan for discharge tomorrow Discharge home with home health if continuing to do well and cleared by PT. Dressing changed to aquacel today. Continue on ASA 325mg  BID x 2 weeks post op. Follow up in office 2 weeks post op.   Larry Church, Larry Church 09/26/2014, 11:51 AM

## 2014-09-26 NOTE — Progress Notes (Signed)
RN and Marc Morgans, RN Hydrographic surveyor) on dayshift took pt out of CPM for tonight. Wife stated that pt had been in CPM for a while now. Placed CPM on floor near bed. Will continue to monitor pt. Ranelle Oyster, RN

## 2014-09-26 NOTE — Progress Notes (Signed)
Physical Therapy Treatment Patient Details Name: Larry Church MRN: [id number] DOB: 30-Dec-1932 Today's Date: 09/26/2014    History of Present Illness Pt s/p Lt TKA. PMH - SAH, CAD, back surgery, bil THA    PT Comments    Pt is POD #2 and is progressing slowly, but steadily.  Wife is present for this AM session and is wondering if she can handle the patient at home.  Pt is insistent on returning home at discharge and not going to SNF.  PT requested to MD in room during session at least one more BID day acutely to get pt stronger and more independent.  I plan on having wife assist with his mobility during tomorrow's AM session to make sure she feels like she can handle him at home.   Follow Up Recommendations  Home health PT;Supervision/Assistance - 24 hour     Equipment Recommendations  None recommended by PT    Recommendations for Other Services   NA     Precautions / Restrictions Precautions Precautions: Fall;Knee Precaution Booklet Issued: Yes (comment) Precaution Comments: educated on precautions Required Braces or Orthoses: Knee Immobilizer - Left Restrictions LLE Weight Bearing: Weight bearing as tolerated    Mobility  Bed Mobility                  Transfers Overall transfer level: Needs assistance Equipment used: Rolling walker (2 wheeled) Transfers: Sit to/from Stand Sit to Stand: Min assist         General transfer comment: Min assist to support trunk during transitions.   Ambulation/Gait Ambulation/Gait assistance: Mod assist;+2 physical assistance;Min assist Ambulation Distance (Feet): 40 Feet Assistive device: Rolling walker (2 wheeled) Gait Pattern/deviations: Step-to pattern;Antalgic;Trunk flexed Gait velocity: decreased Gait velocity interpretation: Below normal speed for age/gender General Gait Details: verbal cues for upright posture, correct LE sequencing and proximity to RW during gait. Started out as min assist, but as pt fatigued he  needed mod assist.  Wife following with chair to try to increase gait distance and safety.           Balance Overall balance assessment: Needs assistance         Standing balance support: Bilateral upper extremity supported Standing balance-[name]: Poor                      Cognition Arousal/Alertness: Awake/alert Behavior During Therapy: WFL for tasks assessed/performed Overall Cognitive Status: Within Functional Limits for tasks assessed                      Exercises Total Joint Exercises Ankle Circles/Pumps: AROM;Both;10 reps;Supine Quad Sets: AROM;Left;10 reps;Seated Towel Squeeze: AROM;Both;10 reps;Seated Heel Slides: AAROM;Left;10 reps;Seated        Pertinent Vitals/Pain Pain Assessment: 0-10 Pain Score: 4  Pain Location: left knee Pain Descriptors / Indicators: Aching;Burning Pain Intervention(s): Limited activity within patient's tolerance;Monitored during session;Premedicated before session;Repositioned           PT Goals (current goals can now be found in the care plan section) Acute Rehab PT Goals Patient Stated Goal: Return home Progress towards PT goals: Progressing toward goals    Frequency  BID    PT Plan Current plan remains appropriate       End of Session Equipment Utilized During Treatment: Left knee immobilizer Activity Tolerance: Patient limited by fatigue;Patient limited by pain Patient left: in chair;with call bell/phone within reach;with family/visitor present;with chair alarm set     Time: 5465-0354 PT Time  Calculation (min) (ACUTE ONLY): 45 min  Charges:  $Gait Training: 23-37 mins $Therapeutic Exercise: 8-22 mins                      Larry Church B. [place], [place], DPT [phone]   09/26/2014, 12:27 PM

## 2014-09-26 NOTE — Care Management Note (Addendum)
Case Management Note  Patient Details  Name: JERAD DUNLAP MRN: [id number] Date of Birth: 09/26/1932  Subjective/Objective:   Patient has been set up with [facility] for HHPT by MD office, he has his DME as well, rolling walker and bsc thru [employer] and they will deliver the CPM machine to patient's home at dc.  NCM spoke with patient and his wife to make sure the plan was to go home with [facility] , they both said yes this is the plan, NCM informed CSW as well.                    Action/Plan:   Expected Discharge Date:                  Expected Discharge Plan:  [facility]  In-House Referral:     Discharge planning Services  CM Consult  Post Acute Care Choice:  Home Health Choice offered to:  Patient  DME Arranged:    DME Agency:     HH Arranged:  PT, OT, aide [facility] Agency:  [facility]  Status of Service:  Completed, signed off  Medicare Important Message Given:    Date Medicare IM Given:    Medicare IM give by:    Date Additional Medicare IM Given:    Additional Medicare Important Message give by:     If discussed at [place] of Stay Meetings, dates discussed:    Additional Comments:  Zenon Mayo, RN 09/26/2014, 2:32 PM

## 2014-09-26 NOTE — Progress Notes (Signed)
Physical Therapy Treatment Patient Details Name: Larry Church MRN: [id number] DOB: 1932-04-24 Today's Date: 09/26/2014    History of Present Illness Pt s/p Lt TKA. PMH - SAH, CAD, back surgery, bil THA    PT Comments    Pt is POD #2 (PM session) and has been able to significantly increase his gait distance. He still needs frequent verbal cues for safe use of RW.  We attempted stair training with two person mod assist and it was quite difficult (and this was with the patient using a railing).  He reports he will have two "strong men" helping him get into the house at home.  PT will want to see pt BID tomorrow before d/c home to ensure safety on stairs for home entry and to see wife physically assist pt with mobility to make sure she can handle him safely.   Follow Up Recommendations  Home health PT;Supervision/Assistance - 24 hour     Equipment Recommendations  None recommended by PT    Recommendations for Other Services    NA    Precautions / Restrictions Precautions Precautions: Fall;Knee Required Braces or Orthoses: Knee Immobilizer - Left Restrictions LLE Weight Bearing: Weight bearing as tolerated    Mobility  Bed Mobility Overal bed mobility: Needs Assistance Bed Mobility: Sit to Supine       Sit to supine: Min assist   General bed mobility comments: Min assist of left leg to get back into bed.  Transfers Overall transfer level: Needs assistance Equipment used: Rolling walker (2 wheeled) Transfers: Sit to/from Stand Sit to Stand: Min assist;Mod assist         General transfer comment: Min assist to support trunk to get to standing. Up to mod assit from recliner once fatigued.    Ambulation/Gait Ambulation/Gait assistance: Min assist;+2 safety/equipment (chair to follow back from stairwell) Ambulation Distance (Feet): 200 Feet Assistive device: Rolling walker (2 wheeled) Gait Pattern/deviations: Step-to pattern;Antalgic;Trunk flexed Gait velocity:  decreased Gait velocity interpretation: Below normal speed for age/gender General Gait Details: Verbal cues for upright posture, and proximity to RW.    Stairs Stairs: Yes Stairs assistance: Mod assist (+2 mod assist) Stair Management: One rail Right;Step to pattern;Forwards Number of Stairs: 3 General stair comments: Pt with heavy reliance on hands for support.  I was not ready to try reverse with RW because he doesn't have enough balance and it was just me and his family helping (I would like it to be me and a tech for the first time), he will have his son and one of his workers helping him at home to get in the house as the three steps on the front do not have a railing.       Balance Overall balance assessment: Needs assistance Sitting-balance support: Feet supported;No upper extremity supported Sitting balance-[name]: Fair     Standing balance support: Bilateral upper extremity supported Standing balance-[name]: Poor                      Cognition Arousal/Alertness: Awake/alert Behavior During Therapy: WFL for tasks assessed/performed Overall Cognitive Status: Within Functional Limits for tasks assessed                             Pertinent Vitals/Pain Pain Assessment: 0-10 Pain Score: 3  Pain Location: left knee Pain Descriptors / Indicators: Aching Pain Intervention(s): Limited activity within patient's tolerance;Monitored during session;Repositioned;Premedicated before session  PT Goals (current goals can now be found in the care plan section) Acute Rehab PT Goals Patient Stated Goal: Return home Progress towards PT goals: Progressing toward goals    Frequency  BID    PT Plan Current plan remains appropriate       End of Session Equipment Utilized During Treatment: Left knee immobilizer Activity Tolerance: Patient limited by fatigue;Patient limited by pain Patient left: in CPM;in bed;with call bell/phone within  reach;with bed alarm set;with family/visitor present     Time: 6861-6837 PT Time Calculation (min) (ACUTE ONLY): 32 min  Charges:  $Gait Training: 23-37 mins                      Rane Blitch B. [place], [place], DPT [phone]   09/26/2014, 4:57 PM

## 2014-09-27 LAB — CBC
HCT: 32.4 % — ABNORMAL LOW (ref 39.0–52.0)
Hemoglobin: 11.1 g/dL — ABNORMAL LOW (ref 13.0–17.0)
MCH: 32.8 pg (ref 26.0–34.0)
MCHC: 34.3 g/dL (ref 30.0–36.0)
MCV: 95.9 fL (ref 78.0–100.0)
Platelets: 241 10*3/uL (ref 150–400)
RBC: 3.38 MIL/uL — ABNORMAL LOW (ref 4.22–5.81)
RDW: 15.6 % — ABNORMAL HIGH (ref 11.5–15.5)
WBC: 14.3 10*3/uL — ABNORMAL HIGH (ref 4.0–10.5)

## 2014-09-27 MED ORDER — HYDROCODONE-ACETAMINOPHEN 5-325 MG PO TABS: 1.0000 | ORAL_TABLET | ORAL | Status: DC | PRN

## 2014-09-27 MED ORDER — ASPIRIN 325 MG PO TBEC: 325.0000 mg | DELAYED_RELEASE_TABLET | Freq: Two times a day (BID) | ORAL | Status: DC

## 2014-09-27 MED ORDER — METHOCARBAMOL 500 MG PO TABS: 500.0000 mg | ORAL_TABLET | Freq: Four times a day (QID) | ORAL | Status: DC | PRN

## 2014-09-27 NOTE — Progress Notes (Signed)
Physical Therapy Treatment Patient Details Name: Larry Church MRN: [id number] DOB: 30-Aug-1932 Today's Date: 09/27/2014    History of Present Illness Pt s/p Lt TKA. PMH - SAH, CAD, back surgery, bil THA    PT Comments    Pt is POD #3 and is moving much better. He gets better with every practice session. Wife provided assistance (until we got to do steps) for husband for gait and getting up and down out of the recliner chair.  Discussed the importance of chair choice at home. Also advised pt to remove all throw rugs from the house (except for the one in the bathroom).  PT to see one more session today before d/c home.  HEP handout provided.   Follow Up Recommendations  Home health PT;Supervision/Assistance - 24 hour     Equipment Recommendations  None recommended by PT    Recommendations for Other Services   NA     Precautions / Restrictions Precautions Precautions: Fall;Knee Precaution Booklet Issued: Yes (comment) Required Braces or Orthoses: Knee Immobilizer - Left Restrictions LLE Weight Bearing: Weight bearing as tolerated    Mobility       Transfers Overall transfer level: Needs assistance Equipment used: Rolling walker (2 wheeled) Transfers: Sit to/from Stand Sit to Stand: Min assist         General transfer comment: Momentum, verbal cues for safe hand placement and multiple attempts.  Assist provided by wife.   Ambulation/Gait Ambulation/Gait assistance: Min guard Ambulation Distance (Feet): 20 Feet Assistive device: Rolling walker (2 wheeled) Gait Pattern/deviations: Step-to pattern;Antalgic;Trunk flexed Gait velocity: decreased Gait velocity interpretation: Below normal speed for age/gender General Gait Details: verbal cues for upright posture and safe proximity to RW.    Stairs Stairs: Yes Stairs assistance: +2 safety/equipment (min assist) Stair Management: One rail Right;Forwards;Step to pattern Number of Stairs: 3 General stair comments: Pt  with heavy reliance on bil hands for support reviewed correct LE sequencing with pt.  Wife did not feel comfortable with going backwards with RW once demonstrated by PT.          Balance Overall balance assessment: Needs assistance         Standing balance support: Bilateral upper extremity supported Standing balance-[name]: Poor                      Cognition Arousal/Alertness: Awake/alert Behavior During Therapy: WFL for tasks assessed/performed Overall Cognitive Status: Within Functional Limits for tasks assessed                      Exercises Total Joint Exercises Heel Slides: AROM;AAROM;10 reps;Seated;Left Long Arc Quad: AROM;Left;10 reps;Seated        Pertinent Vitals/Pain Pain Assessment: 0-10 Pain Score: 4  Pain Location: left knee Pain Descriptors / Indicators: Aching Pain Intervention(s): Limited activity within patient's tolerance;Monitored during session;Premedicated before session;Repositioned           PT Goals (current goals can now be found in the care plan section) Acute Rehab PT Goals Patient Stated Goal: Return home Progress towards PT goals: Progressing toward goals    Frequency  BID    PT Plan Current plan remains appropriate       End of Session Equipment Utilized During Treatment: Left knee immobilizer Activity Tolerance: Patient limited by fatigue;Patient limited by pain Patient left: in chair;with call bell/phone within reach;with family/visitor present     Time: 1975-8832 PT Time Calculation (min) (ACUTE ONLY): 28 min  Charges:  $Gait  Training: 8-22 mins $Therapeutic Exercise: 8-22 mins                     Aero Drummonds B. [place], PT, DPT [phone]   09/27/2014, 1:48 PM

## 2014-09-27 NOTE — Progress Notes (Signed)
Physical Therapy Treatment Patient Details Name: Larry Church MRN: [id number] DOB: [birth year] Today's Date: 09/27/2014    History of Present Illness Pt s/p Lt TKA. PMH - SAH, CAD, back surgery, bil THA    PT Comments    Pt is POD #3 (PM session) and continues to progress with independence, requiring less assistance every session. Reviewed HEP papers with wife and all questions answered. Pt is due to d/c home after this afternoon's session.  PT will continue to follow acutely until d/c confirmed.  Wife again provided all assistance to pt during this session with PT giving cues and supervising for safety.   Follow Up Recommendations  Home health PT;Supervision/Assistance - 24 hour     Equipment Recommendations  None recommended by PT    Recommendations for Other Services   NA     Precautions / Restrictions Precautions Precautions: Fall;Knee Precaution Booklet Issued: Yes (comment) Required Braces or Orthoses: Knee Immobilizer - Left Restrictions LLE Weight Bearing: Weight bearing as tolerated    Mobility   Transfers Overall transfer level: Needs assistance Equipment used: Rolling walker (2 wheeled) Transfers: Sit to/from Stand Sit to Stand: Min guard         General transfer comment: assist provided by wife.  Pt stood on first attempt using momentum with correct/safe hand placement.   Ambulation/Gait Ambulation/Gait assistance: Min guard Ambulation Distance (Feet): 100 Feet Assistive device: Rolling walker (2 wheeled) Gait Pattern/deviations: Step-to pattern;Antalgic;Trunk flexed Gait velocity: decreased Gait velocity interpretation: Below normal speed for age/gender General Gait Details: verbal cues for upright posture and safe proximity to RW.    Stairs Stairs: Yes Stairs assistance: +2 safety/equipment (min assist) Stair Management: One rail Right;Forwards;Step to pattern Number of Stairs: 3 General stair comments: Pt with heavy reliance on bil hands for  support reviewed correct LE sequencing with pt.  Wife did not feel comfortable with going backwards with RW once demonstrated by PT.          Balance Overall balance assessment: Needs assistance         Standing balance support: Bilateral upper extremity supported Standing balance-[name]: Poor                      Cognition Arousal/Alertness: Awake/alert Behavior During Therapy: WFL for tasks assessed/performed Overall Cognitive Status: Within Functional Limits for tasks assessed                      Exercises Total Joint Exercises Ankle Circles/Pumps: AROM;Both;10 reps;Supine Quad Sets: AROM;Left;10 reps;Seated Towel Squeeze: AROM;Both;10 reps;Seated Heel Slides: AAROM;Left;10 reps;Seated Long Arc Quad: AROM;Left;10 reps;Seated        Pertinent Vitals/Pain Pain Assessment: 0-10 Pain Score: 3  Pain Location: left knee Pain Descriptors / Indicators: Aching Pain Intervention(s): Limited activity within patient's tolerance           PT Goals (current goals can now be found in the care plan section) Acute Rehab PT Goals Patient Stated Goal: Return home Progress towards PT goals: Progressing toward goals    Frequency  BID    PT Plan Current plan remains appropriate       End of Session Equipment Utilized During Treatment: Left knee immobilizer Activity Tolerance: Patient limited by fatigue;Patient limited by pain Patient left: in chair;with call bell/phone within reach;with family/visitor present     Time: 5784-6962 PT Time Calculation (min) (ACUTE ONLY): 22 min  Charges:  $Gait Training: 8-22 mins $Therapeutic Exercise: 8-22 mins  Barbarann Ehlers [place], [place], DPT [phone]   09/27/2014, 1:54 PM
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
## 2014-09-27 NOTE — Care Management Note (Addendum)
Case Management Note  Patient Details  Name: NICHOLLAS PERUSSE MRN: [id number] Date of Birth: 1932-09-18  Subjective/Objective:  Patient is for dc today, [facility] with [facility] notified and [facility] with T[facility].                   Action/Plan:   Expected Discharge Date:                  Expected Discharge Plan:  [place]  In-House Referral:     Discharge planning Services  CM Consult  Post Acute Care Choice:  Home Health Choice offered to:  Patient  DME Arranged:    DME Agency:     HH Arranged:  PT, OT, Nurse's Aide HH Agency:  [place]  Status of Service:  Completed, signed off  Medicare Important Message Given:    Date Medicare IM Given:    Medicare IM give by:    Date Additional Medicare IM Given:    Additional Medicare Important Message give by:     If discussed at [place] of Stay Meetings, dates discussed:    Additional Comments:  Zenon Mayo, RN 09/27/2014, 11:06 AM

## 2014-09-27 NOTE — Care Management Important Message (Signed)
Important Message  Patient Details  Name: Larry Church MRN: [id number] Date of Birth: 1932-09-15   Medicare Important Message Given:  Yes-second notification given    Nathen May 09/27/2014, 12:39 [place] Message  Patient Details  Name: Larry Church MRN: [id number] Date of Birth: 02-27-1932   Medicare Important Message Given:  Yes-second notification given    Nathen May 09/27/2014, 12:39 PM

## 2014-09-27 NOTE — Patient Instructions (Signed)
Ankle Pumps    Point toes down, then up. Repeat 10 times.  Do 2 sessions each day.     Knee Presses  Tighten top of left thigh. Hold for 3 seconds. Relax for 3 seconds. Repeat 10 times.  Repeat with other leg.  Do 2 sessions each day  Towel Squeezes: Next, put a towel between your knees and do knee presses while also squeezing the towel.  Hold for 3 seconds.  Relax for 3 seconds.  Repeat 10 times.  Do 2 sessions each day.        Heel Slides   Slide left heel along bed towards bottom. Hold for 3 seconds. Slide back to flat knee position. Repeat 10 times. Do 2 sessions each day.   Short Kicks   Place pillow or towel roll under knees. Keep your thigh on the roll and lift left foot until leg is straight.  Hold for 3 seconds.  Repeat 10 times.  Do 2 sessions each day.    Straight Leg Raise  Lie on your back with your "good" knee bent and foot flat.  Slowly lift left leg 6 inches off the bed.  It is important to keep your leg as straight as possible and your toes pointed up. Repeat 10 times.  Do 2 sessions each day.    Leg out to the side   Lie on your back.  Slide your left leg out to the side and then pull it back to the center.  Keep your toes pointed toward the ceiling.  Repeat 10 times.  Do 2 sessions each day.             SITTING EXERCISES     Knee Bending    Sit on a firm seat, foot on towel or pillowcase. Bend your left knee by pulling your heel under the seat as far as possible.  Hold for 10 seconds.  Relax for 3 seconds.  Repeat 10 times.  Do 2 sessions each day.  Next: Do the same exercise as above and use your "good" leg to help slide your heel under the seat.  Keep your hips on the chair.  Hold for 10 seconds.  Relax for 3 seconds.   Repeat 10 times.  Do 2 sessions each day.                Long Kicks   Sit on a firm seat and slowly kick your left foot up.  It is important to get your knee straight.  Hold for 3 seconds.   Relax for 3 seconds.  Repeat 10 times.  Do 2 sessions each day.   Copyright  [employer]. All rights reserved.

## 2014-09-27 NOTE — Progress Notes (Signed)
Patient was discharged home by MD order; discharged instructions  review and give to patient and his wife with care notes and prescriptions; IV DIC; skin intact; patient will be escorted to the car by volunteer via wheelchair.  

## 2014-10-15 ENCOUNTER — Encounter: Payer: Self-pay | Admitting: Physical Therapy

## 2014-10-15 ENCOUNTER — Ambulatory Visit: Payer: Medicare Other | Attending: Orthopaedic Surgery | Admitting: Physical Therapy

## 2014-10-15 DIAGNOSIS — R262 Difficulty in walking, not elsewhere classified: Secondary | ICD-10-CM | POA: Diagnosis present

## 2014-10-15 DIAGNOSIS — M25562 Pain in left knee: Secondary | ICD-10-CM | POA: Diagnosis present

## 2014-10-15 DIAGNOSIS — M25662 Stiffness of left knee, not elsewhere classified: Secondary | ICD-10-CM
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
## 2014-10-15 NOTE — Patient Instructions (Signed)
Quad Set   Slowly tighten thigh muscles of straight leg hold 2 seconds. Relax. Repeat _15___ times. Do __3__ sessions per day.  Short [employer] a large can or rolled towel under leg. Straighten knee and leg. Hold _3___ seconds. Repeat with other leg. Repeat _15_ times. Do _3___ sessions per day.    With towel around left heel, gently pull knee up with towel until stretch is felt. Hold _10__ seconds.  Repeat _10___ times per set. Do __2__ sets per session. Do __3__ sessions per day.

## 2014-10-17 ENCOUNTER — Ambulatory Visit: Payer: Medicare Other | Attending: Orthopaedic Surgery | Admitting: Physical Therapy

## 2014-10-17 ENCOUNTER — Encounter: Payer: Self-pay | Admitting: Physical Therapy

## 2014-10-17 DIAGNOSIS — R262 Difficulty in walking, not elsewhere classified: Secondary | ICD-10-CM | POA: Diagnosis present

## 2014-10-17 DIAGNOSIS — M25662 Stiffness of left knee, not elsewhere classified: Secondary | ICD-10-CM | POA: Diagnosis not present

## 2014-10-17 DIAGNOSIS — R269 Unspecified abnormalities of gait and mobility: Secondary | ICD-10-CM | POA: Diagnosis present

## 2014-10-17 DIAGNOSIS — M25562 Pain in left knee: Secondary | ICD-10-CM | POA: Diagnosis present
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
## 2014-10-22 ENCOUNTER — Encounter: Payer: Self-pay | Admitting: Physical Therapy

## 2014-10-22 ENCOUNTER — Ambulatory Visit: Payer: Medicare Other | Admitting: Physical Therapy

## 2014-10-22 DIAGNOSIS — M25662 Stiffness of left knee, not elsewhere classified: Secondary | ICD-10-CM | POA: Diagnosis not present
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
## 2014-10-24 ENCOUNTER — Ambulatory Visit: Payer: Medicare Other | Admitting: Physical Therapy

## 2014-10-24 ENCOUNTER — Encounter: Payer: Self-pay | Admitting: Physical Therapy

## 2014-10-24 DIAGNOSIS — R262 Difficulty in walking, not elsewhere classified: Secondary | ICD-10-CM

## 2014-10-24 DIAGNOSIS — M25562 Pain in left knee: Secondary | ICD-10-CM

## 2014-10-24 DIAGNOSIS — M25662 Stiffness of left knee, not elsewhere classified: Secondary | ICD-10-CM | POA: Diagnosis not present
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
## 2014-10-29 ENCOUNTER — Ambulatory Visit: Payer: Medicare Other | Admitting: Physical Therapy

## 2014-10-29 ENCOUNTER — Encounter: Payer: Self-pay | Admitting: Physical Therapy

## 2014-10-29 DIAGNOSIS — M25562 Pain in left knee: Secondary | ICD-10-CM

## 2014-10-29 DIAGNOSIS — M25662 Stiffness of left knee, not elsewhere classified: Secondary | ICD-10-CM
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
## 2014-10-31 ENCOUNTER — Ambulatory Visit: Payer: Medicare Other | Admitting: Physical Therapy

## 2014-10-31 ENCOUNTER — Encounter: Payer: Self-pay | Admitting: Physical Therapy

## 2014-10-31 DIAGNOSIS — M25562 Pain in left knee: Secondary | ICD-10-CM

## 2014-10-31 DIAGNOSIS — M25662 Stiffness of left knee, not elsewhere classified: Secondary | ICD-10-CM

## 2014-10-31 DIAGNOSIS — R262 Difficulty in walking, not elsewhere classified: Secondary | ICD-10-CM
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
## 2014-11-04 ENCOUNTER — Encounter: Payer: Medicare Other | Admitting: Physical Therapy

## 2014-11-05 ENCOUNTER — Other Ambulatory Visit: Payer: Self-pay | Admitting: Internal Medicine

## 2014-11-05 ENCOUNTER — Ambulatory Visit: Payer: Medicare Other | Admitting: Physical Therapy

## 2014-11-05 DIAGNOSIS — R262 Difficulty in walking, not elsewhere classified: Secondary | ICD-10-CM

## 2014-11-05 DIAGNOSIS — M25662 Stiffness of left knee, not elsewhere classified: Secondary | ICD-10-CM
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
## 2014-11-06 ENCOUNTER — Encounter: Payer: Medicare Other | Admitting: Physical Therapy

## 2014-11-07 ENCOUNTER — Ambulatory Visit: Payer: Medicare Other | Admitting: Physical Therapy

## 2014-11-07 ENCOUNTER — Encounter: Payer: Self-pay | Admitting: Physical Therapy

## 2014-11-07 DIAGNOSIS — M25662 Stiffness of left knee, not elsewhere classified: Secondary | ICD-10-CM

## 2014-11-07 DIAGNOSIS — M25562 Pain in left knee: Secondary | ICD-10-CM
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
## 2014-11-08 ENCOUNTER — Encounter: Payer: Medicare Other | Admitting: Physical Therapy

## 2014-11-12 ENCOUNTER — Ambulatory Visit: Payer: Medicare Other | Admitting: Physical Therapy

## 2014-11-12 ENCOUNTER — Encounter: Payer: Self-pay | Admitting: Physical Therapy

## 2014-11-12 DIAGNOSIS — M25562 Pain in left knee: Secondary | ICD-10-CM

## 2014-11-12 DIAGNOSIS — M25662 Stiffness of left knee, not elsewhere classified: Secondary | ICD-10-CM | POA: Diagnosis not present

## 2014-11-12 DIAGNOSIS — R262 Difficulty in walking, not elsewhere classified: Secondary | ICD-10-CM
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
## 2014-11-14 ENCOUNTER — Ambulatory Visit: Payer: Medicare Other | Admitting: Physical Therapy

## 2014-11-14 ENCOUNTER — Encounter: Payer: Self-pay | Admitting: Physical Therapy

## 2014-11-14 DIAGNOSIS — R269 Unspecified abnormalities of gait and mobility: Secondary | ICD-10-CM

## 2014-11-14 DIAGNOSIS — M25662 Stiffness of left knee, not elsewhere classified: Secondary | ICD-10-CM | POA: Diagnosis not present

## 2014-11-14 DIAGNOSIS — R262 Difficulty in walking, not elsewhere classified: Secondary | ICD-10-CM
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
## 2014-11-19 ENCOUNTER — Ambulatory Visit: Payer: Medicare Other | Attending: Orthopaedic Surgery | Admitting: Physical Therapy

## 2014-11-19 ENCOUNTER — Encounter: Payer: Self-pay | Admitting: Physical Therapy

## 2014-11-19 DIAGNOSIS — R262 Difficulty in walking, not elsewhere classified: Secondary | ICD-10-CM | POA: Diagnosis present

## 2014-11-19 DIAGNOSIS — M25562 Pain in left knee: Secondary | ICD-10-CM | POA: Diagnosis present

## 2014-11-19 DIAGNOSIS — R269 Unspecified abnormalities of gait and mobility: Secondary | ICD-10-CM | POA: Insufficient documentation

## 2014-11-19 DIAGNOSIS — M25662 Stiffness of left knee, not elsewhere classified: Secondary | ICD-10-CM | POA: Insufficient documentation
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
## 2014-11-21 ENCOUNTER — Ambulatory Visit: Payer: Medicare Other | Admitting: Physical Therapy

## 2014-11-21 DIAGNOSIS — M25562 Pain in left knee: Secondary | ICD-10-CM

## 2014-11-21 DIAGNOSIS — R262 Difficulty in walking, not elsewhere classified: Secondary | ICD-10-CM

## 2014-11-21 DIAGNOSIS — M25662 Stiffness of left knee, not elsewhere classified: Secondary | ICD-10-CM
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
## 2014-11-22 ENCOUNTER — Ambulatory Visit: Payer: Medicare Other | Admitting: Internal Medicine

## 2014-11-25 ENCOUNTER — Ambulatory Visit (INDEPENDENT_AMBULATORY_CARE_PROVIDER_SITE_OTHER): Payer: Medicare Other

## 2014-11-25 ENCOUNTER — Other Ambulatory Visit: Payer: Self-pay | Admitting: Allergy

## 2014-11-25 DIAGNOSIS — J309 Allergic rhinitis, unspecified: Secondary | ICD-10-CM

## 2014-11-25 MED ORDER — EPINEPHRINE 0.3 MG/0.3ML IJ SOAJ: 0.3000 mg | Freq: Once | INTRAMUSCULAR | Status: AC

## 2014-11-26 ENCOUNTER — Ambulatory Visit: Payer: Medicare Other | Admitting: Physical Therapy

## 2014-11-26 ENCOUNTER — Encounter: Payer: Self-pay | Admitting: Physical Therapy

## 2014-11-26 DIAGNOSIS — M25562 Pain in left knee: Secondary | ICD-10-CM

## 2014-11-26 DIAGNOSIS — M25662 Stiffness of left knee, not elsewhere classified: Secondary | ICD-10-CM | POA: Diagnosis not present
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
## 2014-11-28 ENCOUNTER — Ambulatory Visit: Payer: Medicare Other | Admitting: Physical Therapy

## 2014-12-03 ENCOUNTER — Encounter: Payer: Self-pay | Admitting: Physical Therapy

## 2014-12-03 ENCOUNTER — Ambulatory Visit: Payer: Medicare Other | Admitting: Physical Therapy

## 2014-12-03 DIAGNOSIS — M25562 Pain in left knee: Secondary | ICD-10-CM

## 2014-12-03 DIAGNOSIS — R262 Difficulty in walking, not elsewhere classified: Secondary | ICD-10-CM

## 2014-12-03 DIAGNOSIS — M25662 Stiffness of left knee, not elsewhere classified: Secondary | ICD-10-CM
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
## 2014-12-05 ENCOUNTER — Ambulatory Visit (INDEPENDENT_AMBULATORY_CARE_PROVIDER_SITE_OTHER): Payer: Medicare Other | Admitting: *Deleted

## 2014-12-05 DIAGNOSIS — J309 Allergic rhinitis, unspecified: Secondary | ICD-10-CM

## 2014-12-06 ENCOUNTER — Ambulatory Visit: Payer: Medicare Other | Admitting: Physical Therapy

## 2014-12-06 DIAGNOSIS — M25662 Stiffness of left knee, not elsewhere classified: Secondary | ICD-10-CM

## 2014-12-06 DIAGNOSIS — M25562 Pain in left knee: Secondary | ICD-10-CM

## 2014-12-06 DIAGNOSIS — R269 Unspecified abnormalities of gait and mobility: Secondary | ICD-10-CM

## 2014-12-06 DIAGNOSIS — R262 Difficulty in walking, not elsewhere classified: Secondary | ICD-10-CM
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
## 2014-12-09 ENCOUNTER — Ambulatory Visit (INDEPENDENT_AMBULATORY_CARE_PROVIDER_SITE_OTHER): Payer: Medicare Other | Admitting: Physician Assistant

## 2014-12-09 ENCOUNTER — Encounter: Payer: Self-pay | Admitting: Physician Assistant

## 2014-12-09 DIAGNOSIS — I2510 Atherosclerotic heart disease of native coronary artery without angina pectoris: Secondary | ICD-10-CM

## 2014-12-09 MED ORDER — METOPROLOL TARTRATE 25 MG PO TABS: 12.5000 mg | ORAL_TABLET | Freq: Two times a day (BID) | ORAL | Status: DC

## 2014-12-09 MED ORDER — ATORVASTATIN CALCIUM 80 MG PO TABS: ORAL_TABLET | ORAL | Status: DC

## 2014-12-09 NOTE — Assessment & Plan Note (Signed)
Blood pressure well controlled

## 2014-12-09 NOTE — Patient Instructions (Signed)
Medication Instructions:  Your physician recommends that you continue on your current medications as directed. Please refer to the Current Medication list given to you today.  Labwork: When you have your cholesterol checked by your Primary Care Physician in December have them send Dr Harrington Challenger a copy  Testing/Procedures: none  Follow-Up: Your physician wants you to follow-up in: 6 months with Dr Theressa Stamps will receive a reminder letter in the mail two months in advance. If you don't receive a letter, please call our office to schedule the follow-up appointment.  Any Other Special Instructions Will Be Listed Below (If Applicable). Work harder on diet and weight loss

## 2014-12-09 NOTE — Assessment & Plan Note (Signed)
Controlled with beta blocker  

## 2014-12-09 NOTE — Assessment & Plan Note (Signed)
Patient has CAD on cath in 11/2013. He has had no further angina. If he does a noninvasive nuclear stress test is indicated to evaluate potential ischemia in the LAD distribution. Follow-up with Dr. Harrington Challenger in 6 months.
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
## 2014-12-09 NOTE — Assessment & Plan Note (Signed)
Diet and exercise recommended.

## 2014-12-10 ENCOUNTER — Encounter: Payer: Self-pay | Admitting: Physical Therapy

## 2014-12-10 ENCOUNTER — Ambulatory Visit: Payer: Medicare Other | Admitting: Physical Therapy

## 2014-12-10 DIAGNOSIS — M25662 Stiffness of left knee, not elsewhere classified: Secondary | ICD-10-CM | POA: Diagnosis not present

## 2014-12-10 DIAGNOSIS — M25562 Pain in left knee: Secondary | ICD-10-CM
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
## 2014-12-11 ENCOUNTER — Ambulatory Visit (INDEPENDENT_AMBULATORY_CARE_PROVIDER_SITE_OTHER): Payer: Medicare Other

## 2014-12-11 DIAGNOSIS — J309 Allergic rhinitis, unspecified: Secondary | ICD-10-CM | POA: Diagnosis not present

## 2014-12-12 ENCOUNTER — Ambulatory Visit: Payer: Medicare Other | Admitting: Physical Therapy

## 2014-12-12 ENCOUNTER — Encounter: Payer: Self-pay | Admitting: Physical Therapy

## 2014-12-12 DIAGNOSIS — M25662 Stiffness of left knee, not elsewhere classified: Secondary | ICD-10-CM | POA: Diagnosis not present

## 2014-12-12 DIAGNOSIS — R262 Difficulty in walking, not elsewhere classified: Secondary | ICD-10-CM

## 2014-12-12 DIAGNOSIS — M25562 Pain in left knee: Secondary | ICD-10-CM
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
## 2014-12-16 ENCOUNTER — Encounter: Payer: Self-pay | Admitting: Physical Therapy

## 2014-12-16 ENCOUNTER — Ambulatory Visit: Payer: Medicare Other | Admitting: Physical Therapy

## 2014-12-16 DIAGNOSIS — R262 Difficulty in walking, not elsewhere classified: Secondary | ICD-10-CM

## 2014-12-16 DIAGNOSIS — M25662 Stiffness of left knee, not elsewhere classified: Secondary | ICD-10-CM | POA: Diagnosis not present

## 2014-12-16 DIAGNOSIS — M25562 Pain in left knee: Secondary | ICD-10-CM
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
## 2014-12-19 ENCOUNTER — Ambulatory Visit: Payer: Medicare Other | Admitting: Physical Therapy

## 2014-12-19 ENCOUNTER — Ambulatory Visit: Payer: Medicare Other | Attending: Orthopaedic Surgery | Admitting: Physical Therapy

## 2014-12-19 ENCOUNTER — Encounter: Payer: Self-pay | Admitting: Physical Therapy

## 2014-12-19 DIAGNOSIS — M25562 Pain in left knee: Secondary | ICD-10-CM | POA: Insufficient documentation

## 2014-12-19 DIAGNOSIS — M25662 Stiffness of left knee, not elsewhere classified: Secondary | ICD-10-CM | POA: Insufficient documentation
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
## 2014-12-23 ENCOUNTER — Ambulatory Visit (INDEPENDENT_AMBULATORY_CARE_PROVIDER_SITE_OTHER): Payer: Medicare Other

## 2014-12-23 ENCOUNTER — Ambulatory Visit: Payer: Medicare Other | Admitting: Physical Therapy

## 2014-12-23 DIAGNOSIS — J309 Allergic rhinitis, unspecified: Secondary | ICD-10-CM

## 2014-12-26 ENCOUNTER — Ambulatory Visit: Payer: Medicare Other | Admitting: Physical Therapy

## 2015-01-02 ENCOUNTER — Ambulatory Visit (INDEPENDENT_AMBULATORY_CARE_PROVIDER_SITE_OTHER): Payer: Medicare Other | Admitting: *Deleted

## 2015-01-02 DIAGNOSIS — J309 Allergic rhinitis, unspecified: Secondary | ICD-10-CM

## 2015-01-06 ENCOUNTER — Encounter: Payer: Self-pay | Admitting: Allergy and Immunology

## 2015-01-06 ENCOUNTER — Ambulatory Visit (INDEPENDENT_AMBULATORY_CARE_PROVIDER_SITE_OTHER): Payer: Medicare Other | Admitting: Allergy and Immunology

## 2015-01-06 VITALS — BP 120/72 | HR 82 | Temp 98.2°F | Resp 20 | Ht 65.0 in | Wt 242.0 lb

## 2015-01-06 DIAGNOSIS — J019 Acute sinusitis, unspecified: Secondary | ICD-10-CM | POA: Insufficient documentation

## 2015-01-06 DIAGNOSIS — J0100 Acute maxillary sinusitis, unspecified: Secondary | ICD-10-CM

## 2015-01-06 DIAGNOSIS — J209 Acute bronchitis, unspecified: Secondary | ICD-10-CM | POA: Insufficient documentation

## 2015-01-06 DIAGNOSIS — R059 Cough, unspecified: Secondary | ICD-10-CM

## 2015-01-06 DIAGNOSIS — R05 Cough: Secondary | ICD-10-CM

## 2015-01-06 MED ORDER — LEVALBUTEROL HCL 1.25 MG/3ML IN NEBU
1.2500 mg | INHALATION_SOLUTION | Freq: Once | RESPIRATORY_TRACT | Status: AC
  Administered 2015-01-06: 1.25 mg via RESPIRATORY_TRACT

## 2015-01-06 MED ORDER — IPRATROPIUM BROMIDE 0.02 % IN SOLN
0.5000 mg | Freq: Once | RESPIRATORY_TRACT | Status: AC
  Administered 2015-01-06: 0.5 mg via RESPIRATORY_TRACT

## 2015-01-06 MED ORDER — PREDNISONE 1 MG PO TABS: 10.0000 mg | ORAL_TABLET | ORAL | Status: DC

## 2015-01-06 MED ORDER — AZELASTINE HCL 0.1 % NA SOLN: NASAL | Status: DC

## 2015-01-06 NOTE — Progress Notes (Signed)
History of present illness: HPI Comments: Larry Church is a 81 y.o. male with allergic rhinitis and history of intermittent coughing/dyspnea who presents today for sick visit.  He reports that 3 days ago he developed nasal congestion, thick postnasal drainage, and a deep cough.  He denies fevers or chills.  He reports that overall his allergic rhinitis has improved significantly while on aeroallergen immunotherapy.  He currently takes Mucinex 60 mg twice a day and azelastine once daily.  He does not use nasal saline lavage.   Assessment and plan: Acute sinusitis  Prednisone has been provided, 20 mg x 4 days, 10 mg x1 day, then stop.  Increase Mucinex to 1200 mg twice a day with adequate hydration.  Nasal saline lavage as needed has been recommended along with instructions for proper administration.  A prescription has been provided for azelastine nasal spray, 1-2 sprays per nostril 2 times daily as needed. Proper nasal spray technique has been discussed and demonstrated.   The patient has been asked to contact me if his symptoms persist, progress, or if he becomes febrile. Otherwise, he may return for follow up in 4 months.  Coughing Most likely upper airway cough syndrome.  Spirometry is normal today.  Treatment plan as outlined above.    If symptoms persist or progress, we will evaluate further.    Medications ordered this encounter: Meds ordered this encounter  Medications  . levalbuterol (XOPENEX) nebulizer solution 1.25 mg    Sig:   . ipratropium (ATROVENT) nebulizer solution 0.5 mg    Sig:   . azelastine (ASTELIN) 0.1 % nasal spray    Sig: ONE TO TWO SPRAYS EACH NOSTRIL ONE TO TWO TIMES A DAY IF NEEDED FOR NASAL CONGESTION OR DRAINAGE.    Dispense:  30 mL    Refill:  5  . predniSONE (DELTASONE) tablet 10 mg    Sig:     Diagnositics: Spirometry reveals an FVC of 2.95 L and an FEV1 of 2.27 L without significant post-dilator improvement.     Physical  examination: Blood pressure 120/72, pulse 82, temperature 98.2 F (36.8 C), temperature source Oral, resp. rate 20, height 5\' 5"  (1.651 m), weight 242 lb (109.77 kg).  General: Alert, interactive, in no acute distress. HEENT: TMs pearly gray, turbinates markedly edematous with thick discharge, post-pharynx erythematous. Neck: Supple without lymphadenopathy. Lungs: Clear to auscultation without wheezing, rhonchi or rales. CV: Normal S1, S2 without murmurs. Skin: Warm and dry, without lesions or rashes.  The following portions of the patient's history were reviewed and updated as appropriate: allergies, current medications, past family history, past medical history, past social history, past surgical history and problem list.  Outpatient medications:   Medication List       This list is accurate as of: 01/06/15 11:42 AM.  Always use your most recent med list.               acetaminophen 325 MG tablet  Commonly known as:  TYLENOL  Take 2 tablets (650 mg total) by mouth every 6 (six) hours as needed for mild pain.     albuterol 108 (90 BASE) MCG/ACT inhaler  Commonly known as:  PROVENTIL HFA;VENTOLIN HFA  Inhale 2 puffs into the lungs every 6 (six) hours as needed for wheezing.     aspirin 325 MG EC tablet  Take 1 tablet (325 mg total) by mouth 2 (two) times daily after a meal.     atorvastatin 80 MG tablet  Commonly known as:  LIPITOR  TAKE 1 TABLET BY MOUTH EVERY DAY AT 6PM     azelastine 0.1 % nasal spray  Commonly known as:  ASTELIN  ONE TO TWO SPRAYS EACH NOSTRIL ONE TO TWO TIMES A DAY IF NEEDED FOR NASAL CONGESTION OR DRAINAGE.     b complex vitamins tablet  Take 1 tablet by mouth daily.     BIOFREEZE EX  Apply 1 application topically daily as needed (pain).     BLUE-EMU SUPER STRENGTH Crea  Apply 1 application topically daily as needed (pain).     calcium carbonate 500 MG chewable tablet  Commonly known as:  TUMS - dosed in mg elemental calcium  Chew 1 tablet  by mouth as needed for indigestion or heartburn.     clindamycin 150 MG capsule  Commonly known as:  CLEOCIN  Take 600 mg by mouth once. For dental procedure     EPINEPHrine 0.3 mg/0.3 mL Soaj injection  Commonly known as:  EPIPEN 2-PAK  Inject 0.3 mLs (0.3 mg total) into the muscle once.     esomeprazole 20 MG capsule  Commonly known as:  NEXIUM  Take 20 mg by mouth as needed (for heartburn).     fexofenadine 60 MG tablet  Commonly known as:  ALLEGRA  Take 60 mg by mouth 2 (two) times daily.     guaiFENesin 600 MG 12 hr tablet  Commonly known as:  MUCINEX  Take 1,200 mg by mouth 2 (two) times daily as needed for cough or to loosen phlegm.     HYDROcodone-acetaminophen 5-325 MG tablet  Commonly known as:  NORCO/VICODIN  Take 1-2 tablets by mouth every 4 (four) hours as needed (breakthrough pain).     memantine 10 MG tablet  Commonly known as:  NAMENDA  1/2 DAILY FOR 1 WEEK THEN 1/2 BID FOR A WEEK THEN 1/2 IN AM  AND 1 IN PM FOR A WEEK THEN 1 BID     methocarbamol 500 MG tablet  Commonly known as:  ROBAXIN  Take 1 tablet (500 mg total) by mouth every 6 (six) hours as needed for muscle spasms.     metoprolol tartrate 25 MG tablet  Commonly known as:  LOPRESSOR  Take 0.5 tablets (12.5 mg total) by mouth 2 (two) times daily.     MUCINEX DM MAXIMUM STRENGTH 60-1200 MG Tb12  Take 1,200 mg by mouth.     MYRBETRIQ 25 MG Tb24 tablet  Generic drug:  mirabegron ER     NASONEX 50 MCG/ACT nasal spray  Generic drug:  mometasone  Place 2 sprays into the nose as needed (for congestion).     nitroGLYCERIN 0.4 MG SL tablet  Commonly known as:  NITROSTAT  Place 1 tablet (0.4 mg total) under the tongue every 5 (five) minutes x 3 doses as needed for chest pain.     Olopatadine HCl 0.6 % Soln  Place 1 Bottle into both nostrils as needed. Sinus irrigation     Omega-3 1000 MG Caps  Take 554 mg by mouth.     omeprazole 40 MG capsule  Commonly known as:  PRILOSEC  Take 40 mg by  mouth as needed (for heartburn).     oxyCODONE-acetaminophen 5-325 MG tablet  Commonly known as:  PERCOCET/ROXICET     Psyllium 48.57 % Powd  Take 2 packets by mouth.     SYSTANE OP  Place 1 drop into both eyes as needed (for dry eyes).     venlafaxine XR 150 MG 24 hr capsule  Commonly known as:  EFFEXOR-XR  Take 150 mg by mouth daily.     VESICARE 5 MG tablet  Generic drug:  solifenacin  Take 5 mg by mouth daily.     vitamin B-12 100 MCG tablet  Commonly known as:  CYANOCOBALAMIN  Take 100 mcg by mouth daily.     WELLBUTRIN SR 150 MG 12 hr tablet  Generic drug:  buPROPion  Take 150 mg by mouth.        Known medication allergies: Allergies  Allergen Reactions  . Penicillins Swelling, Other (See Comments) and Anaphylaxis    Throat Swelling  . Donepezil Other (See Comments)    vertigo  . Methocarbamol     tiredness  . Bee Venom Other (See Comments)    unknown    I appreciate the opportunity to take part in this Crispin's care. Please do not hesitate to contact me with questions.  Sincerely,   R. Edgar Frisk, MD

## 2015-01-06 NOTE — Patient Instructions (Addendum)
  Acute sinusitis  Prednisone has been provided, 20 mg x 4 days, 10 mg x1 day, then stop.  Increase Mucinex to 1200 mg twice a day with adequate hydration.  Nasal saline lavage as needed has been recommended along with instructions for proper administration.  A prescription has been provided for azelastine nasal spray, 1-2 sprays per nostril 2 times daily as needed. Proper nasal spray technique has been discussed and demonstrated.   The patient has been asked to contact me if his symptoms persist, progress, or if he becomes febrile. Otherwise, he may return for follow up in 4 months.  Coughing Most likely upper airway cough syndrome.  Spirometry is normal today.  Treatment plan as outlined above.    If symptoms persist or progress, we will evaluate further.

## 2015-01-06 NOTE — Assessment & Plan Note (Signed)
   Prednisone has been provided, 20 mg x 4 days, 10 mg x1 day, then stop.  Increase Mucinex to 1200 mg twice a day with adequate hydration.  Nasal saline lavage as needed has been recommended along with instructions for proper administration.  A prescription has been provided for azelastine nasal spray, 1-2 sprays per nostril 2 times daily as needed. Proper nasal spray technique has been discussed and demonstrated.   The patient has been asked to contact me if his symptoms persist, progress, or if he becomes febrile. Otherwise, he may return for follow up in 4 months.

## 2015-01-06 NOTE — Assessment & Plan Note (Signed)
Most likely upper airway cough syndrome.  Spirometry is normal today.  Treatment plan as outlined above.    If symptoms persist or progress, we will evaluate further.

## 2015-01-07 ENCOUNTER — Encounter: Payer: Self-pay | Admitting: *Deleted

## 2015-01-08 ENCOUNTER — Other Ambulatory Visit: Payer: Self-pay | Admitting: Allergy

## 2015-01-08 ENCOUNTER — Telehealth: Payer: Self-pay | Admitting: Pediatrics

## 2015-01-08 MED ORDER — AZITHROMYCIN 250 MG PO TABS: ORAL_TABLET | ORAL | Status: DC

## 2015-01-08 NOTE — Telephone Encounter (Signed)
FAXED IN RX.FOR Z-PAK AND INFORMED PATIENT THAT WE CALLED IN.

## 2015-01-08 NOTE — Telephone Encounter (Signed)
Pt wife called that patient is coughing and symptoms have not cleared from the Prednisone given in office this past Monday. Wants to know if antibiotic can be called in?

## 2015-01-08 NOTE — Telephone Encounter (Signed)
Please provide a prescription for azithromycin, 500 mg on day 1 and 250 mg on days 2 through 5.  He is to continue other therapeutic modalities discussed on Monday.

## 2015-01-16 ENCOUNTER — Encounter: Payer: Self-pay | Admitting: Pediatrics

## 2015-01-16 ENCOUNTER — Ambulatory Visit (INDEPENDENT_AMBULATORY_CARE_PROVIDER_SITE_OTHER): Payer: Medicare Other | Admitting: Pediatrics

## 2015-01-16 VITALS — BP 128/74 | HR 82 | Temp 98.2°F | Resp 24 | Ht 65.0 in

## 2015-01-16 DIAGNOSIS — J4521 Mild intermittent asthma with (acute) exacerbation: Secondary | ICD-10-CM

## 2015-01-16 DIAGNOSIS — J0100 Acute maxillary sinusitis, unspecified: Secondary | ICD-10-CM

## 2015-01-16 DIAGNOSIS — J301 Allergic rhinitis due to pollen: Secondary | ICD-10-CM

## 2015-01-16 DIAGNOSIS — Z79899 Other long term (current) drug therapy: Secondary | ICD-10-CM | POA: Diagnosis not present

## 2015-01-16 DIAGNOSIS — G4733 Obstructive sleep apnea (adult) (pediatric): Secondary | ICD-10-CM | POA: Insufficient documentation

## 2015-01-16 DIAGNOSIS — K219 Gastro-esophageal reflux disease without esophagitis: Secondary | ICD-10-CM | POA: Diagnosis not present

## 2015-01-16 DIAGNOSIS — J4520 Mild intermittent asthma, uncomplicated: Secondary | ICD-10-CM | POA: Insufficient documentation

## 2015-01-16 MED ORDER — MONTELUKAST SODIUM 10 MG PO TABS: 10.0000 mg | ORAL_TABLET | Freq: Every day | ORAL | Status: DC

## 2015-01-16 MED ORDER — ALBUTEROL SULFATE (2.5 MG/3ML) 0.083% IN NEBU
2.5000 mg | INHALATION_SOLUTION | Freq: Once | RESPIRATORY_TRACT | Status: AC
  Administered 2015-01-16: 2.5 mg via RESPIRATORY_TRACT

## 2015-01-16 MED ORDER — CEFDINIR 300 MG PO CAPS: ORAL_CAPSULE | ORAL | Status: DC

## 2015-01-16 NOTE — Patient Instructions (Addendum)
Fluticasone 2 sprays per nostril at night Montelukast  10 mg once a day for cough or wheezing Cefdinir 300 mg capsules to take 2 capsules every 24 hours for 10 days Azelastine 2 sprays per nostril twice a day Ventolin 2 puffs every 4 hours if needed Prednisone 10 mg twice a day for 4 days 10 mg on the fifth day Continue Allegra 60 mg twice a day and Mucinex 1200 mg twice a day  He will call me if he is not improving on this treatment plan. Continue on his allergy injections every 2 weeks

## 2015-01-16 NOTE — Progress Notes (Signed)
[place] [id number] Dept: [phone]  FOLLOW UP NOTE  Patient ID: Larry Church, male    DOB: Jul 22, 1932  Age: 81 y.o. MRN: [id number] Date of Office Visit: 01/16/2015  Assessment Chief Complaint: Cough  HPI Larry Church presents for treatment of coughing spells and postnasal drainage for 2 weeks. He is on allergy injections every 2 weeks. He was given prednisone for 5 days 2 weeks ago but didn't improve significantly. The coughing spells to wake him up at night  His current medications are outlined in the chart.   Drug Allergies:  Allergies  Allergen Reactions  . Penicillins Swelling, Other (See Comments) and Anaphylaxis    Throat Swelling  . Donepezil Other (See Comments)    vertigo  . Methocarbamol     tiredness  . Bee Venom Other (See Comments)    unknown    Physical Exam: BP 128/74 mmHg  Pulse 82  Temp(Src) 98.2 F (36.8 C) (Oral)  Resp 24  Ht 5\' 5"  (1.651 m)  SpO2 94%   Physical Exam  Constitutional: He appears well-developed and well-nourished.  HENT:  Eyes normal. Ears normal. Nose mild swelling of nasal turbinates. Pharynx normal except for a yellow postnasal drainage  Neck: Neck supple.  Cardiovascular:  S1 and S2 normal no murmurs  Pulmonary/Chest:  Clear to percussion and auscultation  Lymphadenopathy:    He has no cervical adenopathy.  Psychiatric: He has a normal mood and affect. His behavior is normal. Judgment and thought content normal.  Vitals reviewed.   Diagnostics:   FVC 3.14 L FEV1 2.62 L. Predicted FVC 3.21 L predicted FEV1 2.25 L-the spirometry is in the normal range  Assessment and Plan: 1. Mild intermittent asthma, with acute exacerbation   2. Obstructive sleep apnea treated with bilevel positive airway pressure (BiPAP)   3. Allergic rhinitis due to pollen   4. Gastroesophageal reflux disease without esophagitis   5. Current use of beta blocker   6. Acute maxillary sinusitis, recurrence not specified      Meds ordered this encounter  Medications  . albuterol (PROVENTIL) (2.5 MG/3ML) 0.083% nebulizer solution 2.5 mg    Sig:   . cefdinir (OMNICEF) 300 MG capsule    Sig: Take 2 Capsules Every 24 Hours for 10 Days.    Dispense:  20 capsule    Refill:  0  . montelukast (SINGULAIR) 10 MG tablet    Sig: Take 1 tablet (10 mg total) by mouth at bedtime.    Dispense:  30 tablet    Refill:  5    Patient Instructions  Fluticasone 2 sprays per nostril at night Montelukast  10 mg once a day for cough or wheezing Cefdinir 300 mg capsules to take 2 capsules every 24 hours for 10 days Azelastine 2 sprays per nostril twice a day Ventolin 2 puffs every 4 hours if needed Prednisone 10 mg twice a day for 4 days 10 mg on the fifth day Continue Allegra 60 mg twice a day and Mucinex 1200 mg twice a day  He will call me if he is not improving on this treatment plan. Continue on his allergy injections every 2 weeks    Return in about 6 months (around 07/17/2015).    Thank you for the opportunity to care for this patient.  Please do not hesitate to contact me with questions.  Penne Lash, M.D.  [facility] [address] [place],  [address] [phone]

## 2015-01-18 ENCOUNTER — Other Ambulatory Visit: Payer: Self-pay | Admitting: Physician Assistant

## 2015-01-20 NOTE — Telephone Encounter (Signed)
REFILL 

## 2015-02-18 ENCOUNTER — Ambulatory Visit (INDEPENDENT_AMBULATORY_CARE_PROVIDER_SITE_OTHER): Payer: Medicare Other | Admitting: *Deleted

## 2015-02-18 DIAGNOSIS — J309 Allergic rhinitis, unspecified: Secondary | ICD-10-CM | POA: Diagnosis not present

## 2015-03-12 ENCOUNTER — Ambulatory Visit (INDEPENDENT_AMBULATORY_CARE_PROVIDER_SITE_OTHER): Payer: Medicare Other | Admitting: *Deleted

## 2015-03-12 DIAGNOSIS — J309 Allergic rhinitis, unspecified: Secondary | ICD-10-CM

## 2015-03-27 ENCOUNTER — Other Ambulatory Visit: Payer: Self-pay | Admitting: Orthopaedic Surgery

## 2015-03-27 DIAGNOSIS — M545 Low back pain: Secondary | ICD-10-CM

## 2015-03-27 DIAGNOSIS — M4800 Spinal stenosis, site unspecified: Secondary | ICD-10-CM

## 2015-04-01 ENCOUNTER — Ambulatory Visit (INDEPENDENT_AMBULATORY_CARE_PROVIDER_SITE_OTHER): Payer: Medicare Other

## 2015-04-01 DIAGNOSIS — J309 Allergic rhinitis, unspecified: Secondary | ICD-10-CM | POA: Diagnosis not present

## 2015-04-06 ENCOUNTER — Ambulatory Visit
Admission: RE | Admit: 2015-04-06 | Discharge: 2015-04-06 | Disposition: A | Discharge status: Home / Self Care | Payer: Medicare Other | Source: Ambulatory Visit | Attending: Orthopaedic Surgery | Admitting: Orthopaedic Surgery

## 2015-04-06 DIAGNOSIS — M545 Low back pain: Secondary | ICD-10-CM

## 2015-04-06 DIAGNOSIS — M4800 Spinal stenosis, site unspecified: Secondary | ICD-10-CM

## 2015-04-12 ENCOUNTER — Other Ambulatory Visit: Payer: Self-pay | Admitting: Physician Assistant

## 2015-04-18 ENCOUNTER — Encounter: Payer: Self-pay | Admitting: Internal Medicine

## 2015-04-24 ENCOUNTER — Ambulatory Visit (INDEPENDENT_AMBULATORY_CARE_PROVIDER_SITE_OTHER): Payer: Medicare Other | Admitting: *Deleted

## 2015-04-24 DIAGNOSIS — J309 Allergic rhinitis, unspecified: Secondary | ICD-10-CM | POA: Diagnosis not present

## 2015-05-14 ENCOUNTER — Emergency Department (HOSPITAL_BASED_OUTPATIENT_CLINIC_OR_DEPARTMENT_OTHER)
Admission: EM | Admit: 2015-05-14 | Discharge: 2015-05-14 | Disposition: A | Discharge status: Home / Self Care | Payer: Medicare Other | Attending: Emergency Medicine | Admitting: Emergency Medicine

## 2015-05-14 ENCOUNTER — Encounter (HOSPITAL_BASED_OUTPATIENT_CLINIC_OR_DEPARTMENT_OTHER): Payer: Self-pay

## 2015-05-14 ENCOUNTER — Emergency Department (HOSPITAL_BASED_OUTPATIENT_CLINIC_OR_DEPARTMENT_OTHER): Payer: Medicare Other

## 2015-05-14 DIAGNOSIS — Z79899 Other long term (current) drug therapy: Secondary | ICD-10-CM | POA: Diagnosis not present

## 2015-05-14 DIAGNOSIS — R3915 Urgency of urination: Secondary | ICD-10-CM | POA: Diagnosis present

## 2015-05-14 DIAGNOSIS — K625 Hemorrhage of anus and rectum: Secondary | ICD-10-CM | POA: Insufficient documentation

## 2015-05-14 DIAGNOSIS — F419 Anxiety disorder, unspecified: Secondary | ICD-10-CM | POA: Insufficient documentation

## 2015-05-14 DIAGNOSIS — Z85828 Personal history of other malignant neoplasm of skin: Secondary | ICD-10-CM | POA: Diagnosis not present

## 2015-05-14 DIAGNOSIS — R011 Cardiac murmur, unspecified: Secondary | ICD-10-CM | POA: Insufficient documentation

## 2015-05-14 DIAGNOSIS — R1031 Right lower quadrant pain: Secondary | ICD-10-CM | POA: Diagnosis not present

## 2015-05-14 DIAGNOSIS — M1990 Unspecified osteoarthritis, unspecified site: Secondary | ICD-10-CM | POA: Insufficient documentation

## 2015-05-14 DIAGNOSIS — R339 Retention of urine, unspecified: Secondary | ICD-10-CM | POA: Diagnosis not present

## 2015-05-14 DIAGNOSIS — Z87448 Personal history of other diseases of urinary system: Secondary | ICD-10-CM | POA: Diagnosis not present

## 2015-05-14 DIAGNOSIS — Z9981 Dependence on supplemental oxygen: Secondary | ICD-10-CM | POA: Diagnosis not present

## 2015-05-14 DIAGNOSIS — I25119 Atherosclerotic heart disease of native coronary artery with unspecified angina pectoris: Secondary | ICD-10-CM | POA: Diagnosis not present

## 2015-05-14 DIAGNOSIS — J45909 Unspecified asthma, uncomplicated: Secondary | ICD-10-CM | POA: Diagnosis not present

## 2015-05-14 DIAGNOSIS — Z9889 Other specified postprocedural states: Secondary | ICD-10-CM | POA: Insufficient documentation

## 2015-05-14 DIAGNOSIS — G4733 Obstructive sleep apnea (adult) (pediatric): Secondary | ICD-10-CM | POA: Insufficient documentation

## 2015-05-14 DIAGNOSIS — G8929 Other chronic pain: Secondary | ICD-10-CM | POA: Diagnosis not present

## 2015-05-14 DIAGNOSIS — K5900 Constipation, unspecified: Secondary | ICD-10-CM | POA: Diagnosis not present

## 2015-05-14 DIAGNOSIS — Z87891 Personal history of nicotine dependence: Secondary | ICD-10-CM | POA: Insufficient documentation

## 2015-05-14 DIAGNOSIS — Z7982 Long term (current) use of aspirin: Secondary | ICD-10-CM | POA: Diagnosis not present

## 2015-05-14 DIAGNOSIS — E7800 Pure hypercholesterolemia, unspecified: Secondary | ICD-10-CM | POA: Insufficient documentation

## 2015-05-14 DIAGNOSIS — F329 Major depressive disorder, single episode, unspecified: Secondary | ICD-10-CM | POA: Insufficient documentation

## 2015-05-14 LAB — CBC WITH DIFFERENTIAL/PLATELET
Basophils Absolute: 0 10*3/uL (ref 0.0–0.1)
Basophils Relative: 0 %
Eosinophils Absolute: 0.1 10*3/uL (ref 0.0–0.7)
Eosinophils Relative: 0 %
HCT: 45.2 % (ref 39.0–52.0)
Hemoglobin: 15.6 g/dL (ref 13.0–17.0)
Lymphocytes Relative: 6 %
Lymphs Abs: 0.9 10*3/uL (ref 0.7–4.0)
MCH: 32.9 pg (ref 26.0–34.0)
MCHC: 34.5 g/dL (ref 30.0–36.0)
MCV: 95.4 fL (ref 78.0–100.0)
Monocytes Absolute: 1 10*3/uL (ref 0.1–1.0)
Monocytes Relative: 6 %
Neutro Abs: 14 10*3/uL — ABNORMAL HIGH (ref 1.7–7.7)
Neutrophils Relative %: 88 %
Platelets: 261 10*3/uL (ref 150–400)
RBC: 4.74 MIL/uL (ref 4.22–5.81)
RDW: 14.9 % (ref 11.5–15.5)
WBC: 16 10*3/uL — ABNORMAL HIGH (ref 4.0–10.5)

## 2015-05-14 LAB — BASIC METABOLIC PANEL
Anion gap: 11 (ref 5–15)
BUN: 27 mg/dL — ABNORMAL HIGH (ref 6–20)
CO2: 20 mmol/L — ABNORMAL LOW (ref 22–32)
Calcium: 9.8 mg/dL (ref 8.9–10.3)
Chloride: 104 mmol/L (ref 101–111)
Creatinine, Ser: 1.14 mg/dL (ref 0.61–1.24)
GFR calc Af Amer: >60 mL/min (ref >60)
GFR calc non Af Amer: 59 mL/min — ABNORMAL LOW (ref >60)
Glucose, Bld: 172 mg/dL — ABNORMAL HIGH (ref 65–99)
Potassium: 4.2 mmol/L (ref 3.5–5.1)
Sodium: 135 mmol/L (ref 135–145)

## 2015-05-14 LAB — URINALYSIS, ROUTINE W REFLEX MICROSCOPIC
Bilirubin Urine: NEGATIVE
Glucose, UA: NEGATIVE mg/dL
Hgb urine dipstick: NEGATIVE
Ketones, ur: NEGATIVE mg/dL
Leukocytes, UA: NEGATIVE
Nitrite: NEGATIVE
Protein, ur: NEGATIVE mg/dL
Specific Gravity, Urine: 1.015 (ref 1.005–1.030)
pH: 5.5 (ref 5.0–8.0)

## 2015-05-14 MED ORDER — IOPAMIDOL (ISOVUE-300) INJECTION 61%
100.0000 mL | Freq: Once | INTRAVENOUS | Status: AC | PRN
  Administered 2015-05-14: 100 mL via INTRAVENOUS

## 2015-05-14 MED ORDER — SODIUM CHLORIDE 0.9 % IV BOLUS (SEPSIS)
1000.0000 mL | Freq: Once | INTRAVENOUS | Status: AC
  Administered 2015-05-14: 1000 mL via INTRAVENOUS

## 2015-05-14 MED ORDER — TAMSULOSIN HCL 0.4 MG PO CAPS: 0.4000 mg | ORAL_CAPSULE | Freq: Every day | ORAL | Status: DC

## 2015-05-14 NOTE — ED Provider Notes (Signed)
Patient reports that he can't urinate and can't have a bowel movement since 5 AM today. He has suffered from urinary retention in the past, and didn't treated with Foley catheter. He complains of discomfort of suprapubic area He denies other associated symptoms. No treatment prior to coming here no chest pain  no shortness of breath On exam patient is alert nontoxic appearing heart mildly tachycardic regular rhythm lungs clear auscultation wrist were rate counted a 28 by me abdomen obese, nontender. Genitalia normal male  Orlie Dakin, MD 05/15/15 [phone]
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
## 2015-05-14 NOTE — ED Notes (Signed)
Patient transported to CT via stretcher, sr x 2 up  

## 2015-05-14 NOTE — ED Notes (Signed)
Catheter left in place and leg bag put on, per MD

## 2015-05-14 NOTE — ED Notes (Signed)
C/o constipation-last BM yesterday-pt walking with own cane

## 2015-05-14 NOTE — ED Notes (Signed)
Pt repositioned. Denies any new pain. Resting comfortably. Awaiting reeval. No problems with fluid challenge.

## 2015-05-14 NOTE — Discharge Instructions (Signed)
Read the information below.  Use the prescribed medication as directed.  Please discuss all new medications with your pharmacist.  You may return to the Emergency Department at any time for worsening condition or any new symptoms that concern you.    If you develop high fevers, worsening abdominal pain, uncontrolled vomiting, or are unable to tolerate fluids by mouth, return to the ER for a recheck.   Please take your home medications tonight.  Your heartrate was high today.  Please see your primary care provider or the urologist this week for a recheck.  Drink plenty of water.    Acute Urinary Retention, Male Acute urinary retention is the temporary inability to urinate. This is a common problem in older men. As men age their prostates become larger and block the flow of urine from the bladder. This is usually a problem that has come on gradually.  HOME CARE INSTRUCTIONS If you are sent home with a Foley catheter and a drainage system, you will need to discuss the best course of action with your health care provider. While the catheter is in, maintain a good intake of fluids. Keep the drainage bag emptied and lower than your catheter. This is so that contaminated urine will not flow back into your bladder, which could lead to a urinary tract infection. There are two main types of drainage bags. One is a large bag that usually is used at night. It has a good capacity that will allow you to sleep through the night without having to empty it. The second type is called a leg bag. It has a smaller capacity, so it needs to be emptied more frequently. However, the main advantage is that it can be attached by a leg strap and can go underneath your clothing, allowing you the freedom to move about or leave your home. Only take over-the-counter or prescription medicines for pain, discomfort, or fever as directed by your health care provider.  SEEK MEDICAL CARE IF:  You develop a low-grade fever.  You experience  spasms or leakage of urine with the spasms. SEEK IMMEDIATE MEDICAL CARE IF:   You develop chills or fever.  Your catheter stops draining urine.  Your catheter falls out.  You start to develop increased bleeding that does not respond to rest and increased fluid intake. MAKE SURE YOU:  Understand these instructions.  Will watch your condition.  Will get help right away if you are not doing well or get worse.   This information is not intended to replace advice given to you by your health care provider. Make sure you discuss any questions you have with your health care provider.   Document Released: 05/10/2000 Document Revised: 06/18/2014 Document Reviewed: 07/13/2012 Elsevier Interactive Patient Education [employer].

## 2015-05-20 ENCOUNTER — Ambulatory Visit (INDEPENDENT_AMBULATORY_CARE_PROVIDER_SITE_OTHER): Payer: Medicare Other

## 2015-05-20 DIAGNOSIS — J309 Allergic rhinitis, unspecified: Secondary | ICD-10-CM | POA: Diagnosis not present

## 2015-05-23 ENCOUNTER — Other Ambulatory Visit: Payer: Self-pay | Admitting: Orthopedic Surgery

## 2015-05-23 DIAGNOSIS — M545 Low back pain: Secondary | ICD-10-CM

## 2015-05-31 ENCOUNTER — Other Ambulatory Visit: Payer: Medicare Other

## 2015-06-01 ENCOUNTER — Ambulatory Visit
Admission: RE | Admit: 2015-06-01 | Discharge: 2015-06-01 | Disposition: A | Discharge status: Home / Self Care | Payer: Medicare Other | Source: Ambulatory Visit | Attending: Orthopedic Surgery | Admitting: Orthopedic Surgery

## 2015-06-01 DIAGNOSIS — M545 Low back pain: Secondary | ICD-10-CM

## 2015-06-13 ENCOUNTER — Encounter: Payer: Self-pay | Admitting: Internal Medicine

## 2015-06-13 ENCOUNTER — Ambulatory Visit (INDEPENDENT_AMBULATORY_CARE_PROVIDER_SITE_OTHER): Payer: Medicare Other | Admitting: Internal Medicine

## 2015-06-13 ENCOUNTER — Other Ambulatory Visit: Payer: Self-pay

## 2015-06-13 VITALS — BP 112/60 | HR 92 | Ht 65.0 in | Wt 240.0 lb

## 2015-06-13 DIAGNOSIS — E785 Hyperlipidemia, unspecified: Secondary | ICD-10-CM | POA: Diagnosis not present

## 2015-06-13 DIAGNOSIS — I10 Essential (primary) hypertension: Secondary | ICD-10-CM

## 2015-06-13 MED ORDER — ATORVASTATIN CALCIUM 40 MG PO TABS: 80.0000 mg | ORAL_TABLET | Freq: Every day | ORAL | Status: DC

## 2015-06-13 MED ORDER — ATORVASTATIN CALCIUM 80 MG PO TABS: 80.0000 mg | ORAL_TABLET | Freq: Every day | ORAL | Status: DC

## 2015-06-13 NOTE — Patient Instructions (Signed)
Your physician recommends that you continue on your current medications as directed. Please refer to the Current Medication list given to you today.  Your physician wants you to follow-up in: 6 MONTHS WITH DR. ROSS.   You will receive a reminder letter in the mail two months in advance. If you don't receive a letter, please call our office to schedule the follow-up appointment.  

## 2015-06-13 NOTE — Progress Notes (Signed)
Cardiology Office Note   Date:  06/13/2015   ID:  BAYARDO NASSIF, DOB [birth year], MRN [id number]  PCP:  Lilian Coma., MD  Cardiologist:   Dorris Carnes, MD   No chief complaint on file.  F/U of CAD     History of Present Illness: Larry Church is a 82 y.o. male with a history of  Larry Church is a 82 y.o. male who presents for follow-up. He has history of CAD with cardiac catheterization 11/2013 revealing diffuse mild to moderate disease with ectatic circumflex and RCA. Most notable lesions would be tandem lesions in the LAD but there was no culprit lesion. EF 50%. 2-D echo EF 45-50%. Patient also had bigeminy and 6 beats of what was thought to be artifact. Beta blocker was recommended. He had a fall back in January and had a subarachnoid hemorrhage observed overnight. He also has hyperlipidemia. He's had balance issues and on is doing vestibular/balance rehabilitation. He had left knee arthroplasty 09/24/14 and is doing physical therapy.   Denies CP  Breathing is OK  No dizziness Having problems swallowing 80 mg tab  Wants to try 40 x 2     Outpatient Prescriptions Prior to Visit  Medication Sig Dispense Refill  . acetaminophen (TYLENOL) 325 MG tablet Take 2 tablets (650 mg total) by mouth every 6 (six) hours as needed for mild pain.    Marland Kitchen albuterol (PROVENTIL HFA;VENTOLIN HFA) 108 (90 BASE) MCG/ACT inhaler Inhale 2 puffs into the lungs every 6 (six) hours as needed for wheezing.    Marland Kitchen aspirin EC 325 MG EC tablet Take 1 tablet (325 mg total) by mouth 2 (two) times daily after a meal. 30 tablet 0  . atorvastatin (LIPITOR) 80 MG tablet TAKE 1 TABLET BY MOUTH EVERY DAY AT 6 PM 90 tablet 0  . azelastine (ASTELIN) 0.1 % nasal spray ONE TO TWO SPRAYS EACH NOSTRIL ONE TO TWO TIMES A DAY IF NEEDED FOR NASAL CONGESTION OR DRAINAGE. 30 mL 5  . b complex vitamins tablet Take 1 tablet by mouth daily.    Marland Kitchen buPROPion (WELLBUTRIN SR) 150 MG 12 hr tablet Take 150 mg by mouth.    . calcium  carbonate (TUMS - DOSED IN MG ELEMENTAL CALCIUM) 500 MG chewable tablet Chew 1 tablet by mouth as needed for indigestion or heartburn.    . Dextromethorphan-Guaifenesin (MUCINEX DM MAXIMUM STRENGTH) 60-1200 MG TB12 Take 1,200 mg by mouth.    . EPINEPHrine (EPIPEN 2-PAK) 0.3 mg/0.3 mL IJ SOAJ injection Inject 0.3 mLs (0.3 mg total) into the muscle once. 2 Device 2  . esomeprazole (NEXIUM) 20 MG capsule Take 20 mg by mouth as needed (for heartburn).    . fexofenadine (ALLEGRA) 60 MG tablet Take 60 mg by mouth 2 (two) times daily.    Marland Kitchen guaiFENesin (MUCINEX) 600 MG 12 hr tablet Take 1,200 mg by mouth 2 (two) times daily as needed for cough or to loosen phlegm.     . Liniments (BLUE-EMU SUPER STRENGTH) CREA Apply 1 application topically daily as needed (pain).    . memantine (NAMENDA) 10 MG tablet 1/2 DAILY FOR 1 WEEK THEN 1/2 BID FOR A WEEK THEN 1/2 IN AM  AND 1 IN PM FOR A WEEK THEN 1 BID    . Menthol, Topical Analgesic, (BIOFREEZE EX) Apply 1 application topically daily as needed (pain).    . metoprolol tartrate (LOPRESSOR) 25 MG tablet Take 0.5 tablets (12.5 mg total) by mouth 2 (two) times daily. 30 tablet  5  . montelukast (SINGULAIR) 10 MG tablet Take 1 tablet (10 mg total) by mouth at bedtime. 30 tablet 5  . MYRBETRIQ 25 MG TB24 tablet   10  . NASONEX 50 MCG/ACT nasal spray Place 2 sprays into the nose as needed (for congestion).   4  . nitroGLYCERIN (NITROSTAT) 0.4 MG SL tablet Place 1 tablet (0.4 mg total) under the tongue every 5 (five) minutes x 3 doses as needed for chest pain. 25 tablet 12  . Olopatadine HCl 0.6 % SOLN Place 1 Bottle into both nostrils as needed. Sinus irrigation  4  . Omega-3 1000 MG CAPS Take 554 mg by mouth.    Marland Kitchen omeprazole (PRILOSEC) 40 MG capsule Take 40 mg by mouth as needed (for heartburn).     Vladimir Faster Glycol-Propyl Glycol (SYSTANE OP) Place 1 drop into both eyes as needed (for dry eyes).    . venlafaxine XR (EFFEXOR-XR) 150 MG 24 hr capsule Take 150 mg by mouth  daily.  2  . vitamin B-12 (CYANOCOBALAMIN) 100 MCG tablet Take 100 mcg by mouth daily.    . cefdinir (OMNICEF) 300 MG capsule Take 2 Capsules Every 24 Hours for 10 Days. (Patient not taking: Reported on 06/13/2015) 20 capsule 0  . HYDROcodone-acetaminophen (NORCO/VICODIN) 5-325 MG per tablet Take 1-2 tablets by mouth every 4 (four) hours as needed (breakthrough pain). (Patient not taking: Reported on 06/13/2015) 50 tablet 0  . methocarbamol (ROBAXIN) 500 MG tablet Take 1 tablet (500 mg total) by mouth every 6 (six) hours as needed for muscle spasms. (Patient not taking: Reported on 06/13/2015) 50 tablet 0  . metoprolol tartrate (LOPRESSOR) 25 MG tablet TAKE 1/2 TABLET BY MOUTH TWICE DAILY (Patient not taking: Reported on 06/13/2015) 90 tablet 3  . oxyCODONE-acetaminophen (PERCOCET/ROXICET) 5-325 MG tablet Reported on 06/13/2015  0  . Psyllium 48.57 % POWD Take 2 packets by mouth. Reported on 06/13/2015    . tamsulosin (FLOMAX) 0.4 MG CAPS capsule Take 1 capsule (0.4 mg total) by mouth daily. (Patient not taking: Reported on 06/13/2015) 30 capsule 0  . VESICARE 5 MG tablet Take 5 mg by mouth daily. Reported on 06/13/2015  10   Facility-Administered Medications Prior to Visit  Medication Dose Route Frequency Provider Last Rate Last Dose  . predniSONE (DELTASONE) tablet 10 mg  10 mg Oral UD Adelina Mings, MD         Allergies:   Penicillins; Donepezil; Methocarbamol; and Bee venom   Past Medical History  Diagnosis Date  . GERD (gastroesophageal reflux disease)   . Constipation   . Seasonal allergies   . Asthma   . Renal insufficiency   . Anginal pain ([place])     3/16  . Heart murmur   . Hypercholesterolemia   . Coronary artery disease   . Irregular heart beat     "I inherited a 4th skip from my mother"  . OSA on CPAP   . Chronic sinusitis   . Chronic bronchitis ([place])     "got it q yr til this year" (09/24/2014)  . Degenerative arthritis   . Arthritis     "bad; all over" (09/24/2014)  .  Chronic lower back pain   . Anxiety   . Depression   . Basal cell carcinoma     "cut and burned off  top of head and face"    Past Surgical History  Procedure Laterality Date  . Total hip arthroplasty Bilateral 1989-1995    "right-left"  . Anterior  cervical discectomy  1989  . Lumbar laminectomy/decompression microdiscectomy Bilateral 12/04/2012    Procedure: BILATERAL LUMBAR LAMINECTOMY/DECOMPRESSION MICRODISCECTOMY LUMBAR FOUR-TO FIVE SACRALONE;  Surgeon: Elaina Hoops, MD;  Location: [facility];  Service: Neurosurgery;  Laterality: Bilateral;  . Cholecystectomy    . Left heart catheterization with coronary angiogram N/A 12/10/2013    Procedure: LEFT HEART CATHETERIZATION WITH CORONARY ANGIOGRAM;  Surgeon: Leonie Man, MD;  Location: [facility];  Service: Cardiovascular;  Laterality: N/A;  . Coronary angioplasty with stent placement  ~ 1992  . Joint replacement    . Back surgery    . Eye muscle surgery Right 2013  . Appendectomy  1967  . Shoulder arthroscopy Bilateral     bone spurs  . Colonoscopy    . Total knee arthroplasty Left 09/24/2014  . Revision total hip arthroplasty Right 2002    "changed a ball"  . Anterior cervical decomp/discectomy fusion  1998  . Cataract extraction w/ intraocular lens  implant, bilateral Bilateral 2013  . Tonsillectomy  ~ 1947  . Carpal tunnel release Bilateral 1980's  . Basal cell carcinoma excision      "top of my head"  . Total knee arthroplasty Left 09/24/2014    Procedure: TOTAL KNEE ARTHROPLASTY;  Surgeon: Melrose Nakayama, MD;  Location: [facility];  Service: Orthopedics;  Laterality: Left;     Social History:  The patient  reports that he quit smoking about 55 years ago. His smoking use included Cigarettes. He has a 4 pack-year smoking history. He has never used smokeless tobacco. He reports that he does not drink alcohol or use illicit drugs.   Family History:  The patient's family history includes Cancer in his brother and mother. There  is no history of Heart attack, Stroke, or Hypertension.    ROS:  Please see the history of present illness. All other systems are reviewed and  Negative to the above problem except as noted.    PHYSICAL EXAM: VS:  BP 112/60 mmHg  Pulse 92  Ht 5\' 5"  (1.651 m)  Wt 240 lb (108.863 kg)  BMI 39.94 kg/m2  SpO2 95%  GEN: Morbidly obese 82 yo  in no acute distress HEENT: normal Neck: no JVD, carotid bruits, or masses Cardiac: RRR; no murmurs, rubs, or gallops,no edema  Respiratory:  clear to auscultation bilaterally, normal work of breathing GI: soft, nontender, nondistended, + BS  No hepatomegaly  MS: no deformity Moving all extremities   Skin: warm and dry, no rash Neuro:  Strength and sensation are intact Psych: euthymic mood, full affect   EKG:  EKG is not  ordered today.   Lipid Panel    Component Value Date/Time   CHOL 102 02/05/2014 0843   TRIG 89.0 02/05/2014 0843   HDL 38.40* 02/05/2014 0843   CHOLHDL 3 02/05/2014 0843   VLDL 17.8 02/05/2014 0843   LDLCALC 46 02/05/2014 0843      Wt Readings from Last 3 Encounters:  06/13/15 240 lb (108.863 kg)  05/14/15 225 lb (102.059 kg)  01/06/15 242 lb (109.77 kg)      ASSESSMENT AND PLAN: 1  CAD  No symptoms ot angina  2. HL  Will have him get Dr Valora Piccolo to send labs when they are done in July  He ate today  Switch RX to 40 x 2 lipitor  Follow    3  HTN  Good control      Signed, Dorris Carnes, MD  06/13/2015 9:23 AM    [facility] [address], [place], [place]  [address] Phone: [phone]; Fax: [phone]

## 2015-06-16 ENCOUNTER — Ambulatory Visit (INDEPENDENT_AMBULATORY_CARE_PROVIDER_SITE_OTHER): Payer: Medicare Other

## 2015-06-16 DIAGNOSIS — J309 Allergic rhinitis, unspecified: Secondary | ICD-10-CM

## 2015-07-10 ENCOUNTER — Ambulatory Visit (INDEPENDENT_AMBULATORY_CARE_PROVIDER_SITE_OTHER): Payer: Medicare Other | Admitting: *Deleted

## 2015-07-10 DIAGNOSIS — J309 Allergic rhinitis, unspecified: Secondary | ICD-10-CM | POA: Diagnosis not present

## 2015-07-15 DIAGNOSIS — J3089 Other allergic rhinitis: Secondary | ICD-10-CM | POA: Diagnosis not present

## 2015-07-16 DIAGNOSIS — J301 Allergic rhinitis due to pollen: Secondary | ICD-10-CM | POA: Diagnosis not present

## 2015-07-28 ENCOUNTER — Ambulatory Visit (INDEPENDENT_AMBULATORY_CARE_PROVIDER_SITE_OTHER): Payer: Medicare Other

## 2015-07-28 DIAGNOSIS — J309 Allergic rhinitis, unspecified: Secondary | ICD-10-CM

## 2015-08-25 ENCOUNTER — Ambulatory Visit (INDEPENDENT_AMBULATORY_CARE_PROVIDER_SITE_OTHER): Payer: Medicare Other

## 2015-08-25 DIAGNOSIS — J309 Allergic rhinitis, unspecified: Secondary | ICD-10-CM | POA: Diagnosis not present

## 2015-09-15 ENCOUNTER — Ambulatory Visit (INDEPENDENT_AMBULATORY_CARE_PROVIDER_SITE_OTHER): Payer: Medicare Other

## 2015-09-15 DIAGNOSIS — J309 Allergic rhinitis, unspecified: Secondary | ICD-10-CM | POA: Diagnosis not present

## 2015-10-07 ENCOUNTER — Ambulatory Visit (INDEPENDENT_AMBULATORY_CARE_PROVIDER_SITE_OTHER): Payer: Medicare Other

## 2015-10-07 DIAGNOSIS — J309 Allergic rhinitis, unspecified: Secondary | ICD-10-CM | POA: Diagnosis not present

## 2015-10-21 ENCOUNTER — Ambulatory Visit: Payer: Self-pay

## 2015-10-22 ENCOUNTER — Ambulatory Visit (INDEPENDENT_AMBULATORY_CARE_PROVIDER_SITE_OTHER): Payer: Medicare Other

## 2015-10-22 DIAGNOSIS — J309 Allergic rhinitis, unspecified: Secondary | ICD-10-CM

## 2015-11-05 ENCOUNTER — Ambulatory Visit (INDEPENDENT_AMBULATORY_CARE_PROVIDER_SITE_OTHER): Payer: Medicare Other

## 2015-11-05 DIAGNOSIS — J309 Allergic rhinitis, unspecified: Secondary | ICD-10-CM | POA: Diagnosis not present

## 2015-11-11 ENCOUNTER — Encounter: Payer: Self-pay | Admitting: Internal Medicine

## 2015-11-19 ENCOUNTER — Ambulatory Visit (INDEPENDENT_AMBULATORY_CARE_PROVIDER_SITE_OTHER): Payer: Medicare Other

## 2015-11-19 DIAGNOSIS — J309 Allergic rhinitis, unspecified: Secondary | ICD-10-CM | POA: Diagnosis not present

## 2015-11-29 ENCOUNTER — Emergency Department (HOSPITAL_BASED_OUTPATIENT_CLINIC_OR_DEPARTMENT_OTHER)
Admission: EM | Admit: 2015-11-29 | Discharge: 2015-11-29 | Disposition: A | Discharge status: Home / Self Care | Payer: Medicare Other | Attending: Emergency Medicine | Admitting: Emergency Medicine

## 2015-11-29 ENCOUNTER — Emergency Department (HOSPITAL_BASED_OUTPATIENT_CLINIC_OR_DEPARTMENT_OTHER): Payer: Medicare Other

## 2015-11-29 ENCOUNTER — Encounter (HOSPITAL_BASED_OUTPATIENT_CLINIC_OR_DEPARTMENT_OTHER): Payer: Self-pay | Admitting: *Deleted

## 2015-11-29 DIAGNOSIS — Z87891 Personal history of nicotine dependence: Secondary | ICD-10-CM | POA: Insufficient documentation

## 2015-11-29 DIAGNOSIS — R0781 Pleurodynia: Secondary | ICD-10-CM | POA: Diagnosis present

## 2015-11-29 DIAGNOSIS — Y999 Unspecified external cause status: Secondary | ICD-10-CM | POA: Insufficient documentation

## 2015-11-29 DIAGNOSIS — R0789 Other chest pain: Secondary | ICD-10-CM | POA: Insufficient documentation

## 2015-11-29 DIAGNOSIS — J45909 Unspecified asthma, uncomplicated: Secondary | ICD-10-CM | POA: Insufficient documentation

## 2015-11-29 DIAGNOSIS — Y939 Activity, unspecified: Secondary | ICD-10-CM | POA: Diagnosis not present

## 2015-11-29 DIAGNOSIS — Z7982 Long term (current) use of aspirin: Secondary | ICD-10-CM | POA: Diagnosis not present

## 2015-11-29 DIAGNOSIS — Z79899 Other long term (current) drug therapy: Secondary | ICD-10-CM | POA: Insufficient documentation

## 2015-11-29 DIAGNOSIS — I2510 Atherosclerotic heart disease of native coronary artery without angina pectoris: Secondary | ICD-10-CM | POA: Insufficient documentation

## 2015-11-29 DIAGNOSIS — Y929 Unspecified place or not applicable: Secondary | ICD-10-CM | POA: Insufficient documentation

## 2015-11-29 DIAGNOSIS — Z85828 Personal history of other malignant neoplasm of skin: Secondary | ICD-10-CM | POA: Diagnosis not present

## 2015-11-29 DIAGNOSIS — W1830XA Fall on same level, unspecified, initial encounter: Secondary | ICD-10-CM | POA: Diagnosis not present

## 2015-11-29 DIAGNOSIS — I10 Essential (primary) hypertension: Secondary | ICD-10-CM | POA: Diagnosis not present

## 2015-11-29 MED ORDER — OXYCODONE HCL 5 MG PO TABS: 2.5000 mg | ORAL_TABLET | ORAL | 0 refills | Status: DC | PRN

## 2015-11-29 MED ORDER — OXYCODONE HCL 5 MG PO TABS
5.0000 mg | ORAL_TABLET | Freq: Once | ORAL | Status: AC
  Administered 2015-11-29: 5 mg via ORAL
  Filled 2015-11-29: qty 1

## 2015-11-29 NOTE — ED Notes (Signed)
Pt transported to XR.  

## 2015-11-29 NOTE — ED Triage Notes (Signed)
Pt c/o fall from standing landing on left ribs  X 1 hr ago

## 2015-11-29 NOTE — ED Provider Notes (Signed)
`1 [facility] Provider Note   CSN: [id number] Arrival date & time: 11/29/15  1439  By signing my name below, I, Reola Mosher, attest that this documentation has been prepared under the direction and in the presence of Deno Etienne, DO. Electronically Signed: Reola Mosher, ED Scribe. 11/29/15. 3:32 PM.  History   Chief Complaint Chief Complaint  Patient presents with  . Fall   The history is provided by the patient. No language interpreter was used.  Fall  This is a new problem. The current episode started 1 to 2 hours ago. The problem occurs constantly. The problem has been gradually worsening. Pertinent negatives include no chest pain, no abdominal pain, no headaches and no shortness of breath. Exacerbated by: sitting forward. Nothing relieves the symptoms. He has tried acetaminophen for the symptoms. The treatment provided no relief.    HPI Comments: Larry Church is a 82 y.o. male who presents to the Emergency Department complaining of sudden onset, left-sided ribcage pain s/p ground-level, mechanical fall that occurred just PTA. Pt reports that he was walking, when he tripped and fell over the corner of a couch onto his left side, sustaining his current pain. No LOC or head injury. His pain is exacerbated with sitting forward. He tried taking a dosage of Tylenol prior to coming into the ED with minimal relief of his pain. Denies neck pain, abdominal pain, SOB, lower extremity pain, or any other associated symptoms or injuries.    Past Medical History:  Diagnosis Date  . Anginal pain ([place])    3/16  . Anxiety   . Arthritis    "bad; all over" (09/24/2014)  . Asthma   . Basal cell carcinoma    "cut and burned off  top of head and face"  . Chronic bronchitis ([place])    "got it q yr til this year" (09/24/2014)  . Chronic lower back pain   . Chronic sinusitis   . Constipation   . Coronary artery disease   . Degenerative arthritis   . Depression   . GERD  (gastroesophageal reflux disease)   . Heart murmur   . Hypercholesterolemia   . Irregular heart beat    "I inherited a 4th skip from my mother"  . OSA on CPAP   . Renal insufficiency   . Seasonal allergies    Patient Active Problem List   Diagnosis Date Noted  . Mild intermittent asthma 01/16/2015  . Obstructive sleep apnea treated with bilevel positive airway pressure (BiPAP) 01/16/2015  . Gastroesophageal reflux disease without esophagitis 01/16/2015  . Current use of beta blocker 01/16/2015  . Acute maxillary sinusitis 01/16/2015  . Acute sinusitis 01/06/2015  . Coughing 01/06/2015  . Acute bronchitis 01/06/2015  . Primary osteoarthritis of left knee 09/24/2014  . Primary osteoarthritis of knee 09/24/2014  . Traumatic subarachnoid hemorrhage ([place]) 02/20/2014  . Fall 02/19/2014  . CAD (coronary artery disease) 12/25/2013  . Cardiomyopathy, ischemic 12/25/2013  . Essential hypertension 12/25/2013  . Obesity, Class III, BMI 40-49.9 (morbid obesity) ([place]) 12/25/2013  . Dyslipidemia 12/11/2013  . Unstable angina ([place]) 12/10/2013  . Fatigue 10/31/2013  . Screening for lipoid disorders 10/31/2013  . Obstructive apnea 10/31/2013  . Cervical pain 09/26/2013  . Beat, premature ventricular 09/13/2013  . Allergic rhinitis 09/10/2013  . Chronic LBP 09/10/2013  . 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 DDD (degenerative disc disease), lumbosacral 09/10/2013  . Acid reflux 09/10/2013  . History of colon polyps 09/10/2013  . Lumbar canal  stenosis 09/10/2013  . Depression, major, recurrent ([place]) 09/10/2013  . Arthritis, degenerative 09/10/2013  . Atrophic kidney 09/10/2013  . Compressed spine fracture ([place]) 09/10/2013   Past Surgical History:  Procedure Laterality Date  . ANTERIOR CERVICAL DECOMP/DISCECTOMY FUSION  1998  . ANTERIOR CERVICAL DISCECTOMY  1989  . APPENDECTOMY  1967  . BACK SURGERY    . BASAL CELL CARCINOMA EXCISION     "top of my head"  . CARPAL TUNNEL RELEASE Bilateral  1980's  . CATARACT EXTRACTION W/ INTRAOCULAR LENS  IMPLANT, BILATERAL Bilateral 2013  . CHOLECYSTECTOMY    . COLONOSCOPY    . CORONARY ANGIOPLASTY WITH STENT PLACEMENT  ~ 1992  . EYE MUSCLE SURGERY Right 2013  . JOINT REPLACEMENT    . LEFT HEART CATHETERIZATION WITH CORONARY ANGIOGRAM N/A 12/10/2013   Procedure: LEFT HEART CATHETERIZATION WITH CORONARY ANGIOGRAM;  Surgeon: Leonie Man, MD;  Location[facility] CATH LAB;  Service: Cardiovascular;  Laterality: N/A;  . LUMBAR LAMINECTOMY/DECOMPRESSION MICRODISCECTOMY Bilateral 12/04/2012   Procedure: BILATERAL LUMBAR LAMINECTOMY/DECOMPRESSION MICRODISCECTOMY LUMBAR FOUR-TO FIVE SACRALONE;  Surgeon: Elaina Hoops, MD;  Location: [facility]UR[facility] ORS;  Service: Neurosurgery;  Laterality: Bilateral;  . REVISION TOTAL HIP ARTHROPLASTY Right 2002   "changed a ball"  . SHOULDER ARTHROSCOPY Bilateral    bone spurs  . TONSILLECTOMY  ~ 1947  . TOTAL HIP ARTHROPLASTY Bilateral 1989-1995   "right-left"  . TOTAL KNEE ARTHROPLASTY Left 09/24/2014  . TOTAL KNEE ARTHROPLASTY Left 09/24/2014   Procedure: TOTAL KNEE ARTHROPLASTY;  Surgeon: Melrose Nakayama, MD;  Location: [facility];  Service: Orthopedics;  Laterality: Left;    Home Medications    Prior to Admission medications   Medication Sig Start Date End Date Taking? Authorizing Provider  acetaminophen (TYLENOL) 325 MG tablet Take 2 tablets (650 mg total) by mouth every 6 (six) hours as needed for mild pain. 02/21/14   Delfina Redwood, MD  albuterol (PROVENTIL HFA;VENTOLIN HFA) 108 (90 BASE) MCG/ACT inhaler Inhale 2 puffs into the lungs every 6 (six) hours as needed for wheezing.    Historical Provider, MD  aspirin EC 325 MG EC tablet Take 1 tablet (325 mg total) by mouth 2 (two) times daily after a meal. 09/27/14   Loni Dolly, PA-C  atorvastatin (LIPITOR) 80 MG tablet Take 1 tablet (80 mg total) by mouth daily. 06/13/15   Fay Records, MD  azelastine (ASTELIN) 0.1 % nasal spray ONE TO TWO SPRAYS EACH NOSTRIL ONE TO TWO  TIMES A DAY IF NEEDED FOR NASAL CONGESTION OR DRAINAGE. 01/06/15   Adelina Mings, MD  b complex vitamins tablet Take 1 tablet by mouth daily.    Historical Provider, MD  buPROPion (WELLBUTRIN SR) 150 MG 12 hr tablet Take 150 mg by mouth. 12/12/14 12/12/15  Historical Provider, MD  calcium carbonate (TUMS - DOSED IN MG ELEMENTAL CALCIUM) 500 MG chewable tablet Chew 1 tablet by mouth as needed for indigestion or heartburn.    Historical Provider, MD  Dextromethorphan-Guaifenesin (MUCINEX DM MAXIMUM STRENGTH) 60-1200 MG TB12 Take 1,200 mg by mouth.    Historical Provider, MD  EPINEPHrine (EPIPEN 2-PAK) 0.3 mg/0.3 mL IJ SOAJ injection Inject 0.3 mLs (0.3 mg total) into the muscle once. 11/25/14   Charlies Silvers, MD  esomeprazole (NEXIUM) 20 MG capsule Take 20 mg by mouth as needed (for heartburn).    Historical Provider, MD  fexofenadine (ALLEGRA) 60 MG tablet Take 60 mg by mouth 2 (two) times daily.    Historical Provider, MD  finasteride (PROSCAR)  5 MG tablet Take 5 mg by mouth daily.    Historical Provider, MD  guaiFENesin (MUCINEX) 600 MG 12 hr tablet Take 1,200 mg by mouth 2 (two) times daily as needed for cough or to loosen phlegm.     Historical Provider, MD  Liniments (BLUE-EMU SUPER STRENGTH) CREA Apply 1 application topically daily as needed (pain).    Historical Provider, MD  memantine (NAMENDA) 10 MG tablet 1/2 DAILY FOR 1 WEEK THEN 1/2 BID FOR A WEEK THEN 1/2 IN AM  AND 1 IN PM FOR A WEEK THEN 1 BID 12/11/14   Historical Provider, MD  Menthol, Topical Analgesic, (BIOFREEZE EX) Apply 1 application topically daily as needed (pain).    Historical Provider, MD  metoprolol tartrate (LOPRESSOR) 25 MG tablet Take 0.5 tablets (12.5 mg total) by mouth 2 (two) times daily. 12/09/14   Imogene Burn, PA-C  montelukast (SINGULAIR) 10 MG tablet Take 1 tablet (10 mg total) by mouth at bedtime. 01/16/15   Charlies Silvers, MD  Multiple Vitamin (MULTIVITAMIN) tablet Take 1 tablet by mouth daily.     Historical Provider, MD  MYRBETRIQ 25 MG TB24 tablet  12/27/14   Historical Provider, MD  NASONEX 50 MCG/ACT nasal spray Place 2 sprays into the nose as needed (for congestion).  01/25/14   Historical Provider, MD  nitroGLYCERIN (NITROSTAT) 0.4 MG SL tablet Place 1 tablet (0.4 mg total) under the tongue every 5 (five) minutes x 3 doses as needed for chest pain. 12/11/13   Brett Canales, PA-C  Olopatadine HCl 0.6 % SOLN Place 1 Bottle into both nostrils as needed. Sinus irrigation 01/25/14   Historical Provider, MD  Omega-3 1000 MG CAPS Take 554 mg by mouth.    Historical Provider, MD  omeprazole (PRILOSEC) 40 MG capsule Take 40 mg by mouth as needed (for heartburn).     Historical Provider, MD  oxyCODONE (ROXICODONE) 5 MG immediate release tablet Take 0.5 tablets (2.5 mg total) by mouth every 4 (four) hours as needed for severe pain. 11/29/15   Deno Etienne, DO  Polyethyl Glycol-Propyl Glycol (SYSTANE OP) Place 1 drop into both eyes as needed (for dry eyes).    Historical Provider, MD  venlafaxine XR (EFFEXOR-XR) 150 MG 24 hr capsule Take 150 mg by mouth daily. 02/27/14   Historical Provider, MD  vitamin B-12 (CYANOCOBALAMIN) 100 MCG tablet Take 100 mcg by mouth daily.    Historical Provider, MD   Family History Family History  Problem Relation Age of Onset  . Cancer Mother   . Cancer Brother   . Heart attack Neg Hx   . Stroke Neg Hx   . Hypertension Neg Hx    Social History Social History  Substance Use Topics  . Smoking status: Former Smoker    Packs/day: 1.00    Years: 4.00    Types: Cigarettes    Quit date: 02/16/1960  . Smokeless tobacco: Never Used  . Alcohol use No   Allergies   Penicillins; Donepezil; Methocarbamol; and Bee venom  Review of Systems Review of Systems  Constitutional: Negative for chills and fever.  HENT: Negative for congestion and facial swelling.   Eyes: Negative for discharge and visual disturbance.  Respiratory: Negative for shortness of breath.     Cardiovascular: Negative for chest pain and palpitations.  Gastrointestinal: Negative for abdominal pain, diarrhea and vomiting.  Musculoskeletal: Positive for arthralgias (left-sided ribcage) and myalgias. Negative for neck pain.  Skin: Negative for color change and rash.  Neurological: Negative for  tremors, syncope and headaches.  Psychiatric/Behavioral: Negative for confusion and dysphoric mood.  All other systems reviewed and are negative.  Physical Exam Updated Vital Signs BP 145/93   Pulse 77   Temp 97.6 F (36.4 C) (Oral)   Resp 20   Ht 5' 5.5" (1.664 m)   Wt 225 lb (102.1 kg)   SpO2 100%   BMI 36.87 kg/m   Physical Exam  Constitutional: He is oriented to person, place, and time. He appears well-developed and well-nourished.  HENT:  Head: Normocephalic and atraumatic.  Eyes: EOM are normal. Pupils are equal, round, and reactive to light.  Neck: Normal range of motion. Neck supple. No JVD present.  Cardiovascular: Normal rate and regular rhythm.  Exam reveals no gallop and no friction rub.   No murmur heard. Pulmonary/Chest: No respiratory distress. He has no wheezes.  Abdominal: He exhibits no distension. There is no rebound and no guarding.  Musculoskeletal: Normal range of motion. He exhibits tenderness.  Focal tenderness to the midaxillary line on the left about rib 11 and 12.   Neurological: He is alert and oriented to person, place, and time.  Skin: No rash noted. No pallor.  Psychiatric: He has a normal mood and affect. His behavior is normal.  Nursing note and vitals reviewed.  ED Treatments / Results  DIAGNOSTIC STUDIES: Oxygen Saturation is 95% on RA, adeqaute by my interpretation.   COORDINATION OF CARE: 3:32 PM-Discussed next steps with pt. Pt verbalized understanding and is agreeable with the plan.   Labs (all labs ordered are listed, but only abnormal results are displayed) Labs Reviewed - No data to display  EKG  EKG Interpretation None       Radiology Dg Ribs Unilateral W/chest Left  Result Date: 11/29/2015 CLINICAL DATA:  Pain after fall.  Left lower rib pain. EXAM: LEFT RIBS AND CHEST - 3+ VIEW COMPARISON:  Chest x-ray May 01, 2014 and CT scan from the same day FINDINGS: No pneumothorax. Healed left-sided rib fractures. The cardiomediastinal silhouette is stable. Reticular changes in the lungs are chronic. No focal infiltrate. No nodule or mass. No acute rib fractures are seen. IMPRESSION: No acute left rib fractures. Healed chronic left rib fractures. No pneumothorax. Electronically Signed   By: Dorise Bullion III M.D   On: 11/29/2015 15:37    Procedures Procedures (including critical care time)  Medications Ordered in ED Medications  oxyCODONE (Oxy IR/ROXICODONE) immediate release tablet 5 mg (5 mg Oral Given 11/29/15 1622)    Initial Impression / Assessment and Plan / ED Course  I have reviewed the triage vital signs and the nursing notes.  Pertinent labs & imaging results that were available during my care of the patient were reviewed by me and considered in my medical decision making (see chart for details).  Clinical Course   82 yo M with a chief complaint of left-sided chest wall pain. This occurred after he fell and landed with his left side against a couch. Fall was mechanical. On my exam patient with focal tenderness to that area. X-rays negative for displaced fracture. Patient is well-appearing and nontoxic. We'll have him go home with spirometry PCP follow-up.  5:51 PM:  I have discussed the diagnosis/risks/treatment options with the patient and family and believe the pt to be eligible for discharge home to follow-up with PCP. We also discussed returning to the ED immediately if new or worsening sx occur. We discussed the sx which are most concerning (e.g., sudden worsening pain, fever, inability  to tolerate by mouth) that necessitate immediate return. Medications administered to the patient during their  visit and any new prescriptions provided to the patient are listed below.  Medications given during this visit Medications  oxyCODONE (Oxy IR/ROXICODONE) immediate release tablet 5 mg (5 mg Oral Given 11/29/15 1622)     The patient appears reasonably screen and/or stabilized for discharge and I doubt any other medical condition or other [facility] requiring further screening, evaluation, or treatment in the ED at this time prior to discharge.    Final Clinical Impressions(s) / ED Diagnoses   Final diagnoses:  Left-sided chest wall pain   New Prescriptions Discharge Medication List as of 11/29/2015  4:10 PM    START taking these medications   Details  oxyCODONE (ROXICODONE) 5 MG immediate release tablet Take 0.5 tablets (2.5 mg total) by mouth every 4 (four) hours as needed for severe pain., Starting Sat 11/29/2015, Print       I personally performed the services described in this documentation, which was scribed in my presence. The recorded information has been reviewed and is accurate.     Deno Etienne, DO 11/29/15 1751

## 2015-12-01 ENCOUNTER — Ambulatory Visit: Payer: Medicare Other | Admitting: Physician Assistant

## 2015-12-09 ENCOUNTER — Ambulatory Visit: Payer: Medicare Other | Admitting: Physician Assistant

## 2015-12-10 ENCOUNTER — Ambulatory Visit (INDEPENDENT_AMBULATORY_CARE_PROVIDER_SITE_OTHER): Payer: Medicare Other

## 2015-12-10 DIAGNOSIS — J309 Allergic rhinitis, unspecified: Secondary | ICD-10-CM | POA: Diagnosis not present

## 2015-12-17 ENCOUNTER — Encounter: Payer: Self-pay | Admitting: Physician Assistant

## 2015-12-17 ENCOUNTER — Other Ambulatory Visit: Payer: Self-pay | Admitting: Physician Assistant

## 2015-12-17 ENCOUNTER — Ambulatory Visit: Payer: Medicare Other | Admitting: Physician Assistant

## 2015-12-17 ENCOUNTER — Ambulatory Visit (INDEPENDENT_AMBULATORY_CARE_PROVIDER_SITE_OTHER): Payer: Medicare Other | Admitting: Physician Assistant

## 2015-12-17 VITALS — BP 102/60 | HR 79 | Ht 65.5 in | Wt 240.8 lb

## 2015-12-17 DIAGNOSIS — E7800 Pure hypercholesterolemia, unspecified: Secondary | ICD-10-CM

## 2015-12-17 DIAGNOSIS — I2510 Atherosclerotic heart disease of native coronary artery without angina pectoris: Secondary | ICD-10-CM

## 2015-12-17 DIAGNOSIS — M79605 Pain in left leg: Secondary | ICD-10-CM

## 2015-12-17 DIAGNOSIS — I10 Essential (primary) hypertension: Secondary | ICD-10-CM | POA: Diagnosis not present

## 2015-12-17 DIAGNOSIS — M79604 Pain in right leg: Secondary | ICD-10-CM | POA: Diagnosis not present

## 2015-12-17 DIAGNOSIS — R0989 Other specified symptoms and signs involving the circulatory and respiratory systems: Secondary | ICD-10-CM

## 2015-12-17 MED ORDER — ATORVASTATIN CALCIUM 40 MG PO TABS: 40.0000 mg | ORAL_TABLET | Freq: Every day | ORAL | 3 refills | Status: DC

## 2015-12-17 NOTE — Patient Instructions (Addendum)
Medication Instructions:  1. DECREASE LIPITOR TO 40 MG DAILY; NEW RX HAS BEEN SENT IN  Labwork: 1. IN 3  MONTHS YOU WILL NEED A FASTING CHOLESTEROL PANEL TO BE DONE  Testing/Procedures: YOU WILL NEED TO SCHEDULE A LOWER EXTREMITY ARTERIAL DUPLEX TO BE DONE WITH ABI'S   Follow-Up: Your physician wants you to follow-up in: [place] DR. ROSS You will receive a reminder letter in the mail two months in advance. If you don't receive a letter, please call our office to schedule the follow-up appointment.  Any Other Special Instructions Will Be Listed Below (If Applicable).  If you need a refill on your cardiac medications before your next appointment, please call your pharmacy.

## 2015-12-17 NOTE — Progress Notes (Signed)
Cardiology Office Note:    Date:  12/17/2015   ID:  Larry Church, DOB 04-Mar-1932, MRN [id number]  PCP:  Lilian Coma., MD  Cardiologist:  Dr. Dorris Carnes   Electrophysiologist:  n/a  Referring MD: Lilian Coma., MD   Chief Complaint  Patient presents with  . Follow-up    CAD    History of Present Illness:    Larry Church is a 82 y.o. male with a hx of CAD, HTN, HL, PVCs, OSA, obesity, prior fall c/b subarachnoid hemorrhage, DJD s/p L TKR in 8/16.  LHC in 10/15 demonstrated non-obstructive disease.  EF was 40-50% by echo in 10/15.   Last seen in 4/17.  He is here with his wife today. Overall, he has been doing well. He denies chest pain. He denies significant dyspnea. He denies orthopnea, PND or edema. He is fairly sedentary secondary to chronic back issues. He does feel fatigued. He and his wife question why is on such a high-dose of Lipitor. He notes some leg pain from time to time. He denies any nonhealing sores on his feet.  Prior CV studies that were reviewed today include:    Echo 10/15 Mod LVH, EF 40-50, no apical clot, aortic sclerosis, MAC, trivial MR, mild LAE  LHC 10/15 LAD prox 20-30, dist 50, D1 40 at takeoff LCx ostial 40, o/w irregs RI ostial 20 then min irregs RCA diff irregs 10-20  Past Medical History:  Diagnosis Date  . Anxiety   . Arthritis    "bad; all over" (09/24/2014)  . Asthma   . Basal cell carcinoma    "cut and burned off  top of head and face"  . Chronic bronchitis ([place])    "got it q yr til this year" (09/24/2014)  . Chronic lower back pain   . Chronic sinusitis   . Coronary artery disease    LHC 10/15: pLAD 20-30, dLAD 50, origin D1 40; oLCx 40 then irregs; oRI 20, RCA diff irregs up to 10-20  . Degenerative arthritis   . Depression   . GERD (gastroesophageal reflux disease)   . Hypercholesterolemia   . LV dysfunction    Echo 10/15: Mod LVH, EF 40-50, no apical clot, aortic sclerosis, MAC, trivial MR, mild LAE  . OSA on CPAP   .  PVC's (premature ventricular contractions)    "I inherited a 4th skip from my mother"  . Seasonal allergies     Past Surgical History:  Procedure Laterality Date  . ANTERIOR CERVICAL DECOMP/DISCECTOMY FUSION  1998  . ANTERIOR CERVICAL DISCECTOMY  1989  . APPENDECTOMY  1967  . BACK SURGERY    . BASAL CELL CARCINOMA EXCISION     "top of my head"  . CARPAL TUNNEL RELEASE Bilateral 1980's  . CATARACT EXTRACTION W/ INTRAOCULAR LENS  IMPLANT, BILATERAL Bilateral 2013  . CHOLECYSTECTOMY    . COLONOSCOPY    . CORONARY ANGIOPLASTY WITH STENT PLACEMENT  ~ 1992  . EYE MUSCLE SURGERY Right 2013  . JOINT REPLACEMENT    . LEFT HEART CATHETERIZATION WITH CORONARY ANGIOGRAM N/A 12/10/2013   Procedure: LEFT HEART CATHETERIZATION WITH CORONARY ANGIOGRAM;  Surgeon: Leonie Man, MD;  Location: [facility] CATH LAB;  Service: Cardiovascular;  Laterality: N/A;  . LUMBAR LAMINECTOMY/DECOMPRESSION MICRODISCECTOMY Bilateral 12/04/2012   Procedure: BILATERAL LUMBAR LAMINECTOMY/DECOMPRESSION MICRODISCECTOMY LUMBAR FOUR-TO FIVE SACRALONE;  Surgeon: Elaina Hoops, MD;  Location: [facility];  Service: Neurosurgery;  Laterality: Bilateral;  . REVISION TOTAL HIP ARTHROPLASTY Right 2002   "  changed a ball"  . SHOULDER ARTHROSCOPY Bilateral    bone spurs  . TONSILLECTOMY  ~ 1947  . TOTAL HIP ARTHROPLASTY Bilateral 1989-1995   "right-left"  . TOTAL KNEE ARTHROPLASTY Left 09/24/2014  . TOTAL KNEE ARTHROPLASTY Left 09/24/2014   Procedure: TOTAL KNEE ARTHROPLASTY;  Surgeon: Melrose Nakayama, MD;  Location: [place];  Service: Orthopedics;  Laterality: Left;    Current Medications: Current Meds  Medication Sig  . acetaminophen (TYLENOL) 325 MG tablet Take 2 tablets (650 mg total) by mouth every 6 (six) hours as needed for mild pain.  Marland Kitchen albuterol (PROVENTIL HFA;VENTOLIN HFA) 108 (90 BASE) MCG/ACT inhaler Inhale 2 puffs into the lungs every 6 (six) hours as needed for wheezing.  Marland Kitchen aspirin EC 325 MG EC tablet Take 1 tablet (325 mg  total) by mouth 2 (two) times daily after a meal.  . azelastine (ASTELIN) 0.1 % nasal spray ONE TO TWO SPRAYS EACH NOSTRIL ONE TO TWO TIMES A DAY IF NEEDED FOR NASAL CONGESTION OR DRAINAGE.  Marland Kitchen b complex vitamins tablet Take 1 tablet by mouth daily.  . calcium carbonate (TUMS - DOSED IN MG ELEMENTAL CALCIUM) 500 MG chewable tablet Chew 1 tablet by mouth as needed for indigestion or heartburn.  . Dextromethorphan-Guaifenesin (MUCINEX DM MAXIMUM STRENGTH) 60-1200 MG TB12 Take 1,200 mg by mouth daily as needed (CONGESTION).   Marland Kitchen EPINEPHrine (EPIPEN 2-PAK) 0.3 mg/0.3 mL IJ SOAJ injection Inject 0.3 mLs (0.3 mg total) into the muscle once.  Marland Kitchen esomeprazole (NEXIUM) 20 MG capsule Take 20 mg by mouth as needed (for heartburn).  . finasteride (PROSCAR) 5 MG tablet Take 5 mg by mouth daily.  Marland Kitchen guaiFENesin (MUCINEX) 600 MG 12 hr tablet Take 1,200 mg by mouth 2 (two) times daily as needed for cough or to loosen phlegm.   Marland Kitchen levocetirizine (XYZAL) 5 MG tablet Take 5 mg by mouth daily.  . Liniments (BLUE-EMU SUPER STRENGTH) CREA Apply 1 application topically daily as needed (pain).  . memantine (NAMENDA) 10 MG tablet 1/2 DAILY FOR 1 WEEK THEN 1/2 BID FOR A WEEK THEN 1/2 IN AM  AND 1 IN PM FOR A WEEK THEN 1 BID  . Menthol, Topical Analgesic, (BIOFREEZE EX) Apply 1 application topically daily as needed (pain).  . metoprolol tartrate (LOPRESSOR) 25 MG tablet Take 0.5 tablets (12.5 mg total) by mouth 2 (two) times daily.  . montelukast (SINGULAIR) 10 MG tablet Take 1 tablet (10 mg total) by mouth at bedtime.  . Multiple Vitamin (MULTIVITAMIN) tablet Take 1 tablet by mouth daily.  Marland Kitchen MYRBETRIQ 25 MG TB24 tablet Take 25 mg by mouth daily.   Marland Kitchen NASONEX 50 MCG/ACT nasal spray Place 2 sprays into the nose as needed (for congestion).   . nitroGLYCERIN (NITROSTAT) 0.4 MG SL tablet Place 1 tablet (0.4 mg total) under the tongue every 5 (five) minutes x 3 doses as needed for chest pain.  Marland Kitchen Olopatadine HCl 0.6 % SOLN Place 1  Bottle into both nostrils as needed. Sinus irrigation  . Omega-3 1000 MG CAPS Take 554 mg by mouth daily.   Marland Kitchen oxyCODONE (ROXICODONE) 5 MG immediate release tablet Take 0.5 tablets (2.5 mg total) by mouth every 4 (four) hours as needed for severe pain.  Vladimir Faster Glycol-Propyl Glycol (SYSTANE OP) Place 1 drop into both eyes as needed (for dry eyes).  . venlafaxine XR (EFFEXOR-XR) 150 MG 24 hr capsule Take 150 mg by mouth daily.  Marland Kitchen venlafaxine XR (EFFEXOR-XR) 75 MG 24 hr capsule Take 75 mg by  mouth 2 (two) times daily.   . [DISCONTINUED] atorvastatin (LIPITOR) 80 MG tablet Take 1 tablet (80 mg total) by mouth daily.   Current Facility-Administered Medications for the 12/17/15 encounter (Office Visit) with Liliane Shi, PA-C  Medication  . predniSONE (DELTASONE) tablet 10 mg     Allergies:   Penicillins; Bee venom; Donepezil; and Methocarbamol   Social History   Social History  . Marital status: Married    Spouse name: N/A  . Number of children: N/A  . Years of education: N/A   Social History Main Topics  . Smoking status: Former Smoker    Packs/day: 1.00    Years: 4.00    Types: Cigarettes    Quit date: 02/16/1960  . Smokeless tobacco: Never Used  . Alcohol use No  . Drug use: No  . Sexual activity: Not Asked   Other Topics Concern  . None   Social History Narrative  . None     Family History:  The patient's family history includes Cancer in his brother and mother.   ROS:   Please see the history of present illness.    ROS All other systems reviewed and are negative.   EKGs/Labs/Other Test Reviewed:    EKG:  EKG is  ordered today.  The ekg ordered today demonstrates NSR, HR 79, left axis deviation, PAC, first-degree AV block with PR 244 ms, QTC 440 ms  Recent Labs: 05/14/2015: BUN 27; Creatinine, Ser 1.14; Hemoglobin 15.6; Platelets 261; Potassium 4.2; Sodium 135    Recent Lipid Panel    Component Value Date/Time   CHOL 102 02/05/2014 0843   TRIG 89.0  02/05/2014 0843   HDL 38.40 (L) 02/05/2014 0843   CHOLHDL 3 02/05/2014 0843   VLDL 17.8 02/05/2014 0843   LDLCALC 46 02/05/2014 0843     Physical Exam:    VS:  BP 102/60   Pulse 79   Ht 5' 5.5" (1.664 m)   Wt 240 lb 12.8 oz (109.2 kg)   BMI 39.46 kg/m     Wt Readings from Last 3 Encounters:  12/17/15 240 lb 12.8 oz (109.2 kg)  11/29/15 225 lb (102.1 kg)  06/13/15 240 lb (108.9 kg)     Physical Exam  Constitutional: He is oriented to person, place, and time. He appears well-developed and well-nourished. No distress.  HENT:  Head: Normocephalic and atraumatic.  Eyes: No scleral icterus.  Neck: No JVD present. Carotid bruit is not present.  Cardiovascular: Normal rate and regular rhythm.   No murmur heard. Pulses:      Dorsalis pedis pulses are 1+ on the right side, and 1+ on the left side.       Posterior tibial pulses are 1+ on the right side, and 1+ on the left side.  Pulmonary/Chest: Effort normal. He has no wheezes. He has no rales.  Abdominal: Soft. There is no tenderness.  Musculoskeletal: He exhibits no edema.  Neurological: He is alert and oriented to person, place, and time.  Skin: Skin is warm and dry.  Psychiatric: He has a normal mood and affect.    ASSESSMENT:    1. Coronary artery disease involving native coronary artery of native heart without angina pectoris   2. Essential hypertension   3. Pure hypercholesterolemia   4. Leg pain, bilateral    PLAN:    In order of problems listed above:  1. CAD - Heart cath in 2015 with mainly non-obstructive disease.  He has no hx of ACS.  He  denies anginal symptoms.  Continue ASA, statin, beta-blocker.   2. HTN - BP is controlled.  3. HL - Since he has no hx of ACS and no obstructive CAD, it is reasonable to maintain him on mod intensity statin.  Decrease Lipitor to 40 QD.  Lipids and LFTs in 3 mos.  4. Leg pain - Atypical for claudication.  But, his pulses are diminished and he is fairly sedentary. Symptoms  may be related to statin Rx as well. Decrease statin dose as noted.  Arrange ABIs.     Medication Adjustments/Labs and Tests Ordered: Current medicines are reviewed at length with the patient today.  Concerns regarding medicines are outlined above.  Medication changes, Labs and Tests ordered today are outlined in the Patient Instructions noted below. Patient Instructions  Medication Instructions:  1. DECREASE LIPITOR TO 40 MG DAILY; NEW RX HAS BEEN SENT IN  Labwork: 1. IN 3  MONTHS YOU WILL NEED A FASTING CHOLESTEROL PANEL TO BE DONE  Testing/Procedures: YOU WILL NEED TO SCHEDULE A LOWER EXTREMITY ARTERIAL DUPLEX TO BE DONE WITH ABI'S   Follow-Up: Your physician wants you to follow-up in: [place] DR. ROSS You will receive a reminder letter in the mail two months in advance. If you don't receive a letter, please call our office to schedule the follow-up appointment.  Any Other Special Instructions Will Be Listed Below (If Applicable).  If you need a refill on your cardiac medications before your next appointment, please call your pharmacy.  Signed, Richardson Dopp, PA-C  12/17/2015 4:16 PM    [facility] [place], [place], [place]  [address] Phone: [phone]; Fax: [phone]

## 2015-12-18 ENCOUNTER — Ambulatory Visit (HOSPITAL_COMMUNITY)
Admission: RE | Admit: 2015-12-18 | Discharge: 2015-12-18 | Disposition: A | Discharge status: Home / Self Care | Payer: Medicare Other | Source: Ambulatory Visit | Attending: Cardiology | Admitting: Cardiology

## 2015-12-18 DIAGNOSIS — M79604 Pain in right leg: Secondary | ICD-10-CM | POA: Diagnosis present

## 2015-12-18 DIAGNOSIS — R0989 Other specified symptoms and signs involving the circulatory and respiratory systems: Secondary | ICD-10-CM | POA: Diagnosis not present

## 2015-12-18 DIAGNOSIS — M79605 Pain in left leg: Secondary | ICD-10-CM | POA: Insufficient documentation

## 2015-12-18 DIAGNOSIS — I739 Peripheral vascular disease, unspecified: Secondary | ICD-10-CM | POA: Diagnosis not present

## 2015-12-19 ENCOUNTER — Encounter: Payer: Self-pay | Admitting: Physician Assistant

## 2015-12-19 ENCOUNTER — Telehealth: Payer: Self-pay | Admitting: *Deleted

## 2015-12-19 NOTE — Telephone Encounter (Signed)
I reached pt on his cell #. Pt has been notified of [facility] results by phone with verbal understanding. I will fax a copy of results to PCP. Pt said thank you.

## 2015-12-19 NOTE — Telephone Encounter (Signed)
Lmtcb to go over [facility] ultrasound results

## 2015-12-24 ENCOUNTER — Ambulatory Visit (INDEPENDENT_AMBULATORY_CARE_PROVIDER_SITE_OTHER): Payer: Medicare Other

## 2015-12-24 DIAGNOSIS — J309 Allergic rhinitis, unspecified: Secondary | ICD-10-CM | POA: Diagnosis not present

## 2016-01-06 ENCOUNTER — Ambulatory Visit (INDEPENDENT_AMBULATORY_CARE_PROVIDER_SITE_OTHER): Payer: Medicare Other

## 2016-01-06 DIAGNOSIS — J309 Allergic rhinitis, unspecified: Secondary | ICD-10-CM | POA: Diagnosis not present

## 2016-01-22 DIAGNOSIS — J3089 Other allergic rhinitis: Secondary | ICD-10-CM | POA: Diagnosis not present

## 2016-01-23 DIAGNOSIS — J301 Allergic rhinitis due to pollen: Secondary | ICD-10-CM | POA: Diagnosis not present

## 2016-02-03 ENCOUNTER — Other Ambulatory Visit: Payer: Self-pay | Admitting: *Deleted

## 2016-02-03 MED ORDER — METOPROLOL TARTRATE 25 MG PO TABS: 12.5000 mg | ORAL_TABLET | Freq: Two times a day (BID) | ORAL | 0 refills | Status: DC

## 2016-02-24 ENCOUNTER — Ambulatory Visit (INDEPENDENT_AMBULATORY_CARE_PROVIDER_SITE_OTHER): Payer: Medicare Other

## 2016-02-24 DIAGNOSIS — J309 Allergic rhinitis, unspecified: Secondary | ICD-10-CM | POA: Diagnosis not present

## 2016-03-02 ENCOUNTER — Ambulatory Visit (INDEPENDENT_AMBULATORY_CARE_PROVIDER_SITE_OTHER): Payer: Medicare Other

## 2016-03-02 DIAGNOSIS — J309 Allergic rhinitis, unspecified: Secondary | ICD-10-CM | POA: Diagnosis not present

## 2016-03-12 NOTE — Addendum Note (Signed)
Addended by: Felipa Emory on: 03/12/2016 12:17 PM   Modules accepted: Orders

## 2016-03-18 ENCOUNTER — Other Ambulatory Visit: Payer: Medicare Other | Admitting: *Deleted

## 2016-03-18 DIAGNOSIS — E785 Hyperlipidemia, unspecified: Secondary | ICD-10-CM

## 2016-03-18 LAB — HEPATIC FUNCTION PANEL
ALT: 42 IU/L (ref 0–44)
AST: 30 IU/L (ref 0–40)
Albumin: 3.5 g/dL (ref 3.5–4.7)
Alkaline Phosphatase: 110 IU/L (ref 39–117)
Bilirubin Total: 0.5 mg/dL (ref 0.0–1.2)
Bilirubin, Direct: 0.17 mg/dL (ref 0.00–0.40)
Total Protein: 6.3 g/dL (ref 6.0–8.5)

## 2016-03-18 LAB — LIPID PANEL
Chol/HDL Ratio: 2 ratio units (ref 0.0–5.0)
Cholesterol, Total: 117 mg/dL (ref 100–199)
HDL: 58 mg/dL (ref >39)
LDL Calculated: 48 mg/dL (ref 0–99)
Triglycerides: 56 mg/dL (ref 0–149)
VLDL Cholesterol Cal: 11 mg/dL (ref 5–40)

## 2016-03-18 NOTE — Addendum Note (Signed)
Addended by: Eulis Foster on: 03/18/2016 08:52 AM   Modules accepted: Orders

## 2016-03-19 ENCOUNTER — Telehealth: Payer: Self-pay | Admitting: *Deleted

## 2016-03-19 NOTE — Telephone Encounter (Signed)
follow up   Pt verbalized the is returning call for rn

## 2016-03-19 NOTE — Telephone Encounter (Signed)
Lmtcb to go over lab results 

## 2016-03-19 NOTE — Telephone Encounter (Signed)
Pt notified of lab results by phone with verbal understanding.  

## 2016-03-22 ENCOUNTER — Ambulatory Visit (INDEPENDENT_AMBULATORY_CARE_PROVIDER_SITE_OTHER): Payer: Medicare Other

## 2016-03-22 DIAGNOSIS — J309 Allergic rhinitis, unspecified: Secondary | ICD-10-CM

## 2016-03-29 ENCOUNTER — Telehealth: Payer: Self-pay | Admitting: Physician Assistant

## 2016-03-29 NOTE — Telephone Encounter (Signed)
Clarified address with wife.  Labs sent.

## 2016-03-29 NOTE — Telephone Encounter (Signed)
Follow Up:  Pt would like a copy of his lab results from last week please.Marland Kitchen

## 2016-04-15 ENCOUNTER — Other Ambulatory Visit: Payer: Self-pay | Admitting: Orthopedic Surgery

## 2016-04-15 DIAGNOSIS — M545 Low back pain: Secondary | ICD-10-CM

## 2016-04-26 ENCOUNTER — Inpatient Hospital Stay
Admission: RE | Admit: 2016-04-26 | Discharge: 2016-04-26 | Disposition: A | Discharge status: Home / Self Care | Payer: Medicare Other | Source: Ambulatory Visit | Attending: Orthopedic Surgery | Admitting: Orthopedic Surgery

## 2016-04-26 ENCOUNTER — Ambulatory Visit
Admission: RE | Admit: 2016-04-26 | Discharge: 2016-04-26 | Disposition: A | Discharge status: Home / Self Care | Payer: Medicare Other | Source: Ambulatory Visit | Attending: Orthopedic Surgery | Admitting: Orthopedic Surgery

## 2016-04-26 DIAGNOSIS — M545 Low back pain: Secondary | ICD-10-CM

## 2016-04-26 MED ORDER — GADOBENATE DIMEGLUMINE 529 MG/ML IV SOLN
20.0000 mL | Freq: Once | INTRAVENOUS | Status: AC | PRN
  Administered 2016-04-26: 20 mL via INTRAVENOUS

## 2016-04-27 ENCOUNTER — Other Ambulatory Visit: Payer: Self-pay | Admitting: Internal Medicine

## 2016-05-26 ENCOUNTER — Encounter: Payer: Self-pay | Admitting: Internal Medicine

## 2016-06-13 NOTE — Progress Notes (Signed)
Cardiology Office Note   Date:  06/14/2016   ID:  Larry Church, DOB [birth year], MRN [id number]  PCP:  Lilian Coma., MD  Cardiologist:   Dorris Carnes, MD    F/U of CAD     History of Present Illness: Larry Church is a 83 y.o. male with a history of   CAD with cardiac catheterization 11/2013 revealing diffuse mild to moderate disease with ectatic circumflex and RCA. Most notable lesions would be tandem lesions in the LAD but there was no culprit lesion. EF 50%. 2-D echo EF 45-50%. Patient also had bigeminy and Beta blocker was recommended. Pt also has a history of SAH and HL    I saw the pt in April 2017  He was seen by [name] in November  Lipitor backed down to 40  Lipids in April LDL was 67  Pt denies CP  No PND  No palpitations  Breathing is stable  Some edema in leg  Outpatient Medications Prior to Visit  Medication Sig Dispense Refill  . acetaminophen (TYLENOL) 325 MG tablet Take 2 tablets (650 mg total) by mouth every 6 (six) hours as needed for mild pain.    Marland Kitchen albuterol (PROVENTIL HFA;VENTOLIN HFA) 108 (90 BASE) MCG/ACT inhaler Inhale 2 puffs into the lungs every 6 (six) hours as needed for wheezing.    Marland Kitchen aspirin EC 325 MG EC tablet Take 1 tablet (325 mg total) by mouth 2 (two) times daily after a meal. 30 tablet 0  . atorvastatin (LIPITOR) 40 MG tablet Take 1 tablet (40 mg total) by mouth daily. 90 tablet 3  . azelastine (ASTELIN) 0.1 % nasal spray ONE TO TWO SPRAYS EACH NOSTRIL ONE TO TWO TIMES A DAY IF NEEDED FOR NASAL CONGESTION OR DRAINAGE. 30 mL 5  . b complex vitamins tablet Take 1 tablet by mouth daily.    Marland Kitchen buPROPion (WELLBUTRIN SR) 150 MG 12 hr tablet Take 150 mg by mouth daily.     . calcium carbonate (TUMS - DOSED IN MG ELEMENTAL CALCIUM) 500 MG chewable tablet Chew 1 tablet by mouth as needed for indigestion or heartburn.    . Dextromethorphan-Guaifenesin (MUCINEX DM MAXIMUM STRENGTH) 60-1200 MG TB12 Take 1,200 mg by mouth daily as needed (CONGESTION).       Marland Kitchen EPINEPHrine (EPIPEN 2-PAK) 0.3 mg/0.3 mL IJ SOAJ injection Inject 0.3 mLs (0.3 mg total) into the muscle once. 2 Device 2  . esomeprazole (NEXIUM) 20 MG capsule Take 20 mg by mouth as needed (for heartburn).    . finasteride (PROSCAR) 5 MG tablet Take 5 mg by mouth daily.    Marland Kitchen guaiFENesin (MUCINEX) 600 MG 12 hr tablet Take 1,200 mg by mouth 2 (two) times daily as needed for cough or to loosen phlegm.     Marland Kitchen levocetirizine (XYZAL) 5 MG tablet Take 5 mg by mouth daily.    . Liniments (BLUE-EMU SUPER STRENGTH) CREA Apply 1 application topically daily as needed (pain).    . memantine (NAMENDA) 10 MG tablet 1/2 DAILY FOR 1 WEEK THEN 1/2 BID FOR A WEEK THEN 1/2 IN AM  AND 1 IN PM FOR A WEEK THEN 1 BID    . Menthol, Topical Analgesic, (BIOFREEZE EX) Apply 1 application topically daily as needed (pain).    . metoprolol tartrate (LOPRESSOR) 25 MG tablet TAKE 1/2 TABLET(12.5 MG) BY MOUTH TWICE DAILY 90 tablet 2  . Multiple Vitamin (MULTIVITAMIN) tablet Take 1 tablet by mouth daily.    Marland Kitchen MYRBETRIQ 25 MG  TB24 tablet Take 25 mg by mouth daily.   10  . NASONEX 50 MCG/ACT nasal spray Place 2 sprays into the nose as needed (for congestion).   4  . nitroGLYCERIN (NITROSTAT) 0.4 MG SL tablet Place 1 tablet (0.4 mg total) under the tongue every 5 (five) minutes x 3 doses as needed for chest pain. 25 tablet 12  . Olopatadine HCl 0.6 % SOLN Place 1 Bottle into both nostrils as needed. Sinus irrigation  4  . Polyethyl Glycol-Propyl Glycol (SYSTANE OP) Place 1 drop into both eyes as needed (for dry eyes).    . venlafaxine XR (EFFEXOR-XR) 150 MG 24 hr capsule Take 150 mg by mouth daily.  2  . venlafaxine XR (EFFEXOR-XR) 75 MG 24 hr capsule Take 75 mg by mouth 2 (two) times daily.     . montelukast (SINGULAIR) 10 MG tablet Take 1 tablet (10 mg total) by mouth at bedtime. (Patient not taking: Reported on 06/14/2016) 30 tablet 5  . Omega-3 1000 MG CAPS Take 554 mg by mouth daily.     Marland Kitchen oxyCODONE (ROXICODONE) 5 MG  immediate release tablet Take 0.5 tablets (2.5 mg total) by mouth every 4 (four) hours as needed for severe pain. (Patient not taking: Reported on 06/14/2016) 3 tablet 0   Facility-Administered Medications Prior to Visit  Medication Dose Route Frequency Provider Last Rate Last Dose  . predniSONE (DELTASONE) tablet 10 mg  10 mg Oral UD Adelina Mings, MD         Allergies:   Penicillins; Bee venom; Donepezil; and Methocarbamol   Past Medical History:  Diagnosis Date  . Anxiety   . Arthritis    "bad; all over" (09/24/2014)  . Asthma   . Basal cell carcinoma    "cut and burned off  top of head and face"  . Chronic bronchitis ([place])    "got it q yr til this year" (09/24/2014)  . Chronic lower back pain   . Chronic sinusitis   . Coronary artery disease    LHC 10/15: pLAD 20-30, dLAD 50, origin D1 40; oLCx 40 then irregs; oRI 20, RCA diff irregs up to 10-20  . Degenerative arthritis   . Depression   . GERD (gastroesophageal reflux disease)   . Hypercholesterolemia   . Leg pain    ABIs 11/17: normal  . LV dysfunction    Echo 10/15: Mod LVH, EF 40-50, no apical clot, aortic sclerosis, MAC, trivial MR, mild LAE  . OSA on CPAP   . PVC's (premature ventricular contractions)    "I inherited a 4th skip from my mother"  . Seasonal allergies     Past Surgical History:  Procedure Laterality Date  . ANTERIOR CERVICAL DECOMP/DISCECTOMY FUSION  1998  . ANTERIOR CERVICAL DISCECTOMY  1989  . APPENDECTOMY  1967  . BACK SURGERY    . BASAL CELL CARCINOMA EXCISION     "top of my head"  . CARPAL TUNNEL RELEASE Bilateral 1980's  . CATARACT EXTRACTION W/ INTRAOCULAR LENS  IMPLANT, BILATERAL Bilateral 2013  . CHOLECYSTECTOMY    . COLONOSCOPY    . CORONARY ANGIOPLASTY WITH STENT PLACEMENT  ~ 1992  . EYE MUSCLE SURGERY Right 2013  . JOINT REPLACEMENT    . LEFT HEART CATHETERIZATION WITH CORONARY ANGIOGRAM N/A 12/10/2013   Procedure: LEFT HEART CATHETERIZATION WITH CORONARY ANGIOGRAM;  Surgeon:  Leonie Man, MD;  Location: [facility] CATH LAB;  Service: Cardiovascular;  Laterality: N/A;  . LUMBAR LAMINECTOMY/DECOMPRESSION MICRODISCECTOMY Bilateral 12/04/2012   Procedure:  BILATERAL LUMBAR LAMINECTOMY/DECOMPRESSION MICRODISCECTOMY LUMBAR FOUR-TO FIVE SACRALONE;  Surgeon: Elaina Hoops, MD;  Location: [place] [facility];  Service: Neurosurgery;  Laterality: Bilateral;  . REVISION TOTAL HIP ARTHROPLASTY Right 2002   "changed a ball"  . SHOULDER ARTHROSCOPY Bilateral    bone spurs  . TONSILLECTOMY  ~ 1947  . TOTAL HIP ARTHROPLASTY Bilateral 1989-1995   "right-left"  . TOTAL KNEE ARTHROPLASTY Left 09/24/2014  . TOTAL KNEE ARTHROPLASTY Left 09/24/2014   Procedure: TOTAL KNEE ARTHROPLASTY;  Surgeon: Melrose Nakayama, MD;  Location: [place] [facility];  Service: Orthopedics;  Laterality: Left;     Social History:  The patient  reports that he quit smoking about 56 years ago. His smoking use included Cigarettes. He has a 4.00 pack-year smoking history. He has never used smokeless tobacco. He reports that he does not drink alcohol or use drugs.   Family History:  The patient's family history includes Cancer in his brother and mother.    ROS:  Please see the history of present illness. All other systems are reviewed and  Negative to the above problem except as noted.    PHYSICAL EXAM: VS:  BP 124/72   Pulse 93   Ht 5' 5.5" (1.664 m)   Wt 244 lb 12.8 oz (111 kg)   SpO2 94%   BMI 40.12 kg/m   Pt exmined in chair   GEN: Morbidly obese 83 yo  in no acute distress HEENT: normal Neck: no JVD, carotid bruits, or masses Cardiac: RRR; no murmurs, rubs, or gallops,1-2+ edema  Respiratory:  clear to auscultation bilaterally, normal work of breathing GI: soft, nontender, nondistended, + BS  No hepatomegaly  MS: no deformity Moving all extremities   Skin: warm and dry, no rash Neuro:  Strength and sensation are intact Psych: euthymic mood, full affect   EKG:  EKG is not  ordered today.   Lipid Panel    Component  Value Date/Time   CHOL 117 03/18/2016 0852   TRIG 56 03/18/2016 0852   HDL 58 03/18/2016 0852   CHOLHDL 2.0 03/18/2016 0852   CHOLHDL 3 02/05/2014 0843   VLDL 17.8 02/05/2014 0843   LDLCALC 48 03/18/2016 0852      Wt Readings from Last 3 Encounters:  06/14/16 244 lb 12.8 oz (111 kg)  12/17/15 240 lb 12.8 oz (109.2 kg)  11/29/15 225 lb (102.1 kg)      ASSESSMENT AND PLAN: 1  CAD  No symptoms ot angina  With edema will check BNP and BMET today    2. HL  Good control on lipitor 40  3  HTN  Good control    Discussed salt restriction  They eat out 1x per day    F/U in 6 months   Signed, Dorris Carnes, MD  06/14/2016 9:59 AM    [facility] [place], [place], [place]  [address] Phone: [phone]; Fax: [phone]

## 2016-06-14 ENCOUNTER — Encounter (INDEPENDENT_AMBULATORY_CARE_PROVIDER_SITE_OTHER): Payer: Self-pay

## 2016-06-14 ENCOUNTER — Encounter: Payer: Self-pay | Admitting: Internal Medicine

## 2016-06-14 ENCOUNTER — Ambulatory Visit (INDEPENDENT_AMBULATORY_CARE_PROVIDER_SITE_OTHER): Payer: Medicare Other | Admitting: Internal Medicine

## 2016-06-14 VITALS — BP 124/72 | HR 93 | Ht 65.5 in | Wt 244.8 lb

## 2016-06-14 DIAGNOSIS — I2510 Atherosclerotic heart disease of native coronary artery without angina pectoris: Secondary | ICD-10-CM | POA: Diagnosis not present

## 2016-06-14 NOTE — Patient Instructions (Signed)
Your physician recommends that you continue on your current medications as directed. Please refer to the Current Medication list given to you today.  Your physician recommends that you return for lab work today (BMET, BNP)  Your physician wants you to follow-up in: [place] [address] [place]. Har[place] Challenger.  You will receive a reminder letter in the mail two months in advance. If you don't receive a letter, please call our office to schedule the follow-up appointment.

## 2016-06-15 LAB — BASIC METABOLIC PANEL
BUN/Creatinine Ratio: 22 (ref 10–24)
BUN: 22 mg/dL (ref 8–27)
CO2: 21 mmol/L (ref 18–29)
Calcium: 9.2 mg/dL (ref 8.6–10.2)
Chloride: 99 mmol/L (ref 96–106)
Creatinine, Ser: 0.99 mg/dL (ref 0.76–1.27)
GFR calc Af Amer: 82 mL/min/{1.73_m2} (ref >59)
GFR calc non Af Amer: 71 mL/min/{1.73_m2} (ref >59)
Glucose: 103 mg/dL — ABNORMAL HIGH (ref 65–99)
Potassium: 4.2 mmol/L (ref 3.5–5.2)
Sodium: 134 mmol/L (ref 134–144)

## 2016-06-15 LAB — PRO B NATRIURETIC PEPTIDE: NT-Pro BNP: 247 pg/mL (ref 0–486)

## 2016-06-17 ENCOUNTER — Other Ambulatory Visit: Payer: Self-pay | Admitting: *Deleted

## 2016-06-17 ENCOUNTER — Other Ambulatory Visit: Payer: Self-pay | Admitting: Internal Medicine

## 2016-06-17 DIAGNOSIS — R609 Edema, unspecified: Secondary | ICD-10-CM

## 2016-06-17 MED ORDER — FUROSEMIDE 40 MG PO TABS: ORAL_TABLET | ORAL | 6 refills | Status: DC

## 2016-06-17 MED ORDER — POTASSIUM CHLORIDE CRYS ER 20 MEQ PO TBCR: EXTENDED_RELEASE_TABLET | ORAL | 6 refills | Status: DC

## 2016-06-18 ENCOUNTER — Encounter (HOSPITAL_BASED_OUTPATIENT_CLINIC_OR_DEPARTMENT_OTHER): Payer: Self-pay | Admitting: *Deleted

## 2016-06-18 ENCOUNTER — Emergency Department (HOSPITAL_BASED_OUTPATIENT_CLINIC_OR_DEPARTMENT_OTHER)
Admission: EM | Admit: 2016-06-18 | Discharge: 2016-06-18 | Disposition: A | Discharge status: Home / Self Care | Payer: Medicare Other | Attending: Emergency Medicine | Admitting: Emergency Medicine

## 2016-06-18 DIAGNOSIS — J45909 Unspecified asthma, uncomplicated: Secondary | ICD-10-CM | POA: Insufficient documentation

## 2016-06-18 DIAGNOSIS — Z87891 Personal history of nicotine dependence: Secondary | ICD-10-CM | POA: Insufficient documentation

## 2016-06-18 DIAGNOSIS — W228XXA Striking against or struck by other objects, initial encounter: Secondary | ICD-10-CM | POA: Insufficient documentation

## 2016-06-18 DIAGNOSIS — Y929 Unspecified place or not applicable: Secondary | ICD-10-CM | POA: Diagnosis not present

## 2016-06-18 DIAGNOSIS — I2510 Atherosclerotic heart disease of native coronary artery without angina pectoris: Secondary | ICD-10-CM | POA: Insufficient documentation

## 2016-06-18 DIAGNOSIS — Y999 Unspecified external cause status: Secondary | ICD-10-CM | POA: Insufficient documentation

## 2016-06-18 DIAGNOSIS — L02511 Cutaneous abscess of right hand: Secondary | ICD-10-CM | POA: Diagnosis not present

## 2016-06-18 DIAGNOSIS — I10 Essential (primary) hypertension: Secondary | ICD-10-CM | POA: Insufficient documentation

## 2016-06-18 DIAGNOSIS — Y939 Activity, unspecified: Secondary | ICD-10-CM | POA: Diagnosis not present

## 2016-06-18 DIAGNOSIS — S6991XA Unspecified injury of right wrist, hand and finger(s), initial encounter: Secondary | ICD-10-CM | POA: Diagnosis present

## 2016-06-18 NOTE — Discharge Instructions (Signed)
Take the doxycycline that her primary care doctor has given you. Follow-up with dermatology in 2 weeks as planned. Watch for worsening infection.

## 2016-06-18 NOTE — ED Notes (Signed)
Small abscess noted to right hand, drained by MD.  Patient injured hand on nail several months ago and it is not healing wanted 2nd opinion.  No fevers, full strength and movement in fingers, mild redness.

## 2016-06-18 NOTE — ED Triage Notes (Signed)
Hit and slid his right hand on a rough board 2 weeks ago. Skin on the back of his hand is red and painful.

## 2016-06-18 NOTE — ED Provider Notes (Signed)
[facility] DEPT [facility] Provider Note   CSN: [id number] Arrival date & time: 06/18/16  1634     History   Chief Complaint Chief Complaint  Patient presents with  . Hand Injury    HPI Larry Church is a 83 y.o. male.  HPI Patient last couple months has had infection on his right hand. Injured on a nail a couple months ago. Has had some continue draining. Occasionally has some blood come out. A small area has handed also his forearm. Saw by his primary care doctor who started him on doxycycline today. Told to follow-up with hand surgery. Came in here to get a second opinion. States because it is a Friday he didn't want to be caught up in the weekend. No fevers. States there is no real pain with it. He believes his hand freely. Past Medical History:  Diagnosis Date  . Anxiety   . Arthritis    "bad; all over" (09/24/2014)  . Asthma   . Basal cell carcinoma    "cut and burned off  top of head and face"  . Chronic bronchitis ([place])    "got it q yr til this year" (09/24/2014)  . Chronic lower back pain   . Chronic sinusitis   . Coronary artery disease    LHC 10/15: pLAD 20-30, dLAD 50, origin D1 40; oLCx 40 then irregs; oRI 20, RCA diff irregs up to 10-20  . Degenerative arthritis   . Depression   . GERD (gastroesophageal reflux disease)   . Hypercholesterolemia   . Leg pain    ABIs 11/17: normal  . LV dysfunction    Echo 10/15: Mod LVH, EF 40-50, no apical clot, aortic sclerosis, MAC, trivial MR, mild LAE  . OSA on CPAP   . PVC's (premature ventricular contractions)    "I inherited a 4th skip from my mother"  . Seasonal allergies     Patient Active Problem List   Diagnosis Date Noted  . Mild intermittent asthma 01/16/2015  . Obstructive sleep apnea treated with bilevel positive airway pressure (BiPAP) 01/16/2015  . Gastroesophageal reflux disease without esophagitis 01/16/2015  . Current use of beta blocker 01/16/2015  . Acute maxillary sinusitis 01/16/2015  . Acute  sinusitis 01/06/2015  . Coughing 01/06/2015  . Acute bronchitis 01/06/2015  . Primary osteoarthritis of left knee 09/24/2014  . Primary osteoarthritis of knee 09/24/2014  . Traumatic subarachnoid hemorrhage ([place]) 02/20/2014  . Fall 02/19/2014  . CAD (coronary artery disease) 12/25/2013  . Cardiomyopathy, ischemic 12/25/2013  . Essential hypertension 12/25/2013  . Obesity, Class III, BMI 40-49.9 (morbid obesity) ([place]) 12/25/2013  . Dyslipidemia 12/11/2013  . Unstable angina ([place]) 12/10/2013  . Fatigue 10/31/2013  . Screening for lipoid disorders 10/31/2013  . Obstructive apnea 10/31/2013  . Cervical pain 09/26/2013  . Beat, premature ventricular 09/13/2013  . Allergic rhinitis 09/10/2013  . Chronic LBP 09/10/2013  . 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 DDD (degenerative disc disease), lumbosacral 09/10/2013  . Acid reflux 09/10/2013  . History of colon polyps 09/10/2013  . Lumbar canal stenosis 09/10/2013  . Depression, major, recurrent ([place]) 09/10/2013  . Arthritis, degenerative 09/10/2013  . Atrophic kidney 09/10/2013  . Compressed spine fracture ([place]) 09/10/2013    Past Surgical History:  Procedure Laterality Date  . ANTERIOR CERVICAL DECOMP/DISCECTOMY FUSION  1998  . ANTERIOR CERVICAL DISCECTOMY  1989  . APPENDECTOMY  1967  . BACK SURGERY    . BASAL CELL CARCINOMA EXCISION     "top of my head"  .  CARPAL TUNNEL RELEASE Bilateral 1980's  . CATARACT EXTRACTION W/ INTRAOCULAR LENS  IMPLANT, BILATERAL Bilateral 2013  . CHOLECYSTECTOMY    . COLONOSCOPY    . CORONARY ANGIOPLASTY WITH STENT PLACEMENT  ~ 1992  . EYE MUSCLE SURGERY Right 2013  . JOINT REPLACEMENT    . LEFT HEART CATHETERIZATION WITH CORONARY ANGIOGRAM N/A 12/10/2013   Procedure: LEFT HEART CATHETERIZATION WITH CORONARY ANGIOGRAM;  Surgeon: Leonie Man, MD;  Location: [facility] CATH LAB;  Service: Cardiovascular;  Laterality: N/A;  . LUMBAR LAMINECTOMY/DECOMPRESSION MICRODISCECTOMY Bilateral 12/04/2012   Procedure:  BILATERAL LUMBAR LAMINECTOMY/DECOMPRESSION MICRODISCECTOMY LUMBAR FOUR-TO FIVE SACRALONE;  Surgeon: Elaina Hoops, MD;  Location: [facility]URO ORS;  Service: Neurosurgery;  Laterality: Bilateral;  . REVISION TOTAL HIP ARTHROPLASTY Right 2002   "changed a ball"  . SHOULDER ARTHROSCOPY Bilateral    bone spurs  . TONSILLECTOMY  ~ 1947  . TOTAL HIP ARTHROPLASTY Bilateral 1989-1995   "right-left"  . TOTAL KNEE ARTHROPLASTY Left 09/24/2014  . TOTAL KNEE ARTHROPLASTY Left 09/24/2014   Procedure: TOTAL KNEE ARTHROPLASTY;  Surgeon: Melrose Nakayama, MD;  Location: [facility];  Service: Orthopedics;  Laterality: Left;       Home Medications    Prior to Admission medications   Medication Sig Start Date End Date Taking? Authorizing Provider  acetaminophen (TYLENOL) 325 MG tablet Take 2 tablets (650 mg total) by mouth every 6 (six) hours as needed for mild pain. 02/21/14   Delfina Redwood, MD  albuterol (PROVENTIL HFA;VENTOLIN HFA) 108 (90 BASE) MCG/ACT inhaler Inhale 2 puffs into the lungs every 6 (six) hours as needed for wheezing.    Historical Provider, MD  aspirin EC 325 MG EC tablet Take 1 tablet (325 mg total) by mouth 2 (two) times daily after a meal. 09/27/14   Loni Dolly, PA-C  atorvastatin (LIPITOR) 40 MG tablet Take 1 tablet (40 mg total) by mouth daily. 12/17/15 03/16/16  Liliane Shi, PA-C  azelastine (ASTELIN) 0.1 % nasal spray ONE TO TWO SPRAYS EACH NOSTRIL ONE TO TWO TIMES A DAY IF NEEDED FOR NASAL CONGESTION OR DRAINAGE. 01/06/15   Adelina Mings, MD  b complex vitamins tablet Take 1 tablet by mouth daily.    Historical Provider, MD  buPROPion (WELLBUTRIN SR) 150 MG 12 hr tablet Take 150 mg by mouth daily.  12/12/14 12/12/15  Historical Provider, MD  calcium carbonate (TUMS - DOSED IN MG ELEMENTAL CALCIUM) 500 MG chewable tablet Chew 1 tablet by mouth as needed for indigestion or heartburn.    Historical Provider, MD  Dextromethorphan-Guaifenesin (MUCINEX DM MAXIMUM STRENGTH) 60-1200 MG TB12  Take 1,200 mg by mouth daily as needed (CONGESTION).     Historical Provider, MD  EPINEPHrine (EPIPEN 2-PAK) 0.3 mg/0.3 mL IJ SOAJ injection Inject 0.3 mLs (0.3 mg total) into the muscle once. 11/25/14   Charlies Silvers, MD  esomeprazole (NEXIUM) 20 MG capsule Take 20 mg by mouth as needed (for heartburn).    Historical Provider, MD  finasteride (PROSCAR) 5 MG tablet Take 5 mg by mouth daily.    Historical Provider, MD  furosemide (LASIX) 40 MG tablet TAKE 1 TABLET BY MOUTH EVERY TUESDAY AND FRIDAY PER WEEK AS DIRECTED 06/18/16   Fay Records, MD  guaiFENesin (MUCINEX) 600 MG 12 hr tablet Take 1,200 mg by mouth 2 (two) times daily as needed for cough or to loosen phlegm.     Historical Provider, MD  levocetirizine (XYZAL) 5 MG tablet Take 5 mg by mouth daily.  Historical Provider, MD  Liniments (BLUE-EMU SUPER STRENGTH) CREA Apply 1 application topically daily as needed (pain).    Historical Provider, MD  memantine (NAMENDA) 10 MG tablet 1/2 DAILY FOR 1 WEEK THEN 1/2 BID FOR A WEEK THEN 1/2 IN AM  AND 1 IN PM FOR A WEEK THEN 1 BID 12/11/14   Historical Provider, MD  Menthol, Topical Analgesic, (BIOFREEZE EX) Apply 1 application topically daily as needed (pain).    Historical Provider, MD  metoprolol tartrate (LOPRESSOR) 25 MG tablet TAKE 1/2 TABLET(12.5 MG) BY MOUTH TWICE DAILY 04/28/16   Fay Records, MD  Multiple Vitamin (MULTIVITAMIN) tablet Take 1 tablet by mouth daily.    Historical Provider, MD  MYRBETRIQ 25 MG TB24 tablet Take 25 mg by mouth daily.  12/27/14   Historical Provider, MD  NASONEX 50 MCG/ACT nasal spray Place 2 sprays into the nose as needed (for congestion).  01/25/14   Historical Provider, MD  nitroGLYCERIN (NITROSTAT) 0.4 MG SL tablet Place 1 tablet (0.4 mg total) under the tongue every 5 (five) minutes x 3 doses as needed for chest pain. 12/11/13   Brett Canales, PA-C  Olopatadine HCl 0.6 % SOLN Place 1 Bottle into both nostrils as needed. Sinus irrigation 01/25/14   Historical  Provider, MD  Polyethyl Glycol-Propyl Glycol (SYSTANE OP) Place 1 drop into both eyes as needed (for dry eyes).    Historical Provider, MD  potassium chloride SA (K-DUR,KLOR-CON) 20 MEQ tablet TAKE 1 TABLET BY MOUTH 2 TIMES A WEEK WITH FUROSEMIDE AS DIRECTED 06/18/16   Fay Records, MD  venlafaxine XR (EFFEXOR-XR) 150 MG 24 hr capsule Take 150 mg by mouth daily. 02/27/14   Historical Provider, MD  venlafaxine XR (EFFEXOR-XR) 75 MG 24 hr capsule Take 75 mg by mouth 2 (two) times daily.  10/27/15   Historical Provider, MD    Family History Family History  Problem Relation Age of Onset  . Cancer Mother   . Cancer Brother   . Heart attack Neg Hx   . Stroke Neg Hx   . Hypertension Neg Hx     Social History Social History  Substance Use Topics  . Smoking status: Former Smoker    Packs/day: 1.00    Years: 4.00    Types: Cigarettes    Quit date: 02/16/1960  . Smokeless tobacco: Never Used  . Alcohol use No     Allergies   Penicillins; Bee venom; Donepezil; and Methocarbamol   Review of Systems Review of Systems  Constitutional: Negative for chills and fever.  Musculoskeletal: Positive for back pain.  Skin: Positive for wound.  Neurological: Negative for seizures and weakness.  Hematological: Bruises/bleeds easily.     Physical Exam Updated Vital Signs BP 133/69 (BP Location: Left Arm)   Pulse 92   Temp 98.2 F (36.8 C) (Oral)   Resp 18   Ht 5' 5.5" (1.664 m)   Wt 244 lb (110.7 kg)   SpO2 97%   BMI 39.99 kg/m   Physical Exam  Constitutional: He appears well-developed.  Musculoskeletal: He exhibits no edema.  Skin: Skin is warm.  Dorsum of right hand between second and third metacarpals somewhat distally has a mildly erythematous slightly raised fluctuant area. Approximately 1 cm to centimeter and a half. There is more firm area distally more proximally there is an open area that with pressure has some mild purulent drainage. With gentle pressure this was all expressed.  There was also one around 4 mm bump medial to this  that was also fluctuant. 18-gauge needle was placed into it and only mild blood came out. No purulence. Full painless motion of flexion extension of all the fingers. There is also healing skin tears on the dorsum of the forearm.     ED Treatments / Results  Labs (all labs ordered are listed, but only abnormal results are displayed) Labs Reviewed - No data to display  EKG  EKG Interpretation None       Radiology No results found.  Procedures Procedures (including critical care time)  Medications Ordered in ED Medications - No data to display   Initial Impression / Assessment and Plan / ED Course  I have reviewed the triage vital signs and the nursing notes.  Pertinent labs & imaging results that were available during my care of the patient were reviewed by me and considered in my medical decision making (see chart for details).     Patient has small abscess of hand. Rather minor. Purulence expressed. Given prescription for doxycycline from PCP. Patient has follow-up with dermatology in 2 weeks. Patient will be discharged home.  Final Clinical Impressions(s) / ED Diagnoses   Final diagnoses:  Abscess of dorsum of right hand    New Prescriptions Discharge Medication List as of 06/18/2016  5:00 PM       Davonna Belling, MD 06/18/16 1710

## 2016-07-19 ENCOUNTER — Other Ambulatory Visit: Payer: Medicare Other | Admitting: *Deleted

## 2016-07-19 DIAGNOSIS — R609 Edema, unspecified: Secondary | ICD-10-CM

## 2016-07-19 LAB — BASIC METABOLIC PANEL
BUN/Creatinine Ratio: 22 (ref 10–24)
BUN: 20 mg/dL (ref 8–27)
CO2: 24 mmol/L (ref 18–29)
Calcium: 9.2 mg/dL (ref 8.6–10.2)
Chloride: 100 mmol/L (ref 96–106)
Creatinine, Ser: 0.91 mg/dL (ref 0.76–1.27)
GFR calc Af Amer: 91 mL/min/{1.73_m2} (ref >59)
GFR calc non Af Amer: 79 mL/min/{1.73_m2} (ref >59)
Glucose: 72 mg/dL (ref 65–99)
Potassium: 4.2 mmol/L (ref 3.5–5.2)
Sodium: 138 mmol/L (ref 134–144)

## 2016-09-30 IMAGING — CR DG CHEST 1V PORT
1 series · 1 of 1 positions shown · non-contrast
Comparison: 12/06/2012

CLINICAL DATA: One-day history of chest pain and shortness of
breath.

EXAM:
PORTABLE CHEST - 1 VIEW

[view not recorded]
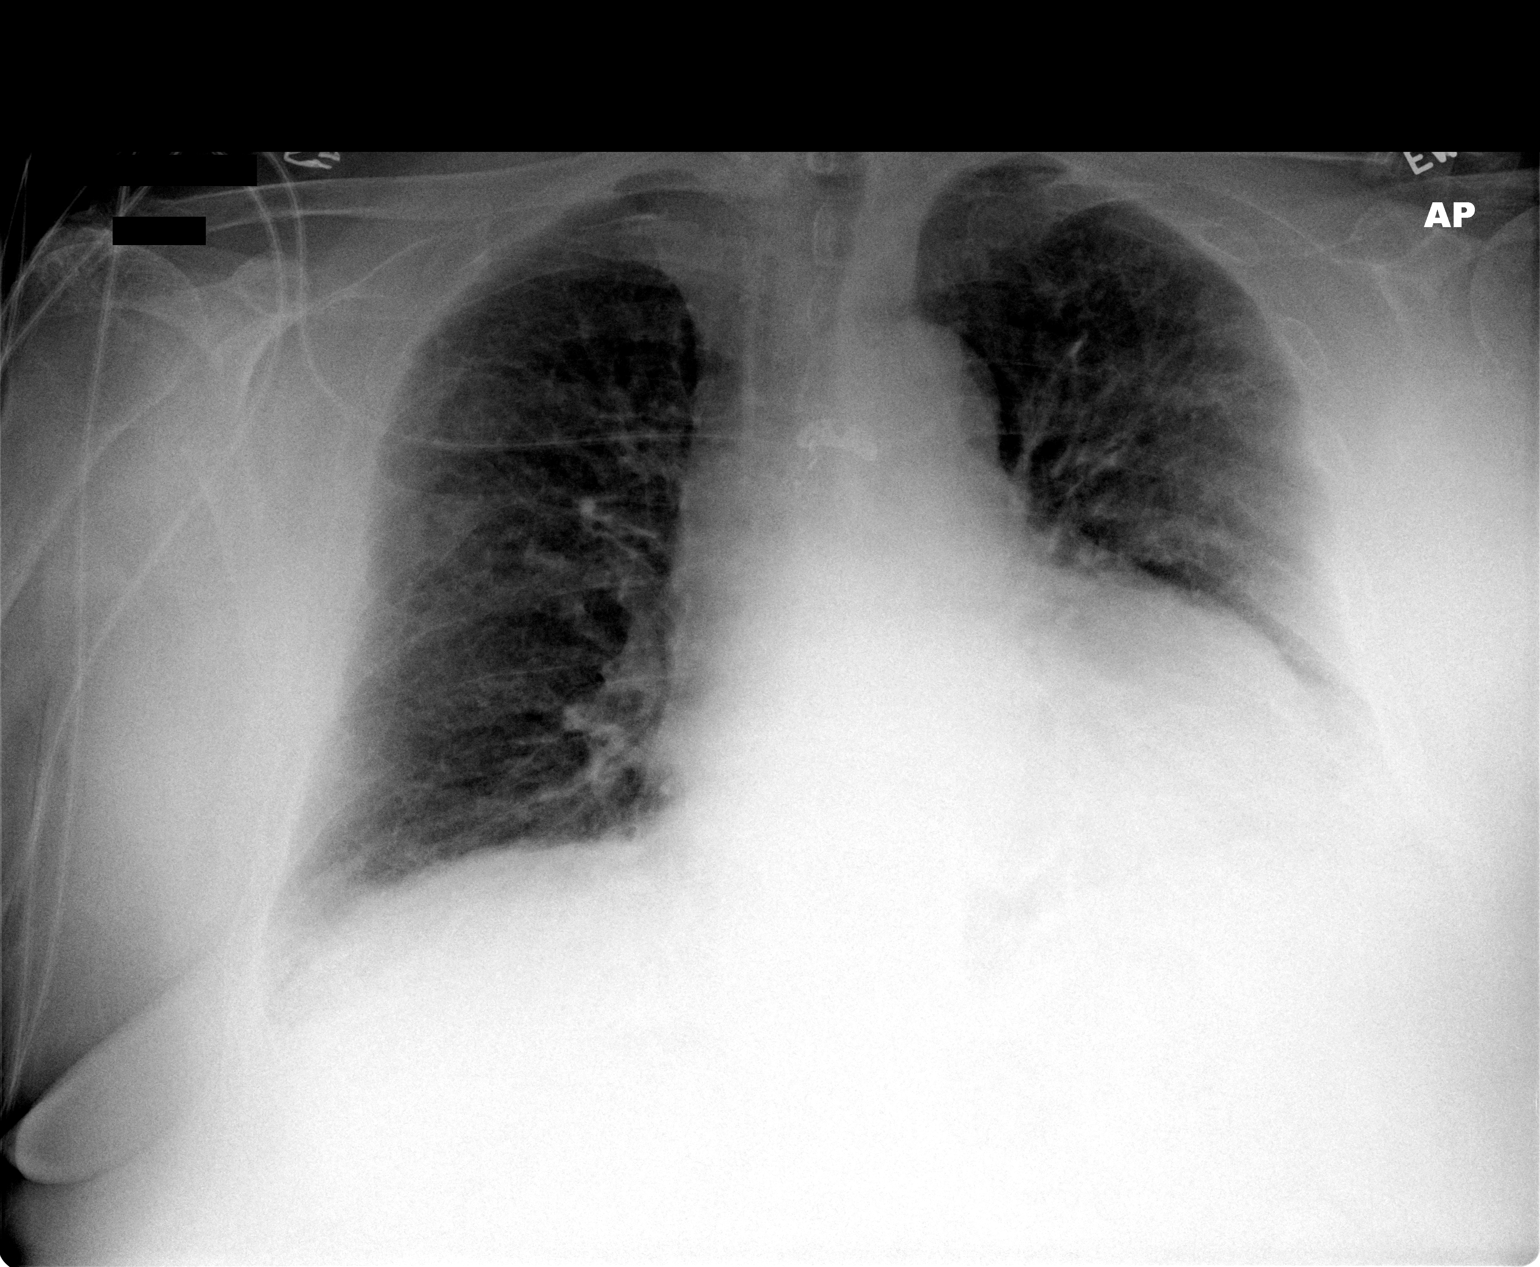

[1 of 1 positions shown; findings below may reference images not displayed]

FINDINGS: The heart is enlarged but stable. There is mild tortuosity and
ectasia of the thoracic aorta. There are chronic bronchitic type
interstitial lung changes and a left lower lobe process which is
likely atelectasis. No large pleural effusion or pneumothorax. The
bony structures are grossly intact.
IMPRESSION: Stable cardiac enlargement and tortuous thoracic aorta.

Chronic bronchitic lung changes and probable left lower lobe
atelectasis.

## 2016-12-10 IMAGING — CR DG HAND COMPLETE 3+V*L*
3 series · 3 of 3 positions shown · non-contrast
Comparison: None.

CLINICAL DATA: Fall this evening with hand pain, initial encounter

EXAM:
LEFT HAND - COMPLETE 3+ VIEW

[x hand pa left]
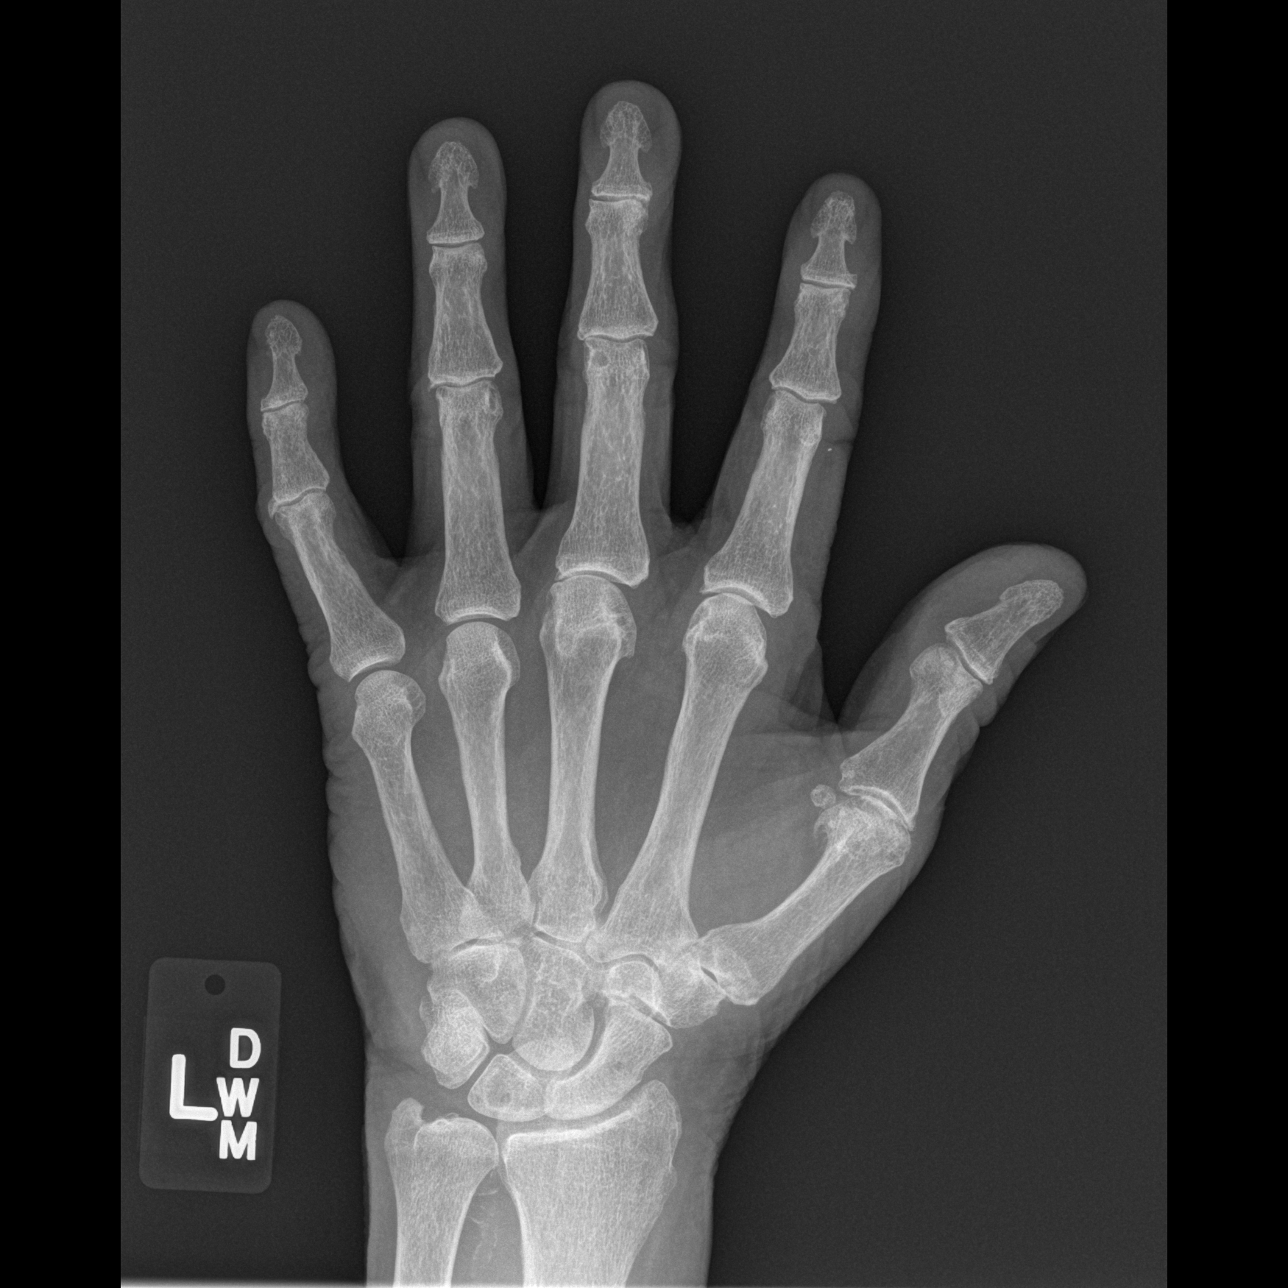

[x hand oblique left]
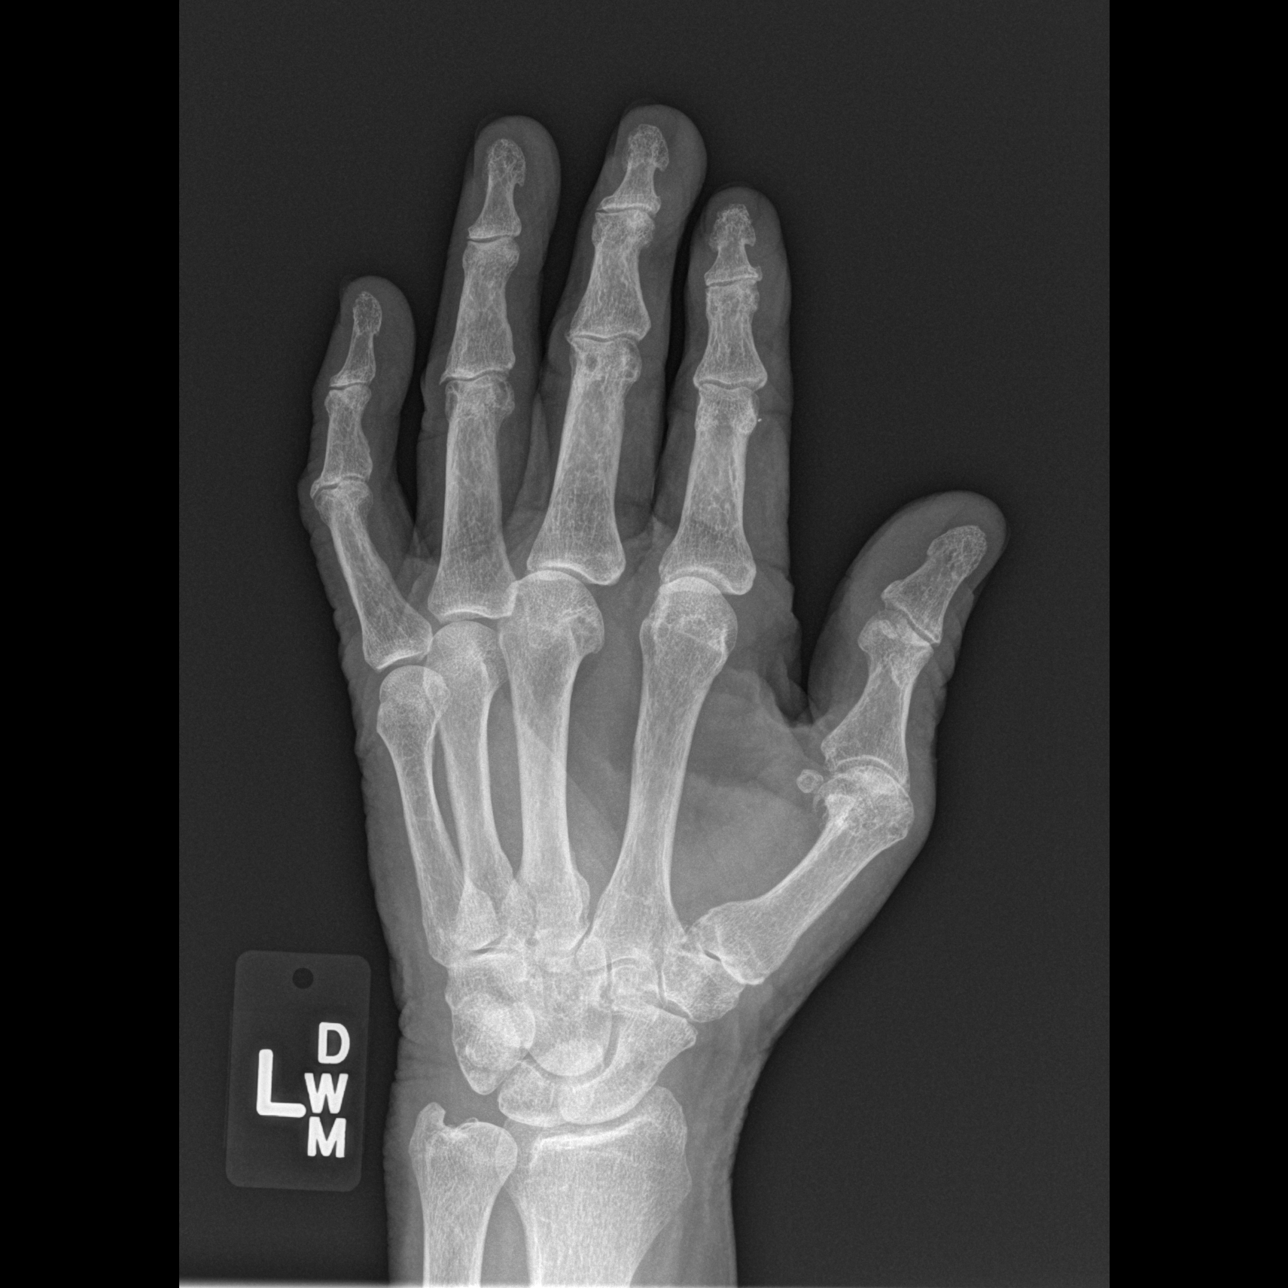

[x hand lat left]
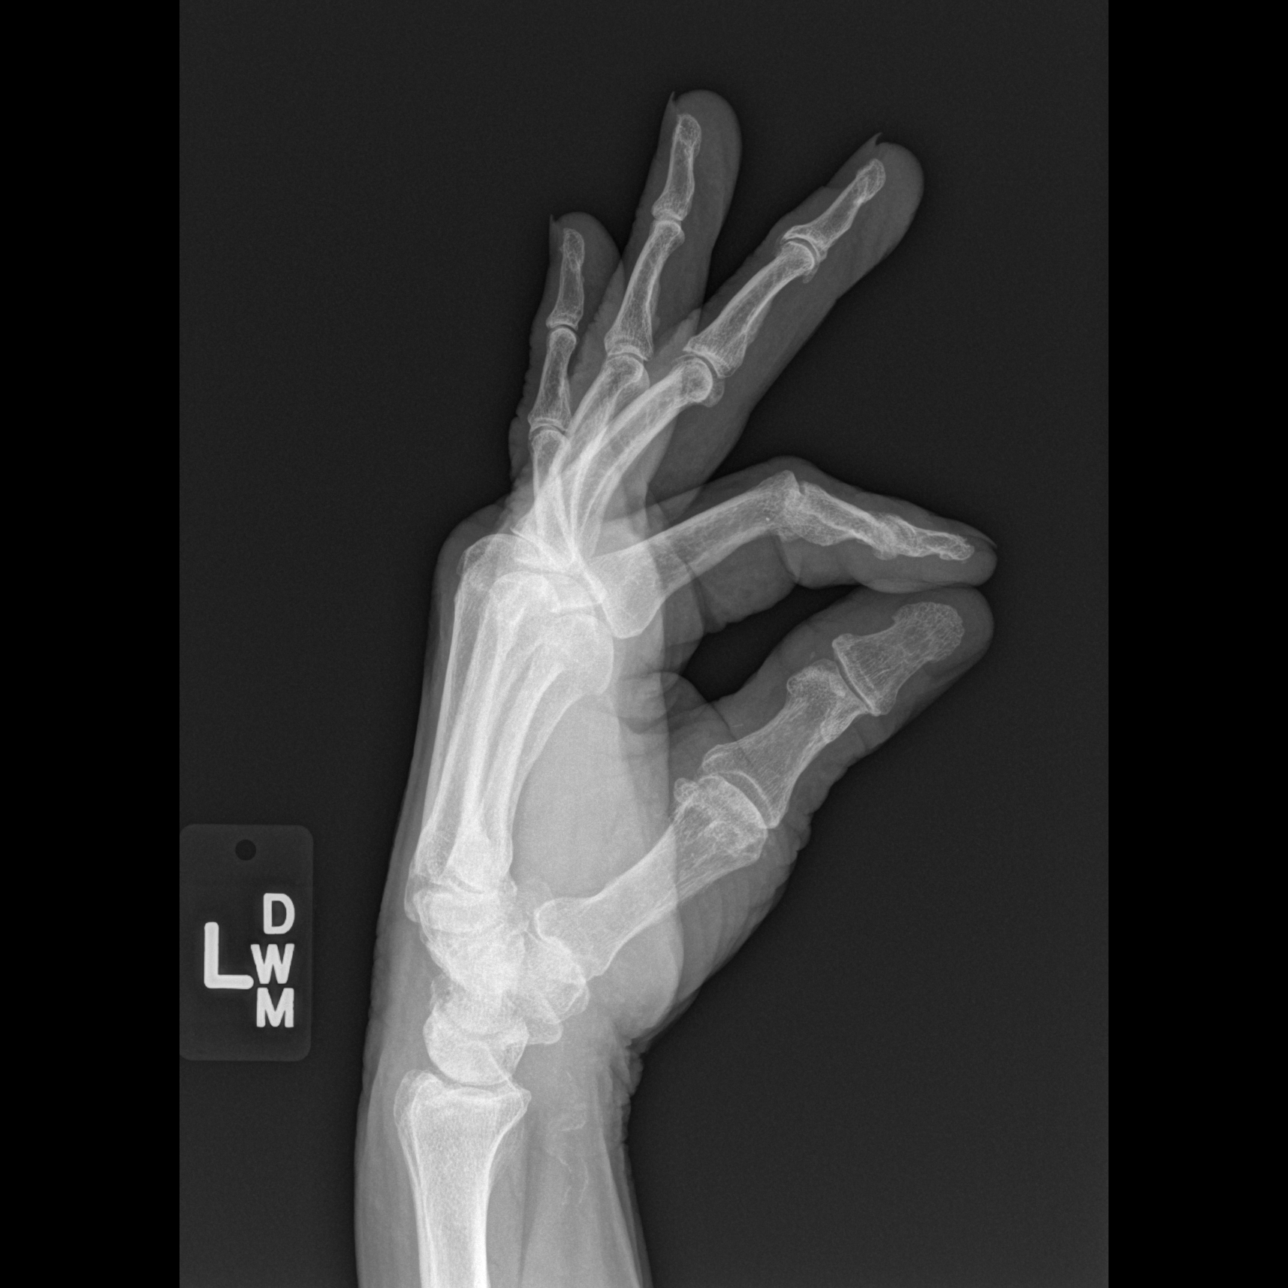

[3 of 3 positions shown; findings below may reference images not displayed]

FINDINGS: No acute fracture or dislocation is noted. Mild degenerative changes
in the interphalangeal joints are seen. No gross soft tissue
abnormality is noted.
IMPRESSION: Degenerative change without acute abnormality.

## 2016-12-10 IMAGING — CT CT HEAD W/O CM
1 series · 15 of 30 positions shown, 19 images · non-contrast
Comparison: 09/06/2009

CLINICAL DATA: Fall injury, pt states that he was walking down
stairs and fell, pt denies loc, pt with swelling above right eye

EXAM:
CT HEAD WITHOUT CONTRAST
TECHNIQUE: Contiguous axial images were obtained from the base of the skull
through the vertex without intravenous contrast.

[Series 2: head 4.8 h37s · axial · 0.47mm/px · z∈[-112,+48]mm · 15 of 36 slices shown, 19 images]
[im 2/36  brain]
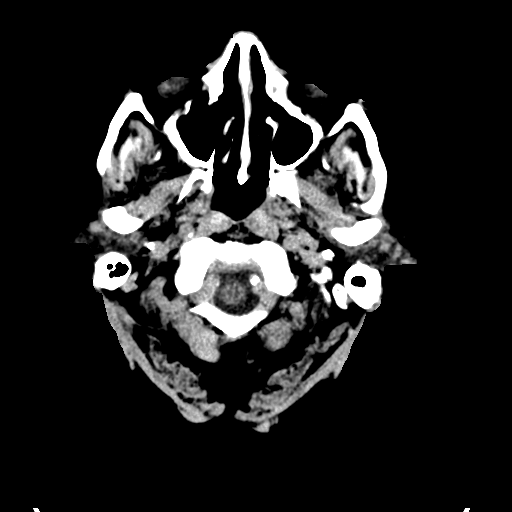
[im 2/36  bone]
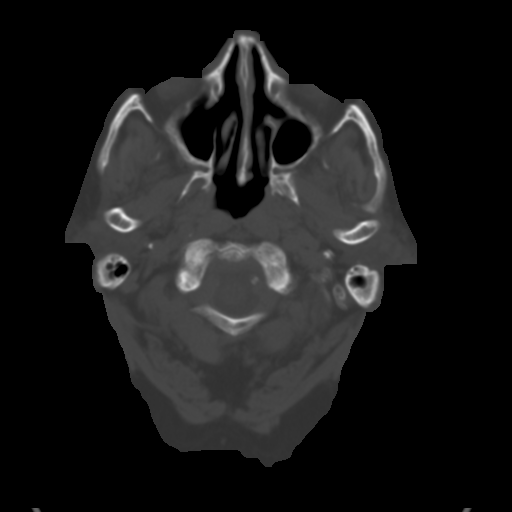
[im 4/36  brain]
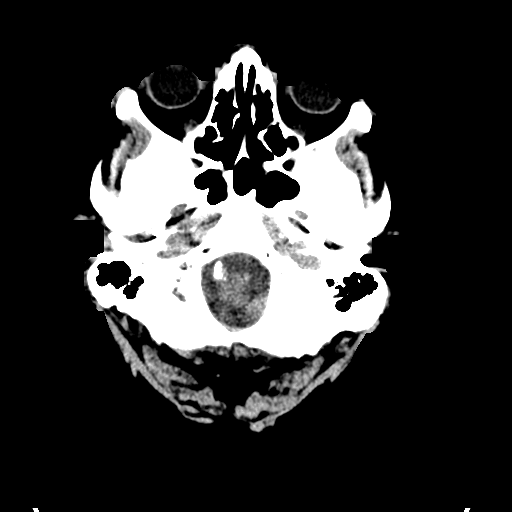
[im 7/36  brain]
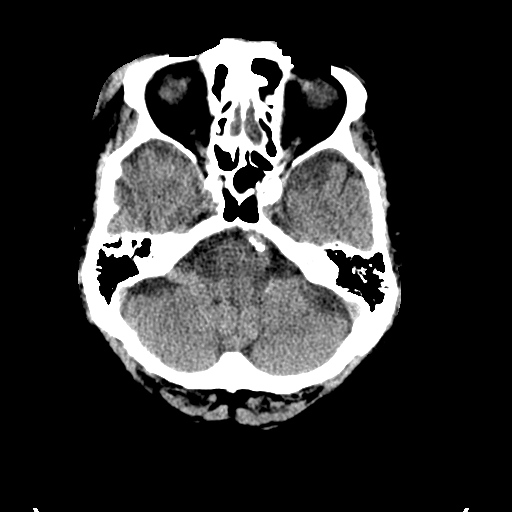
[im 9/36  brain]
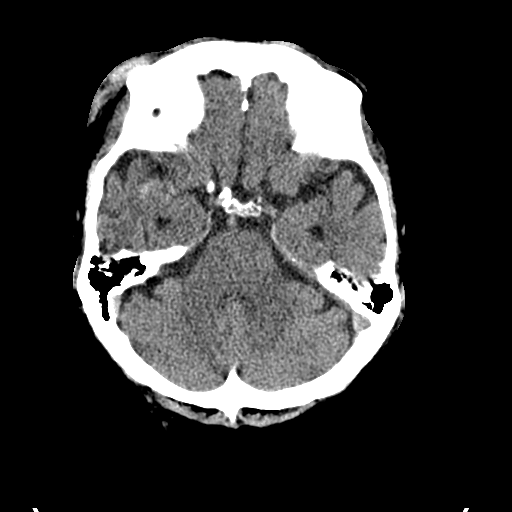
[im 11/36  brain]
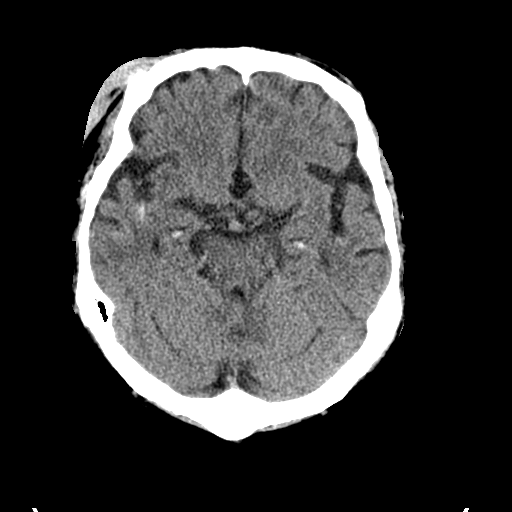
[im 11/36  bone]
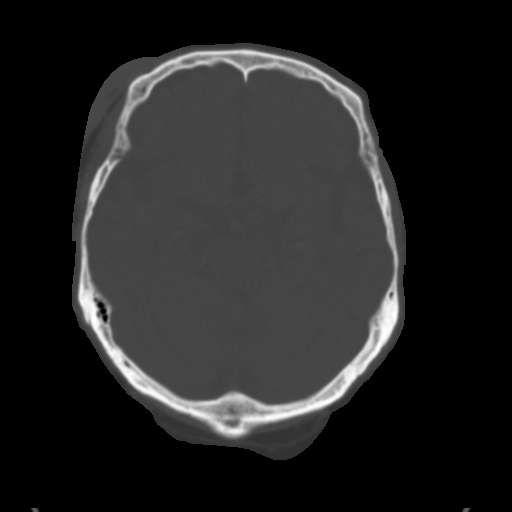
[im 14/36  brain]
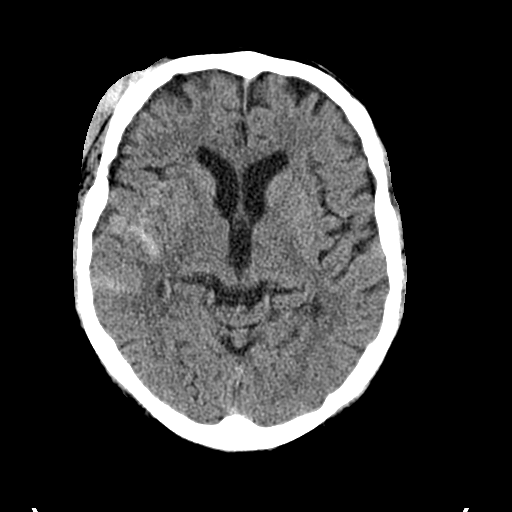
[im 16/36  brain]
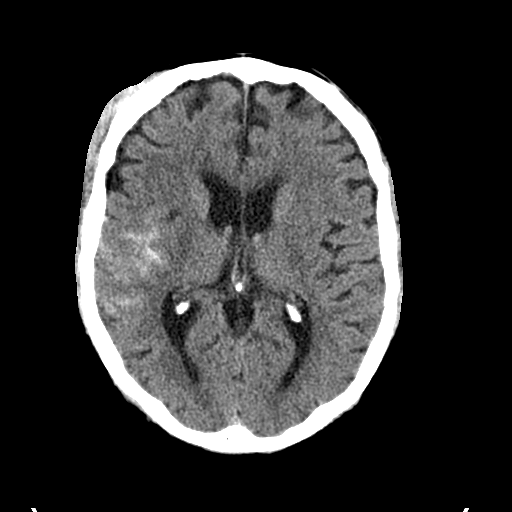
[im 19/36  brain]
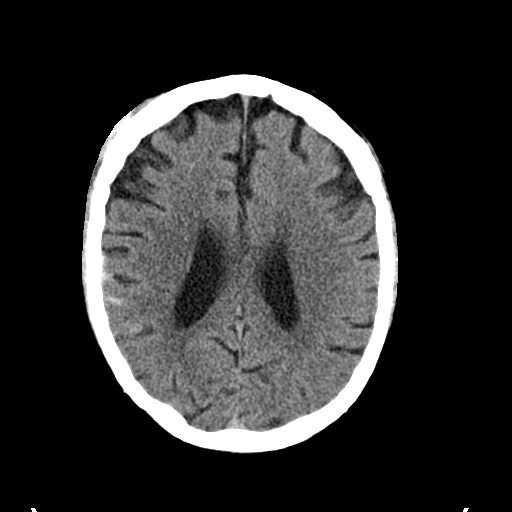
[im 20/36  brain]
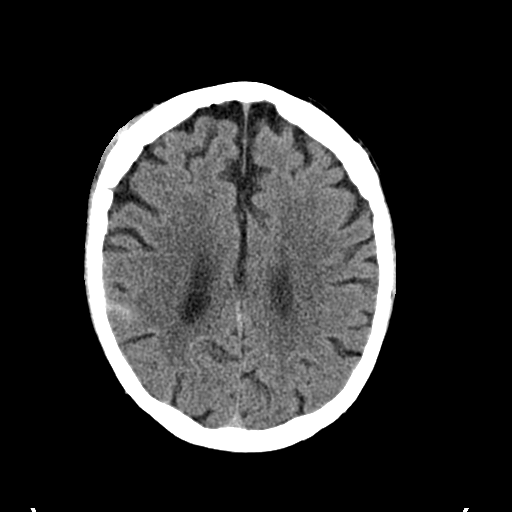
[im 20/36  bone]
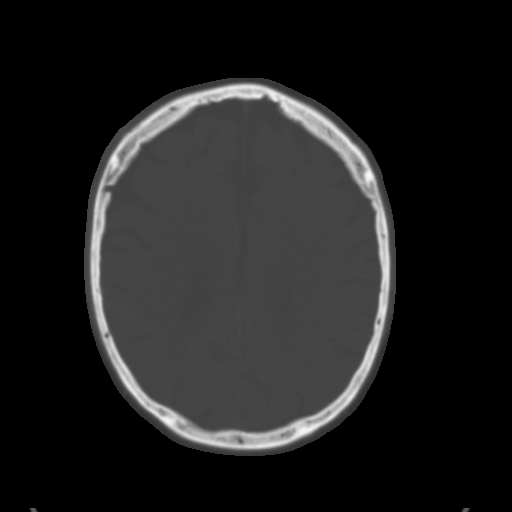
[im 22/36  brain]
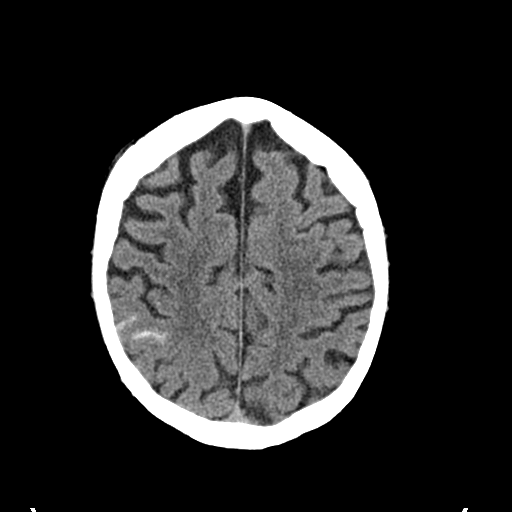
[im 25/36  brain]
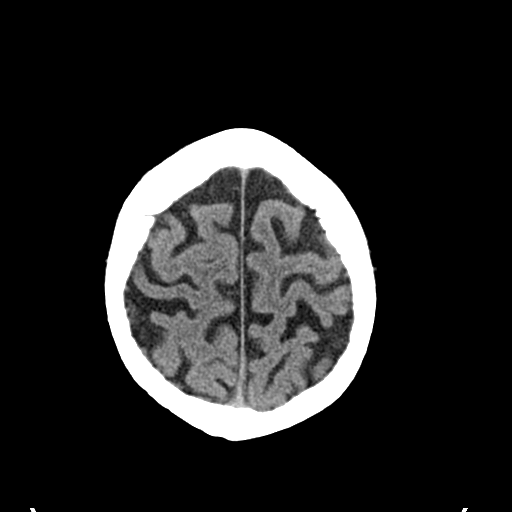
[im 27/36  brain]
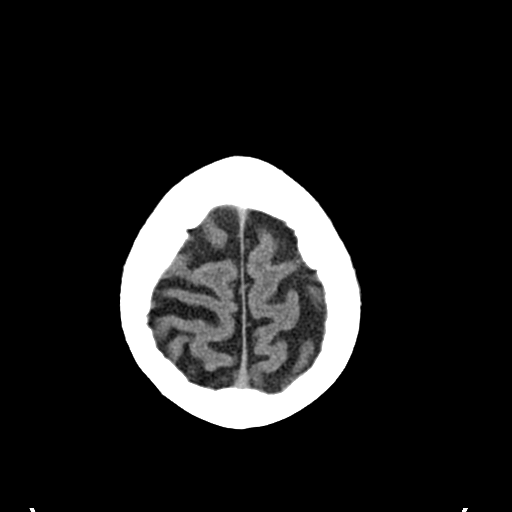
[im 29/36  brain]
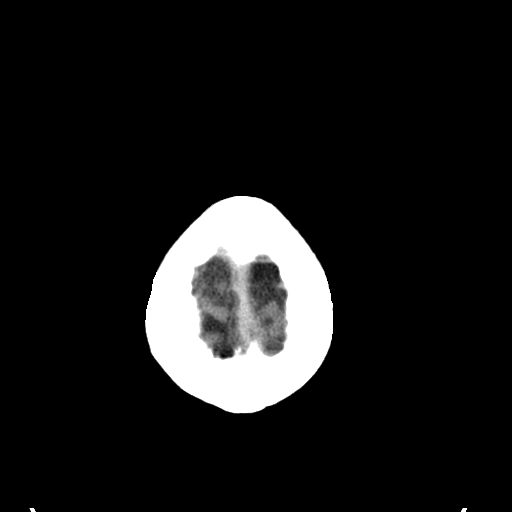
[im 29/36  bone]
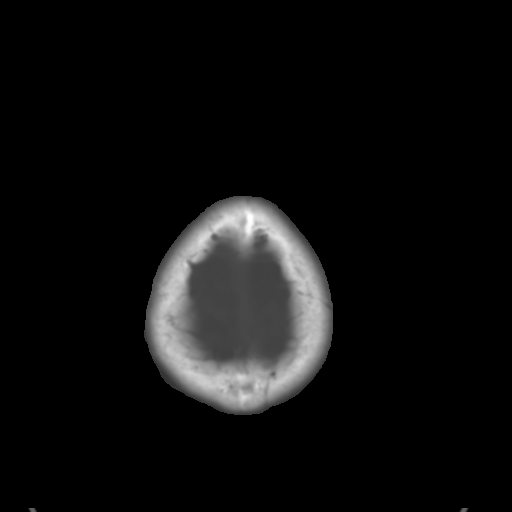
[im 32/36  brain]
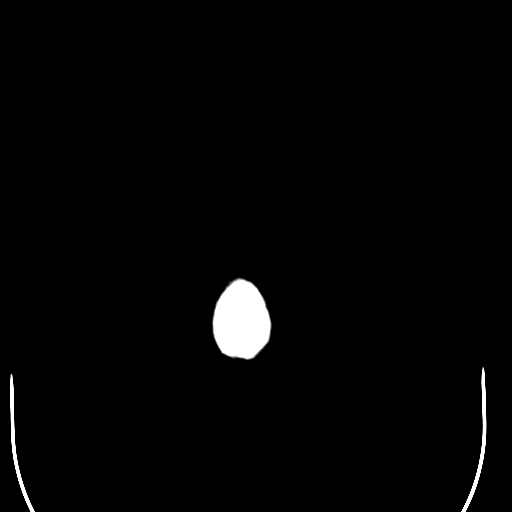
[im 34/36  brain]
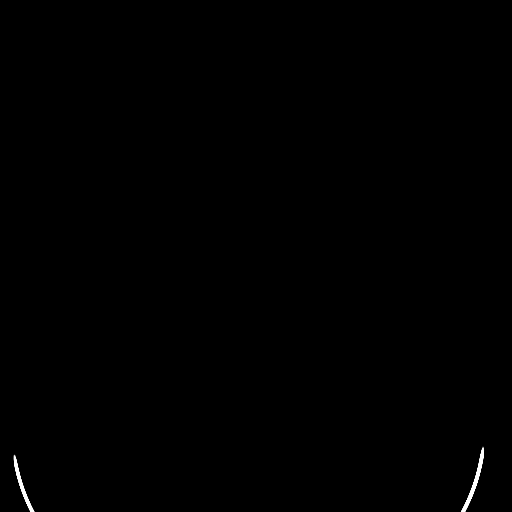

[15 of 30 positions shown; findings below may reference images not displayed]

FINDINGS: There is a large right supraorbital and frontal scalp hematoma.
There subarachnoid blood predominantly in the sylvian fissure and
extending adjacent to the posterior temporal lobe. No
intraventricular hemorrhage. No midline shift. No herniation. No
focal parenchymal edema, mass, or mass effect. Acute infarct may be
inapparent on non contrast CT. Bone windows reveal no calvarial
lesion.
IMPRESSION: 1. Subarachnoid hemorrhage centered in the right sylvian fissure,
without complicating features. While this may be posttraumatic,
ruptured intracranial aneurysm could have a similar appearance.
Critical Value/emergent results were called by telephone at the time
of interpretation on 02/19/2014 at [DATE] to Dr. TAMEKIA SOZA , who
verbally acknowledged these results.
If clinical concern persists, MRA or CTA head may be useful for
further assessment.
2. Right frontal scalp hematoma.

## 2017-01-19 ENCOUNTER — Other Ambulatory Visit: Payer: Self-pay | Admitting: Physician Assistant

## 2017-01-19 DIAGNOSIS — E7800 Pure hypercholesterolemia, unspecified: Secondary | ICD-10-CM

## 2017-01-19 DIAGNOSIS — I2510 Atherosclerotic heart disease of native coronary artery without angina pectoris: Secondary | ICD-10-CM

## 2017-02-04 ENCOUNTER — Other Ambulatory Visit: Payer: Self-pay | Admitting: Internal Medicine

## 2017-03-17 ENCOUNTER — Encounter (INDEPENDENT_AMBULATORY_CARE_PROVIDER_SITE_OTHER): Payer: Self-pay

## 2017-03-17 ENCOUNTER — Ambulatory Visit: Payer: Medicare Other | Admitting: Internal Medicine

## 2017-03-17 ENCOUNTER — Encounter: Payer: Self-pay | Admitting: Internal Medicine

## 2017-03-17 VITALS — BP 134/84 | Ht 65.5 in | Wt 247.8 lb

## 2017-03-17 DIAGNOSIS — E782 Mixed hyperlipidemia: Secondary | ICD-10-CM | POA: Diagnosis not present

## 2017-03-17 DIAGNOSIS — I2510 Atherosclerotic heart disease of native coronary artery without angina pectoris: Secondary | ICD-10-CM | POA: Diagnosis not present

## 2017-03-17 DIAGNOSIS — I10 Essential (primary) hypertension: Secondary | ICD-10-CM

## 2017-03-17 MED ORDER — ASPIRIN EC 81 MG PO TBEC: 81.0000 mg | DELAYED_RELEASE_TABLET | Freq: Every day | ORAL | 3 refills | Status: DC

## 2017-03-17 NOTE — Patient Instructions (Signed)
Your physician recommends that you continue on your current medications as directed. Please refer to the Current Medication list given to you today.  Your physician recommends that you return for lab work today (CBC).  Your physician wants you to follow-up in: December, 2019 with Dr. Harrington Challenger.  You will receive a reminder letter in the mail two months in advance. If you don't receive a letter, please call our office to schedule the follow-up appointment.

## 2017-03-17 NOTE — Progress Notes (Signed)
Cardiology Office Note   Date:  03/17/2017   ID:  Larry Church, DOB [birth year], MRN [id number]  PCP:  Lilian Coma., MD  Cardiologist:   Dorris Carnes, MD    F/U of CAD     History of Present Illness: Larry Church is a 84 y.o. male with a history of   CAD with cardiac catheterization 11/2013 revealing diffuse mild to moderate disease with ectatic circumflex and RCA. Most notable lesions would be tandem lesions in the LAD but there was no culprit lesion. EF 50%. 2-D echo EF 45-50%. Patient also had bigeminy and Beta blocker was recommended. Pt also has a history of SAH and HL    I saw th pt in Spring 2018   He has a lot of back problems, joint problems  Denies CP  He does get short of breath when he does things  Unchanged   Outpatient Medications Prior to Visit  Medication Sig Dispense Refill  . acetaminophen (TYLENOL) 325 MG tablet Take 2 tablets (650 mg total) by mouth every 6 (six) hours as needed for mild pain.    [place] Kitchen albuterol (PROVENTIL HFA;VENTOLIN HFA) 108 (90 BASE) MCG/ACT inhaler Inhale 2 puffs into the lungs every 6 (six) hours as needed for wheezing.    [place] Kitchen aspirin EC 325 MG EC tablet Take 1 tablet (325 mg total) by mouth 2 (two) times daily after a meal. 30 tablet 0  . atorvastatin (LIPITOR) 40 MG tablet TAKE 1 TABLET(40 MG) BY MOUTH DAILY 90 tablet 0  . azelastine (ASTELIN) 0.1 % nasal spray ONE TO TWO SPRAYS EACH NOSTRIL ONE TO TWO TIMES A DAY IF NEEDED FOR NASAL CONGESTION OR DRAINAGE. 30 mL 5  . b complex vitamins tablet Take 1 tablet by mouth daily.    . calcium carbonate (TUMS - DOSED IN MG ELEMENTAL CALCIUM) 500 MG chewable tablet Chew 1 tablet by mouth as needed for indigestion or heartburn.    . Dextromethorphan-Guaifenesin (MUCINEX DM MAXIMUM STRENGTH) 60-1200 MG TB12 Take 1,200 mg by mouth daily as needed (CONGESTION).     [place] Kitchen EPINEPHrine (EPIPEN 2-PAK) 0.3 mg/0.3 mL IJ SOAJ injection Inject 0.3 mLs (0.3 mg total) into the muscle once. 2 Device 2  .  esomeprazole (NEXIUM) 20 MG capsule Take 20 mg by mouth as needed (for heartburn).    . finasteride (PROSCAR) 5 MG tablet Take 5 mg by mouth daily.    . furosemide (LASIX) 40 MG tablet TAKE 1 TABLET BY MOUTH EVERY TUESDAY AND FRIDAY PER WEEK AS DIRECTED 25 tablet 3  . guaiFENesin (MUCINEX) 600 MG 12 hr tablet Take 1,200 mg by mouth 2 (two) times daily as needed for cough or to loosen phlegm.     [place] Kitchen levocetirizine (XYZAL) 5 MG tablet Take 5 mg by mouth daily.    . Liniments (BLUE-EMU SUPER STRENGTH) CREA Apply 1 application topically daily as needed (pain).    . memantine (NAMENDA) 10 MG tablet 1/2 DAILY FOR 1 WEEK THEN 1/2 BID FOR A WEEK THEN 1/2 IN AM  AND 1 IN PM FOR A WEEK THEN 1 BID    . Menthol, Topical Analgesic, (BIOFREEZE EX) Apply 1 application topically daily as needed (pain).    . metoprolol tartrate (LOPRESSOR) 25 MG tablet TAKE 1/2 TABLET(12.5 MG) BY MOUTH TWICE DAILY 90 tablet 1  . Multiple Vitamin (MULTIVITAMIN) tablet Take 1 tablet by mouth daily.    [place] Kitchen MYRBETRIQ 25 MG TB24 tablet Take 25 mg by mouth daily.   10  .  NASONEX 50 MCG/ACT nasal spray Place 2 sprays into the nose as needed (for congestion).   4  . nitroGLYCERIN (NITROSTAT) 0.4 MG SL tablet Place 1 tablet (0.4 mg total) under the tongue every 5 (five) minutes x 3 doses as needed for chest pain. 25 tablet 12  . Olopatadine HCl 0.6 % SOLN Place 1 Bottle into both nostrils as needed. Sinus irrigation  4  . Polyethyl Glycol-Propyl Glycol (SYSTANE OP) Place 1 drop into both eyes as needed (for dry eyes).    . potassium chloride SA (K-DUR,KLOR-CON) 20 MEQ tablet TAKE 1 TABLET BY MOUTH 2 TIMES A WEEK WITH FUROSEMIDE AS DIRECTED 25 tablet 3  . venlafaxine XR (EFFEXOR-XR) 150 MG 24 hr capsule Take 150 mg by mouth daily.  2  . venlafaxine XR (EFFEXOR-XR) 75 MG 24 hr capsule Take 75 mg by mouth 2 (two) times daily.     [place] Kitchen buPROPion (WELLBUTRIN SR) 150 MG 12 hr tablet Take 150 mg by mouth daily.      Facility-Administered Medications  Prior to Visit  Medication Dose Route Frequency Provider Last Rate Last Dose  . predniSONE (DELTASONE) tablet 10 mg  10 mg Oral UD Bobbitt, [place] Muta, MD         Allergies:   Penicillins; Bee venom; Donepezil; Methocarbamol; and Other   Past Medical History:  Diagnosis Date  . Anxiety   . Arthritis    "bad; all over" (09/24/2014)  . Asthma   . Basal cell carcinoma    "cut and burned off  top of head and face"  . Chronic bronchitis ([place])    "got it q yr til this year" (09/24/2014)  . Chronic lower back pain   . Chronic sinusitis   . Coronary artery disease    LHC 10/15: pLAD 20-30, dLAD 50, origin D1 40; oLCx 40 then irregs; oRI 20, RCA diff irregs up to 10-20  . Degenerative arthritis   . Depression   . GERD (gastroesophageal reflux disease)   . Hypercholesterolemia   . Leg pain    ABIs 11/17: normal  . LV dysfunction    Echo 10/15: Mod LVH, EF 40-50, no apical clot, aortic sclerosis, MAC, trivial MR, mild LAE  . OSA on CPAP   . PVC's (premature ventricular contractions)    "I inherited a 4th skip from my mother"  . Seasonal allergies     Past Surgical History:  Procedure Laterality Date  . ANTERIOR CERVICAL DECOMP/DISCECTOMY FUSION  1998  . ANTERIOR CERVICAL DISCECTOMY  1989  . APPENDECTOMY  1967  . BACK SURGERY    . BASAL CELL CARCINOMA EXCISION     "top of my head"  . CARPAL TUNNEL RELEASE Bilateral 1980's  . CATARACT EXTRACTION W/ INTRAOCULAR LENS  IMPLANT, BILATERAL Bilateral 2013  . CHOLECYSTECTOMY    . COLONOSCOPY    . CORONARY ANGIOPLASTY WITH STENT PLACEMENT  ~ 1992  . EYE MUSCLE SURGERY Right 2013  . JOINT REPLACEMENT    . LEFT HEART CATHETERIZATION WITH CORONARY ANGIOGRAM N/A 12/10/2013   Procedure: LEFT HEART CATHETERIZATION WITH CORONARY ANGIOGRAM;  Surgeon: Leonie Man, MD;  Location: [facility] CATH LAB;  Service: Cardiovascular;  Laterality: N/A;  . LUMBAR LAMINECTOMY/DECOMPRESSION MICRODISCECTOMY Bilateral 12/04/2012   Procedure: BILATERAL LUMBAR  LAMINECTOMY/DECOMPRESSION MICRODISCECTOMY LUMBAR FOUR-TO FIVE SACRALONE;  Surgeon: Elaina Hoops, MD;  Location: [facility];  Service: Neurosurgery;  Laterality: Bilateral;  . REVISION TOTAL HIP ARTHROPLASTY Right 2002   "changed a ball"  . SHOULDER ARTHROSCOPY Bilateral  bone spurs  . TONSILLECTOMY  ~ 1947  . TOTAL HIP ARTHROPLASTY Bilateral 1989-1995   "right-left"  . TOTAL KNEE ARTHROPLASTY Left 09/24/2014  . TOTAL KNEE ARTHROPLASTY Left 09/24/2014   Procedure: TOTAL KNEE ARTHROPLASTY;  Surgeon: Melrose Nakayama, MD;  Location: [facility];  Service: Orthopedics;  Laterality: Left;     Social History:  The patient  reports that he quit smoking about 57 years ago. His smoking use included cigarettes. He has a 4.00 pack-year smoking history. he has never used smokeless tobacco. He reports that he does not drink alcohol or use drugs.   Family History:  The patient's family history includes Cancer in his brother and mother.    ROS:  Please see the history of present illness. All other systems are reviewed and  Negative to the above problem except as noted.    PHYSICAL EXAM: VS:  BP 134/84   Ht 5' 5.5" (1.664 m)   Wt 247 lb 12.8 oz (112.4 kg)   BMI 40.61 kg/m   Pt exmined in chair   GEN: Morbidly obese 84 yo  in no acute distress  HEENT: normal  Neck: JVP normal  No carotid bruits, or masses Cardiac: RRR; no murmurs, rubs, or gallops,1-2+ edema  Respiratory:  clear to auscultation bilaterally, normal work of breathing GI: soft, nontender, nondistended, + BS  No hepatomegaly  MS: no deformity Moving all extremities   Skin: warm and dry, no rash Neuro:  Strength and sensation are intact Psych: euthymic mood, full affect   EKG:  EKG is ordered today.  SInus tach 107 bpm  First degree AV block  PR 242 msdec  Ventricular couplet   LVH     Lipid Panel    Component Value Date/Time   CHOL 117 03/18/2016 0852   TRIG 56 03/18/2016 0852   HDL 58 03/18/2016 0852   CHOLHDL 2.0 03/18/2016  0852   CHOLHDL 3 02/05/2014 0843   VLDL 17.8 02/05/2014 0843   LDLCALC 48 03/18/2016 0852      Wt Readings from Last 3 Encounters:  03/17/17 247 lb 12.8 oz (112.4 kg)  06/18/16 244 lb (110.7 kg)  06/14/16 244 lb 12.8 oz (111 kg)      ASSESSMENT AND PLAN: 1  CAD    I am npt convinvced that SOB is anginal equivalent.  Need to follow  He is deconditioned with back issures  I would check CBC  COntinue to follow   2. HL LDL was excellent in November (55)  Continue   3  HTN  BP is OK  Follow   F?U in December 2019    Signed, Dorris Carnes, MD  03/17/2017 10:04 AM    [facility] [place], [place], [place]  [address] Phone: [phone]; Fax: [phone]

## 2017-03-18 LAB — CBC
Hematocrit: 43.9 % (ref 37.5–51.0)
Hemoglobin: 15.1 g/dL (ref 13.0–17.7)
MCH: 33.3 pg — ABNORMAL HIGH (ref 26.6–33.0)
MCHC: 34.4 g/dL (ref 31.5–35.7)
MCV: 97 fL (ref 79–97)
Platelets: 330 10*3/uL (ref 150–379)
RBC: 4.54 x10E6/uL (ref 4.14–5.80)
RDW: 14.6 % (ref 12.3–15.4)
WBC: 15.8 10*3/uL — ABNORMAL HIGH (ref 3.4–10.8)

## 2017-04-25 ENCOUNTER — Other Ambulatory Visit: Payer: Self-pay | Admitting: Internal Medicine

## 2017-04-25 DIAGNOSIS — I2510 Atherosclerotic heart disease of native coronary artery without angina pectoris: Secondary | ICD-10-CM

## 2017-04-25 DIAGNOSIS — E7800 Pure hypercholesterolemia, unspecified: Secondary | ICD-10-CM

## 2017-08-01 ENCOUNTER — Other Ambulatory Visit: Payer: Self-pay | Admitting: Internal Medicine

## 2017-11-07 ENCOUNTER — Telehealth: Payer: Self-pay | Admitting: Internal Medicine

## 2017-11-07 NOTE — Telephone Encounter (Signed)
New message   Pt c/o of Chest Pain: STAT if CP now or developed within 24 hours  1. Are you having CP right now? No   2. Are you experiencing any other symptoms (ex. SOB, nausea, vomiting, sweating)? Sob  3. How long have you been experiencing CP?3 to 4 weeks  4. Is your CP continuous or coming and going? Coming and going   5. Have you taken Nitroglycerin? No  ?

## 2017-11-07 NOTE — Telephone Encounter (Signed)
1-2 hrs after eating feels tightness in chest -center of breastbone. Going on little while.  More frequent last few days.  Has generalized weakness (bad back and shoulders) but feels weaker with the chest tightness.  Sits in recliner and waits for it to pass. Ate Tbone steak sandwich and salad for lunch today. Breakfast couple eggs, ham and toast, coffee.   Burping some, no heartburn feeling.   Omeprazole 40 mg twice a day--started by PCP long ago.  Not taking nexium per wife (on med list) Sometimes his wife gives GasX, pt doesn't notice a difference.  Added to APP schedule for 11/09/17.

## 2017-11-09 ENCOUNTER — Encounter: Payer: Self-pay | Admitting: Physician Assistant

## 2017-11-09 ENCOUNTER — Encounter: Payer: Self-pay | Admitting: *Deleted

## 2017-11-09 ENCOUNTER — Ambulatory Visit: Payer: Medicare Other | Admitting: Physician Assistant

## 2017-11-09 VITALS — BP 106/78 | HR 73 | Ht 65.5 in | Wt 246.8 lb

## 2017-11-09 DIAGNOSIS — I10 Essential (primary) hypertension: Secondary | ICD-10-CM

## 2017-11-09 DIAGNOSIS — E782 Mixed hyperlipidemia: Secondary | ICD-10-CM | POA: Diagnosis not present

## 2017-11-09 DIAGNOSIS — I2510 Atherosclerotic heart disease of native coronary artery without angina pectoris: Secondary | ICD-10-CM

## 2017-11-09 DIAGNOSIS — R0789 Other chest pain: Secondary | ICD-10-CM

## 2017-11-09 MED ORDER — NITROGLYCERIN 0.4 MG SL SUBL: 0.4000 mg | SUBLINGUAL_TABLET | SUBLINGUAL | 3 refills | Status: DC | PRN

## 2017-11-09 NOTE — Patient Instructions (Signed)
Medication Instructions:  1. A REFILL WAS SENT IN FOR NTG   Labwork: NONE ORDERED TODAY  Testing/Procedures: Your physician has requested that you have a lexiscan myoview. For further information please visit [email or web address]. Please follow instruction sheet, as given.    Follow-Up: 6 MONTHS WITH DR. ROSS   Any Other Special Instructions Will Be Listed Below (If Applicable).     If you need a refill on your cardiac medications before your next appointment, please call your pharmacy.
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
## 2017-11-15 ENCOUNTER — Telehealth (HOSPITAL_COMMUNITY): Payer: Self-pay | Admitting: *Deleted

## 2017-11-15 NOTE — Telephone Encounter (Signed)
Attempted to call patient regarding upcoming appointment- no answer, unable to leave message.  Larry Church  

## 2017-11-18 ENCOUNTER — Ambulatory Visit (HOSPITAL_COMMUNITY): Payer: Medicare Other | Attending: Cardiology

## 2017-11-18 ENCOUNTER — Telehealth: Payer: Self-pay

## 2017-11-18 ENCOUNTER — Encounter: Payer: Self-pay | Admitting: Physician Assistant

## 2017-11-18 DIAGNOSIS — R931 Abnormal findings on diagnostic imaging of heart and coronary circulation: Secondary | ICD-10-CM

## 2017-11-18 DIAGNOSIS — R0609 Other forms of dyspnea: Secondary | ICD-10-CM | POA: Diagnosis not present

## 2017-11-18 DIAGNOSIS — I119 Hypertensive heart disease without heart failure: Secondary | ICD-10-CM | POA: Diagnosis not present

## 2017-11-18 DIAGNOSIS — I2510 Atherosclerotic heart disease of native coronary artery without angina pectoris: Secondary | ICD-10-CM | POA: Insufficient documentation

## 2017-11-18 DIAGNOSIS — R0789 Other chest pain: Secondary | ICD-10-CM | POA: Diagnosis not present

## 2017-11-18 LAB — MYOCARDIAL PERFUSION IMAGING
LV dias vol: 138 mL (ref 62–150)
LV sys vol: 74 mL
Peak HR: 91 {beats}/min
RATE: 0.51
Rest HR: 76 {beats}/min
SDS: 2
SRS: 4
SSS: 6
TID: 0.82

## 2017-11-18 MED ORDER — TECHNETIUM TC 99M TETROFOSMIN IV KIT
32.8000 | PACK | Freq: Once | INTRAVENOUS | Status: AC | PRN
  Administered 2017-11-18: 32.8 via INTRAVENOUS
  Filled 2017-11-18: qty 33

## 2017-11-18 MED ORDER — REGADENOSON 0.4 MG/5ML IV SOLN
0.4000 mg | Freq: Once | INTRAVENOUS | Status: AC
  Administered 2017-11-18: 0.4 mg via INTRAVENOUS

## 2017-11-18 MED ORDER — TECHNETIUM TC 99M TETROFOSMIN IV KIT
10.3000 | PACK | Freq: Once | INTRAVENOUS | Status: AC | PRN
  Administered 2017-11-18: 10.3 via INTRAVENOUS
  Filled 2017-11-18: qty 11

## 2017-11-18 NOTE — Telephone Encounter (Signed)
Notes recorded by Frederik Schmidt, RN on 11/18/2017 at 2:51 PM EDT [id number] 10/4 ------

## 2017-11-18 NOTE — Telephone Encounter (Signed)
-----   Message from Liliane Shi, [place] sent at 11/18/2017  2:29 PM EDT ----- The stress test shows normal blood flow.  The EF is 46%.   Recommendations:  - Continue current medications, follow up as planned.   - Arrange an echocardiogram to assess LVF better  - Please send a copy to the PCP:  Lilian Coma., MD  Richardson Dopp, PA-C    11/18/2017 2:27 PM

## 2017-11-18 NOTE — Telephone Encounter (Signed)
Notes recorded by Frederik Schmidt, RN on 11/18/2017 at 3:07 PM EDT Informed patient of stress test results/recommendations. I told him that we would be contacting him to arrange Echo. He verbalized understanding. ------

## 2017-11-22 ENCOUNTER — Encounter: Payer: Self-pay | Admitting: Physician Assistant

## 2017-11-22 ENCOUNTER — Ambulatory Visit (HOSPITAL_COMMUNITY): Payer: Medicare Other | Attending: Physician Assistant

## 2017-11-22 ENCOUNTER — Other Ambulatory Visit: Payer: Self-pay

## 2017-11-22 DIAGNOSIS — R931 Abnormal findings on diagnostic imaging of heart and coronary circulation: Secondary | ICD-10-CM

## 2017-11-22 DIAGNOSIS — E669 Obesity, unspecified: Secondary | ICD-10-CM | POA: Diagnosis not present

## 2017-11-22 DIAGNOSIS — I10 Essential (primary) hypertension: Secondary | ICD-10-CM | POA: Diagnosis not present

## 2017-11-22 DIAGNOSIS — Z87891 Personal history of nicotine dependence: Secondary | ICD-10-CM | POA: Insufficient documentation

## 2017-11-22 DIAGNOSIS — I255 Ischemic cardiomyopathy: Secondary | ICD-10-CM | POA: Diagnosis not present

## 2017-11-22 DIAGNOSIS — I4891 Unspecified atrial fibrillation: Secondary | ICD-10-CM | POA: Insufficient documentation

## 2017-11-22 DIAGNOSIS — I2510 Atherosclerotic heart disease of native coronary artery without angina pectoris: Secondary | ICD-10-CM | POA: Diagnosis not present

## 2017-11-23 ENCOUNTER — Telehealth: Payer: Self-pay | Admitting: *Deleted

## 2017-11-23 DIAGNOSIS — I2510 Atherosclerotic heart disease of native coronary artery without angina pectoris: Secondary | ICD-10-CM

## 2017-11-23 NOTE — Telephone Encounter (Signed)
Pt has been notified of echo results by phone with verbal understanding. Pt is agreeable to repeat echo to be done in 1 yr. Order will be placed today. Pt thanked me for the call.

## 2017-11-23 NOTE — Telephone Encounter (Signed)
-----   Message from Liliane Shi, [place] sent at 11/22/2017  5:52 PM EDT ----- The echocardiogram shows that his heart function (ejection fraction) is normal.  There is mild stiffness (aortic stenosis) of the aortic valve and mild to moderate leakage of the aortic valve.  The lung pressures are mildly elevated.  The aorta is mildly enlarged. Recommendations:  - Continue current medications and follow up as planned.   - Arrange repeat Echo in 1 year.  Richardson Dopp, PA-C    11/22/2017 5:47 PM

## 2017-12-05 ENCOUNTER — Other Ambulatory Visit: Payer: Self-pay | Admitting: Internal Medicine

## 2017-12-05 DIAGNOSIS — E7800 Pure hypercholesterolemia, unspecified: Secondary | ICD-10-CM

## 2017-12-05 DIAGNOSIS — I2510 Atherosclerotic heart disease of native coronary artery without angina pectoris: Secondary | ICD-10-CM

## 2018-01-08 ENCOUNTER — Other Ambulatory Visit: Payer: Self-pay

## 2018-01-08 ENCOUNTER — Emergency Department (HOSPITAL_BASED_OUTPATIENT_CLINIC_OR_DEPARTMENT_OTHER): Payer: Medicare Other

## 2018-01-08 ENCOUNTER — Encounter (HOSPITAL_BASED_OUTPATIENT_CLINIC_OR_DEPARTMENT_OTHER): Payer: Self-pay | Admitting: Emergency Medicine

## 2018-01-08 ENCOUNTER — Emergency Department (HOSPITAL_BASED_OUTPATIENT_CLINIC_OR_DEPARTMENT_OTHER)
Admission: EM | Admit: 2018-01-08 | Discharge: 2018-01-08 | Disposition: A | Discharge status: Home / Self Care | Payer: Medicare Other | Attending: Emergency Medicine | Admitting: Emergency Medicine

## 2018-01-08 DIAGNOSIS — I480 Paroxysmal atrial fibrillation: Secondary | ICD-10-CM | POA: Diagnosis not present

## 2018-01-08 DIAGNOSIS — I493 Ventricular premature depolarization: Secondary | ICD-10-CM | POA: Diagnosis not present

## 2018-01-08 DIAGNOSIS — Z7982 Long term (current) use of aspirin: Secondary | ICD-10-CM | POA: Diagnosis not present

## 2018-01-08 DIAGNOSIS — K5903 Drug induced constipation: Secondary | ICD-10-CM | POA: Diagnosis not present

## 2018-01-08 DIAGNOSIS — M545 Low back pain, unspecified: Secondary | ICD-10-CM

## 2018-01-08 DIAGNOSIS — Z79899 Other long term (current) drug therapy: Secondary | ICD-10-CM | POA: Insufficient documentation

## 2018-01-08 DIAGNOSIS — I2510 Atherosclerotic heart disease of native coronary artery without angina pectoris: Secondary | ICD-10-CM | POA: Diagnosis not present

## 2018-01-08 DIAGNOSIS — W19XXXA Unspecified fall, initial encounter: Secondary | ICD-10-CM

## 2018-01-08 DIAGNOSIS — M5137 Other intervertebral disc degeneration, lumbosacral region: Secondary | ICD-10-CM | POA: Insufficient documentation

## 2018-01-08 DIAGNOSIS — I429 Cardiomyopathy, unspecified: Secondary | ICD-10-CM | POA: Insufficient documentation

## 2018-01-08 DIAGNOSIS — I10 Essential (primary) hypertension: Secondary | ICD-10-CM | POA: Insufficient documentation

## 2018-01-08 DIAGNOSIS — N261 Atrophy of kidney (terminal): Secondary | ICD-10-CM | POA: Insufficient documentation

## 2018-01-08 DIAGNOSIS — R339 Retention of urine, unspecified: Secondary | ICD-10-CM | POA: Diagnosis present

## 2018-01-08 DIAGNOSIS — Z87891 Personal history of nicotine dependence: Secondary | ICD-10-CM | POA: Diagnosis not present

## 2018-01-08 DIAGNOSIS — J45909 Unspecified asthma, uncomplicated: Secondary | ICD-10-CM | POA: Diagnosis not present

## 2018-01-08 DIAGNOSIS — M5136 Other intervertebral disc degeneration, lumbar region: Secondary | ICD-10-CM

## 2018-01-08 LAB — CBC WITH DIFFERENTIAL/PLATELET
Abs Immature Granulocytes: 0.29 10*3/uL — ABNORMAL HIGH (ref 0.00–0.07)
Basophils Absolute: 0 10*3/uL (ref 0.0–0.1)
Basophils Relative: 0 %
Eosinophils Absolute: 0.1 10*3/uL (ref 0.0–0.5)
Eosinophils Relative: 1 %
HCT: 44.4 % (ref 39.0–52.0)
Hemoglobin: 14.6 g/dL (ref 13.0–17.0)
Immature Granulocytes: 2 %
Lymphocytes Relative: 10 %
Lymphs Abs: 1.6 10*3/uL (ref 0.7–4.0)
MCH: 32.9 pg (ref 26.0–34.0)
MCHC: 32.9 g/dL (ref 30.0–36.0)
MCV: 100 fL (ref 80.0–100.0)
Monocytes Absolute: 1.4 10*3/uL — ABNORMAL HIGH (ref 0.1–1.0)
Monocytes Relative: 9 %
Neutro Abs: 12.8 10*3/uL — ABNORMAL HIGH (ref 1.7–7.7)
Neutrophils Relative %: 78 %
Platelets: 267 10*3/uL (ref 150–400)
RBC: 4.44 MIL/uL (ref 4.22–5.81)
RDW: 15.8 % — ABNORMAL HIGH (ref 11.5–15.5)
WBC: 16.2 10*3/uL — ABNORMAL HIGH (ref 4.0–10.5)
nRBC: 0.2 % (ref 0.0–0.2)

## 2018-01-08 LAB — URINALYSIS, ROUTINE W REFLEX MICROSCOPIC
Bilirubin Urine: NEGATIVE
Glucose, UA: NEGATIVE mg/dL
Hgb urine dipstick: NEGATIVE
Ketones, ur: NEGATIVE mg/dL
Leukocytes, UA: NEGATIVE
Nitrite: NEGATIVE
Protein, ur: NEGATIVE mg/dL
Specific Gravity, Urine: 1.01 (ref 1.005–1.030)
pH: 7 (ref 5.0–8.0)

## 2018-01-08 LAB — BASIC METABOLIC PANEL
Anion gap: 9 (ref 5–15)
BUN: 24 mg/dL — ABNORMAL HIGH (ref 8–23)
CO2: 26 mmol/L (ref 22–32)
Calcium: 9 mg/dL (ref 8.9–10.3)
Chloride: 98 mmol/L (ref 98–111)
Creatinine, Ser: 0.77 mg/dL (ref 0.61–1.24)
GFR calc Af Amer: >60 mL/min (ref >60)
GFR calc non Af Amer: >60 mL/min (ref >60)
Glucose, Bld: 93 mg/dL (ref 70–99)
Potassium: 3.7 mmol/L (ref 3.5–5.1)
Sodium: 133 mmol/L — ABNORMAL LOW (ref 135–145)

## 2018-01-08 LAB — BRAIN NATRIURETIC PEPTIDE: B Natriuretic Peptide: 448.6 pg/mL — ABNORMAL HIGH (ref 0.0–100.0)

## 2018-01-08 MED ORDER — CYCLOBENZAPRINE HCL 10 MG PO TABS
10.0000 mg | ORAL_TABLET | Freq: Once | ORAL | Status: AC
  Administered 2018-01-08: 10 mg via ORAL
  Filled 2018-01-08: qty 1

## 2018-01-08 MED ORDER — KETOROLAC TROMETHAMINE 30 MG/ML IJ SOLN
30.0000 mg | Freq: Once | INTRAMUSCULAR | Status: AC
  Administered 2018-01-08: 30 mg via INTRAVENOUS

## 2018-01-08 MED ORDER — CYCLOBENZAPRINE HCL 10 MG PO TABS: 10.0000 mg | ORAL_TABLET | Freq: Two times a day (BID) | ORAL | 0 refills | Status: DC | PRN

## 2018-01-08 MED ORDER — METOPROLOL TARTRATE 5 MG/5ML IV SOLN
5.0000 mg | Freq: Once | INTRAVENOUS | Status: AC
  Administered 2018-01-08: 5 mg via INTRAVENOUS
  Filled 2018-01-08: qty 5

## 2018-01-08 MED ORDER — KETOROLAC TROMETHAMINE 60 MG/2ML IM SOLN
60.0000 mg | Freq: Once | INTRAMUSCULAR | Status: DC
  Filled 2018-01-08: qty 2

## 2018-01-08 NOTE — ED Notes (Signed)
Unable to get pt for xray, using bedside commode Will try again shortly

## 2018-01-08 NOTE — ED Notes (Signed)
Pt ambulated using walker from home, NAD and unassisted. States he feels like he is walking his normal.

## 2018-01-08 NOTE — ED Notes (Signed)
Pt on cardiac monitor and auto VS 

## 2018-01-08 NOTE — ED Triage Notes (Addendum)
Arrived via [facility] c/o mechanical fall on Friday injuring his head and lower back. Denies LOC from fall. Pain increased with movement. Also reports difficulty urinating, last void 0300. Pt is A&O x 4 on arrival. Pt has shingles, dx on Tuesday.

## 2018-01-08 NOTE — ED Notes (Signed)
Patient transported to X-ray 

## 2018-01-08 NOTE — ED Provider Notes (Signed)
[facility]ENCY DEPARTMENT Provider Note   CSN: [id number] Arrival date & time: 01/08/18  7616     History   Chief Complaint Chief Complaint  Patient presents with  . Back Pain    HPI Larry Church is a 84 y.o. male.  HPI   Reports difficulty urinating beginning during the night Reports constipated then developed difficulty urinating, able to urinate small amount but incomplete emptying Hx of back pain, 3-4 surgeries, has been seeing Dr. Saintclair Halsted and having evaluation, did not think needed surgery over the last few years, [place] Friday morning with worsening back pain and yesterday was worse and worse.  130AM tried to go to the bathroom but was not able to get back into bed because of back pain.  Laying on the left side.  Trying to stand up from sitting position makes it worse.  Lower back pain very low back no radiation.   Reports reaching for something and fell, hurt back. Hit head but no headache. Hx of prior traumatic SAH but this time did not hit head hard.  Not on anticoagulation. Generalized weakness, no vocal numbness or weakness  No nausea or vomiting Difficulty urinating Constipation present since Saturday AM, has had a few more tramadol for the pain and tylenol No fevers No chest pain or shortness of breath Eating and drinking ok  Reports hx of enlarged prostate, had prior urinary retention requiring catheter with constipation at the time    Past Medical History:  Diagnosis Date  . Anxiety   . Arthritis    "bad; all over" (09/24/2014)  . Asthma   . Basal cell carcinoma    "cut and burned off  top of head and face"  . Chronic bronchitis ([place])    "got it q yr til this year" (09/24/2014)  . Chronic lower back pain   . Chronic sinusitis   . Coronary artery disease    LHC 10/15: pLAD 20-30, dLAD 50, origin D1 40; oLCx 40 then irregs; oRI 20, RCA diff irregs up to 10-20  . Degenerative arthritis   . Depression   . Echocardiograms    Echo 10/19: EF  55-60, no RWMA, mild AS (mean 14, peak 26), mild to mod AI, ascending aorta 40 mm, MAC, mild LAE, mild reduced RVSF, mild TR, PASP 43  . GERD (gastroesophageal reflux disease)   . Hypercholesterolemia   . Leg pain    ABIs 11/17: normal  . LV dysfunction    Echo 10/15: Mod LVH, EF 40-50, no apical clot, aortic sclerosis, MAC, trivial MR, mild LAE  . Nuclear stress test    Nuclear stress test 10/19: EF 46, no ischemia; intermediate risk  . OSA on CPAP   . PVC's (premature ventricular contractions)    "I inherited a 4th skip from my mother"  . Seasonal allergies     Patient Active Problem List   Diagnosis Date Noted  . Mild intermittent asthma 01/16/2015  . Obstructive sleep apnea treated with bilevel positive airway pressure (BiPAP) 01/16/2015  . Gastroesophageal reflux disease without esophagitis 01/16/2015  . Current use of beta blocker 01/16/2015  . Acute maxillary sinusitis 01/16/2015  . Acute sinusitis 01/06/2015  . Coughing 01/06/2015  . Acute bronchitis 01/06/2015  . Primary osteoarthritis of left knee 09/24/2014  . Primary osteoarthritis of knee 09/24/2014  . Traumatic subarachnoid hemorrhage ([place]) 02/20/2014  . Fall 02/19/2014  . CAD (coronary artery disease) 12/25/2013  . Cardiomyopathy, ischemic 12/25/2013  . Essential hypertension 12/25/2013  .  Obesity, Class III, BMI 40-49.9 (morbid obesity) ([place]) 12/25/2013  . Dyslipidemia 12/11/2013  . Unstable angina ([place]) 12/10/2013  . Fatigue 10/31/2013  . Screening for lipoid disorders 10/31/2013  . Obstructive apnea 10/31/2013  . Cervical pain 09/26/2013  . Beat, premature ventricular 09/13/2013  . Allergic rhinitis 09/10/2013  . Chronic LBP 09/10/2013  . 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 DDD (degenerative disc disease), lumbosacral 09/10/2013  . Acid reflux 09/10/2013  . History of colon polyps 09/10/2013  . Lumbar canal stenosis 09/10/2013  . Depression, major, recurrent ([place]) 09/10/2013  . Arthritis, degenerative  09/10/2013  . Atrophic kidney 09/10/2013  . Compressed spine fracture ([place]) 09/10/2013    Past Surgical History:  Procedure Laterality Date  . ANTERIOR CERVICAL DECOMP/DISCECTOMY FUSION  1998  . ANTERIOR CERVICAL DISCECTOMY  1989  . APPENDECTOMY  1967  . BACK SURGERY    . BASAL CELL CARCINOMA EXCISION     "top of my head"  . CARPAL TUNNEL RELEASE Bilateral 1980's  . CATARACT EXTRACTION W/ INTRAOCULAR LENS  IMPLANT, BILATERAL Bilateral 2013  . CHOLECYSTECTOMY    . COLONOSCOPY    . CORONARY ANGIOPLASTY WITH STENT PLACEMENT  ~ 1992  . EYE MUSCLE SURGERY Right 2013  . JOINT REPLACEMENT    . LEFT HEART CATHETERIZATION WITH CORONARY ANGIOGRAM N/A 12/10/2013   Procedure: LEFT HEART CATHETERIZATION WITH CORONARY ANGIOGRAM;  Surgeon: Leonie Man, MD;  Location: [facility] CATH LAB;  Service: Cardiovascular;  Laterality: N/A;  . LUMBAR LAMINECTOMY/DECOMPRESSION MICRODISCECTOMY Bilateral 12/04/2012   Procedure: BILATERAL LUMBAR LAMINECTOMY/DECOMPRESSION MICRODISCECTOMY LUMBAR FOUR-TO FIVE SACRALONE;  Surgeon: Elaina Hoops, MD;  Location: [place] [facility]URO ORS;  Service: Neurosurgery;  Laterality: Bilateral;  . REVISION TOTAL HIP ARTHROPLASTY Right 2002   "changed a ball"  . SHOULDER ARTHROSCOPY Bilateral    bone spurs  . TONSILLECTOMY  ~ 1947  . TOTAL HIP ARTHROPLASTY Bilateral 1989-1995   "right-left"  . TOTAL KNEE ARTHROPLASTY Left 09/24/2014  . TOTAL KNEE ARTHROPLASTY Left 09/24/2014   Procedure: TOTAL KNEE ARTHROPLASTY;  Surgeon: Melrose Nakayama, MD;  Location: [place];  Service: Orthopedics;  Laterality: Left;        Home Medications    Prior to Admission medications   Medication Sig Start Date End Date Taking? Authorizing Provider  valACYclovir (VALTREX) 1000 MG tablet Take 1,000 mg by mouth 2 (two) times daily.   Yes [provider]  alfuzosin (UROXATRAL) 10 MG 24 hr tablet Take 1 tablet by mouth daily. 06/30/17   [provider]  aspirin EC 81 MG tablet Take 1 tablet (81 mg  total) by mouth daily. 03/17/17   Fay Records, MD  atorvastatin (LIPITOR) 40 MG tablet TAKE 1 TABLET(40 MG) BY MOUTH DAILY 12/07/17   Fay Records, MD  b complex vitamins tablet Take 1 tablet by mouth daily.    [provider]  cyclobenzaprine (FLEXERIL) 10 MG tablet Take 1 tablet (10 mg total) by mouth 2 (two) times daily as needed for muscle spasms. 01/08/18   Gareth Morgan, MD  Dextromethorphan-Guaifenesin (MUCINEX DM MAXIMUM STRENGTH) 60-1200 MG TB12 Take 1,200 mg by mouth daily as needed (CONGESTION).     [provider]  EPINEPHrine (EPIPEN 2-PAK) 0.3 mg/0.3 mL IJ SOAJ injection Inject 0.3 mLs (0.3 mg total) into the muscle once. 11/25/14   Charlies Silvers, MD  finasteride (PROSCAR) 5 MG tablet Take 5 mg by mouth daily.    [provider]  levocetirizine (XYZAL) 5 MG tablet Take 5 mg by mouth daily.  [provider]  memantine (NAMENDA) 10 MG tablet Take 1 tablet by mouth 2 (two) times daily. 08/17/17   [provider]  metoprolol tartrate (LOPRESSOR) 25 MG tablet TAKE 1/2 TABLET(12.5 MG) BY MOUTH TWICE DAILY 12/07/17   Fay Records, MD  nitroGLYCERIN (NITROSTAT) 0.4 MG SL tablet Place 1 tablet (0.4 mg total) under the tongue every 5 (five) minutes x 3 doses as needed for chest pain. 11/09/17   Richardson Dopp T, PA-C  omeprazole (PRILOSEC) 40 MG capsule Take 1 capsule by mouth 2 (two) times daily. 09/02/17   [provider]  venlafaxine XR (EFFEXOR-XR) 75 MG 24 hr capsule Take 1 capsule by mouth 3 (three) times daily. 07/14/16   [provider]    Family History Family History  Problem Relation Age of Onset  . Cancer Mother   . Cancer Brother   . Heart attack Neg Hx   . Stroke Neg Hx   . Hypertension Neg Hx     Social History Social History   Tobacco Use  . Smoking status: Former Smoker    Packs/day: 1.00    Years: 4.00    Pack years: 4.00    Types: Cigarettes    Last attempt to quit: 02/16/1960    Years since  quitting: 57.9  . Smokeless tobacco: Never Used  Substance Use Topics  . Alcohol use: No  . Drug use: No     Allergies   Penicillins; Bee venom; Donepezil; Methocarbamol; and Other   Review of Systems Review of Systems  Constitutional: Negative for fever.  HENT: Negative for sore throat.   Eyes: Negative for visual disturbance.  Respiratory: Negative for shortness of breath.   Cardiovascular: Negative for chest pain.  Gastrointestinal: Positive for constipation. Negative for abdominal pain, nausea and vomiting.  Genitourinary: Positive for difficulty urinating.  Musculoskeletal: Positive for back pain. Negative for neck stiffness.  Skin: Negative for rash.  Neurological: Negative for syncope, weakness, numbness and headaches.     Physical Exam Updated Vital Signs BP 103/62   Pulse 87   Temp 97.7 F (36.5 C) (Oral)   Resp (!) 22   Ht _0  (1.651 m)   Wt 114.8 kg   SpO2 93%   BMI 42.10 kg/m   Physical Exam  Constitutional: He is oriented to person, place, and time. He appears well-developed and well-nourished. No distress.  HENT:  Head: Normocephalic and atraumatic.  Eyes: Conjunctivae and EOM are normal.  Neck: Normal range of motion.  Cardiovascular: Normal rate, regular rhythm, normal heart sounds and intact distal pulses. Exam reveals no gallop and no friction rub.  No murmur heard. Pulmonary/Chest: Effort normal and breath sounds normal. No respiratory distress. He has no wheezes. He has no rales.  Abdominal: Soft. He exhibits no distension. There is no tenderness. There is no guarding.  Musculoskeletal: He exhibits no edema.  Neurological: He is alert and oriented to person, place, and time.  Skin: Skin is warm and dry. He is not diaphoretic.  Nursing note and vitals reviewed.    ED Treatments / Results  Labs (all labs ordered are listed, but only abnormal results are displayed) Labs Reviewed  URINALYSIS, ROUTINE W REFLEX MICROSCOPIC - Abnormal;  Notable for the following components:      Result Value   Color, Urine STRAW (*)    All other components within normal limits  CBC WITH DIFFERENTIAL/PLATELET - Abnormal; Notable for the following components:   WBC 16.2 (*)    RDW 15.8 (*)  Neutro Abs 12.8 (*)    Monocytes Absolute 1.4 (*)    Abs Immature Granulocytes 0.29 (*)    All other components within normal limits  BASIC METABOLIC PANEL - Abnormal; Notable for the following components:   Sodium 133 (*)    BUN 24 (*)    All other components within normal limits  URINE CULTURE  BRAIN NATRIURETIC PEPTIDE    EKG EKG Interpretation  Date/Time:  _2  y.o. male with CAD, HTN, HL, PVCs, OSA, obesity, prior fall c/b subarachnoid hemorrhage, DJD who presents with concern for fall 2 days ago, with back pain, constipation, urinary retention.  Patient noted to be in atrial for ablation with a rapid rate on arrival to the emergency department.  He has no prior history of atrial fibrillation.  His chads vas score is 5, however he has a history of balance problems, has presented with fall, and also has a history of sub-arachnoid hemorrhage, and do not feel he is an appropriate candidate for anticoagulation given history of bleed and  fall risk.  Discussed this with patient and family, as well as cardiology.    Regarding  atrial fibrillation, patient in and out of atrial fibrillation while in the emergency department.  Initial rates elevated to 130s, however improved following 5 mg of IV metoprolol.  His rate is now in the 80s, and largely in sinus rhythm.  He has not taken his home metoprolol today or yesterday.  Given he is not taking this medication, will not make change in medication dosage at this time, however do recommend continuing his 12.5 mg of metoprolol twice daily, and following up with atrial fibrillation clinic.  Regarding patient's report of urinary retention--he initially presented with concern for constipation and urinary retention.  Suspect constipation is likely secondary to tramadol use which is increased in the setting of recent fall.  H he had a bowel movement on arrival to the emergency department, and after that has had no problems with urination.  There is no signs of prostatitis on my exam.  Urinalysis shows no signs of infection.  Labs show no evidence of change in renal function.  Postvoid residual shows 50 cc in the bladder.  Suspect he had temporary urinary retention secondary to constipation, and he does report a history of that in the past.  Recommend continued avoidance of constipation, monitoring of his symptoms, PCP follow-up.  Do not suspect urinary retention is secondary to cauda equina given normal strength on exam, no saddle anesthesia, no bowel incontinence.  Regarding back pain, this is acute on chronic for this patient, worsened after fall.  X-ray done shows prior compression fractures, without signs of acute fracture.  Suspect possible disc disease, contusion or other MSK etiology of back pain. Doubt AAA, nephrolithiasis, pyelonephritis. No sign of epidural abscess, cauda equina.  He has tramadol at home, recommend he continue to use this prn pain advising risks and will add flexeril and  recommend continuing prednisone he was previously prescribed.  Son reports concerns regarding his balance issues, falls, and wife's abilities to care for him at home.  Placed home health order and case management consult and home health will be out tomorrow for evaluation.  Patient able to ambulate with walker in the ED.      Final Clinical Impressions(s) / ED Diagnoses   Final diagnoses:  Acute bilateral low back pain without sciatica  Fall, initial encounter  Paroxysmal atrial fibrillation ([place])  Drug-induced constipation  Degenerative disc disease, lumbar    ED Discharge Orders         Ordered    cyclobenzaprine (FLEXERIL) 10 MG tablet  2 times daily PRN     01/08/18 1227           Gareth Morgan, MD 01/08/18 1246

## 2018-01-08 NOTE — ED Notes (Signed)
Pt was assisted to bedside commode. Pt felt like he needed to have a BM. Pt has a small stool with urine mixed in

## 2018-01-09 ENCOUNTER — Emergency Department (HOSPITAL_COMMUNITY)
Admission: EM | Admit: 2018-01-09 | Discharge: 2018-01-09 | Discharge status: Absent without leave | Payer: Medicare Other

## 2018-01-09 ENCOUNTER — Other Ambulatory Visit (HOSPITAL_COMMUNITY): Payer: Self-pay | Admitting: *Deleted

## 2018-01-09 LAB — URINE CULTURE: Culture: NO GROWTH

## 2018-01-09 NOTE — Telephone Encounter (Signed)
Pt spouse cld to inquire about a referral from [facility] and asked if it was necessary for the pt to follow up with [place] since he has had afib in past.  I informed since he is not on blood thinner he would need to follow up either with his cardiologist or our clinic.  She prefers Dr. Harrington Challenger; took appt for 01/16/18 incase no appt avail with Dr. Harrington Challenger.

## 2018-01-10 ENCOUNTER — Encounter (HOSPITAL_COMMUNITY): Payer: Self-pay

## 2018-01-10 ENCOUNTER — Emergency Department (HOSPITAL_COMMUNITY): Payer: Medicare Other

## 2018-01-10 ENCOUNTER — Emergency Department (HOSPITAL_COMMUNITY)
Admission: EM | Admit: 2018-01-10 | Discharge: 2018-01-10 | Disposition: A | Discharge status: Home / Self Care | Payer: Medicare Other | Attending: Emergency Medicine | Admitting: Emergency Medicine

## 2018-01-10 DIAGNOSIS — I2510 Atherosclerotic heart disease of native coronary artery without angina pectoris: Secondary | ICD-10-CM | POA: Insufficient documentation

## 2018-01-10 DIAGNOSIS — R1031 Right lower quadrant pain: Secondary | ICD-10-CM

## 2018-01-10 DIAGNOSIS — Z85828 Personal history of other malignant neoplasm of skin: Secondary | ICD-10-CM | POA: Diagnosis not present

## 2018-01-10 DIAGNOSIS — Z96643 Presence of artificial hip joint, bilateral: Secondary | ICD-10-CM | POA: Diagnosis not present

## 2018-01-10 DIAGNOSIS — I10 Essential (primary) hypertension: Secondary | ICD-10-CM | POA: Diagnosis not present

## 2018-01-10 DIAGNOSIS — K5900 Constipation, unspecified: Secondary | ICD-10-CM | POA: Insufficient documentation

## 2018-01-10 DIAGNOSIS — Z87891 Personal history of nicotine dependence: Secondary | ICD-10-CM | POA: Diagnosis not present

## 2018-01-10 DIAGNOSIS — J45909 Unspecified asthma, uncomplicated: Secondary | ICD-10-CM | POA: Insufficient documentation

## 2018-01-10 DIAGNOSIS — Z96652 Presence of left artificial knee joint: Secondary | ICD-10-CM | POA: Insufficient documentation

## 2018-01-10 DIAGNOSIS — Z7982 Long term (current) use of aspirin: Secondary | ICD-10-CM | POA: Diagnosis not present

## 2018-01-10 DIAGNOSIS — Z79899 Other long term (current) drug therapy: Secondary | ICD-10-CM | POA: Insufficient documentation

## 2018-01-10 LAB — COMPREHENSIVE METABOLIC PANEL
ALT: 37 U/L (ref 0–44)
AST: 31 U/L (ref 15–41)
Albumin: 3.6 g/dL (ref 3.5–5.0)
Alkaline Phosphatase: 73 U/L (ref 38–126)
Anion gap: 13 (ref 5–15)
BUN: 25 mg/dL — ABNORMAL HIGH (ref 8–23)
CO2: 25 mmol/L (ref 22–32)
Calcium: 8.9 mg/dL (ref 8.9–10.3)
Chloride: 99 mmol/L (ref 98–111)
Creatinine, Ser: 0.97 mg/dL (ref 0.61–1.24)
GFR calc Af Amer: >60 mL/min (ref >60)
GFR calc non Af Amer: >60 mL/min (ref >60)
Glucose, Bld: 133 mg/dL — ABNORMAL HIGH (ref 70–99)
Potassium: 3.7 mmol/L (ref 3.5–5.1)
Sodium: 137 mmol/L (ref 135–145)
Total Bilirubin: 1.4 mg/dL — ABNORMAL HIGH (ref 0.3–1.2)
Total Protein: 7 g/dL (ref 6.5–8.1)

## 2018-01-10 LAB — CBC
HCT: 44 % (ref 39.0–52.0)
Hemoglobin: 14.6 g/dL (ref 13.0–17.0)
MCH: 34.1 pg — ABNORMAL HIGH (ref 26.0–34.0)
MCHC: 33.2 g/dL (ref 30.0–36.0)
MCV: 102.8 fL — ABNORMAL HIGH (ref 80.0–100.0)
Platelets: 233 10*3/uL (ref 150–400)
RBC: 4.28 MIL/uL (ref 4.22–5.81)
RDW: 16.1 % — ABNORMAL HIGH (ref 11.5–15.5)
WBC: 14.5 10*3/uL — ABNORMAL HIGH (ref 4.0–10.5)
nRBC: 0 % (ref 0.0–0.2)

## 2018-01-10 LAB — URINALYSIS, ROUTINE W REFLEX MICROSCOPIC
Bilirubin Urine: NEGATIVE
Glucose, UA: NEGATIVE mg/dL
Hgb urine dipstick: NEGATIVE
Ketones, ur: NEGATIVE mg/dL
Leukocytes, UA: NEGATIVE
Nitrite: NEGATIVE
Protein, ur: NEGATIVE mg/dL
Specific Gravity, Urine: 1.018 (ref 1.005–1.030)
pH: 6 (ref 5.0–8.0)

## 2018-01-10 LAB — LIPASE, BLOOD: Lipase: 18 U/L (ref 11–51)

## 2018-01-10 MED ORDER — MORPHINE SULFATE (PF) 4 MG/ML IV SOLN: 4.0000 mg | Freq: Once | INTRAVENOUS | Status: DC

## 2018-01-10 MED ORDER — PEG 3350-KCL-NA BICARB-NACL 420 G PO SOLR: 4000.0000 mL | Freq: Once | ORAL | 0 refills | Status: AC

## 2018-01-10 MED ORDER — MORPHINE SULFATE (PF) 2 MG/ML IV SOLN
4.0000 mg | Freq: Once | INTRAVENOUS | Status: AC
  Administered 2018-01-10: 4 mg via INTRAVENOUS
  Filled 2018-01-10: qty 2

## 2018-01-10 NOTE — Discharge Instructions (Addendum)
Magnesium citrate: Drink the entire 10 ounce bottle with equal parts Sprite or Gatorade for relief of constipation.  If no relief with magnesium citrate, fill the prescription for TriLyte you have been given and use as directed.  Return to the emergency department if you develop worsening pain, high fever, bloody stool, or other new and concerning symptoms.

## 2018-01-10 NOTE — ED Triage Notes (Signed)
Pt presents with c/o abdominal pain for several days. Pt reports he came here for this complaint last night but the wait was too long. Pt denies any N/V.

## 2018-01-10 NOTE — ED Provider Notes (Signed)
[place] DEPT Provider Note   CSN: [id number] Arrival date & time: 01/10/18  1111     History   Chief Complaint Chief Complaint  Patient presents with  . Abdominal Pain    HPI Larry Church is a 84 y.o. male.  Patient is an 84 year old male with past medical history of coronary artery disease with stents, arthritis.  He presents today for evaluation of abdominal pain.  He reports a 3-day history of pain in his right flank and right lower quadrant.  This pain occurs intermittently and is sharp and stabbing in nature.  It seems to last for several minutes, then eases up.  He denies any difficulty urinating.  He denies any fevers or chills.  He denies any diarrhea or constipation.  The history is provided by the patient.  Abdominal Pain   This is a new problem. Episode onset: 3 days ago. Episode frequency: Intermittently. The problem has not changed since onset.The pain is associated with an unknown factor. The pain is located in the RLQ (Right flank). The quality of the pain is cramping. The pain is moderate.    Past Medical History:  Diagnosis Date  . Anxiety   . Arthritis    "bad; all over" (09/24/2014)  . Asthma   . Basal cell carcinoma    "cut and burned off  top of head and face"  . Chronic bronchitis ([place])    "got it q yr til this year" (09/24/2014)  . Chronic lower back pain   . Chronic sinusitis   . Coronary artery disease    LHC 10/15: pLAD 20-30, dLAD 50, origin D1 40; oLCx 40 then irregs; oRI 20, RCA diff irregs up to 10-20  . Degenerative arthritis   . Depression   . Echocardiograms    Echo 10/19: EF 55-60, no RWMA, mild AS (mean 14, peak 26), mild to mod AI, ascending aorta 40 mm, MAC, mild LAE, mild reduced RVSF, mild TR, PASP 43  . GERD (gastroesophageal reflux disease)   . Hypercholesterolemia   . Leg pain    ABIs 11/17: normal  . LV dysfunction    Echo 10/15: Mod LVH, EF 40-50, no apical clot, aortic sclerosis, MAC,  trivial MR, mild LAE  . Nuclear stress test    Nuclear stress test 10/19: EF 46, no ischemia; intermediate risk  . OSA on CPAP   . PVC's (premature ventricular contractions)    "I inherited a 4th skip from my mother"  . Seasonal allergies     Patient Active Problem List   Diagnosis Date Noted  . Mild intermittent asthma 01/16/2015  . Obstructive sleep apnea treated with bilevel positive airway pressure (BiPAP) 01/16/2015  . Gastroesophageal reflux disease without esophagitis 01/16/2015  . Current use of beta blocker 01/16/2015  . Acute maxillary sinusitis 01/16/2015  . Acute sinusitis 01/06/2015  . Coughing 01/06/2015  . Acute bronchitis 01/06/2015  . Primary osteoarthritis of left knee 09/24/2014  . Primary osteoarthritis of knee 09/24/2014  . Traumatic subarachnoid hemorrhage ([place]) 02/20/2014  . Fall 02/19/2014  . CAD (coronary artery disease) 12/25/2013  . Cardiomyopathy, ischemic 12/25/2013  . Essential hypertension 12/25/2013  . Obesity, Class III, BMI 40-49.9 (morbid obesity) ([place]) 12/25/2013  . Dyslipidemia 12/11/2013  . Unstable angina ([place]) 12/10/2013  . Fatigue 10/31/2013  . Screening for lipoid disorders 10/31/2013  . Obstructive apnea 10/31/2013  . Cervical pain 09/26/2013  . Beat, premature ventricular 09/13/2013  . Allergic rhinitis 09/10/2013  . Chronic LBP 09/10/2013  .  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 DDD (degenerative disc disease), lumbosacral 09/10/2013  . Acid reflux 09/10/2013  . History of colon polyps 09/10/2013  . Lumbar canal stenosis 09/10/2013  . Depression, major, recurrent ([place]) 09/10/2013  . Arthritis, degenerative 09/10/2013  . Atrophic kidney 09/10/2013  . Compressed spine fracture ([place]) 09/10/2013    Past Surgical History:  Procedure Laterality Date  . ANTERIOR CERVICAL DECOMP/DISCECTOMY FUSION  1998  . ANTERIOR CERVICAL DISCECTOMY  1989  . APPENDECTOMY  1967  . BACK SURGERY    . BASAL CELL CARCINOMA EXCISION     "top of my head"    . CARPAL TUNNEL RELEASE Bilateral 1980's  . CATARACT EXTRACTION W/ INTRAOCULAR LENS  IMPLANT, BILATERAL Bilateral 2013  . CHOLECYSTECTOMY    . COLONOSCOPY    . CORONARY ANGIOPLASTY WITH STENT PLACEMENT  ~ 1992  . EYE MUSCLE SURGERY Right 2013  . JOINT REPLACEMENT    . LEFT HEART CATHETERIZATION WITH CORONARY ANGIOGRAM N/A 12/10/2013   Procedure: LEFT HEART CATHETERIZATION WITH CORONARY ANGIOGRAM;  Surgeon: Leonie Man, MD;  Location: [facility] CATH LAB;  Service: Cardiovascular;  Laterality: N/A;  . LUMBAR LAMINECTOMY/DECOMPRESSION MICRODISCECTOMY Bilateral 12/04/2012   Procedure: BILATERAL LUMBAR LAMINECTOMY/DECOMPRESSION MICRODISCECTOMY LUMBAR FOUR-TO FIVE SACRALONE;  Surgeon: Elaina Hoops, MD;  Location: [place] [facility]UR[facility]S;  Service: Neurosurgery;  Laterality: Bilateral;  . REVISION TOTAL HIP ARTHROPLASTY Right 2002   "changed a ball"  . SHOULDER ARTHROSCOPY Bilateral    bone spurs  . TONSILLECTOMY  ~ 1947  . TOTAL HIP ARTHROPLASTY Bilateral 1989-1995   "right-left"  . TOTAL KNEE ARTHROPLASTY Left 09/24/2014  . TOTAL KNEE ARTHROPLASTY Left 09/24/2014   Procedure: TOTAL KNEE ARTHROPLASTY;  Surgeon: Melrose Nakayama, MD;  Location: [facility];  Service: Orthopedics;  Laterality: Left;        Home Medications    Prior to Admission medications   Medication Sig Start Date End Date Taking? Authorizing Provider  alfuzosin (UROXATRAL) 10 MG 24 hr tablet Take 1 tablet by mouth daily. 06/30/17   [provider]  aspirin EC 81 MG tablet Take 1 tablet (81 mg total) by mouth daily. 03/17/17   Fay Records, MD  atorvastatin (LIPITOR) 40 MG tablet TAKE 1 TABLET(40 MG) BY MOUTH DAILY 12/07/17   Fay Records, MD  b complex vitamins tablet Take 1 tablet by mouth daily.    [provider]  cyclobenzaprine (FLEXERIL) 10 MG tablet Take 1 tablet (10 mg total) by mouth 2 (two) times daily as needed for muscle spasms. 01/08/18   Gareth Morgan, MD  Dextromethorphan-Guaifenesin (MUCINEX DM MAXIMUM  STRENGTH) 60-1200 MG TB12 Take 1,200 mg by mouth daily as needed (CONGESTION).     [provider]  EPINEPHrine (EPIPEN 2-PAK) 0.3 mg/0.3 mL IJ SOAJ injection Inject 0.3 mLs (0.3 mg total) into the muscle once. 11/25/14   Charlies Silvers, MD  finasteride (PROSCAR) 5 MG tablet Take 5 mg by mouth daily.    [provider]  levocetirizine (XYZAL) 5 MG tablet Take 5 mg by mouth daily.    [provider]  memantine (NAMENDA) 10 MG tablet Take 1 tablet by mouth 2 (two) times daily. 08/17/17   [provider]  metoprolol tartrate (LOPRESSOR) 25 MG tablet TAKE 1/2 TABLET(12.5 MG) BY MOUTH TWICE DAILY 12/07/17   Fay Records, MD  nitroGLYCERIN (NITROSTAT) 0.4 MG SL tablet Place 1 tablet (0.4 mg total) under the tongue every 5 (five) minutes x 3 doses as needed for chest pain. 11/09/17  Kathlen Mody, Scott T, PA-C  omeprazole (PRILOSEC) 40 MG capsule Take 1 capsule by mouth 2 (two) times daily. 09/02/17   [provider]  valACYclovir (VALTREX) 1000 MG tablet Take 1,000 mg by mouth 2 (two) times daily.    [provider]  venlafaxine XR (EFFEXOR-XR) 75 MG 24 hr capsule Take 1 capsule by mouth 3 (three) times daily. 07/14/16   [provider]    Family History Family History  Problem Relation Age of Onset  . Cancer Mother   . Cancer Brother   . Heart attack Neg Hx   . Stroke Neg Hx   . Hypertension Neg Hx     Social History Social History   Tobacco Use  . Smoking status: Former Smoker    Packs/day: 1.00    Years: 4.00    Pack years: 4.00    Types: Cigarettes    Last attempt to quit: 02/16/1960    Years since quitting: 57.9  . Smokeless tobacco: Never Used  Substance Use Topics  . Alcohol use: No  . Drug use: No     Allergies   Penicillins; Bee venom; Donepezil; Methocarbamol; and Other   Review of Systems Review of Systems  Gastrointestinal: Positive for abdominal pain.  All other systems reviewed and are  negative.    Physical Exam Updated Vital Signs BP (!) 152/97 (BP Location: Right Arm)   Pulse 61   Temp 98.4 F (36.9 C) (Oral)   Resp 20   Ht _0  (1.651 m)   Wt 113.4 kg   SpO2 95%   BMI 41.60 kg/m   Physical Exam  Constitutional: He is oriented to person, place, and time. He appears well-developed and well-nourished. No distress.  HENT:  Head: Normocephalic and atraumatic.  Mouth/Throat: Oropharynx is clear and moist.  Neck: Normal range of motion. Neck supple.  Cardiovascular: Normal rate and regular rhythm. Exam reveals no friction rub.  No murmur heard. Pulmonary/Chest: Effort normal and breath sounds normal. No respiratory distress. He has no wheezes. He has no rales.  Abdominal: Soft. Bowel sounds are normal. He exhibits no distension. There is tenderness in the right lower quadrant.  There is tenderness to palpation in the right lower quadrant and right flank.  Musculoskeletal: Normal range of motion. He exhibits no edema.  Neurological: He is alert and oriented to person, place, and time. Coordination normal.  Skin: Skin is warm and dry. He is not diaphoretic.  Nursing note and vitals reviewed.    ED Treatments / Results  Labs (all labs ordered are listed, but only abnormal results are displayed) Labs Reviewed  CBC - Abnormal; Notable for the following components:      Result Value   WBC 14.5 (*)    MCV 102.8 (*)    MCH 34.1 (*)    RDW 16.1 (*)    All other components within normal limits  URINALYSIS, ROUTINE W REFLEX MICROSCOPIC  LIPASE, BLOOD  COMPREHENSIVE METABOLIC PANEL    EKG None  Radiology No results found.  Procedures Procedures (including critical care time)  Medications Ordered in ED Medications  morphine 4 MG/ML injection 4 mg (has no administration in time range)     Initial Impression / Assessment and Plan / ED Course  I have reviewed the triage vital signs and the nursing notes.  Pertinent labs & imaging results that were  available during my care of the patient were reviewed by me and considered in my medical decision making (see chart for details).  Patient is an 84 year old male presenting with complaints of right sided, crampy abdominal pain for the past several days.  He has a white count of 14,000 which appears consistent with baseline.  CT scan obtained shows stool in the right colon that appears to be consistent with the site of his pain and tenderness.  There are no other abnormalities seen on CT scan to account for his symptoms and I believe he is appropriate for discharge.  I will recommend magnesium citrate and follow-up as needed.  Final Clinical Impressions(s) / ED Diagnoses   Final diagnoses:  None    ED Discharge Orders    None       Veryl Speak, MD 01/10/18 1501

## 2018-01-11 ENCOUNTER — Ambulatory Visit: Payer: Medicare Other | Admitting: Physician Assistant

## 2018-01-14 ENCOUNTER — Emergency Department (HOSPITAL_COMMUNITY): Payer: Medicare Other

## 2018-01-14 ENCOUNTER — Other Ambulatory Visit: Payer: Self-pay

## 2018-01-14 ENCOUNTER — Encounter (HOSPITAL_COMMUNITY): Payer: Self-pay

## 2018-01-14 ENCOUNTER — Inpatient Hospital Stay (HOSPITAL_COMMUNITY)
Admission: EM | Admit: 2018-01-14 | Discharge: 2018-01-19 | DRG: 543 | Disposition: A | Discharge status: Home / Self Care | Payer: Medicare Other | Attending: Internal Medicine | Admitting: Internal Medicine

## 2018-01-14 DIAGNOSIS — E7800 Pure hypercholesterolemia, unspecified: Secondary | ICD-10-CM | POA: Diagnosis present

## 2018-01-14 DIAGNOSIS — M545 Low back pain, unspecified: Secondary | ICD-10-CM | POA: Diagnosis present

## 2018-01-14 DIAGNOSIS — K219 Gastro-esophageal reflux disease without esophagitis: Secondary | ICD-10-CM | POA: Diagnosis present

## 2018-01-14 DIAGNOSIS — Z7982 Long term (current) use of aspirin: Secondary | ICD-10-CM

## 2018-01-14 DIAGNOSIS — W19XXXA Unspecified fall, initial encounter: Secondary | ICD-10-CM | POA: Diagnosis not present

## 2018-01-14 DIAGNOSIS — J42 Unspecified chronic bronchitis: Secondary | ICD-10-CM | POA: Diagnosis present

## 2018-01-14 DIAGNOSIS — J45909 Unspecified asthma, uncomplicated: Secondary | ICD-10-CM | POA: Diagnosis present

## 2018-01-14 DIAGNOSIS — R1084 Generalized abdominal pain: Secondary | ICD-10-CM | POA: Diagnosis present

## 2018-01-14 DIAGNOSIS — I4891 Unspecified atrial fibrillation: Secondary | ICD-10-CM

## 2018-01-14 DIAGNOSIS — Z961 Presence of intraocular lens: Secondary | ICD-10-CM | POA: Diagnosis present

## 2018-01-14 DIAGNOSIS — Z85828 Personal history of other malignant neoplasm of skin: Secondary | ICD-10-CM

## 2018-01-14 DIAGNOSIS — I959 Hypotension, unspecified: Secondary | ICD-10-CM | POA: Diagnosis not present

## 2018-01-14 DIAGNOSIS — M4856XA Collapsed vertebra, not elsewhere classified, lumbar region, initial encounter for fracture: Secondary | ICD-10-CM | POA: Diagnosis not present

## 2018-01-14 DIAGNOSIS — G4733 Obstructive sleep apnea (adult) (pediatric): Secondary | ICD-10-CM | POA: Diagnosis present

## 2018-01-14 DIAGNOSIS — Z888 Allergy status to other drugs, medicaments and biological substances status: Secondary | ICD-10-CM

## 2018-01-14 DIAGNOSIS — G8929 Other chronic pain: Secondary | ICD-10-CM

## 2018-01-14 DIAGNOSIS — Z96651 Presence of right artificial knee joint: Secondary | ICD-10-CM | POA: Diagnosis present

## 2018-01-14 DIAGNOSIS — Z87891 Personal history of nicotine dependence: Secondary | ICD-10-CM

## 2018-01-14 DIAGNOSIS — Z96643 Presence of artificial hip joint, bilateral: Secondary | ICD-10-CM | POA: Diagnosis present

## 2018-01-14 DIAGNOSIS — I2510 Atherosclerotic heart disease of native coronary artery without angina pectoris: Secondary | ICD-10-CM | POA: Diagnosis present

## 2018-01-14 DIAGNOSIS — F329 Major depressive disorder, single episode, unspecified: Secondary | ICD-10-CM | POA: Diagnosis present

## 2018-01-14 DIAGNOSIS — K5909 Other constipation: Secondary | ICD-10-CM | POA: Diagnosis present

## 2018-01-14 DIAGNOSIS — Z955 Presence of coronary angioplasty implant and graft: Secondary | ICD-10-CM

## 2018-01-14 DIAGNOSIS — Z9841 Cataract extraction status, right eye: Secondary | ICD-10-CM

## 2018-01-14 DIAGNOSIS — F05 Delirium due to known physiological condition: Secondary | ICD-10-CM | POA: Diagnosis not present

## 2018-01-14 DIAGNOSIS — J329 Chronic sinusitis, unspecified: Secondary | ICD-10-CM | POA: Diagnosis present

## 2018-01-14 DIAGNOSIS — R1031 Right lower quadrant pain: Secondary | ICD-10-CM | POA: Diagnosis not present

## 2018-01-14 DIAGNOSIS — F419 Anxiety disorder, unspecified: Secondary | ICD-10-CM | POA: Diagnosis present

## 2018-01-14 DIAGNOSIS — Z880 Allergy status to penicillin: Secondary | ICD-10-CM

## 2018-01-14 DIAGNOSIS — Z86010 Personal history of colonic polyps: Secondary | ICD-10-CM

## 2018-01-14 DIAGNOSIS — W1830XA Fall on same level, unspecified, initial encounter: Secondary | ICD-10-CM | POA: Diagnosis present

## 2018-01-14 DIAGNOSIS — Z9842 Cataract extraction status, left eye: Secondary | ICD-10-CM

## 2018-01-14 DIAGNOSIS — R296 Repeated falls: Secondary | ICD-10-CM | POA: Diagnosis present

## 2018-01-14 DIAGNOSIS — S32010A Wedge compression fracture of first lumbar vertebra, initial encounter for closed fracture: Secondary | ICD-10-CM

## 2018-01-14 DIAGNOSIS — I119 Hypertensive heart disease without heart failure: Secondary | ICD-10-CM | POA: Diagnosis present

## 2018-01-14 DIAGNOSIS — N400 Enlarged prostate without lower urinary tract symptoms: Secondary | ICD-10-CM | POA: Diagnosis present

## 2018-01-14 DIAGNOSIS — Z6841 Body Mass Index (BMI) 40.0 and over, adult: Secondary | ICD-10-CM

## 2018-01-14 DIAGNOSIS — M1990 Unspecified osteoarthritis, unspecified site: Secondary | ICD-10-CM | POA: Diagnosis present

## 2018-01-14 DIAGNOSIS — D7589 Other specified diseases of blood and blood-forming organs: Secondary | ICD-10-CM | POA: Diagnosis present

## 2018-01-14 DIAGNOSIS — N401 Enlarged prostate with lower urinary tract symptoms: Secondary | ICD-10-CM

## 2018-01-14 DIAGNOSIS — G3184 Mild cognitive impairment, so stated: Secondary | ICD-10-CM | POA: Diagnosis present

## 2018-01-14 DIAGNOSIS — I493 Ventricular premature depolarization: Secondary | ICD-10-CM | POA: Diagnosis present

## 2018-01-14 DIAGNOSIS — J9601 Acute respiratory failure with hypoxia: Secondary | ICD-10-CM

## 2018-01-14 DIAGNOSIS — Z79899 Other long term (current) drug therapy: Secondary | ICD-10-CM

## 2018-01-14 DIAGNOSIS — I4811 Longstanding persistent atrial fibrillation: Secondary | ICD-10-CM

## 2018-01-14 DIAGNOSIS — Z91030 Bee allergy status: Secondary | ICD-10-CM

## 2018-01-14 LAB — COMPREHENSIVE METABOLIC PANEL
ALT: 34 U/L (ref 0–44)
AST: 30 U/L (ref 15–41)
Albumin: 3.3 g/dL — ABNORMAL LOW (ref 3.5–5.0)
Alkaline Phosphatase: 77 U/L (ref 38–126)
Anion gap: 11 (ref 5–15)
BUN: 16 mg/dL (ref 8–23)
CO2: 27 mmol/L (ref 22–32)
Calcium: 9.1 mg/dL (ref 8.9–10.3)
Chloride: 99 mmol/L (ref 98–111)
Creatinine, Ser: 0.95 mg/dL (ref 0.61–1.24)
GFR calc Af Amer: >60 mL/min (ref >60)
GFR calc non Af Amer: >60 mL/min (ref >60)
Glucose, Bld: 128 mg/dL — ABNORMAL HIGH (ref 70–99)
Potassium: 3.9 mmol/L (ref 3.5–5.1)
Sodium: 137 mmol/L (ref 135–145)
Total Bilirubin: 0.9 mg/dL (ref 0.3–1.2)
Total Protein: 6.6 g/dL (ref 6.5–8.1)

## 2018-01-14 LAB — URINALYSIS, ROUTINE W REFLEX MICROSCOPIC
Bilirubin Urine: NEGATIVE
Glucose, UA: NEGATIVE mg/dL
Hgb urine dipstick: NEGATIVE
Ketones, ur: 5 mg/dL — AB
Leukocytes, UA: NEGATIVE
Nitrite: NEGATIVE
Protein, ur: NEGATIVE mg/dL
Specific Gravity, Urine: 1.016 (ref 1.005–1.030)
pH: 6 (ref 5.0–8.0)

## 2018-01-14 LAB — CBC WITH DIFFERENTIAL/PLATELET
Abs Immature Granulocytes: 0.13 10*3/uL — ABNORMAL HIGH (ref 0.00–0.07)
Basophils Absolute: 0 10*3/uL (ref 0.0–0.1)
Basophils Relative: 0 %
Eosinophils Absolute: 0.3 10*3/uL (ref 0.0–0.5)
Eosinophils Relative: 2 %
HCT: 46 % (ref 39.0–52.0)
Hemoglobin: 14.9 g/dL (ref 13.0–17.0)
Immature Granulocytes: 1 %
Lymphocytes Relative: 6 %
Lymphs Abs: 0.9 10*3/uL (ref 0.7–4.0)
MCH: 33.9 pg (ref 26.0–34.0)
MCHC: 32.4 g/dL (ref 30.0–36.0)
MCV: 104.5 fL — ABNORMAL HIGH (ref 80.0–100.0)
Monocytes Absolute: 1.2 10*3/uL — ABNORMAL HIGH (ref 0.1–1.0)
Monocytes Relative: 8 %
Neutro Abs: 12.6 10*3/uL — ABNORMAL HIGH (ref 1.7–7.7)
Neutrophils Relative %: 83 %
Platelets: 242 10*3/uL (ref 150–400)
RBC: 4.4 MIL/uL (ref 4.22–5.81)
RDW: 15.8 % — ABNORMAL HIGH (ref 11.5–15.5)
WBC: 15.1 10*3/uL — ABNORMAL HIGH (ref 4.0–10.5)
nRBC: 0 % (ref 0.0–0.2)

## 2018-01-14 LAB — LIPASE, BLOOD: Lipase: 19 U/L (ref 11–51)

## 2018-01-14 MED ORDER — MEMANTINE HCL 10 MG PO TABS
10.0000 mg | ORAL_TABLET | Freq: Two times a day (BID) | ORAL | Status: DC
  Administered 2018-01-14 – 2018-01-19 (×10): 10 mg via ORAL
  Filled 2018-01-14 (×10): qty 1

## 2018-01-14 MED ORDER — SODIUM CHLORIDE (PF) 0.9 % IJ SOLN
INTRAMUSCULAR | Status: AC
  Filled 2018-01-14: qty 50

## 2018-01-14 MED ORDER — ASPIRIN EC 81 MG PO TBEC
81.0000 mg | DELAYED_RELEASE_TABLET | Freq: Every day | ORAL | Status: DC
  Administered 2018-01-15 – 2018-01-19 (×5): 81 mg via ORAL
  Filled 2018-01-14 (×5): qty 1

## 2018-01-14 MED ORDER — ENOXAPARIN SODIUM 150 MG/ML ~~LOC~~ SOLN
55.0000 mg | Freq: Every day | SUBCUTANEOUS | Status: DC
  Filled 2018-01-14: qty 0.37

## 2018-01-14 MED ORDER — MORPHINE SULFATE (PF) 4 MG/ML IV SOLN
4.0000 mg | Freq: Once | INTRAVENOUS | Status: AC
  Administered 2018-01-14: 4 mg via INTRAVENOUS
  Filled 2018-01-14: qty 1

## 2018-01-14 MED ORDER — LEVOCETIRIZINE DIHYDROCHLORIDE 5 MG PO TABS: 5.0000 mg | ORAL_TABLET | Freq: Every day | ORAL | Status: DC

## 2018-01-14 MED ORDER — IOPAMIDOL (ISOVUE-300) INJECTION 61%
100.0000 mL | Freq: Once | INTRAVENOUS | Status: AC | PRN
  Administered 2018-01-14: 100 mL via INTRAVENOUS

## 2018-01-14 MED ORDER — METOPROLOL TARTRATE 5 MG/5ML IV SOLN
2.5000 mg | Freq: Once | INTRAVENOUS | Status: AC
  Administered 2018-01-15: 2.5 mg via INTRAVENOUS
  Filled 2018-01-14: qty 5

## 2018-01-14 MED ORDER — METOPROLOL TARTRATE 25 MG PO TABS
25.0000 mg | ORAL_TABLET | Freq: Two times a day (BID) | ORAL | Status: DC
  Administered 2018-01-15 – 2018-01-19 (×9): 25 mg via ORAL
  Filled 2018-01-14 (×9): qty 1

## 2018-01-14 MED ORDER — SENNA 8.6 MG PO TABS
1.0000 | ORAL_TABLET | Freq: Every day | ORAL | Status: DC
  Administered 2018-01-15 – 2018-01-19 (×4): 8.6 mg via ORAL
  Filled 2018-01-14 (×4): qty 1

## 2018-01-14 MED ORDER — LORATADINE 10 MG PO TABS
10.0000 mg | ORAL_TABLET | Freq: Every day | ORAL | Status: DC
  Administered 2018-01-15 – 2018-01-19 (×5): 10 mg via ORAL
  Filled 2018-01-14 (×5): qty 1

## 2018-01-14 MED ORDER — ATORVASTATIN CALCIUM 40 MG PO TABS
40.0000 mg | ORAL_TABLET | Freq: Every day | ORAL | Status: DC
  Administered 2018-01-14 – 2018-01-18 (×5): 40 mg via ORAL
  Filled 2018-01-14 (×5): qty 1

## 2018-01-14 MED ORDER — SODIUM CHLORIDE 0.9 % IV BOLUS
1000.0000 mL | Freq: Once | INTRAVENOUS | Status: AC
  Administered 2018-01-14: 1000 mL via INTRAVENOUS

## 2018-01-14 MED ORDER — FINASTERIDE 5 MG PO TABS
5.0000 mg | ORAL_TABLET | Freq: Every day | ORAL | Status: DC
  Administered 2018-01-15 – 2018-01-19 (×5): 5 mg via ORAL
  Filled 2018-01-14 (×5): qty 1

## 2018-01-14 MED ORDER — TRAMADOL HCL 50 MG PO TABS
50.0000 mg | ORAL_TABLET | ORAL | Status: DC | PRN
  Administered 2018-01-15 – 2018-01-19 (×8): 50 mg via ORAL
  Filled 2018-01-14 (×8): qty 1

## 2018-01-14 MED ORDER — CYCLOBENZAPRINE HCL 10 MG PO TABS
10.0000 mg | ORAL_TABLET | Freq: Two times a day (BID) | ORAL | Status: DC | PRN
  Administered 2018-01-17: 10 mg via ORAL
  Filled 2018-01-14: qty 1

## 2018-01-14 MED ORDER — PANTOPRAZOLE SODIUM 40 MG PO TBEC
40.0000 mg | DELAYED_RELEASE_TABLET | Freq: Every day | ORAL | Status: DC
  Administered 2018-01-15 – 2018-01-19 (×5): 40 mg via ORAL
  Filled 2018-01-14 (×5): qty 1

## 2018-01-14 MED ORDER — IOPAMIDOL (ISOVUE-300) INJECTION 61%
INTRAVENOUS | Status: AC
  Filled 2018-01-14: qty 100

## 2018-01-14 MED ORDER — ONDANSETRON HCL 4 MG/2ML IJ SOLN
4.0000 mg | Freq: Once | INTRAMUSCULAR | Status: AC
  Administered 2018-01-14: 4 mg via INTRAVENOUS
  Filled 2018-01-14: qty 2

## 2018-01-14 MED ORDER — ALFUZOSIN HCL ER 10 MG PO TB24
10.0000 mg | ORAL_TABLET | Freq: Every day | ORAL | Status: DC
  Administered 2018-01-15 – 2018-01-19 (×5): 10 mg via ORAL
  Filled 2018-01-14 (×5): qty 1

## 2018-01-14 MED ORDER — METOPROLOL TARTRATE 5 MG/5ML IV SOLN: 5.0000 mg | Freq: Once | INTRAVENOUS | Status: DC

## 2018-01-14 MED ORDER — SODIUM CHLORIDE 0.9% FLUSH
3.0000 mL | Freq: Two times a day (BID) | INTRAVENOUS | Status: DC
  Administered 2018-01-14 – 2018-01-19 (×10): 3 mL via INTRAVENOUS

## 2018-01-14 MED ORDER — METOPROLOL TARTRATE 25 MG PO TABS
25.0000 mg | ORAL_TABLET | Freq: Once | ORAL | Status: AC
  Administered 2018-01-14: 25 mg via ORAL
  Filled 2018-01-14: qty 1

## 2018-01-14 MED ORDER — VENLAFAXINE HCL ER 150 MG PO CP24
150.0000 mg | ORAL_CAPSULE | Freq: Every day | ORAL | Status: DC
  Administered 2018-01-15 – 2018-01-19 (×5): 150 mg via ORAL
  Filled 2018-01-14 (×5): qty 1

## 2018-01-14 MED ORDER — SODIUM CHLORIDE 0.9% FLUSH: 3.0000 mL | INTRAVENOUS | Status: DC | PRN

## 2018-01-14 MED ORDER — SODIUM CHLORIDE 0.9 % IV SOLN: 250.0000 mL | INTRAVENOUS | Status: DC | PRN

## 2018-01-14 NOTE — ED Provider Notes (Signed)
[facility] [facility] DEPT Provider Note   CSN: [id number] Arrival date & time: 01/14/18  1329     History   Chief Complaint Chief Complaint  Patient presents with  . Abdominal Pain    HPI Larry Church is a 84 y.o. male.  Patient is an 84 year old male with past medical history of coronary artery disease, hypertension, asthma, and arthritis.  He presents today for evaluation of abdominal pain.  He has been seen on 2 prior occasions with similar complaints.  Most recent visit was 4 days ago during which time he underwent a CT scan with renal protocol.  This revealed stool in the right colon, however no other obvious abnormality.  He was discharged with magnesium citrate which resulted in several bowel movements, however did not relieve his pain.  He denies any fevers or chills.  He denies any bloody stools.  He denies any vomiting.  The history is provided by the patient.  Abdominal Pain   This is a new problem. Episode onset: 1 week. The problem occurs constantly. The problem has been gradually worsening. The pain is associated with an unknown factor. The pain is located in the RLQ and RUQ. The pain is moderate. Associated symptoms include constipation. Pertinent negatives include fever, melena, nausea, vomiting, dysuria and frequency. The symptoms are aggravated by palpation and certain positions. Nothing relieves the symptoms.    Past Medical History:  Diagnosis Date  . Anxiety   . Arthritis    "bad; all over" (09/24/2014)  . Asthma   . Basal cell carcinoma    "cut and burned off  top of head and face"  . Chronic bronchitis ([place])    "got it q yr til this year" (09/24/2014)  . Chronic lower back pain   . Chronic sinusitis   . Coronary artery disease    LHC 10/15: pLAD 20-30, dLAD 50, origin D1 40; oLCx 40 then irregs; oRI 20, RCA diff irregs up to 10-20  . Degenerative arthritis   . Depression   . Echocardiograms    Echo 10/19: EF 55-60, no RWMA, mild AS  (mean 14, peak 26), mild to mod AI, ascending aorta 40 mm, MAC, mild LAE, mild reduced RVSF, mild TR, PASP 43  . GERD (gastroesophageal reflux disease)   . Hypercholesterolemia   . Leg pain    ABIs 11/17: normal  . LV dysfunction    Echo 10/15: Mod LVH, EF 40-50, no apical clot, aortic sclerosis, MAC, trivial MR, mild LAE  . Nuclear stress test    Nuclear stress test 10/19: EF 46, no ischemia; intermediate risk  . OSA on CPAP   . PVC's (premature ventricular contractions)    "I inherited a 4th skip from my mother"  . Seasonal allergies     Patient Active Problem List   Diagnosis Date Noted  . Mild intermittent asthma 01/16/2015  . Obstructive sleep apnea treated with bilevel positive airway pressure (BiPAP) 01/16/2015  . Gastroesophageal reflux disease without esophagitis 01/16/2015  . Current use of beta blocker 01/16/2015  . Acute maxillary sinusitis 01/16/2015  . Acute sinusitis 01/06/2015  . Coughing 01/06/2015  . Acute bronchitis 01/06/2015  . Primary osteoarthritis of left knee 09/24/2014  . Primary osteoarthritis of knee 09/24/2014  . Traumatic subarachnoid hemorrhage ([place]) 02/20/2014  . Fall 02/19/2014  . CAD (coronary artery disease) 12/25/2013  . Cardiomyopathy, ischemic 12/25/2013  . Essential hypertension 12/25/2013  . Obesity, Class III, BMI 40-49.9 (morbid obesity) ([place]) 12/25/2013  . Dyslipidemia 12/11/2013  .  Unstable angina ([place]) 12/10/2013  . Fatigue 10/31/2013  . Screening for lipoid disorders 10/31/2013  . Obstructive apnea 10/31/2013  . Cervical pain 09/26/2013  . Beat, premature ventricular 09/13/2013  . Allergic rhinitis 09/10/2013  . Chronic LBP 09/10/2013  . 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 DDD (degenerative disc disease), lumbosacral 09/10/2013  . Acid reflux 09/10/2013  . History of colon polyps 09/10/2013  . Lumbar canal stenosis 09/10/2013  . Depression, major, recurrent ([place]) 09/10/2013  . Arthritis, degenerative 09/10/2013  . Atrophic  kidney 09/10/2013  . Compressed spine fracture ([place]) 09/10/2013    Past Surgical History:  Procedure Laterality Date  . ANTERIOR CERVICAL DECOMP/DISCECTOMY FUSION  1998  . ANTERIOR CERVICAL DISCECTOMY  1989  . APPENDECTOMY  1967  . BACK SURGERY    . BASAL CELL CARCINOMA EXCISION     "top of my head"  . CARPAL TUNNEL RELEASE Bilateral 1980's  . CATARACT EXTRACTION W/ INTRAOCULAR LENS  IMPLANT, BILATERAL Bilateral 2013  . CHOLECYSTECTOMY    . COLONOSCOPY    . CORONARY ANGIOPLASTY WITH STENT PLACEMENT  ~ 1992  . EYE MUSCLE SURGERY Right 2013  . JOINT REPLACEMENT    . LEFT HEART CATHETERIZATION WITH CORONARY ANGIOGRAM N/A 12/10/2013   Procedure: LEFT HEART CATHETERIZATION WITH CORONARY ANGIOGRAM;  Surgeon: Leonie Man, MD;  Location: [facility] CATH LAB;  Service: Cardiovascular;  Laterality: N/A;  . LUMBAR LAMINECTOMY/DECOMPRESSION MICRODISCECTOMY Bilateral 12/04/2012   Procedure: BILATERAL LUMBAR LAMINECTOMY/DECOMPRESSION MICRODISCECTOMY LUMBAR FOUR-TO FIVE SACRALONE;  Surgeon: Elaina Hoops, MD;  Location: [facility];  Service: Neurosurgery;  Laterality: Bilateral;  . REVISION TOTAL HIP ARTHROPLASTY Right 2002   "changed a ball"  . SHOULDER ARTHROSCOPY Bilateral    bone spurs  . TONSILLECTOMY  ~ 1947  . TOTAL HIP ARTHROPLASTY Bilateral 1989-1995   "right-left"  . TOTAL KNEE ARTHROPLASTY Left 09/24/2014  . TOTAL KNEE ARTHROPLASTY Left 09/24/2014   Procedure: TOTAL KNEE ARTHROPLASTY;  Surgeon: Melrose Nakayama, MD;  Location: [facility];  Service: Orthopedics;  Laterality: Left;        Home Medications    Prior to Admission medications   Medication Sig Start Date End Date Taking? Authorizing Provider  acetaminophen (TYLENOL) 500 MG tablet Take 500-1,000 mg by mouth every 6 (six) hours as needed for mild pain.   Yes [provider]  alfuzosin (UROXATRAL) 10 MG 24 hr tablet Take 10 mg by mouth daily.  06/30/17  Yes [provider]  aspirin EC 81 MG tablet Take 1 tablet (81 mg  total) by mouth daily. 03/17/17  Yes Fay Records, MD  atorvastatin (LIPITOR) 40 MG tablet TAKE 1 TABLET(40 MG) BY MOUTH DAILY Patient taking differently: Take 40 mg by mouth at bedtime.  12/07/17  Yes Fay Records, MD  b complex vitamins tablet Take 1 tablet by mouth daily.   Yes [provider]  Dextromethorphan-Guaifenesin (MUCINEX DM MAXIMUM STRENGTH) 60-1200 MG TB12 Take 1 tablet by mouth every 12 (twelve) hours as needed (congestion).    Yes [provider]  EPINEPHrine (EPIPEN 2-PAK) 0.3 mg/0.3 mL IJ SOAJ injection Inject 0.3 mLs (0.3 mg total) into the muscle once. 11/25/14  Yes Bardelas, Jens Som, MD  finasteride (PROSCAR) 5 MG tablet Take 5 mg by mouth daily.   Yes [provider]  levocetirizine (XYZAL) 5 MG tablet Take 5 mg by mouth daily.   Yes [provider]  memantine (NAMENDA) 10 MG tablet Take 10 mg by mouth 2 (two) times daily.  08/17/17  Yes  [provider]  metoprolol tartrate (LOPRESSOR) 25 MG tablet TAKE 1/2 TABLET(12.5 MG) BY MOUTH TWICE DAILY Patient taking differently: Take 12.5 mg by mouth 2 (two) times daily.  12/07/17  Yes Fay Records, MD  nitroGLYCERIN (NITROSTAT) 0.4 MG SL tablet Place 1 tablet (0.4 mg total) under the tongue every 5 (five) minutes x 3 doses as needed for chest pain. 11/09/17  Yes Weaver, Scott T, PA-C  omeprazole (PRILOSEC) 40 MG capsule Take 40 mg by mouth 2 (two) times daily.  09/02/17  Yes [provider]  traMADol (ULTRAM) 50 MG tablet Take 50 mg by mouth every 4 (four) hours as needed for moderate pain.  12/20/17  Yes [provider]  venlafaxine XR (EFFEXOR-XR) 75 MG 24 hr capsule Take 75-150 capsules by mouth See admin instructions. Take 150 mg by mouth in the morning and 75 mg by mouth at bedtime 07/14/16  Yes [provider]  cyclobenzaprine (FLEXERIL) 10 MG tablet Take 1 tablet (10 mg total) by mouth 2 (two) times daily as needed for muscle spasms. 01/08/18   Gareth Morgan,  MD    Family History Family History  Problem Relation Age of Onset  . Cancer Mother   . Cancer Brother   . Heart attack Neg Hx   . Stroke Neg Hx   . Hypertension Neg Hx     Social History Social History   Tobacco Use  . Smoking status: Former Smoker    Packs/day: 1.00    Years: 4.00    Pack years: 4.00    Types: Cigarettes    Last attempt to quit: 02/16/1960    Years since quitting: 57.9  . Smokeless tobacco: Never Used  Substance Use Topics  . Alcohol use: No  . Drug use: No     Allergies   Penicillins; Bee venom; Donepezil; and Methocarbamol   Review of Systems Review of Systems  Constitutional: Negative for fever.  Gastrointestinal: Positive for abdominal pain and constipation. Negative for melena, nausea and vomiting.  Genitourinary: Negative for dysuria and frequency.  All other systems reviewed and are negative.    Physical Exam Updated Vital Signs BP 136/85 (BP Location: Right Arm)   Pulse 69   Temp 97.8 F (36.6 C) (Oral)   Resp 16   Ht 5' 5"  (1.651 m)   Wt 113.4 kg   SpO2 92%   BMI 41.60 kg/m   Physical Exam  Constitutional: He is oriented to person, place, and time. He appears well-developed and well-nourished. No distress.  HENT:  Head: Normocephalic and atraumatic.  Mouth/Throat: Oropharynx is clear and moist.  Neck: Normal range of motion. Neck supple.  Cardiovascular: Normal rate and regular rhythm. Exam reveals no friction rub.  No murmur heard. Pulmonary/Chest: Effort normal and breath sounds normal. No respiratory distress. He has no wheezes. He has no rales.  Abdominal: Soft. Bowel sounds are normal. He exhibits no distension. There is tenderness in the right upper quadrant and right lower quadrant. There is no rigidity, no rebound and no guarding.  Musculoskeletal: Normal range of motion. He exhibits no edema.  Neurological: He is alert and oriented to person, place, and time. Coordination normal.  Skin: Skin is warm and dry. He is  not diaphoretic.  Nursing note and vitals reviewed.    ED Treatments / Results  Labs (all labs ordered are listed, but only abnormal results are displayed) Labs Reviewed - No data to display  EKG None  Radiology No results found.  Procedures Procedures (  including critical care time)  Medications Ordered in ED Medications  sodium chloride 0.9 % bolus 1,000 mL (has no administration in time range)  morphine 4 MG/ML injection 4 mg (has no administration in time range)  ondansetron (ZOFRAN) injection 4 mg (has no administration in time range)     Initial Impression / Assessment and Plan / ED Course  I have reviewed the triage vital signs and the nursing notes.  Pertinent labs & imaging results that were available during my care of the patient were reviewed by me and considered in my medical decision making (see chart for details).  Patient with a one-week history of abdominal pains that have been unexplained by 3 emergency department visits.  He seems to run a chronically elevated white count and today is no different.  His white count is 15,000 which is consistent with prior studies.  His electrolytes and blood counts are otherwise unremarkable.  He had a CT performed 4 days ago without contrast.  CT today was repeated, this time with contrast, but is also unremarkable.  Patient continues to complain of significant abdominal pain, the etiology of which I am uncertain.  This apparently makes it difficult for him to ambulate at home.  He was also found today to be in atrial fibrillation with rapid ventricular response which appears to be new..  Patient to be admitted to the hospitalist for further work-up and treatment of both conditions.  Final Clinical Impressions(s) / ED Diagnoses   Final diagnoses:  None    ED Discharge Orders    None       Veryl Speak, MD 01/14/18 2205
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
## 2018-01-14 NOTE — Progress Notes (Signed)
Rx Brief note: Lovenox  Patient with wt = 113 kg, CrCl~68 ml/min, BMI=41  Rx adjusted Lovenox to 55 mg daily (~0.5 mg/kg) in pt with BMI>30  Thanks Dorrene German 01/14/2018 10:44 PM

## 2018-01-14 NOTE — ED Triage Notes (Signed)
He c/o rlq area abd. Discomfort. He states he has had this for ~ 2 weeks. He cites two recent falls. He also tells me he was seen here for similar s/s a few days ago, and had diagnostic testing, including CT. He tells me the rlq pain is gradually worsening. The pain is such that, while sitting, he must change positions periodically to tolerate it.
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
## 2018-01-14 NOTE — ED Notes (Signed)
Urine culture sent down to lab with urine sample 

## 2018-01-15 ENCOUNTER — Encounter (HOSPITAL_COMMUNITY): Payer: Self-pay

## 2018-01-15 ENCOUNTER — Other Ambulatory Visit: Payer: Self-pay

## 2018-01-15 LAB — VITAMIN B12: Vitamin B-12: 862 pg/mL (ref 180–914)

## 2018-01-15 LAB — SAVE SMEAR(SSMR), FOR PROVIDER SLIDE REVIEW

## 2018-01-15 MED ORDER — MAGNESIUM CITRATE PO SOLN
1.0000 | Freq: Once | ORAL | Status: AC
  Administered 2018-01-15: 1 via ORAL
  Filled 2018-01-15: qty 296

## 2018-01-15 MED ORDER — SODIUM CHLORIDE 0.9 % IV SOLN
INTRAVENOUS | Status: AC
  Administered 2018-01-15: 12:00:00 via INTRAVENOUS

## 2018-01-15 MED ORDER — ENOXAPARIN SODIUM 60 MG/0.6ML ~~LOC~~ SOLN
55.0000 mg | Freq: Every day | SUBCUTANEOUS | Status: DC
  Administered 2018-01-15 – 2018-01-18 (×4): 55 mg via SUBCUTANEOUS
  Filled 2018-01-15 (×5): qty 0.6

## 2018-01-15 MED ORDER — DICLOFENAC SODIUM 1 % TD GEL
4.0000 g | Freq: Four times a day (QID) | TRANSDERMAL | Status: DC
  Administered 2018-01-15 – 2018-01-19 (×15): 4 g via TOPICAL
  Filled 2018-01-15: qty 100

## 2018-01-15 MED ORDER — POLYETHYLENE GLYCOL 3350 17 G PO PACK
17.0000 g | PACK | Freq: Every day | ORAL | Status: DC
  Administered 2018-01-17 – 2018-01-18 (×2): 17 g via ORAL
  Filled 2018-01-15 (×2): qty 1

## 2018-01-15 MED ORDER — GUAIFENESIN ER 600 MG PO TB12
600.0000 mg | ORAL_TABLET | Freq: Two times a day (BID) | ORAL | Status: DC
  Administered 2018-01-15 – 2018-01-19 (×9): 600 mg via ORAL
  Filled 2018-01-15 (×9): qty 1

## 2018-01-15 NOTE — Progress Notes (Signed)
PROGRESS NOTE    Larry Church  [name]:[id number] DOB: 04-06-1932 DOA: 01/14/2018 PCP: Lilian Coma., MD   Brief Narrative: Larry Church is a 84 y.o. male with medical history significant for morbid obesity, chronic back pain, OSA on cpap, CAD (recent nuclear stress and TTE without significant findings), and fall with subarachnoid hemorrhage several years ago. Patient presented secondary to abdominal and back pain in setting of chronic compression fracture and recent fall.    Assessment & Plan:   Active Problems:   Low back pain   Obstructive apnea   Morbid obesity (HCC)   Atrial fibrillation (HCC)   Abdominal pain, right lower quadrant   Mild cognitive impairment   BPH (benign prostatic hyperplasia)   Macrocytosis   Atrial fibrillation with rapid ventricular response (HCC)   Back pain In setting of compression fracture which is chronic but exacerbated secondary to recent fall. -Continue Tramadol prn -Outpatient IR follow-up  Abdominal pain In setting of recent fall. Hit his abdomen on the toilet as he fell. Possible muscle strain/contusion -Continue Tramadol -Add Voltaren gel  Atrial fibrillation with RVR New diagnosis. cHA2ds2-Vasc=4(estimated yearly stroke risk according to Lip et al. is 4,0%), HasBled=3(estimated yearly bleeding risk according to pisters et al. is 3,74%), EHRA=I. -Continue metoprolol -Hold anticoagulation in setting of fall/bleeding risk -Outpatient cardiology follow-up  Hypotension Likely in setting of metoprolol. -Start IV fluids and watch for worsening blood pressure  Mild cognitive impairment -Continue Namenda  CAD Patient with a cath in 2015 significant for moderate disease. -Continue aspirin, Lipitor  BPH -Continue finasteride and alfuzosin  Chronic cough -Continue mucinex  Constipation -Magnesium citrate   DVT prophylaxis: Lovenox Code Status:   Code Status: Full Code Family Communication: Wife, daughter at  bedside Disposition Plan: Discharge in 24-48 hours pending PT evaluation, improvement of blood pressure with IV fluids   Consultants:   None  Procedures:   None  Antimicrobials:  None    Subjective: Abdominal pain when moving.  Objective: Vitals:   01/14/18 2230 01/14/18 2326 01/15/18 0455 01/15/18 1001  BP:  (!) 124/100 (!) 97/53 112/71  Pulse:  62 86 98  Resp:      Temp:      TempSrc:   Axillary   SpO2:  94% 94%   Weight:      Height: 5\' 5"  (1.651 m)       Intake/Output Summary (Last 24 hours) at 01/15/2018 1206 Last data filed at 01/15/2018 0500 Gross per 24 hour  Intake 1422 ml  Output 150 ml  Net 1272 ml   Filed Weights   01/14/18 1411  Weight: 113.4 kg    Examination:  General exam: Appears calm and comfortable Respiratory system: Clear to auscultation. Respiratory effort normal. Cardiovascular system: S1 & S2 heard, RRR. No murmurs, rubs, gallops or clicks. Gastrointestinal system: Abdomen is slightly distended, soft and nontender. No organomegaly or masses felt. Normal bowel sounds heard. Central nervous system: Alert and oriented. No focal neurological deficits. Musculoskeletal: No edema. No calf tenderness. No midline back tenderness Skin: No cyanosis. No rashes Psychiatry: Judgement and insight appear normal. Mood & affect appropriate.     Data Reviewed: I have personally reviewed following labs and imaging studies  CBC: Recent Labs  Lab 01/10/18 1213 01/14/18 1550  WBC 14.5* 15.1*  NEUTROABS  --  12.6*  HGB 14.6 14.9  HCT 44.0 46.0  MCV 102.8* 104.5*  PLT 233 741   Basic Metabolic Panel: Recent Labs  Lab 01/10/18 1213 01/14/18 1550  NA 137 137  K 3.7 3.9  CL 99 99  CO2 25 27  GLUCOSE 133* 128*  BUN 25* 16  CREATININE 0.97 0.95  CALCIUM 8.9 9.1   GFR: Estimated Creatinine Clearance: 68.6 mL/min (by C-G formula based on SCr of 0.95 mg/dL). Liver Function Tests: Recent Labs  Lab 01/10/18 1213 01/14/18 1550  AST 31 30   ALT 37 34  ALKPHOS 73 77  BILITOT 1.4* 0.9  PROT 7.0 6.6  ALBUMIN 3.6 3.3*   Recent Labs  Lab 01/10/18 1213 01/14/18 1550  LIPASE 18 19   No results for input(s): AMMONIA in the last 168 hours. Coagulation Profile: No results for input(s): INR, PROTIME in the last 168 hours. Cardiac Enzymes: No results for input(s): CKTOTAL, CKMB, CKMBINDEX, TROPONINI in the last 168 hours. BNP (last 3 results) No results for input(s): PROBNP in the last 8760 hours. HbA1C: No results for input(s): HGBA1C in the last 72 hours. CBG: No results for input(s): GLUCAP in the last 168 hours. Lipid Profile: No results for input(s): CHOL, HDL, LDLCALC, TRIG, CHOLHDL, LDLDIRECT in the last 72 hours. Thyroid Function Tests: No results for input(s): TSH, T4TOTAL, FREET4, T3FREE, THYROIDAB in the last 72 hours. Anemia Panel: Recent Labs    01/14/18 2341  VITAMINB12 862   Sepsis Labs: No results for input(s): PROCALCITON, LATICACIDVEN in the last 168 hours.  Recent Results (from the past 240 hour(s))  Urine culture     Status: None   Collection Time: 01/08/18  9:17 AM  Result Value Ref Range Status   Specimen Description   Final    URINE, RANDOM Performed at [facility], [place] [place], [place] [address]    Special Requests   Final    NONE Performed at [facility], [place] [place], [place] [address]    Culture   Final    NO GROWTH Performed at [facility], [place] [address] [place], [place] [address]    Report Status 01/09/2018 FINAL  Final         Radiology Studies: Ct Abdomen Pelvis W Contrast  Result Date: 01/14/2018 CLINICAL DATA:  Abdominal discomfort right lower quadrant 2 weeks. Recent falls. EXAM: CT ABDOMEN AND PELVIS WITH CONTRAST TECHNIQUE: Multidetector CT imaging of the abdomen and pelvis was performed using the standard protocol following bolus administration of intravenous contrast. CONTRAST:  128mL ISOVUE-300 IOPAMIDOL  (ISOVUE-300) INJECTION 61% COMPARISON:  01/10/2018 FINDINGS: Lower chest: Exam demonstrates bibasilar interstitial changes and mild bronchiectasis which is stable. No focal airspace consolidation or effusion. Mild cardiomegaly. Calcification of the mitral valve annulus. Calcified plaque over the right coronary artery. Hepatobiliary: Previous cholecystectomy. Liver and biliary tree are normal. Pancreas: Mild fatty atrophy of the pancreas which is otherwise within normal. Spleen: Normal. Adrenals/Urinary Tract: Adrenal glands are normal. Mild atrophy of the right kidney unchanged. Left kidney normal in size. No hydronephrosis or nephrolithiasis. Visualized ureters and bladder are normal. The distal ureters are obscured by streak artifact from bilateral hip prostheses. Stomach/Bowel: Stomach is normal. Small bowel is unremarkable. Previous appendectomy. Minimal diverticulosis of the colon. Vascular/Lymphatic: Moderate calcified plaque over the abdominal aorta. No adenopathy. Reproductive: Unremarkable. Other: No free fluid or focal inflammatory change. Musculoskeletal: Multiple compression fractures over the thoracolumbar spine unchanged. Bilateral total hip arthroplasties unchanged. IMPRESSION: No acute findings in the abdomen/pelvis. Stable bibasilar interstitial changes with bronchiectasis. Mild colonic diverticulosis. Aortic Atherosclerosis (ICD10-I70.0). Cardiomegaly and atherosclerotic coronary artery disease. Multiple stable compression fractures over  the thoracolumbar spine. Electronically Signed   By: Marin Olp M.D.   On: 01/14/2018 18:45    Scheduled Meds: . alfuzosin  10 mg Oral Daily  . aspirin EC  81 mg Oral Daily  . atorvastatin  40 mg Oral QHS  . enoxaparin (LOVENOX) injection  55 mg Subcutaneous Daily  . finasteride  5 mg Oral Daily  . guaiFENesin  600 mg Oral BID  . loratadine  10 mg Oral Daily  . memantine  10 mg Oral BID  . metoprolol tartrate  25 mg Oral BID  . pantoprazole  40 mg  Oral Daily  . senna  1 tablet Oral Daily  . sodium chloride flush  3 mL Intravenous Q12H  . venlafaxine XR  150 mg Oral Q breakfast   Continuous Infusions: . sodium chloride       LOS: 1 day     Cordelia Poche, MD [facility] 01/15/2018, 12:06 PM  If 7PM-7AM, please contact night-coverage [email or web address]

## 2018-01-16 ENCOUNTER — Encounter (HOSPITAL_COMMUNITY): Payer: Self-pay

## 2018-01-16 ENCOUNTER — Inpatient Hospital Stay (HOSPITAL_COMMUNITY): Payer: Medicare Other

## 2018-01-16 ENCOUNTER — Ambulatory Visit (HOSPITAL_COMMUNITY): Payer: Medicare Other | Admitting: Nurse Practitioner

## 2018-01-16 LAB — CBC
HCT: 41.5 % (ref 39.0–52.0)
Hemoglobin: 13.2 g/dL (ref 13.0–17.0)
MCH: 33.4 pg (ref 26.0–34.0)
MCHC: 31.8 g/dL (ref 30.0–36.0)
MCV: 105.1 fL — ABNORMAL HIGH (ref 80.0–100.0)
Platelets: 225 10*3/uL (ref 150–400)
RBC: 3.95 MIL/uL — ABNORMAL LOW (ref 4.22–5.81)
RDW: 15.8 % — ABNORMAL HIGH (ref 11.5–15.5)
WBC: 12.8 10*3/uL — ABNORMAL HIGH (ref 4.0–10.5)
nRBC: 0 % (ref 0.0–0.2)

## 2018-01-16 LAB — FOLATE RBC
Folate, Hemolysate: >620 ng/mL
Folate, RBC: >1542 ng/mL (ref >498)
Hematocrit: 40.2 % (ref 37.5–51.0)

## 2018-01-16 MED ORDER — HALOPERIDOL LACTATE 5 MG/ML IJ SOLN
1.0000 mg | Freq: Once | INTRAMUSCULAR | Status: AC
  Administered 2018-01-16: 1 mg via INTRAVENOUS

## 2018-01-16 MED ORDER — HALOPERIDOL LACTATE 5 MG/ML IJ SOLN
INTRAMUSCULAR | Status: AC
  Filled 2018-01-16: qty 1

## 2018-01-16 NOTE — Progress Notes (Signed)
Pt states he will contact RN to have her contact RT when he is ready for CPAP.

## 2018-01-16 NOTE — Progress Notes (Signed)
Pt. Placed back on CPAP and mask adjusted.  No complaints offered.

## 2018-01-16 NOTE — Progress Notes (Signed)
On call provider paged for pt agitation. Awaiting call back/order placement. Wife at bedside. Pt repositioned to bed. Pt removing ekg monitor and gown, pt states "I'm upset" not oriented to place. Hospital delirium suspected. Will continue to monitor closely.

## 2018-01-16 NOTE — Evaluation (Signed)
Occupational Therapy Evaluation Patient Details Name: Larry Church MRN: [id number] DOB: 1932/04/05 Today's Date: 01/16/2018    History of Present Illness 85 yo male admitted with low back pain. Hx of morbid obesity, CAD, chronic back pain, SAH, falls, A fib, compression fxs T-L, hip replacements   Clinical Impression   Pt admitted with back pain. Pt currently with functional limitations due to the deficits listed below (see OT Problem List).  Pt will benefit from skilled OT to increase their safety and independence with ADL and functional mobility for ADL to facilitate discharge to venue listed below.      Follow Up Recommendations  No OT follow up    Equipment Recommendations  3 in 1 bedside commode    Recommendations for Other Services       Precautions / Restrictions Precautions Precautions: Fall Restrictions Weight Bearing Restrictions: No      Mobility Bed Mobility Overal bed mobility: Needs Assistance Bed Mobility: Supine to Sit;Sit to Supine     Supine to sit: Min assist;HOB elevated Sit to supine: HOB elevated;Min guard   General bed mobility comments: Assist for trunk  Transfers Overall transfer level: Needs assistance Equipment used: Rolling walker (2 wheeled) Transfers: Sit to/from Stand Sit to Stand: Min guard         General transfer comment: Min guard assist with elevated surface or bil armrests. VCs safety.    Balance Overall balance assessment: Needs assistance;History of Falls           Standing balance-[name]: Poor                             ADL either performed or assessed with clinical judgement   ADL Overall ADL's : Needs assistance/impaired Eating/Feeding: Set up;Sitting   Grooming: Min guard;Standing   Upper Body Bathing: Set up;Sitting   Lower Body Bathing: Moderate assistance;Sit to/from stand;Cueing for sequencing;Cueing for safety   Upper Body Dressing : Set up;Sitting   Lower Body Dressing:  Moderate assistance;Sit to/from stand;Cueing for sequencing;Cueing for safety   Toilet Transfer: Ambulation;Comfort height toilet;Minimal assistance   Toileting- Clothing Manipulation and Hygiene: Moderate assistance;Sit to/from stand;Cueing for sequencing;Cueing for safety         General ADL Comments: education provided regarding ways to protect back during ADL activity      Vision Patient Visual Report: No change from baseline       Perception     Praxis      Pertinent Vitals/Pain Pain Assessment: 0-10 Pain Score: 3  Pain Location: back Pain Descriptors / Indicators: Sore;Discomfort Pain Intervention(s): Limited activity within patient's tolerance;Repositioned;Monitored during session     Hand Dominance     Extremity/Trunk Assessment Upper Extremity Assessment Upper Extremity Assessment: Overall WFL for tasks assessed   Lower Extremity Assessment Lower Extremity Assessment: Generalized weakness   Cervical / Trunk Assessment Cervical / Trunk Assessment: Normal   Communication Communication Communication: No difficulties   Cognition Arousal/Alertness: Awake/alert Behavior During Therapy: WFL for tasks assessed/performed Overall Cognitive Status: Within Functional Limits for tasks assessed                                     General Comments       Exercises     Shoulder Instructions      Home Living Family/patient expects to be discharged to:: Private residence Living Arrangements: Spouse/significant other Available  Help at Discharge: Family Type of Home: House Home Access: Stairs to enter;Ramped entrance Entrance Stairs-Number of Steps: 3   Home Layout: One level               Home Equipment: [place] - 2 wheels;Cane - single point          Prior Functioning/Environment Level of Independence: Independent with assistive device(s)        Comments: uses RW for ambulation        OT Problem List: Decreased  strength;Decreased activity tolerance;Impaired balance (sitting and/or standing);Decreased knowledge of precautions;Pain      OT Treatment/Interventions: Self-care/ADL training;Patient/family education;DME and/or AE instruction;Therapeutic activities    OT Goals(Current goals can be found in the care plan section) Acute Rehab OT Goals Patient Stated Goal: less pain. to be able to move around better OT Goal Formulation: With patient Time For Goal Achievement: 01/30/18 ADL Goals Pt Will Perform Lower Body Dressing: sit to/from stand;with supervision Pt Will Transfer to Toilet: with supervision;ambulating Pt Will Perform Toileting - Clothing Manipulation and hygiene: with supervision;sit to/from stand Pt Will Perform Tub/Shower Transfer: with supervision;ambulating;Shower transfer  OT Frequency: Min 2X/week   Barriers to D/C:            Co-evaluation              AM-PAC OT "6 Clicks" Daily Activity     Outcome Measure Help from another person eating meals?: None Help from another person taking care of personal grooming?: A Little Help from another person toileting, which includes using toliet, bedpan, or urinal?: A Lot Help from another person bathing (including washing, rinsing, drying)?: A Little Help from another person to put on and taking off regular upper body clothing?: A Little Help from another person to put on and taking off regular lower body clothing?: A Lot 6 Click Score: 17   End of Session Nurse Communication: Mobility status  Activity Tolerance: Patient tolerated treatment well Patient left: in bed  OT Visit Diagnosis: Unsteadiness on feet (R26.81);History of falling (Z91.81);Pain Pain - part of body: (back pain)                Time: 1400-1425 OT Time Calculation (min): 25 min Charges:  OT General Charges $OT Visit: 1 Visit OT Evaluation $OT Eval Low Complexity: 1 Low OT Treatments $Self Care/Home Management : 8-22 mins  Kari Baars, OT Acute  Rehabilitation Services [phone] Office- [phone], Edwena Felty D 01/16/2018, 2:44 PM

## 2018-01-16 NOTE — Progress Notes (Signed)
PROGRESS NOTE    Larry Church  LGX:[id number] DOB: [birth year] DOA: 01/14/2018 PCP: Lilian Coma., MD   Brief Narrative: Larry Church is a 84 y.o. male with medical history significant for morbid obesity, chronic back pain, OSA on cpap, CAD (recent nuclear stress and TTE without significant findings), and fall with subarachnoid hemorrhage several years ago. Patient presented secondary to abdominal and back pain in setting of chronic compression fracture and recent fall.    Assessment & Plan:   Active Problems:   Low back pain   Obstructive apnea   Morbid obesity (HCC)   Atrial fibrillation (HCC)   Abdominal pain, right lower quadrant   Mild cognitive impairment   BPH (benign prostatic hyperplasia)   Macrocytosis   Atrial fibrillation with rapid ventricular response (HCC)   Back pain In setting of compression fracture which is chronic but exacerbated secondary to recent fall. -Continue Tramadol prn -Outpatient IR follow-up -PT/OT recommendations   Abdominal pain In setting of recent fall. Hit his abdomen on the toilet as he fell. Possible muscle strain/contusion. Improved with bowel movement -Continue Tramadol -Continue Voltaren gel  Atrial fibrillation with RVR New diagnosis. cHA2ds2-Vasc=4(estimated yearly stroke risk according to Lip et al. is 4,0%), HasBled=3(estimated yearly bleeding risk according to pisters et al. is 3,74%), EHRA=I. -Continue metoprolol -Hold anticoagulation in setting of fall/bleeding risk -Outpatient cardiology follow-up  Hypotension Likely in setting of metoprolol. Improved.  Mild cognitive impairment -Continue Namenda  CAD Patient with a cath in 2015 significant for moderate disease. -Continue aspirin, Lipitor  BPH -Continue finasteride and alfuzosin  Chronic cough -Continue mucinex  Constipation -Magnesium citrate   DVT prophylaxis: Lovenox Code Status:   Code Status: Full Code Family Communication: Wife at  bedside Disposition Plan: Discharge in 24 hours pending PT evaluation/recommendations   Consultants:   None  Procedures:   None  Antimicrobials:  None    Subjective: Abdominal pain improved. Back pain present.  Objective: Vitals:   01/15/18 1439 01/15/18 2056 01/16/18 0706 01/16/18 1347  BP: 101/72 (!) 140/91 (!) 115/95 101/66  Pulse: 78 80 81 67  Resp:  18 20 16   Temp:  97.8 F (36.6 C) 98.3 F (36.8 C) 98.1 F (36.7 C)  TempSrc:  Oral Oral Oral  SpO2: 92% 95% 92% 92%  Weight:      Height:        Intake/Output Summary (Last 24 hours) at 01/16/2018 1731 Last data filed at 01/16/2018 1000 Gross per 24 hour  Intake 405.05 ml  Output 100 ml  Net 305.05 ml   Filed Weights   01/14/18 1411  Weight: 113.4 kg    Examination:  General exam: Appears calm and comfortable Respiratory system: Clear to auscultation. Respiratory effort normal. Cardiovascular system: S1 & S2 heard, RRR. No murmurs, rubs, gallops or clicks. Gastrointestinal system: Abdomen is nondistended, soft and nontender. No organomegaly or masses felt. Normal bowel sounds heard. Central nervous system: Alert and oriented. No focal neurological deficits. Extremities: No edema. No calf tenderness Skin: No cyanosis. No rashes Psychiatry: Judgement and insight appear normal. Mood & affect appropriate.    Data Reviewed: I have personally reviewed following labs and imaging studies  CBC: Recent Labs  Lab 01/10/18 1213 01/14/18 1550 01/14/18 2341 01/16/18 1107  WBC 14.5* 15.1*  --  12.8*  NEUTROABS  --  12.6*  --   --   HGB 14.6 14.9  --  13.2  HCT 44.0 46.0 40.2 41.5  MCV 102.8* 104.5*  --  105.1*  PLT 233 242  --  160   Basic Metabolic Panel: Recent Labs  Lab 01/10/18 1213 01/14/18 1550  NA 137 137  K 3.7 3.9  CL 99 99  CO2 25 27  GLUCOSE 133* 128*  BUN 25* 16  CREATININE 0.97 0.95  CALCIUM 8.9 9.1   GFR: Estimated Creatinine Clearance: 68.6 mL/min (by C-G formula based on SCr  of 0.95 mg/dL). Liver Function Tests: Recent Labs  Lab 01/10/18 1213 01/14/18 1550  AST 31 30  ALT 37 34  ALKPHOS 73 77  BILITOT 1.4* 0.9  PROT 7.0 6.6  ALBUMIN 3.6 3.3*   Recent Labs  Lab 01/10/18 1213 01/14/18 1550  LIPASE 18 19   No results for input(s): AMMONIA in the last 168 hours. Coagulation Profile: No results for input(s): INR, PROTIME in the last 168 hours. Cardiac Enzymes: No results for input(s): CKTOTAL, CKMB, CKMBINDEX, TROPONINI in the last 168 hours. BNP (last 3 results) No results for input(s): PROBNP in the last 8760 hours. HbA1C: No results for input(s): HGBA1C in the last 72 hours. CBG: No results for input(s): GLUCAP in the last 168 hours. Lipid Profile: No results for input(s): CHOL, HDL, LDLCALC, TRIG, CHOLHDL, LDLDIRECT in the last 72 hours. Thyroid Function Tests: No results for input(s): TSH, T4TOTAL, FREET4, T3FREE, THYROIDAB in the last 72 hours. Anemia Panel: Recent Labs    01/14/18 2341  VITAMINB12 862   Sepsis Labs: No results for input(s): PROCALCITON, LATICACIDVEN in the last 168 hours.  Recent Results (from the past 240 hour(s))  Urine culture     Status: None   Collection Time: 01/08/18  9:17 AM  Result Value Ref Range Status   Specimen Description   Final    URINE, RANDOM Performed at [facility], [address] [place], [place] [address]    Special Requests   Final    NONE Performed at [facility], [address] [place], [place] [address]    Culture   Final    NO GROWTH Performed at [facility], [place] [address] [place], [place] [address]    Report Status 01/09/2018 FINAL  Final         Radiology Studies: Dg Chest 2 View  Result Date: 01/16/2018 CLINICAL DATA:  Chronic cough.  High blood pressure EXAM: CHEST - 2 VIEW COMPARISON:  01/08/2018 FINDINGS: Cardiac silhouette is borderline enlarged. No mediastinal or hilar masses. No evidence of adenopathy. There are irregularly  thickened interstitial markings bilaterally, which predominate peripherally. This is stable. No lung consolidation to suggest pneumonia. No evidence of pulmonary edema. No pleural effusion or pneumothorax. Skeletal structures are demineralized. Several mild compression deformities of the thoracic and upper lumbar spine. IMPRESSION: 1. No acute cardiopulmonary disease. 2. Chronic interstitial lung disease similar to the prior exam. Borderline cardiomegaly. Electronically Signed   By: Lajean Manes M.D.   On: 01/16/2018 16:52   Ct Abdomen Pelvis W Contrast  Result Date: 01/14/2018 CLINICAL DATA:  Abdominal discomfort right lower quadrant 2 weeks. Recent falls. EXAM: CT ABDOMEN AND PELVIS WITH CONTRAST TECHNIQUE: Multidetector CT imaging of the abdomen and pelvis was performed using the standard protocol following bolus administration of intravenous contrast. CONTRAST:  131mL ISOVUE-300 IOPAMIDOL (ISOVUE-300) INJECTION 61% COMPARISON:  01/10/2018 FINDINGS: Lower chest: Exam demonstrates bibasilar interstitial changes and mild bronchiectasis which is stable. No focal airspace consolidation or effusion. Mild cardiomegaly. Calcification of the mitral valve annulus. Calcified plaque over the right coronary artery. Hepatobiliary: Previous cholecystectomy.  Liver and biliary tree are normal. Pancreas: Mild fatty atrophy of the pancreas which is otherwise within normal. Spleen: Normal. Adrenals/Urinary Tract: Adrenal glands are normal. Mild atrophy of the right kidney unchanged. Left kidney normal in size. No hydronephrosis or nephrolithiasis. Visualized ureters and bladder are normal. The distal ureters are obscured by streak artifact from bilateral hip prostheses. Stomach/Bowel: Stomach is normal. Small bowel is unremarkable. Previous appendectomy. Minimal diverticulosis of the colon. Vascular/Lymphatic: Moderate calcified plaque over the abdominal aorta. No adenopathy. Reproductive: Unremarkable. Other: No free fluid  or focal inflammatory change. Musculoskeletal: Multiple compression fractures over the thoracolumbar spine unchanged. Bilateral total hip arthroplasties unchanged. IMPRESSION: No acute findings in the abdomen/pelvis. Stable bibasilar interstitial changes with bronchiectasis. Mild colonic diverticulosis. Aortic Atherosclerosis (ICD10-I70.0). Cardiomegaly and atherosclerotic coronary artery disease. Multiple stable compression fractures over the thoracolumbar spine. Electronically Signed   By: Marin Olp M.D.   On: 01/14/2018 18:45    Scheduled Meds: . alfuzosin  10 mg Oral Daily  . aspirin EC  81 mg Oral Daily  . atorvastatin  40 mg Oral QHS  . diclofenac sodium  4 g Topical QID  . enoxaparin (LOVENOX) injection  55 mg Subcutaneous Daily  . finasteride  5 mg Oral Daily  . guaiFENesin  600 mg Oral BID  . loratadine  10 mg Oral Daily  . memantine  10 mg Oral BID  . metoprolol tartrate  25 mg Oral BID  . pantoprazole  40 mg Oral Daily  . polyethylene glycol  17 g Oral Daily  . senna  1 tablet Oral Daily  . sodium chloride flush  3 mL Intravenous Q12H  . venlafaxine XR  150 mg Oral Q breakfast   Continuous Infusions: . sodium chloride       LOS: 2 days     Cordelia Poche, MD [facility] 01/16/2018, 5:31 PM  If 7PM-7AM, please contact night-coverage [email or web address]

## 2018-01-16 NOTE — Evaluation (Signed)
Physical Therapy Evaluation Patient Details Name: Larry Church MRN: [id number] DOB: 11-20-1932 Today's Date: 01/16/2018   History of Present Illness  84 yo male admitted with low back pain. Hx of morbid obesity, CAD, chronic back pain, SAH, falls, A fib, compression fxs T-L, hip replacements  Clinical Impression  On eval, pt required Min assist for mobility. He walked ~25 feet with use of a RW. He was able to tolerate sitting for ~30 minutes before requesting to return to bed. Pain was rated 3/10 at rest, 6/10 with activity/sitting. Discussed d/c plan-pt is agreeable to HHPT f/u provided his pain remains controlled/tolerable. Encouraged pt/wife to have follow up appt with MD to determine if anything can be done for his compression fractures (Interventional Radiology vs orthopedics/neurosurgery). Will follow and progress activity as tolerated.     Follow Up Recommendations Home health PT;Supervision/Assistance - 24 hour    Equipment Recommendations  3in1 (PT)    Recommendations for Other Services       Precautions / Restrictions Precautions Precautions: Fall Restrictions Weight Bearing Restrictions: No      Mobility  Bed Mobility Overal bed mobility: Needs Assistance Bed Mobility: Supine to Sit;Sit to Supine     Supine to sit: Min assist;HOB elevated Sit to supine: HOB elevated;Min guard   General bed mobility comments: Assist for trunk  Transfers Overall transfer level: Needs assistance Equipment used: Rolling walker (2 wheeled) Transfers: Sit to/from Stand Sit to Stand: Min guard         General transfer comment: Min guard assist with elevated surface or bil armrests. VCs safety.  Ambulation/Gait Ambulation/Gait assistance: Min assist Gait Distance (Feet): 25 Feet Assistive device: Rolling walker (2 wheeled) Gait Pattern/deviations: Step-through pattern;Decreased stride length     General Gait Details: pt able to tolerate a short distance before pain began  to increase and he needed to return to the room.   Stairs            Wheelchair Mobility    Modified Rankin (Stroke Patients Only)       Balance Overall balance assessment: Needs assistance;History of Falls           Standing balance-[name]: Poor                               Pertinent Vitals/Pain Pain Assessment: 0-10 Pain Score: 6  Pain Location: R lower back with radiation into buttocks Pain Descriptors / Indicators: Sore;Aching Pain Intervention(s): Monitored during session;Repositioned;Heat applied    Home Living Family/patient expects to be discharged to:: Private residence Living Arrangements: Spouse/significant other Available Help at Discharge: Family Type of Home: House Home Access: Stairs to enter;Ramped entrance   Entrance Stairs-Number of Steps: [place]: One level Home Equipment: [place] - 2 wheels;Cane - single point      Prior Function Level of Independence: Independent with assistive device(s)         Comments: uses RW for ambulation     Hand Dominance        Extremity/Trunk Assessment   Upper Extremity Assessment Upper Extremity Assessment: Overall WFL for tasks assessed    Lower Extremity Assessment Lower Extremity Assessment: Generalized weakness    Cervical / Trunk Assessment Cervical / Trunk Assessment: Normal  Communication   Communication: No difficulties  Cognition Arousal/Alertness: Awake/alert Behavior During Therapy: WFL for tasks assessed/performed Overall Cognitive Status: Within Functional Limits for tasks assessed  General Comments      Exercises     Assessment/Plan    PT Assessment Patient needs continued PT services  PT Problem List Decreased strength;Decreased balance;Decreased mobility;Decreased activity tolerance;Pain       PT Treatment Interventions DME instruction;Gait training;Functional mobility  training;Therapeutic activities;Balance training;Patient/family education;Therapeutic exercise    PT Goals (Current goals can be found in the Care Plan section)  Acute Rehab PT Goals Patient Stated Goal: less pain. to be able to move around better PT Goal Formulation: With patient/family Time For Goal Achievement: 01/30/18 Potential to Achieve Goals: Good    Frequency Min 3X/week   Barriers to discharge        Co-evaluation               AM-PAC PT "6 Clicks" Mobility  Outcome Measure Help needed turning from your back to your side while in a flat bed without using bedrails?: A Little Help needed moving from lying on your back to sitting on the side of a flat bed without using bedrails?: A Little Help needed moving to and from a bed to a chair (including a wheelchair)?: A Little Help needed standing up from a chair using your arms (e.g., wheelchair or bedside chair)?: A Little Help needed to walk in hospital room?: A Little Help needed climbing 3-5 steps with a railing? : A Lot 6 Click Score: 17    End of Session Equipment Utilized During Treatment: Gait belt Activity Tolerance: Patient limited by pain;Patient limited by fatigue Patient left: in bed;with call bell/phone within reach   PT Visit Diagnosis: Muscle weakness (generalized) (M62.81);Difficulty in walking, not elsewhere classified (R26.2);Pain;Unsteadiness on feet (R26.81);History of falling (Z91.81) Pain - part of body: (back)    Time: 2694-8546 PT Time Calculation (min) (ACUTE ONLY): 54 min   Charges:   PT Evaluation $PT Eval Moderate Complexity: 1 Mod PT Treatments $Gait Training: 8-22 mins $Therapeutic Activity: 8-22 mins          Weston Anna, PT Acute Rehabilitation Services Pager: [phone] Office: [phone]

## 2018-01-17 ENCOUNTER — Inpatient Hospital Stay (HOSPITAL_COMMUNITY): Payer: Medicare Other

## 2018-01-17 ENCOUNTER — Encounter (HOSPITAL_COMMUNITY): Payer: Self-pay

## 2018-01-17 LAB — MAGNESIUM: Magnesium: 2.2 mg/dL (ref 1.7–2.4)

## 2018-01-17 LAB — PATHOLOGIST SMEAR REVIEW

## 2018-01-17 MED ORDER — LORAZEPAM 2 MG/ML IJ SOLN
1.0000 mg | Freq: Once | INTRAMUSCULAR | Status: AC | PRN
  Administered 2018-01-17: 1 mg via INTRAVENOUS
  Filled 2018-01-17: qty 1

## 2018-01-17 MED ORDER — VENLAFAXINE HCL ER 75 MG PO CP24
75.0000 mg | ORAL_CAPSULE | Freq: Every day | ORAL | Status: DC
  Administered 2018-01-17 – 2018-01-18 (×2): 75 mg via ORAL
  Filled 2018-01-17 (×2): qty 1

## 2018-01-17 MED ORDER — ONDANSETRON HCL 4 MG/2ML IJ SOLN
4.0000 mg | Freq: Three times a day (TID) | INTRAMUSCULAR | Status: DC | PRN
  Administered 2018-01-19: 4 mg via INTRAVENOUS
  Filled 2018-01-17: qty 2

## 2018-01-17 NOTE — Care Management Important Message (Signed)
Important Message  Patient Details  Name: Larry Church MRN: [id number] Date of Birth: [birth year]   Medicare Important Message Given:  Yes    Kerin Salen 01/17/2018, 11:56 AMImportant Message  Patient Details  Name: Larry Church MRN: [id number] Date of Birth: 04-28-1932   Medicare Important Message Given:  Yes    Kerin Salen 01/17/2018, 11:56 AM

## 2018-01-17 NOTE — Progress Notes (Signed)
PROGRESS NOTE    JAICOB DIA  IOX:[id number] DOB: [birth year] DOA: 01/14/2018 PCP: Lilian Coma., MD   Brief Narrative: Larry Church is a 84 y.o. male with medical history significant for morbid obesity, chronic back pain, OSA on cpap, CAD (recent nuclear stress and TTE without significant findings), and fall with subarachnoid hemorrhage several years ago. Patient presented secondary to abdominal and back pain in setting of chronic compression fracture and recent fall.    Assessment & Plan:   Active Problems:   Low back pain   Obstructive apnea   Morbid obesity (HCC)   Atrial fibrillation (HCC)   Abdominal pain, right lower quadrant   Mild cognitive impairment   BPH (benign prostatic hyperplasia)   Macrocytosis   Atrial fibrillation with rapid ventricular response (HCC)   Back pain In setting of compression fracture which is chronic but exacerbated secondary to recent fall. Worsening pain today. X-ray did not show acute fracture -MRI to rule out acute fracture -Continue Tramadol prn -Outpatient IR follow-up -PT/OT recommendations: home health PT  Abdominal pain In setting of recent fall. Hit his abdomen on the toilet as he fell. Possible muscle strain/contusion. Improved with bowel movement -Continue Tramadol -Continue Voltaren gel  Atrial fibrillation with RVR New diagnosis. cHA2ds2-Vasc=4(estimated yearly stroke risk according to Lip et al. is 4,0%), HasBled=3(estimated yearly bleeding risk according to pisters et al. is 3,74%), EHRA=I. -Continue metoprolol -Hold anticoagulation in setting of fall/bleeding risk -Outpatient cardiology follow-up  Hypotension Likely in setting of metoprolol. Improved.  Mild cognitive impairment Episode of delirium overnight and responded to haldol. Likely related to hospital delirium. Would prefer not to start antipsychotic as this will likely improve after discharge. -Continue Namenda  CAD Patient with a cath in 2015  significant for moderate disease. -Continue aspirin, Lipitor  BPH -Continue finasteride and alfuzosin  Chronic cough -Continue mucinex  Constipation -Magnesium citrate   DVT prophylaxis: Lovenox Code Status:   Code Status: Full Code Family Communication: Wife at bedside Disposition Plan: Discharge home pending results of MRI   Consultants:   None  Procedures:   None  Antimicrobials:  None    Subjective: Back pain worsened this morning.  Objective: Vitals:   01/16/18 2127 01/17/18 0603 01/17/18 0734 01/17/18 1326  BP: 108/71 117/74 116/83 136/83  Pulse: 79 86 88 (!) 41  Resp: 20 20 18 20   Temp: 98.2 F (36.8 C) 97.7 F (36.5 C) 97.9 F (36.6 C) 98.3 F (36.8 C)  TempSrc: Oral Oral Oral Oral  SpO2: 93% 97% 97% 95%  Weight:      Height:        Intake/Output Summary (Last 24 hours) at 01/17/2018 1353 Last data filed at 01/16/2018 2200 Gross per 24 hour  Intake 720 ml  Output 151 ml  Net 569 ml   Filed Weights   01/14/18 1411  Weight: 113.4 kg    Examination:  General exam: Appears calm and comfortable Respiratory system: Clear to auscultation. Respiratory effort normal. Cardiovascular system: S1 & S2 heard, RRR. Gastrointestinal system: Abdomen is nondistended, soft and nontender. No organomegaly or masses felt. Normal bowel sounds heard. Central nervous system: Alert and oriented. No focal neurological deficits. Musculoskeletal: No back point tenderness. No paraspinal tenderness Skin: No cyanosis. No rashes Psychiatry: Judgement and insight appear normal. Mood & affect appropriate.    Data Reviewed: I have personally reviewed following labs and imaging studies  CBC: Recent Labs  Lab 01/14/18 1550 01/14/18 2341 01/16/18 1107  WBC 15.1*  --  12.8*  NEUTROABS 12.6*  --   --   HGB 14.9  --  13.2  HCT 46.0 40.2 41.5  MCV 104.5*  --  105.1*  PLT 242  --  161   Basic Metabolic Panel: Recent Labs  Lab 01/14/18 1550  NA 137  K 3.9  CL  99  CO2 27  GLUCOSE 128*  BUN 16  CREATININE 0.95  CALCIUM 9.1   GFR: Estimated Creatinine Clearance: 68.6 mL/min (by C-G formula based on SCr of 0.95 mg/dL). Liver Function Tests: Recent Labs  Lab 01/14/18 1550  AST 30  ALT 34  ALKPHOS 77  BILITOT 0.9  PROT 6.6  ALBUMIN 3.3*   Recent Labs  Lab 01/14/18 1550  LIPASE 19   No results for input(s): AMMONIA in the last 168 hours. Coagulation Profile: No results for input(s): INR, PROTIME in the last 168 hours. Cardiac Enzymes: No results for input(s): CKTOTAL, CKMB, CKMBINDEX, TROPONINI in the last 168 hours. BNP (last 3 results) No results for input(s): PROBNP in the last 8760 hours. HbA1C: No results for input(s): HGBA1C in the last 72 hours. CBG: No results for input(s): GLUCAP in the last 168 hours. Lipid Profile: No results for input(s): CHOL, HDL, LDLCALC, TRIG, CHOLHDL, LDLDIRECT in the last 72 hours. Thyroid Function Tests: No results for input(s): TSH, T4TOTAL, FREET4, T3FREE, THYROIDAB in the last 72 hours. Anemia Panel: Recent Labs    01/14/18 2341  VITAMINB12 862   Sepsis Labs: No results for input(s): PROCALCITON, LATICACIDVEN in the last 168 hours.  Recent Results (from the past 240 hour(s))  Urine culture     Status: None   Collection Time: 01/08/18  9:17 AM  Result Value Ref Range Status   Specimen Description   Final    URINE, RANDOM Performed at [facility], [address] [place], [place] [address]    Special Requests   Final    NONE Performed at [facility], [address] [place], [place] [address]    Culture   Final    NO GROWTH Performed at [facility], [place] [address] [place], [place] [address]    Report Status 01/09/2018 FINAL  Final         Radiology Studies: Dg Chest 2 View  Result Date: 01/16/2018 CLINICAL DATA:  Chronic cough.  High blood pressure EXAM: CHEST - 2 VIEW COMPARISON:  01/08/2018 FINDINGS: Cardiac silhouette is borderline  enlarged. No mediastinal or hilar masses. No evidence of adenopathy. There are irregularly thickened interstitial markings bilaterally, which predominate peripherally. This is stable. No lung consolidation to suggest pneumonia. No evidence of pulmonary edema. No pleural effusion or pneumothorax. Skeletal structures are demineralized. Several mild compression deformities of the thoracic and upper lumbar spine. IMPRESSION: 1. No acute cardiopulmonary disease. 2. Chronic interstitial lung disease similar to the prior exam. Borderline cardiomegaly. Electronically Signed   By: Lajean Manes M.D.   On: 01/16/2018 16:52    Scheduled Meds: . alfuzosin  10 mg Oral Daily  . aspirin EC  81 mg Oral Daily  . atorvastatin  40 mg Oral QHS  . diclofenac sodium  4 g Topical QID  . enoxaparin (LOVENOX) injection  55 mg Subcutaneous Daily  . finasteride  5 mg Oral Daily  . guaiFENesin  600 mg Oral BID  . loratadine  10 mg Oral Daily  . memantine  10 mg Oral BID  . metoprolol tartrate  25 mg Oral BID  . pantoprazole  40 mg Oral Daily  . polyethylene glycol  17 g Oral Daily  . senna  1 tablet Oral Daily  . sodium chloride flush  3 mL Intravenous Q12H  . venlafaxine XR  150 mg Oral Q breakfast  . venlafaxine XR  75 mg Oral QHS   Continuous Infusions: . sodium chloride       LOS: 3 days     Cordelia Poche, MD [facility] 01/17/2018, 1:53 PM  If 7PM-7AM, please contact night-coverage [email or web address]

## 2018-01-17 NOTE — Progress Notes (Signed)
Occupational Therapy Treatment Patient Details Name: Larry Church MRN: [id number] DOB: [birth year] Today's Date: Church    History of present illness 84 yo male admitted with low back pain. Hx of morbid obesity, CAD, chronic back pain, SAH, falls, A fib, compression fxs T-L, hip replacements      Follow Up Recommendations  Home health OT    Equipment Recommendations  3 in 1 bedside commode           Mobility Bed Mobility Overal bed mobility: Needs Assistance Bed Mobility: Supine to Sit;Rolling Rolling: Supervision   Supine to sit: Min assist     General bed mobility comments: VC for techniquie  Transfers Overall transfer level: Needs assistance Equipment used: Rolling walker (2 wheeled) Transfers: Sit to/from [facility] Sit to Stand: Min guard Stand pivot transfers: Min guard       General transfer comment: Min guard assist with elevated surface or bil armrests. VCs safety.    Balance Overall balance assessment: Needs assistance;History of Falls           Standing balance-[name]: Poor                             ADL either performed or assessed with clinical judgement   ADL Overall ADL's : Needs assistance/impaired                     Lower Body Dressing: Minimal assistance;Sit to/from stand;Cueing for sequencing;Cueing for safety;With adaptive equipment Lower Body Dressing Details (indicate cue type and reason): AE education- [name] will obtain Toilet Transfer: Ambulation;Minimal assistance;Stand-pivot Toilet Transfer Details (indicate cue type and reason): bed to chair Toileting- Clothing Manipulation and Hygiene: Moderate assistance;Sit to/from stand;Cueing for sequencing;Cueing for safety         General ADL Comments: pt did well with OT with minimal pain.  Ae education provided     Vision Patient Visual Report: No change from baseline     Perception     Praxis      Cognition  Arousal/Alertness: Awake/alert Behavior During Therapy: WFL for tasks assessed/performed Overall Cognitive Status: Within Functional Limits for tasks assessed                                                     Pertinent Vitals/ Pain       Pain Score: 2  Pain Location: back Pain Descriptors / Indicators: Sore;Discomfort Pain Intervention(s): Limited activity within patient's tolerance;Monitored during session  Home Living                                          Prior Functioning/Environment              Frequency  Min 2X/week        Progress Toward Goals  OT Goals(current goals can now be found in the care plan section)     Acute Rehab OT Goals Patient Stated Goal: less pain. to be able to move around better OT Goal Formulation: With patient Time For Goal Achievement: 01/30/18  Plan Discharge plan needs to be updated    Co-evaluation  AM-PAC OT "6 Clicks" Daily Activity     Outcome Measure   Help from another person eating meals?: None Help from another person taking care of personal grooming?: A Little Help from another person toileting, which includes using toliet, bedpan, or urinal?: A Little Help from another person bathing (including washing, rinsing, drying)?: A Little Help from another person to put on and taking off regular upper body clothing?: A Little Help from another person to put on and taking off regular lower body clothing?: A Little 6 Click Score: 19    End of Session Equipment Utilized During Treatment: Rolling walker  OT Visit Diagnosis: Unsteadiness on feet (R26.81);History of falling (Z91.81);Pain Pain - part of body: (back pain)   Activity Tolerance Patient tolerated treatment well   Patient Left in chair;with call bell/phone within reach   Nurse Communication Mobility status        Time: 3212-2482 OT Time Calculation (min): 28 min  Charges: OT General  Charges $OT Visit: 1 Visit OT Treatments $Self Care/Home Management : 23-37 mins  Larry Church, [place] [phone] Office- [phone]      Larry Church, Larry Church Church, 2:52 PM

## 2018-01-18 DIAGNOSIS — S32010A Wedge compression fracture of first lumbar vertebra, initial encounter for closed fracture: Secondary | ICD-10-CM

## 2018-01-18 MED ORDER — DOCUSATE SODIUM 283 MG RE ENEM
1.0000 | ENEMA | Freq: Once | RECTAL | Status: DC
  Filled 2018-01-18: qty 1

## 2018-01-18 MED ORDER — POLYETHYLENE GLYCOL 3350 17 G PO PACK
17.0000 g | PACK | Freq: Two times a day (BID) | ORAL | Status: AC
  Administered 2018-01-18 (×2): 17 g via ORAL
  Filled 2018-01-18 (×2): qty 1

## 2018-01-18 NOTE — Progress Notes (Signed)
Physical Therapy Treatment Patient Details Name: Larry Church MRN: [id number] DOB: 20-Nov-1932 Today's Date: 01/18/2018    History of Present Illness 84 yo male admitted with low back pain. MRI (+) acute L1 comp fx. Hx of morbid obesity, CAD, chronic back pain, SAH, falls, A fib, compression fxs T-L, hip replacements    PT Comments    Min guard assist for standing and short distance ambulation with RW. Pt rated pain 6-7/10. He seems to be tolerating sitting better than he was prior to admission.   Follow Up Recommendations  Home health PT;Supervision/Assistance - 24 hour     Equipment Recommendations  3in1 (PT)    Recommendations for Other Services       Precautions / Restrictions Precautions Precautions: Fall Restrictions Weight Bearing Restrictions: No    Mobility  Bed Mobility               General bed mobility comments: oob in recliner  Transfers Overall transfer level: Needs assistance Equipment used: Rolling walker (2 wheeled) Transfers: Sit to/from Stand Sit to Stand: Min guard         General transfer comment: close guard for safety. VCs safety.   Ambulation/Gait Ambulation/Gait assistance: Min guard Gait Distance (Feet): 10 Feet Assistive device: Rolling walker (2 wheeled) Gait Pattern/deviations: Step-through pattern;Decreased stride length     General Gait Details: Pt declined hallway ambulation on today. He did agree to taking a few steps in the room. No physical assistance given.    Stairs             Wheelchair Mobility    Modified Rankin (Stroke Patients Only)       Balance Overall balance assessment: Needs assistance;History of Falls           Standing balance-[name]: Poor                              Cognition Arousal/Alertness: Awake/alert Behavior During Therapy: WFL for tasks assessed/performed                                   General Comments: some confusion noted today  compared to eval day      Exercises      General Comments        Pertinent Vitals/Pain Pain Assessment: 0-10 Pain Score: 7  Pain Location: back Pain Descriptors / Indicators: Sore;Discomfort Pain Intervention(s): Limited activity within patient's tolerance;Repositioned    Home Living                      Prior Function            PT Goals (current goals can now be found in the care plan section) Progress towards PT goals: Progressing toward goals    Frequency    Min 3X/week      PT Plan Current plan remains appropriate    Co-evaluation              AM-PAC PT "6 Clicks" Mobility   Outcome Measure  Help needed turning from your back to your side while in a flat bed without using bedrails?: A Little Help needed moving from lying on your back to sitting on the side of a flat bed without using bedrails?: A Little Help needed moving to and from a bed to a chair (including a wheelchair)?: A Little Help  needed standing up from a chair using your arms (e.g., wheelchair or bedside chair)?: A Little Help needed to walk in hospital room?: A Little Help needed climbing 3-5 steps with a railing? : A Little 6 Click Score: 18    End of Session Equipment Utilized During Treatment: Gait belt Activity Tolerance: Patient limited by pain;Patient limited by fatigue Patient left: in chair;with call bell/phone within reach;with family/visitor present   PT Visit Diagnosis: Muscle weakness (generalized) (M62.81);Difficulty in walking, not elsewhere classified (R26.2);Pain;Unsteadiness on feet (R26.81);History of falling (Z91.81) Pain - part of body: (back)     Time: 1440-1457 PT Time Calculation (min) (ACUTE ONLY): 17 min  Charges:  $Gait Training: 8-22 mins                        Weston Anna, [place] Pager: [phone] Office: [phone]

## 2018-01-18 NOTE — Progress Notes (Signed)
[facility] PROGRESS NOTE    Progress Note  Larry Church  VOJ:[id number] DOB: 1932-11-21 DOA: 01/14/2018 PCP: Lilian Coma., MD     Brief Narrative:   Larry Church is an 84 y.o. male past medical history significant for morbid obesity, chronic compression fracture, chronic back pain, obstructive sleep apnea, CAD with a recent nuclear stress test and TEE that showed no acute findings a fall with a subarachnoid hemorrhage years ago presents with abdominal pain and back pain and a recent fall.  Assessment/Plan:   Low back pain due to L1 compression fracture X-ray did not show any fracture. MRI of the lumbar spine was done that showed no acute L1 burst fracture. On tramadol, will discuss with interventional radiologist. Physical therapy evaluated the patient recommended home health PT. To go to physical exam.  Abdominal pain:  In the setting of recent fall continue need tramadol.  A. fib with RVR: Newly diagnosed, cHA2ds2-Vasc=4. Not a candidate for anticoagulation due to her history recurrent falls and subarachnoid hemorrhage. Continue metoprolol.  Rate control. Talk to patient about risk and benefits he does not want to be on anticoagulation.  Hypotension: Resolved    Mild Cognitive impairment-acute confusional state: She had delirium overnight which responded well to Haldol. Continue Namenda.  Coronary artery disease: Status post cath in 2015, continue aspirin and Lipitor. Obstructive apnea  Obesity Estimated body mass index is 41.6 kg/m as calculated from the following:   Height as of this encounter: 5\' 5"  (1.651 m).   Weight as of this encounter: 113.4 kg.    DVT prophylaxis: lovenox Family Communication:wife Disposition Plan/Barrier to D/C: home with PT probably in am  12.5.2019. Code Status:     Code Status Orders  (From admission, onward)         Start     Ordered   01/14/18 2230  Full code  Continuous     01/14/18 2229        Code  Status History    Date Active Date Inactive Code Status Order ID Comments User Context   09/24/2014 1855 09/27/2014 1724 Full Code [id number]  Macie Burows Inpatient   02/20/2014 0128 02/21/2014 1657 Full Code [id number]  Etta Quill, DO Inpatient   12/10/2013 1732 12/11/2013 1846 Full Code [id number]  Leonie Man, MD Inpatient   12/10/2013 1116 12/10/2013 1732 Full Code [id number]  Fay Records, MD Inpatient   12/04/2012 1333 12/05/2012 1307 Full Code [id number]  Elaina Hoops, MD Inpatient    Advance Directive Documentation     Most Recent Value  Type of Advance Directive  Healthcare Power of [place], Living will  Pre-existing out of facility DNR order (yellow form or pink MOST form)  -  "MOST" Form in Place?  -        IV Access:    Peripheral IV   Procedures and diagnostic studies:   Dg Chest 2 View  Result Date: 01/16/2018 CLINICAL DATA:  Chronic cough.  High blood pressure EXAM: CHEST - 2 VIEW COMPARISON:  01/08/2018 FINDINGS: Cardiac silhouette is borderline enlarged. No mediastinal or hilar masses. No evidence of adenopathy. There are irregularly thickened interstitial markings bilaterally, which predominate peripherally. This is stable. No lung consolidation to suggest pneumonia. No evidence of pulmonary edema. No pleural effusion or pneumothorax. Skeletal structures are demineralized. Several mild compression deformities of the thoracic and upper lumbar spine. IMPRESSION: 1. No acute cardiopulmonary disease. 2. Chronic interstitial lung disease similar to the prior exam. Borderline cardiomegaly.  Electronically Signed   By: Lajean Manes M.D.   On: 01/16/2018 16:52   Mr Lumbar Spine Wo Contrast  Result Date: 01/17/2018 CLINICAL DATA:  Initial evaluation for acute back pain. EXAM: MRI LUMBAR SPINE WITHOUT CONTRAST TECHNIQUE: Multiplanar, multisequence MR imaging of the lumbar spine was performed. No intravenous contrast was administered. COMPARISON:  Prior MRI from  04/26/2016. FINDINGS: Segmentation:  Examination moderately degraded by motion artifact. Normal segmentation. Lowest well-formed disc labeled the L5-S1 level. Alignment: Trace 2 mm anterolisthesis of L2 on L3 and L5 on S1. Alignment otherwise normal with preservation of the normal lumbar lordosis. No malalignment. Vertebrae: There is an acute burst type compression fracture extending through the superior and mid aspect of the L1 vertebral body with extension through both the anterior and middle columns. Associated height loss of up to 30% with trace 2 mm bony retropulsion. This is benign/mechanical in appearance with no underlying pathologic lesion. Additional chronic compression fractures involving the T11, L2, L3, and L4 vertebral bodies noted. Vertebral body heights otherwise maintained. Underlying bone marrow signal intensity within normal limits. No discrete or worrisome osseous lesions. No other abnormal marrow edema. Conus medullaris and cauda equina: Conus extends to the L1-2 level. Conus and cauda equina appear normal. Paraspinal and other soft tissues: Chronic postsurgical changes present within the lower posterior paraspinous soft tissues. Paraspinous soft tissues demonstrate no obvious abnormality on this motion degraded exam. Disc levels: T10-11: Diffuse disc bulge with disc desiccation and intervertebral disc space narrowing. Right greater than left facet hypertrophy. Mild spinal stenosis with right neural foraminal narrowing, stable from previous. T11-12: Mild annular disc bulge with disc desiccation. Mild right-sided facet hypertrophy. No significant spinal stenosis. Foramina remain patent. T11-12: Disc bulge with tiny central disc protrusion minimally indenting the ventral thecal sac. Mild facet hypertrophy. No significant stenosis. L1-2: Mild diffuse disc bulge prior posterior decompression. Moderate facet hypertrophy. Resultant mild spinal stenosis, similar to previous. Foramina remain patent.  L2-3: Trace anterolisthesis. Mild diffuse disc bulge, asymmetric to the left. Left greater than right reactive endplate changes with marginal endplate osteophytic spurring. Prior posterior decompression. Bilateral facet hypertrophy. No significant spinal stenosis. Moderate left L2 foraminal narrowing, stable. L3-4: Chronic intervertebral disc spacing with diffuse disc bulge and disc desiccation. Prior left hemi laminectomy. Moderate facet hypertrophy. Residual mild left lateral recess grossly stable. Moderate bilateral L3 foraminal narrowing, left greater than right, also unchanged. L4-5: Chronic intervertebral disc space narrowing with diffuse disc bulge and disc desiccation. Superimposed reactive endplate changes with endplate osteophytic spurring. Prior wide posterior decompression. Moderate to advanced facet hypertrophy. No significant residual spinal stenosis. Moderate right with moderate to severe left L4 foraminal narrowing related to disc osteophyte and facet disease, relatively stable from previous. L5-S1: Trace anterolisthesis. Intervertebral disc space narrowing with disc desiccation and broad posterior disc bulge. Moderate facet hypertrophy. Resultant moderate spinal stenosis with left greater than right lateral recess narrowing, stable from previous. Moderate left greater than right foraminal narrowing also unchanged. IMPRESSION: 1. Acute burst type compression fracture involving the L1 vertebral body with up to 30% height loss with trace 2 mm bony retropulsion. This is benign/mechanical in appearance. 2. Additional multilevel chronic compression deformities involving the T11, L2, L3, and L4 vertebral bodies, stable. 3. Multilevel degenerative spondylolysis and facet arthrosis with sequelae of prior posterior decompression as above, unchanged relative to previous MRI from 2018. Please see above report for a full description of these findings. Electronically Signed   By: Jeannine Boga M.D.   On:  01/17/2018  20:42     Medical Consultants:    None.  Anti-Infectives:   None  Subjective:    Larry Church continues to have back pain, has been constipated his last bowel movement was about 4 days ago. Objective:    Vitals:   01/17/18 2050 01/17/18 2151 01/18/18 0435 01/18/18 1048  BP: 126/72  116/79 116/70  Pulse: 88 86 88 79  Resp: 18 18 18    Temp: 97.7 F (36.5 C)  98.7 F (37.1 C)   TempSrc: Oral  Oral   SpO2: 97% 96% 95%   Weight:      Height:        Intake/Output Summary (Last 24 hours) at 01/18/2018 1242 Last data filed at 01/18/2018 1100 Gross per 24 hour  Intake 483 ml  Output 175 ml  Net 308 ml   Filed Weights   01/14/18 1411  Weight: 113.4 kg    Exam: General exam: In no acute distress. Respiratory system: Good air movement and clear to auscultation. Cardiovascular system: S1 & S2 heard, RRR.  Gastrointestinal system: Abdomen is nondistended, soft and nontender.  Central nervous system: Alert and oriented. No focal neurological deficits. Extremities: No pedal edema. Skin: No rashes, lesions or ulcers Psychiatry: Judgement and insight appear normal. Mood & affect appropriate.    Data Reviewed:    Labs: Basic Metabolic Panel: Recent Labs  Lab 01/14/18 1550 01/17/18 2239  NA 137  --   K 3.9  --   CL 99  --   CO2 27  --   GLUCOSE 128*  --   BUN 16  --   CREATININE 0.95  --   CALCIUM 9.1  --   MG  --  2.2   GFR Estimated Creatinine Clearance: 68.6 mL/min (by C-G formula based on SCr of 0.95 mg/dL). Liver Function Tests: Recent Labs  Lab 01/14/18 1550  AST 30  ALT 34  ALKPHOS 77  BILITOT 0.9  PROT 6.6  ALBUMIN 3.3*   Recent Labs  Lab 01/14/18 1550  LIPASE 19   No results for input(s): AMMONIA in the last 168 hours. Coagulation profile No results for input(s): INR, PROTIME in the last 168 hours.  CBC: Recent Labs  Lab 01/14/18 1550 01/14/18 2341 01/16/18 1107  WBC 15.1*  --  12.8*  NEUTROABS 12.6*  --    --   HGB 14.9  --  13.2  HCT 46.0 40.2 41.5  MCV 104.5*  --  105.1*  PLT 242  --  225   Cardiac Enzymes: No results for input(s): CKTOTAL, CKMB, CKMBINDEX, TROPONINI in the last 168 hours. BNP (last 3 results) No results for input(s): PROBNP in the last 8760 hours. CBG: No results for input(s): GLUCAP in the last 168 hours. D-Dimer: No results for input(s): DDIMER in the last 72 hours. Hgb A1c: No results for input(s): HGBA1C in the last 72 hours. Lipid Profile: No results for input(s): CHOL, HDL, LDLCALC, TRIG, CHOLHDL, LDLDIRECT in the last 72 hours. Thyroid function studies: No results for input(s): TSH, T4TOTAL, T3FREE, THYROIDAB in the last 72 hours.  Invalid input(s): FREET3 Anemia work up: No results for input(s): VITAMINB12, FOLATE, FERRITIN, TIBC, IRON, RETICCTPCT in the last 72 hours. Sepsis Labs: Recent Labs  Lab 01/14/18 1550 01/16/18 1107  WBC 15.1* 12.8*   Microbiology No results found for this or any previous visit (from the past 240 hour(s)).   Medications:   . alfuzosin  10 mg Oral Daily  . aspirin EC  81  mg Oral Daily  . atorvastatin  40 mg Oral QHS  . diclofenac sodium  4 g Topical QID  . enoxaparin (LOVENOX) injection  55 mg Subcutaneous Daily  . finasteride  5 mg Oral Daily  . guaiFENesin  600 mg Oral BID  . loratadine  10 mg Oral Daily  . memantine  10 mg Oral BID  . metoprolol tartrate  25 mg Oral BID  . pantoprazole  40 mg Oral Daily  . polyethylene glycol  17 g Oral Daily  . senna  1 tablet Oral Daily  . sodium chloride flush  3 mL Intravenous Q12H  . venlafaxine XR  150 mg Oral Q breakfast  . venlafaxine XR  75 mg Oral QHS   Continuous Infusions: . sodium chloride       LOS: 4 days   Charlynne Cousins  [facility]   *Please refer to [email or web address], password [email or web address] to get updated schedule on who will round on this patient, as hospitalists switch teams weekly. If 7PM-7AM, please contact night-coverage at [email or web address],  password [email or web address] for any overnight needs.  01/18/2018, 12:42 PM

## 2018-01-19 DIAGNOSIS — R1084 Generalized abdominal pain: Secondary | ICD-10-CM

## 2018-01-19 DIAGNOSIS — W19XXXA Unspecified fall, initial encounter: Secondary | ICD-10-CM

## 2018-01-19 LAB — PROTIME-INR
INR: 1.03
Prothrombin Time: 13.4 seconds (ref 11.4–15.2)

## 2018-01-19 MED ORDER — TRAMADOL HCL 50 MG PO TABS: 50.0000 mg | ORAL_TABLET | ORAL | 0 refills | Status: DC | PRN

## 2018-01-19 MED ORDER — POLYETHYLENE GLYCOL 3350 17 G PO PACK
17.0000 g | PACK | Freq: Every day | ORAL | Status: DC
  Administered 2018-01-19: 17 g via ORAL
  Filled 2018-01-19: qty 1

## 2018-01-19 MED ORDER — POLYETHYLENE GLYCOL 3350 17 G PO PACK: 17.0000 g | PACK | Freq: Every day | ORAL | Status: DC

## 2018-01-19 NOTE — Progress Notes (Signed)
Aware of request for image-guided L1 kyphoplasty/vertebroplasty.  Procedure approved by Dr. Vernard Gambles. However, due to scheduling, we are unable to accommodate for procedure in IR until Monday 01/23/2018.  Informed Dr. Aileen Fass of scheduling and that procedure can be done on outpatient bias. Per Dr. Aileen Fass, patient to be discharge and return for procedure as OP 01/23/2018. Tiffany aware.  Bea Graff Jissell Trafton, PA-C 01/19/2018, 10:09 AM

## 2018-01-19 NOTE — Discharge Summary (Signed)
Physician Discharge Summary  Larry Church RJJ:[id number] DOB: May 08, 1932 DOA: 01/14/2018  PCP: Lilian Coma., MD  Admit date: 01/14/2018 Discharge date: 01/19/2018  Admitted From: home Disposition:  Home  Recommendations for Outpatient Follow-up:  1. Follow up with IR on 01/23/2018 for kyphoplasty.   Home Health:No Equipment/Devices:none  Discharge Condition:Stable CODE STATUS:full Diet recommendation: Heart Healthy  Brief/Interim Summary: 84 y.o. male past medical history significant for morbid obesity, chronic compression fracture, chronic back pain, obstructive sleep apnea, CAD with a recent nuclear stress test and TEE that showed no acute findings a fall with a subarachnoid hemorrhage years ago presents with abdominal pain and back pain and a recent fall.  Discharge Diagnoses:  Active Problems:   Low back pain   Obstructive apnea   Morbid obesity (HCC)   Atrial fibrillation (HCC)   Abdominal pain, right lower quadrant   Mild cognitive impairment   BPH (benign prostatic hyperplasia)   Macrocytosis   Atrial fibrillation with rapid ventricular response ([place])   Fall with injury Low back pain due to L1 compression fracture due to mechanical fall: X-ray of the back did not show any fractures, MRI of the lumbar spine was done that showed an acute L1 burst fracture. Patient was started on tramadol, interventional radiologist consulted recommended to do the procedure on 01/23/2018 due to availability. Physical therapy saw the patient and record and recommended home health PT.  Abdominal pain: Likely due to recent fall controlled with tramadol.  A. fib with RVR newly diagnosed: cHA2ds2-Vasc=4. Candidate for anticoagulation due to history of recurrent falls and subarachnoid hemorrhage. He is currently in sinus rhythm rate controlled metoprolol.  Hypertension: Resolved of unclear etiology.  Mild cognitive impairment/acute confusional state: Mild delirium overnight which  responded well to Haldol. Continue Namenda as an outpatient.   Discharge Instructions  Discharge Instructions    Diet - low sodium heart healthy   Complete by:  As directed    Increase activity slowly   Complete by:  As directed      Allergies as of 01/19/2018      Reactions   Penicillins Anaphylaxis, Swelling   Has patient had a PCN reaction causing immediate rash, facial/tongue/throat swelling, SOB or lightheadedness with hypotension: Yes Has patient had a PCN reaction causing severe rash involving mucus membranes or skin necrosis: No Has patient had a PCN reaction that required hospitalization: No Has patient had a PCN reaction occurring within the last 10 years: No If all of the above answers are "NO", then may proceed with Cephalosporin use.   Bee Venom Swelling   Donepezil Other (See Comments)   Vertigo   Methocarbamol    Tiredness      Medication List    TAKE these medications   acetaminophen 500 MG tablet Commonly known as:  TYLENOL Take 500-1,000 mg by mouth every 6 (six) hours as needed for mild pain.   alfuzosin 10 MG 24 hr tablet Commonly known as:  UROXATRAL Take 10 mg by mouth daily.   aspirin EC 81 MG tablet Take 1 tablet (81 mg total) by mouth daily.   atorvastatin 40 MG tablet Commonly known as:  LIPITOR TAKE 1 TABLET(40 MG) BY MOUTH DAILY What changed:  See the new instructions.   b complex vitamins tablet Take 1 tablet by mouth daily.   cyclobenzaprine 10 MG tablet Commonly known as:  FLEXERIL Take 1 tablet (10 mg total) by mouth 2 (two) times daily as needed for muscle spasms.   EPINEPHrine 0.3 mg/0.3 mL  Soaj injection Commonly known as:  EPI-PEN Inject 0.3 mLs (0.3 mg total) into the muscle once.   finasteride 5 MG tablet Commonly known as:  PROSCAR Take 5 mg by mouth daily.   memantine 10 MG tablet Commonly known as:  NAMENDA Take 10 mg by mouth 2 (two) times daily.   metoprolol tartrate 25 MG tablet Commonly known as:   LOPRESSOR TAKE 1/2 TABLET(12.5 MG) BY MOUTH TWICE DAILY What changed:  See the new instructions.   MUCINEX DM MAXIMUM STRENGTH 60-1200 MG Tb12 Take 1 tablet by mouth every 12 (twelve) hours as needed (congestion).   nitroGLYCERIN 0.4 MG SL tablet Commonly known as:  NITROSTAT Place 1 tablet (0.4 mg total) under the tongue every 5 (five) minutes x 3 doses as needed for chest pain.   omeprazole 40 MG capsule Commonly known as:  PRILOSEC Take 40 mg by mouth 2 (two) times daily.   traMADol 50 MG tablet Commonly known as:  ULTRAM Take 1 tablet (50 mg total) by mouth every 4 (four) hours as needed for moderate pain.   venlafaxine XR 75 MG 24 hr capsule Commonly known as:  EFFEXOR-XR Take 75-150 capsules by mouth See admin instructions. Take 150 mg by mouth in the morning and 75 mg by mouth at bedtime   XYZAL 5 MG tablet Generic drug:  levocetirizine Take 5 mg by mouth daily.       Allergies  Allergen Reactions  . Penicillins Anaphylaxis and Swelling    Has patient had a PCN reaction causing immediate rash, facial/tongue/throat swelling, SOB or lightheadedness with hypotension: Yes Has patient had a PCN reaction causing severe rash involving mucus membranes or skin necrosis: No Has patient had a PCN reaction that required hospitalization: No Has patient had a PCN reaction occurring within the last 10 years: No If all of the above answers are "NO", then may proceed with Cephalosporin use.  . Bee Venom Swelling  . Donepezil Other (See Comments)    Vertigo  . Methocarbamol     Tiredness    Consultations:  IR   Procedures/Studies: Dg Chest 2 View  Result Date: 01/16/2018 CLINICAL DATA:  Chronic cough.  High blood pressure EXAM: CHEST - 2 VIEW COMPARISON:  01/08/2018 FINDINGS: Cardiac silhouette is borderline enlarged. No mediastinal or hilar masses. No evidence of adenopathy. There are irregularly thickened interstitial markings bilaterally, which predominate peripherally.  This is stable. No lung consolidation to suggest pneumonia. No evidence of pulmonary edema. No pleural effusion or pneumothorax. Skeletal structures are demineralized. Several mild compression deformities of the thoracic and upper lumbar spine. IMPRESSION: 1. No acute cardiopulmonary disease. 2. Chronic interstitial lung disease similar to the prior exam. Borderline cardiomegaly. Electronically Signed   By: Lajean Manes M.D.   On: 01/16/2018 16:52   Dg Chest 2 View  Result Date: 01/08/2018 CLINICAL DATA:  Cough. EXAM: CHEST - 2 VIEW COMPARISON:  11/29/2015 and prior radiographs FINDINGS: Cardiomegaly and chronic interstitial prominence/peribronchial thickening again noted. There is no evidence of focal airspace disease, pulmonary edema, suspicious pulmonary nodule/mass, pleural effusion, or pneumothorax. No acute bony abnormalities are identified. IMPRESSION: Cardiomegaly without evidence of acute cardiopulmonary disease. Mild chronic interstitial prominence/peribronchial thickening which may represent interstitial lung disease. Electronically Signed   By: Margarette Canada M.D.   On: 01/08/2018 10:41   Dg Lumbar Spine Complete  Result Date: 01/08/2018 CLINICAL DATA:  Lower back pain EXAM: LUMBAR SPINE - COMPLETE 4+ VIEW COMPARISON:  04/26/2016 FINDINGS: Normal alignment. Severe multilevel degenerative spondylosis throughout the  visualized lower thoracic and lumbar spine at all levels. These changes are most severe at L2-3 and L4-5. Chronic compression fracture deformities at L2 and L4. Posterior facet arthropathy noted from L1-S1. Bones are osteopenic. No visualized pars defects. Normal SI joints for age. Bilateral hip arthroplasties noted. Normal bowel gas pattern.  Aorta atherosclerotic. IMPRESSION: Severe thoracolumbar degenerative spondylosis and remote L2 and L4 compression fractures. Osteopenia No significant interval change by plain radiography Abdominal aortic atherosclerosis Electronically Signed    By: Jerilynn Mages.  Shick M.D.   On: 01/08/2018 08:24   Mr Lumbar Spine Wo Contrast  Result Date: 01/17/2018 CLINICAL DATA:  Initial evaluation for acute back pain. EXAM: MRI LUMBAR SPINE WITHOUT CONTRAST TECHNIQUE: Multiplanar, multisequence MR imaging of the lumbar spine was performed. No intravenous contrast was administered. COMPARISON:  Prior MRI from 04/26/2016. FINDINGS: Segmentation:  Examination moderately degraded by motion artifact. Normal segmentation. Lowest well-formed disc labeled the L5-S1 level. Alignment: Trace 2 mm anterolisthesis of L2 on L3 and L5 on S1. Alignment otherwise normal with preservation of the normal lumbar lordosis. No malalignment. Vertebrae: There is an acute burst type compression fracture extending through the superior and mid aspect of the L1 vertebral body with extension through both the anterior and middle columns. Associated height loss of up to 30% with trace 2 mm bony retropulsion. This is benign/mechanical in appearance with no underlying pathologic lesion. Additional chronic compression fractures involving the T11, L2, L3, and L4 vertebral bodies noted. Vertebral body heights otherwise maintained. Underlying bone marrow signal intensity within normal limits. No discrete or worrisome osseous lesions. No other abnormal marrow edema. Conus medullaris and cauda equina: Conus extends to the L1-2 level. Conus and cauda equina appear normal. Paraspinal and other soft tissues: Chronic postsurgical changes present within the lower posterior paraspinous soft tissues. Paraspinous soft tissues demonstrate no obvious abnormality on this motion degraded exam. Disc levels: T10-11: Diffuse disc bulge with disc desiccation and intervertebral disc space narrowing. Right greater than left facet hypertrophy. Mild spinal stenosis with right neural foraminal narrowing, stable from previous. T11-12: Mild annular disc bulge with disc desiccation. Mild right-sided facet hypertrophy. No significant  spinal stenosis. Foramina remain patent. T11-12: Disc bulge with tiny central disc protrusion minimally indenting the ventral thecal sac. Mild facet hypertrophy. No significant stenosis. L1-2: Mild diffuse disc bulge prior posterior decompression. Moderate facet hypertrophy. Resultant mild spinal stenosis, similar to previous. Foramina remain patent. L2-3: Trace anterolisthesis. Mild diffuse disc bulge, asymmetric to the left. Left greater than right reactive endplate changes with marginal endplate osteophytic spurring. Prior posterior decompression. Bilateral facet hypertrophy. No significant spinal stenosis. Moderate left L2 foraminal narrowing, stable. L3-4: Chronic intervertebral disc spacing with diffuse disc bulge and disc desiccation. Prior left hemi laminectomy. Moderate facet hypertrophy. Residual mild left lateral recess grossly stable. Moderate bilateral L3 foraminal narrowing, left greater than right, also unchanged. L4-5: Chronic intervertebral disc space narrowing with diffuse disc bulge and disc desiccation. Superimposed reactive endplate changes with endplate osteophytic spurring. Prior wide posterior decompression. Moderate to advanced facet hypertrophy. No significant residual spinal stenosis. Moderate right with moderate to severe left L4 foraminal narrowing related to disc osteophyte and facet disease, relatively stable from previous. L5-S1: Trace anterolisthesis. Intervertebral disc space narrowing with disc desiccation and broad posterior disc bulge. Moderate facet hypertrophy. Resultant moderate spinal stenosis with left greater than right lateral recess narrowing, stable from previous. Moderate left greater than right foraminal narrowing also unchanged. IMPRESSION: 1. Acute burst type compression fracture involving the L1 vertebral body with up  to 30% height loss with trace 2 mm bony retropulsion. This is benign/mechanical in appearance. 2. Additional multilevel chronic compression  deformities involving the T11, L2, L3, and L4 vertebral bodies, stable. 3. Multilevel degenerative spondylolysis and facet arthrosis with sequelae of prior posterior decompression as above, unchanged relative to previous MRI from 2018. Please see above report for a full description of these findings. Electronically Signed   By: Jeannine Boga M.D.   On: 01/17/2018 20:42   Ct Abdomen Pelvis W Contrast  Result Date: 01/14/2018 CLINICAL DATA:  Abdominal discomfort right lower quadrant 2 weeks. Recent falls. EXAM: CT ABDOMEN AND PELVIS WITH CONTRAST TECHNIQUE: Multidetector CT imaging of the abdomen and pelvis was performed using the standard protocol following bolus administration of intravenous contrast. CONTRAST:  121mL ISOVUE-300 IOPAMIDOL (ISOVUE-300) INJECTION 61% COMPARISON:  01/10/2018 FINDINGS: Lower chest: Exam demonstrates bibasilar interstitial changes and mild bronchiectasis which is stable. No focal airspace consolidation or effusion. Mild cardiomegaly. Calcification of the mitral valve annulus. Calcified plaque over the right coronary artery. Hepatobiliary: Previous cholecystectomy. Liver and biliary tree are normal. Pancreas: Mild fatty atrophy of the pancreas which is otherwise within normal. Spleen: Normal. Adrenals/Urinary Tract: Adrenal glands are normal. Mild atrophy of the right kidney unchanged. Left kidney normal in size. No hydronephrosis or nephrolithiasis. Visualized ureters and bladder are normal. The distal ureters are obscured by streak artifact from bilateral hip prostheses. Stomach/Bowel: Stomach is normal. Small bowel is unremarkable. Previous appendectomy. Minimal diverticulosis of the colon. Vascular/Lymphatic: Moderate calcified plaque over the abdominal aorta. No adenopathy. Reproductive: Unremarkable. Other: No free fluid or focal inflammatory change. Musculoskeletal: Multiple compression fractures over the thoracolumbar spine unchanged. Bilateral total hip arthroplasties  unchanged. IMPRESSION: No acute findings in the abdomen/pelvis. Stable bibasilar interstitial changes with bronchiectasis. Mild colonic diverticulosis. Aortic Atherosclerosis (ICD10-I70.0). Cardiomegaly and atherosclerotic coronary artery disease. Multiple stable compression fractures over the thoracolumbar spine. Electronically Signed   By: Marin Olp M.D.   On: 01/14/2018 18:45   Ct Renal Stone Study  Result Date: 01/10/2018 CLINICAL DATA:  84 year old male with difficulty urinating and constipation for the past 3 days. Prior appendectomy. Initial encounter. EXAM: CT ABDOMEN AND PELVIS WITHOUT CONTRAST TECHNIQUE: Multidetector CT imaging of the abdomen and pelvis was performed following the standard protocol without IV contrast. COMPARISON:  05/14/2015 CT abdomen and pelvis. FINDINGS: Lower chest: Nonspecific chronic changes lung bases with minimal pleural thickening. Heart top-normal size. Mitral valve and right coronary artery calcifications. Prominent epicardial fat. Hepatobiliary: Taking into account limitation by non contrast imaging, no worrisome hepatic lesion. Post cholecystectomy. No calcified common bile duct stone. Pancreas: Taking into account limitation by non contrast imaging, no worrisome pancreatic lesion. Fatty infiltrated. Spleen: Taking into account limitation by non contrast imaging, no splenic mass or enlargement. Adrenals/Urinary Tract: No obstructing stone or hydronephrosis. Atrophic right kidney unchanged. Taking into account limitation by non contrast imaging, no worrisome renal or adrenal lesion. Evaluation of the urinary bladder is limited by streak artifact from hip replacements. The portion which is visualized appears unremarkable. Stomach/Bowel: Rectosigmoid region slightly limited by artifact from hip replacements. Mild sigmoid diverticulosis without evidence diverticulitis. There is a small area of fat necrosis superior to the urinary bladder and inferior to sigmoid colon  but does not appear to be an acute abnormality. Moderate stool throughout the colon. The cecum is displaced superiorly and medially. Under distended stomach limits evaluation.  No gross abnormality. Vascular/Lymphatic: Atherosclerotic changes aorta and aortic branch vessels. Focal ectasia infrarenal abdominal aorta measuring up to 2.5 cm  versus prior 2.4 cm. Ectatic celiac artery measuring up to 1.3 cm versus prior 1.2 cm. Scattered normal size lymph nodes. Reproductive: Evaluation of prostate gland limited secondary to streak artifact from hip replacements. Other: No free air or bowel containing hernia. Fat containing inguinal hernias. Musculoskeletal: New L1 superior endplate mild compression fracture with 10% loss of height. Subchondral gas suggests that loss of height may progress with time. Loss of height/Schmorl's node deformities T11, L2 and L4 with degenerative changes throughout the lumbar spine similar to prior exam. Prior surgery lower lumbar region. Post bilateral hip replacements. Left acetabular screw partially enters the pelvis unchanged. IMPRESSION: 1. Mild sigmoid diverticulosis without evidence diverticulitis. 2. Moderate stool throughout the colon. The cecum is displaced superiorly and medially. 3. Evaluation of urinary bladder, rectosigmoid region and prostate gland limited by streak artifact caused by hip replacements. 4. New L1 superior endplate mild compression fracture with 10% loss of height. Subchondral gas suggests that loss of height may progress with time. 5. Aortic Atherosclerosis (ICD10-I70.0). Focal ectasia infrarenal abdominal aorta measuring up to 2.5 cm versus prior 2.4 cm. Ectatic celiac artery measuring up to 1.3 cm versus prior 1.2 cm. Ectatic abdominal aorta at risk for aneurysm development. Recommend followup by ultrasound in 5 years. This recommendation follows ACR consensus guidelines: White Paper of the ACR Incidental Findings Committee II on Vascular Findings. J Am Coll  Radiol 2013; 10:789-794. 6. No obstructing stone or hydronephrosis. Slightly atrophic right kidney once again noted. Electronically Signed   By: Genia Del M.D.   On: 01/10/2018 14:39   Subjective: No complaints feels great.  Discharge Exam: Vitals:   01/19/18 0646 01/19/18 0851  BP: 107/85 109/88  Pulse: 84 69  Resp: 20   Temp: 98.2 F (36.8 C)   SpO2: 96%    Vitals:   01/18/18 2117 01/18/18 2131 01/19/18 0646 01/19/18 0851  BP:  (!) 150/99 107/85 109/88  Pulse: 93 72 84 69  Resp: 20 16 20    Temp:  98.3 F (36.8 C) 98.2 F (36.8 C)   TempSrc:  Oral Oral   SpO2: 94% 93% 96%   Weight:      Height:        General: Pt is alert, awake, not in acute distress Cardiovascular: RRR, S1/S2 +, no rubs, no gallops Respiratory: CTA bilaterally, no wheezing, no rhonchi Abdominal: Soft, NT, ND, bowel sounds + Extremities: no edema, no cyanosis    The results of significant diagnostics from this hospitalization (including imaging, microbiology, ancillary and laboratory) are listed below for reference.     Microbiology: No results found for this or any previous visit (from the past 240 hour(s)).   Labs: BNP (last 3 results) Recent Labs    01/08/18 0917  BNP 025.8*   Basic Metabolic Panel: Recent Labs  Lab 01/14/18 1550 01/17/18 2239  NA 137  --   K 3.9  --   CL 99  --   CO2 27  --   GLUCOSE 128*  --   BUN 16  --   CREATININE 0.95  --   CALCIUM 9.1  --   MG  --  2.2   Liver Function Tests: Recent Labs  Lab 01/14/18 1550  AST 30  ALT 34  ALKPHOS 77  BILITOT 0.9  PROT 6.6  ALBUMIN 3.3*   Recent Labs  Lab 01/14/18 1550  LIPASE 19   No results for input(s): AMMONIA in the last 168 hours. CBC: Recent Labs  Lab 01/14/18 1550 01/14/18 2341  01/16/18 1107  WBC 15.1*  --  12.8*  NEUTROABS 12.6*  --   --   HGB 14.9  --  13.2  HCT 46.0 40.2 41.5  MCV 104.5*  --  105.1*  PLT 242  --  225   Cardiac Enzymes: No results for input(s): CKTOTAL, CKMB,  CKMBINDEX, TROPONINI in the last 168 hours. BNP: Invalid input(s): POCBNP CBG: No results for input(s): GLUCAP in the last 168 hours. D-Dimer No results for input(s): DDIMER in the last 72 hours. Hgb A1c No results for input(s): HGBA1C in the last 72 hours. Lipid Profile No results for input(s): CHOL, HDL, LDLCALC, TRIG, CHOLHDL, LDLDIRECT in the last 72 hours. Thyroid function studies No results for input(s): TSH, T4TOTAL, T3FREE, THYROIDAB in the last 72 hours.  Invalid input(s): FREET3 Anemia work up No results for input(s): VITAMINB12, FOLATE, FERRITIN, TIBC, IRON, RETICCTPCT in the last 72 hours. Urinalysis    Component Value Date/Time   COLORURINE YELLOW 01/14/2018 1513   APPEARANCEUR CLEAR 01/14/2018 1513   LABSPEC 1.016 01/14/2018 1513   PHURINE 6.0 01/14/2018 1513   GLUCOSEU NEGATIVE 01/14/2018 1513   HGBUR NEGATIVE 01/14/2018 1513   BILIRUBINUR NEGATIVE 01/14/2018 1513   KETONESUR 5 (A) 01/14/2018 1513   PROTEINUR NEGATIVE 01/14/2018 1513   UROBILINOGEN 0.2 09/24/2014 0909   NITRITE NEGATIVE 01/14/2018 1513   LEUKOCYTESUR NEGATIVE 01/14/2018 1513   Sepsis Labs Invalid input(s): PROCALCITONIN,  WBC,  LACTICIDVEN Microbiology No results found for this or any previous visit (from the past 240 hour(s)).   Time coordinating discharge:40 minutes  SIGNED:   Charlynne Cousins, MD  [facility] 01/19/2018, 11:55 AM Pager   If 7PM-7AM, please contact night-coverage [email or web address] Password [email or web address]

## 2018-01-20 ENCOUNTER — Other Ambulatory Visit: Payer: Self-pay | Admitting: Internal Medicine

## 2018-01-20 ENCOUNTER — Other Ambulatory Visit: Payer: Self-pay | Admitting: Radiology

## 2018-01-20 DIAGNOSIS — S32010A Wedge compression fracture of first lumbar vertebra, initial encounter for closed fracture: Secondary | ICD-10-CM

## 2018-01-20 NOTE — Progress Notes (Signed)
Opened discharge chart to confirm [facility] orders. Pt's daughter Emeline [facility] [phone] called to state pt did not have HHPT/OT. Pt was given CMS Compare list. However, pt and daughter never make a decision on the [facility] agency. Also there was no HH orders. Received verbal order for HHPT/OT, face to face in chart. Pt's daughter selected [facility]. In house rep was called.

## 2018-01-23 ENCOUNTER — Ambulatory Visit (HOSPITAL_COMMUNITY)
Admission: RE | Admit: 2018-01-23 | Discharge: 2018-01-23 | Disposition: A | Discharge status: Home / Self Care | Payer: Medicare Other | Source: Ambulatory Visit | Attending: Internal Medicine | Admitting: Internal Medicine

## 2018-01-23 ENCOUNTER — Other Ambulatory Visit (HOSPITAL_COMMUNITY): Payer: Medicare Other

## 2018-01-23 ENCOUNTER — Ambulatory Visit (HOSPITAL_COMMUNITY)
Admission: RE | Admit: 2018-01-23 | Discharge: 2018-01-23 | Disposition: A | Discharge status: Home / Self Care | Payer: Medicare Other | Source: Ambulatory Visit | Attending: Interventional Radiology | Admitting: Interventional Radiology

## 2018-01-23 ENCOUNTER — Encounter (HOSPITAL_COMMUNITY): Payer: Self-pay

## 2018-01-23 DIAGNOSIS — M8580 Other specified disorders of bone density and structure, unspecified site: Secondary | ICD-10-CM | POA: Diagnosis not present

## 2018-01-23 DIAGNOSIS — K219 Gastro-esophageal reflux disease without esophagitis: Secondary | ICD-10-CM | POA: Insufficient documentation

## 2018-01-23 DIAGNOSIS — Z955 Presence of coronary angioplasty implant and graft: Secondary | ICD-10-CM | POA: Insufficient documentation

## 2018-01-23 DIAGNOSIS — I2510 Atherosclerotic heart disease of native coronary artery without angina pectoris: Secondary | ICD-10-CM | POA: Insufficient documentation

## 2018-01-23 DIAGNOSIS — G4733 Obstructive sleep apnea (adult) (pediatric): Secondary | ICD-10-CM | POA: Insufficient documentation

## 2018-01-23 DIAGNOSIS — M4856XA Collapsed vertebra, not elsewhere classified, lumbar region, initial encounter for fracture: Secondary | ICD-10-CM | POA: Insufficient documentation

## 2018-01-23 DIAGNOSIS — I10 Essential (primary) hypertension: Secondary | ICD-10-CM | POA: Insufficient documentation

## 2018-01-23 DIAGNOSIS — K5730 Diverticulosis of large intestine without perforation or abscess without bleeding: Secondary | ICD-10-CM | POA: Insufficient documentation

## 2018-01-23 DIAGNOSIS — Z79899 Other long term (current) drug therapy: Secondary | ICD-10-CM | POA: Insufficient documentation

## 2018-01-23 DIAGNOSIS — F329 Major depressive disorder, single episode, unspecified: Secondary | ICD-10-CM | POA: Insufficient documentation

## 2018-01-23 DIAGNOSIS — F419 Anxiety disorder, unspecified: Secondary | ICD-10-CM | POA: Insufficient documentation

## 2018-01-23 DIAGNOSIS — N400 Enlarged prostate without lower urinary tract symptoms: Secondary | ICD-10-CM | POA: Insufficient documentation

## 2018-01-23 DIAGNOSIS — Z85828 Personal history of other malignant neoplasm of skin: Secondary | ICD-10-CM | POA: Insufficient documentation

## 2018-01-23 DIAGNOSIS — Z7982 Long term (current) use of aspirin: Secondary | ICD-10-CM | POA: Insufficient documentation

## 2018-01-23 DIAGNOSIS — Z880 Allergy status to penicillin: Secondary | ICD-10-CM | POA: Diagnosis not present

## 2018-01-23 DIAGNOSIS — S32010A Wedge compression fracture of first lumbar vertebra, initial encounter for closed fracture: Secondary | ICD-10-CM

## 2018-01-23 DIAGNOSIS — J45909 Unspecified asthma, uncomplicated: Secondary | ICD-10-CM | POA: Insufficient documentation

## 2018-01-23 DIAGNOSIS — Z87891 Personal history of nicotine dependence: Secondary | ICD-10-CM | POA: Diagnosis not present

## 2018-01-23 HISTORY — PX: IR KYPHO LUMBAR INC FX REDUCE BONE BX UNI/BIL CANNULATION INC/IMAGING: IMG5519

## 2018-01-23 LAB — PROTIME-INR
INR: 0.96
Prothrombin Time: 12.7 seconds (ref 11.4–15.2)

## 2018-01-23 LAB — CBC
HCT: 44.8 % (ref 39.0–52.0)
Hemoglobin: 14.6 g/dL (ref 13.0–17.0)
MCH: 34 pg (ref 26.0–34.0)
MCHC: 32.6 g/dL (ref 30.0–36.0)
MCV: 104.4 fL — ABNORMAL HIGH (ref 80.0–100.0)
Platelets: 267 10*3/uL (ref 150–400)
RBC: 4.29 MIL/uL (ref 4.22–5.81)
RDW: 15.6 % — ABNORMAL HIGH (ref 11.5–15.5)
WBC: 11.9 10*3/uL — ABNORMAL HIGH (ref 4.0–10.5)
nRBC: 0 % (ref 0.0–0.2)

## 2018-01-23 LAB — APTT: aPTT: 26 seconds (ref 24–36)

## 2018-01-23 MED ORDER — CLINDAMYCIN PHOSPHATE 900 MG/50ML IV SOLN
900.0000 mg | Freq: Once | INTRAVENOUS | Status: AC
  Administered 2018-01-23: 900 mg via INTRAVENOUS

## 2018-01-23 MED ORDER — LIDOCAINE HCL (PF) 1 % IJ SOLN
INTRAMUSCULAR | Status: AC
  Filled 2018-01-23: qty 30

## 2018-01-23 MED ORDER — VANCOMYCIN HCL IN DEXTROSE 1-5 GM/200ML-% IV SOLN: 1000.0000 mg | INTRAVENOUS | Status: DC

## 2018-01-23 MED ORDER — CLINDAMYCIN PHOSPHATE 900 MG/50ML IV SOLN
INTRAVENOUS | Status: AC
  Administered 2018-01-23: 900 mg via INTRAVENOUS
  Filled 2018-01-23: qty 50

## 2018-01-23 MED ORDER — MIDAZOLAM HCL 2 MG/2ML IJ SOLN
INTRAMUSCULAR | Status: AC
  Filled 2018-01-23: qty 4

## 2018-01-23 MED ORDER — SODIUM CHLORIDE 0.9 % IV SOLN: INTRAVENOUS | Status: DC

## 2018-01-23 MED ORDER — IOPAMIDOL (ISOVUE-300) INJECTION 61%
50.0000 mL | Freq: Once | INTRAVENOUS | Status: AC | PRN
  Administered 2018-01-23: 20 mL

## 2018-01-23 MED ORDER — FENTANYL CITRATE (PF) 100 MCG/2ML IJ SOLN
INTRAMUSCULAR | Status: AC | PRN
  Administered 2018-01-23: 50 ug via INTRAVENOUS

## 2018-01-23 MED ORDER — VANCOMYCIN HCL IN DEXTROSE 1-5 GM/200ML-% IV SOLN
INTRAVENOUS | Status: AC
  Filled 2018-01-23: qty 200

## 2018-01-23 MED ORDER — IOPAMIDOL (ISOVUE-300) INJECTION 61%
INTRAVENOUS | Status: AC
  Administered 2018-01-23: 20 mL
  Filled 2018-01-23: qty 50

## 2018-01-23 MED ORDER — FENTANYL CITRATE (PF) 100 MCG/2ML IJ SOLN
INTRAMUSCULAR | Status: AC
  Filled 2018-01-23: qty 4

## 2018-01-23 MED ORDER — LIDOCAINE HCL (PF) 1 % IJ SOLN
INTRAMUSCULAR | Status: AC | PRN
  Administered 2018-01-23 (×2): 10 mL via SUBCUTANEOUS

## 2018-01-23 MED ORDER — MIDAZOLAM HCL 2 MG/2ML IJ SOLN
INTRAMUSCULAR | Status: AC | PRN
  Administered 2018-01-23: 1 mg via INTRAVENOUS
  Administered 2018-01-23: 0.5 mg via INTRAVENOUS

## 2018-01-23 NOTE — Sedation Documentation (Signed)
This RN notified by Jerilynn Mages. [place], Rad. Tech, that patient sustained a skin tear to R posterior forearm after transfer from prone position on IR table to supine position on stretcher. Skin tear assessed by this RN. Pressure applied to skin tear site with dry sterile gauze by M. [place] for hemostasis. This RN cleansed site gently with soap and water, patted dry with dry sterile gauze. Vaseline gauze applied and covered with Tegaderm. Dr Vernard Gambles notified. Patient's wife informed of above.

## 2018-01-23 NOTE — Procedures (Signed)
  Procedure: L1 KP   EBL:   minimal Complications:  none immediate  See full dictation in [facility].  Dillard Cannon MD Main # [phone] Pager  [phone]

## 2018-01-23 NOTE — Consult Note (Signed)
Chief Complaint: Patient was seen in consultation today for L1 vertebroplasty/kyphoplasty   Referring Physician(s): Charlynne Cousins  Supervising Physician: Arne Cleveland  Patient Status: [facility] - Out-pt  History of Present Illness: Larry Church is an 84 y.o. male with PMH sig for obesity, OSA, CAD, afib, BPH, HTN, GERD, chronic thoraco-lumbar compression fractures and prior SAH after fall several years ago who presents now after another recent fall at home with persistent mid to low back pain. Subsequent imaging revealed acute burst type fracture of L1 with up to 30% height loss. He is scheduled today for L1 VP/KP.  Past Medical History:  Diagnosis Date  . Anxiety   . Arthritis    "bad; all over" (09/24/2014)  . Asthma   . Basal cell carcinoma    "cut and burned off  top of head and face"  . Chronic bronchitis ([place])    "got it q yr til this year" (09/24/2014)  . Chronic lower back pain   . Chronic sinusitis   . Coronary artery disease    LHC 10/15: pLAD 20-30, dLAD 50, origin D1 40; oLCx 40 then irregs; oRI 20, RCA diff irregs up to 10-20  . Degenerative arthritis   . Depression   . Echocardiograms    Echo 10/19: EF 55-60, no RWMA, mild AS (mean 14, peak 26), mild to mod AI, ascending aorta 40 mm, MAC, mild LAE, mild reduced RVSF, mild TR, PASP 43  . GERD (gastroesophageal reflux disease)   . Hypercholesterolemia   . Leg pain    ABIs 11/17: normal  . LV dysfunction    Echo 10/15: Mod LVH, EF 40-50, no apical clot, aortic sclerosis, MAC, trivial MR, mild LAE  . Nuclear stress test    Nuclear stress test 10/19: EF 46, no ischemia; intermediate risk  . OSA on CPAP   . PVC's (premature ventricular contractions)    "I inherited a 4th skip from my mother"  . Seasonal allergies     Past Surgical History:  Procedure Laterality Date  . ANTERIOR CERVICAL DECOMP/DISCECTOMY FUSION  1998  . ANTERIOR CERVICAL DISCECTOMY  1989  . APPENDECTOMY  1967  . BACK SURGERY    .  BASAL CELL CARCINOMA EXCISION     "top of my head"  . CARPAL TUNNEL RELEASE Bilateral 1980's  . CATARACT EXTRACTION W/ INTRAOCULAR LENS  IMPLANT, BILATERAL Bilateral 2013  . CHOLECYSTECTOMY    . COLONOSCOPY    . CORONARY ANGIOPLASTY WITH STENT PLACEMENT  ~ 1992  . EYE MUSCLE SURGERY Right 2013  . JOINT REPLACEMENT    . LEFT HEART CATHETERIZATION WITH CORONARY ANGIOGRAM N/A 12/10/2013   Procedure: LEFT HEART CATHETERIZATION WITH CORONARY ANGIOGRAM;  Surgeon: Leonie Man, MD;  Location: [facility] CATH LAB;  Service: Cardiovascular;  Laterality: N/A;  . LUMBAR LAMINECTOMY/DECOMPRESSION MICRODISCECTOMY Bilateral 12/04/2012   Procedure: BILATERAL LUMBAR LAMINECTOMY/DECOMPRESSION MICRODISCECTOMY LUMBAR FOUR-TO FIVE SACRALONE;  Surgeon: Elaina Hoops, MD;  Location: [facility];  Service: Neurosurgery;  Laterality: Bilateral;  . REVISION TOTAL HIP ARTHROPLASTY Right 2002   "changed a ball"  . SHOULDER ARTHROSCOPY Bilateral    bone spurs  . TONSILLECTOMY  ~ 1947  . TOTAL HIP ARTHROPLASTY Bilateral 1989-1995   "right-left"  . TOTAL KNEE ARTHROPLASTY Left 09/24/2014  . TOTAL KNEE ARTHROPLASTY Left 09/24/2014   Procedure: TOTAL KNEE ARTHROPLASTY;  Surgeon: Melrose Nakayama, MD;  Location: [facility];  Service: Orthopedics;  Laterality: Left;    Allergies: Penicillins; Bee venom; Donepezil; and Methocarbamol  Medications: Prior to  Admission medications   Medication Sig Start Date End Date Taking? Authorizing Provider  acetaminophen (TYLENOL) 500 MG tablet Take 500-1,000 mg by mouth every 6 (six) hours as needed for mild pain.    [provider]  alfuzosin (UROXATRAL) 10 MG 24 hr tablet Take 10 mg by mouth daily.  06/30/17   [provider]  aspirin EC 81 MG tablet Take 1 tablet (81 mg total) by mouth daily. 03/17/17   Fay Records, MD  atorvastatin (LIPITOR) 40 MG tablet TAKE 1 TABLET(40 MG) BY MOUTH DAILY Patient taking differently: Take 40 mg by mouth at bedtime.  12/07/17   Fay Records,  MD  b complex vitamins tablet Take 1 tablet by mouth daily.    [provider]  cyclobenzaprine (FLEXERIL) 10 MG tablet Take 1 tablet (10 mg total) by mouth 2 (two) times daily as needed for muscle spasms. 01/08/18   Gareth Morgan, MD  Dextromethorphan-Guaifenesin (MUCINEX DM MAXIMUM STRENGTH) 60-1200 MG TB12 Take 1 tablet by mouth every 12 (twelve) hours as needed (congestion).     [provider]  EPINEPHrine (EPIPEN 2-PAK) 0.3 mg/0.3 mL IJ SOAJ injection Inject 0.3 mLs (0.3 mg total) into the muscle once. 11/25/14   Charlies Silvers, MD  finasteride (PROSCAR) 5 MG tablet Take 5 mg by mouth daily.    [provider]  levocetirizine (XYZAL) 5 MG tablet Take 5 mg by mouth daily.    [provider]  memantine (NAMENDA) 10 MG tablet Take 10 mg by mouth 2 (two) times daily.  08/17/17   [provider]  metoprolol tartrate (LOPRESSOR) 25 MG tablet TAKE 1/2 TABLET(12.5 MG) BY MOUTH TWICE DAILY Patient taking differently: Take 12.5 mg by mouth 2 (two) times daily.  12/07/17   Fay Records, MD  nitroGLYCERIN (NITROSTAT) 0.4 MG SL tablet Place 1 tablet (0.4 mg total) under the tongue every 5 (five) minutes x 3 doses as needed for chest pain. 11/09/17   Richardson Dopp T, PA-C  omeprazole (PRILOSEC) 40 MG capsule Take 40 mg by mouth 2 (two) times daily.  09/02/17   [provider]  traMADol (ULTRAM) 50 MG tablet Take 1 tablet (50 mg total) by mouth every 4 (four) hours as needed for moderate pain. 01/19/18   Charlynne Cousins, MD  venlafaxine XR (EFFEXOR-XR) 75 MG 24 hr capsule Take 75-150 capsules by mouth See admin instructions. Take 150 mg by mouth in the morning and 75 mg by mouth at bedtime 07/14/16   [provider]     Family History  Problem Relation Age of Onset  . Cancer Mother   . Cancer Brother   . Heart attack Neg Hx   . Stroke Neg Hx   . Hypertension Neg Hx     Social History   Socioeconomic History  . Marital status:  Married    Spouse name: Dixon Boos  . Number of children: 3  . Years of education: Not on file  . Highest education level: Not on file  Occupational History  . Not on file  Social Needs  . Financial resource strain: Not on file  . Food insecurity:    Worry: Not on file    Inability: Not on file  . Transportation needs:    Medical: Not on file    Non-medical: Not on file  Tobacco Use  . Smoking status: Former Smoker    Packs/day: 1.00    Years: 4.00    Pack years: 4.00  Types: Cigarettes    Last attempt to quit: 02/16/1960    Years since quitting: 57.9  . Smokeless tobacco: Never Used  Substance and Sexual Activity  . Alcohol use: No  . Drug use: No  . Sexual activity: Not on file  Lifestyle  . Physical activity:    Days per week: Not on file    Minutes per session: Not on file  . Stress: Not on file  Relationships  . Social connections:    Talks on phone: Not on file    Gets together: Not on file    Attends religious service: Not on file    Active member of club or organization: Not on file    Attends meetings of clubs or organizations: Not on file    Relationship status: Not on file  Other Topics Concern  . Not on file  Social History Narrative  . Not on file      Review of Systems denies fever,HA,CP,dyspnea, cough, abd pain,N/V or bleeding  Vital Signs: BP 114/70  HR 100 R 20  O2 SATS 97% RA  TEMP 97.9    Physical Exam awake/alert; chest- CTA bilat; heart- nl rate, ectopy noted; abd- soft,+BS,NT; trace bilat LE edema; mild- mod paravertebral tenderness L1 region  Imaging: Dg Chest 2 View  Result Date: 01/16/2018 CLINICAL DATA:  Chronic cough.  High blood pressure EXAM: CHEST - 2 VIEW COMPARISON:  01/08/2018 FINDINGS: Cardiac silhouette is borderline enlarged. No mediastinal or hilar masses. No evidence of adenopathy. There are irregularly thickened interstitial markings bilaterally, which predominate peripherally. This is stable. No lung consolidation to  suggest pneumonia. No evidence of pulmonary edema. No pleural effusion or pneumothorax. Skeletal structures are demineralized. Several mild compression deformities of the thoracic and upper lumbar spine. IMPRESSION: 1. No acute cardiopulmonary disease. 2. Chronic interstitial lung disease similar to the prior exam. Borderline cardiomegaly. Electronically Signed   By: Lajean Manes M.D.   On: 01/16/2018 16:52   Dg Chest 2 View  Result Date: 01/08/2018 CLINICAL DATA:  Cough. EXAM: CHEST - 2 VIEW COMPARISON:  11/29/2015 and prior radiographs FINDINGS: Cardiomegaly and chronic interstitial prominence/peribronchial thickening again noted. There is no evidence of focal airspace disease, pulmonary edema, suspicious pulmonary nodule/mass, pleural effusion, or pneumothorax. No acute bony abnormalities are identified. IMPRESSION: Cardiomegaly without evidence of acute cardiopulmonary disease. Mild chronic interstitial prominence/peribronchial thickening which may represent interstitial lung disease. Electronically Signed   By: Margarette Canada M.D.   On: 01/08/2018 10:41   Dg Lumbar Spine Complete  Result Date: 01/08/2018 CLINICAL DATA:  Lower back pain EXAM: LUMBAR SPINE - COMPLETE 4+ VIEW COMPARISON:  04/26/2016 FINDINGS: Normal alignment. Severe multilevel degenerative spondylosis throughout the visualized lower thoracic and lumbar spine at all levels. These changes are most severe at L2-3 and L4-5. Chronic compression fracture deformities at L2 and L4. Posterior facet arthropathy noted from L1-S1. Bones are osteopenic. No visualized pars defects. Normal SI joints for age. Bilateral hip arthroplasties noted. Normal bowel gas pattern.  Aorta atherosclerotic. IMPRESSION: Severe thoracolumbar degenerative spondylosis and remote L2 and L4 compression fractures. Osteopenia No significant interval change by plain radiography Abdominal aortic atherosclerosis Electronically Signed   By: Jerilynn Mages.  Shick M.D.   On: 01/08/2018 08:24     Mr Lumbar Spine Wo Contrast  Result Date: 01/17/2018 CLINICAL DATA:  Initial evaluation for acute back pain. EXAM: MRI LUMBAR SPINE WITHOUT CONTRAST TECHNIQUE: Multiplanar, multisequence MR imaging of the lumbar spine was performed. No intravenous contrast was administered. COMPARISON:  Prior MRI  from 04/26/2016. FINDINGS: Segmentation:  Examination moderately degraded by motion artifact. Normal segmentation. Lowest well-formed disc labeled the L5-S1 level. Alignment: Trace 2 mm anterolisthesis of L2 on L3 and L5 on S1. Alignment otherwise normal with preservation of the normal lumbar lordosis. No malalignment. Vertebrae: There is an acute burst type compression fracture extending through the superior and mid aspect of the L1 vertebral body with extension through both the anterior and middle columns. Associated height loss of up to 30% with trace 2 mm bony retropulsion. This is benign/mechanical in appearance with no underlying pathologic lesion. Additional chronic compression fractures involving the T11, L2, L3, and L4 vertebral bodies noted. Vertebral body heights otherwise maintained. Underlying bone marrow signal intensity within normal limits. No discrete or worrisome osseous lesions. No other abnormal marrow edema. Conus medullaris and cauda equina: Conus extends to the L1-2 level. Conus and cauda equina appear normal. Paraspinal and other soft tissues: Chronic postsurgical changes present within the lower posterior paraspinous soft tissues. Paraspinous soft tissues demonstrate no obvious abnormality on this motion degraded exam. Disc levels: T10-11: Diffuse disc bulge with disc desiccation and intervertebral disc space narrowing. Right greater than left facet hypertrophy. Mild spinal stenosis with right neural foraminal narrowing, stable from previous. T11-12: Mild annular disc bulge with disc desiccation. Mild right-sided facet hypertrophy. No significant spinal stenosis. Foramina remain patent.  T11-12: Disc bulge with tiny central disc protrusion minimally indenting the ventral thecal sac. Mild facet hypertrophy. No significant stenosis. L1-2: Mild diffuse disc bulge prior posterior decompression. Moderate facet hypertrophy. Resultant mild spinal stenosis, similar to previous. Foramina remain patent. L2-3: Trace anterolisthesis. Mild diffuse disc bulge, asymmetric to the left. Left greater than right reactive endplate changes with marginal endplate osteophytic spurring. Prior posterior decompression. Bilateral facet hypertrophy. No significant spinal stenosis. Moderate left L2 foraminal narrowing, stable. L3-4: Chronic intervertebral disc spacing with diffuse disc bulge and disc desiccation. Prior left hemi laminectomy. Moderate facet hypertrophy. Residual mild left lateral recess grossly stable. Moderate bilateral L3 foraminal narrowing, left greater than right, also unchanged. L4-5: Chronic intervertebral disc space narrowing with diffuse disc bulge and disc desiccation. Superimposed reactive endplate changes with endplate osteophytic spurring. Prior wide posterior decompression. Moderate to advanced facet hypertrophy. No significant residual spinal stenosis. Moderate right with moderate to severe left L4 foraminal narrowing related to disc osteophyte and facet disease, relatively stable from previous. L5-S1: Trace anterolisthesis. Intervertebral disc space narrowing with disc desiccation and broad posterior disc bulge. Moderate facet hypertrophy. Resultant moderate spinal stenosis with left greater than right lateral recess narrowing, stable from previous. Moderate left greater than right foraminal narrowing also unchanged. IMPRESSION: 1. Acute burst type compression fracture involving the L1 vertebral body with up to 30% height loss with trace 2 mm bony retropulsion. This is benign/mechanical in appearance. 2. Additional multilevel chronic compression deformities involving the T11, L2, L3, and L4  vertebral bodies, stable. 3. Multilevel degenerative spondylolysis and facet arthrosis with sequelae of prior posterior decompression as above, unchanged relative to previous MRI from 2018. Please see above report for a full description of these findings. Electronically Signed   By: Jeannine Boga M.D.   On: 01/17/2018 20:42   Ct Abdomen Pelvis W Contrast  Result Date: 01/14/2018 CLINICAL DATA:  Abdominal discomfort right lower quadrant 2 weeks. Recent falls. EXAM: CT ABDOMEN AND PELVIS WITH CONTRAST TECHNIQUE: Multidetector CT imaging of the abdomen and pelvis was performed using the standard protocol following bolus administration of intravenous contrast. CONTRAST:  127m ISOVUE-300 IOPAMIDOL (ISOVUE-300) INJECTION 61% COMPARISON:  01/10/2018 FINDINGS: Lower chest: Exam demonstrates bibasilar interstitial changes and mild bronchiectasis which is stable. No focal airspace consolidation or effusion. Mild cardiomegaly. Calcification of the mitral valve annulus. Calcified plaque over the right coronary artery. Hepatobiliary: Previous cholecystectomy. Liver and biliary tree are normal. Pancreas: Mild fatty atrophy of the pancreas which is otherwise within normal. Spleen: Normal. Adrenals/Urinary Tract: Adrenal glands are normal. Mild atrophy of the right kidney unchanged. Left kidney normal in size. No hydronephrosis or nephrolithiasis. Visualized ureters and bladder are normal. The distal ureters are obscured by streak artifact from bilateral hip prostheses. Stomach/Bowel: Stomach is normal. Small bowel is unremarkable. Previous appendectomy. Minimal diverticulosis of the colon. Vascular/Lymphatic: Moderate calcified plaque over the abdominal aorta. No adenopathy. Reproductive: Unremarkable. Other: No free fluid or focal inflammatory change. Musculoskeletal: Multiple compression fractures over the thoracolumbar spine unchanged. Bilateral total hip arthroplasties unchanged. IMPRESSION: No acute findings in  the abdomen/pelvis. Stable bibasilar interstitial changes with bronchiectasis. Mild colonic diverticulosis. Aortic Atherosclerosis (ICD10-I70.0). Cardiomegaly and atherosclerotic coronary artery disease. Multiple stable compression fractures over the thoracolumbar spine. Electronically Signed   By: Marin Olp M.D.   On: 01/14/2018 18:45   Ct Renal Stone Study  Result Date: 01/10/2018 CLINICAL DATA:  84 year old male with difficulty urinating and constipation for the past 3 days. Prior appendectomy. Initial encounter. EXAM: CT ABDOMEN AND PELVIS WITHOUT CONTRAST TECHNIQUE: Multidetector CT imaging of the abdomen and pelvis was performed following the standard protocol without IV contrast. COMPARISON:  05/14/2015 CT abdomen and pelvis. FINDINGS: Lower chest: Nonspecific chronic changes lung bases with minimal pleural thickening. Heart top-normal size. Mitral valve and right coronary artery calcifications. Prominent epicardial fat. Hepatobiliary: Taking into account limitation by non contrast imaging, no worrisome hepatic lesion. Post cholecystectomy. No calcified common bile duct stone. Pancreas: Taking into account limitation by non contrast imaging, no worrisome pancreatic lesion. Fatty infiltrated. Spleen: Taking into account limitation by non contrast imaging, no splenic mass or enlargement. Adrenals/Urinary Tract: No obstructing stone or hydronephrosis. Atrophic right kidney unchanged. Taking into account limitation by non contrast imaging, no worrisome renal or adrenal lesion. Evaluation of the urinary bladder is limited by streak artifact from hip replacements. The portion which is visualized appears unremarkable. Stomach/Bowel: Rectosigmoid region slightly limited by artifact from hip replacements. Mild sigmoid diverticulosis without evidence diverticulitis. There is a small area of fat necrosis superior to the urinary bladder and inferior to sigmoid colon but does not appear to be an acute  abnormality. Moderate stool throughout the colon. The cecum is displaced superiorly and medially. Under distended stomach limits evaluation.  No gross abnormality. Vascular/Lymphatic: Atherosclerotic changes aorta and aortic branch vessels. Focal ectasia infrarenal abdominal aorta measuring up to 2.5 cm versus prior 2.4 cm. Ectatic celiac artery measuring up to 1.3 cm versus prior 1.2 cm. Scattered normal size lymph nodes. Reproductive: Evaluation of prostate gland limited secondary to streak artifact from hip replacements. Other: No free air or bowel containing hernia. Fat containing inguinal hernias. Musculoskeletal: New L1 superior endplate mild compression fracture with 10% loss of height. Subchondral gas suggests that loss of height may progress with time. Loss of height/Schmorl's node deformities T11, L2 and L4 with degenerative changes throughout the lumbar spine similar to prior exam. Prior surgery lower lumbar region. Post bilateral hip replacements. Left acetabular screw partially enters the pelvis unchanged. IMPRESSION: 1. Mild sigmoid diverticulosis without evidence diverticulitis. 2. Moderate stool throughout the colon. The cecum is displaced superiorly and medially. 3. Evaluation of urinary bladder, rectosigmoid region and prostate gland limited by  streak artifact caused by hip replacements. 4. New L1 superior endplate mild compression fracture with 10% loss of height. Subchondral gas suggests that loss of height may progress with time. 5. Aortic Atherosclerosis (ICD10-I70.0). Focal ectasia infrarenal abdominal aorta measuring up to 2.5 cm versus prior 2.4 cm. Ectatic celiac artery measuring up to 1.3 cm versus prior 1.2 cm. Ectatic abdominal aorta at risk for aneurysm development. Recommend followup by ultrasound in 5 years. This recommendation follows ACR consensus guidelines: White Paper of the ACR Incidental Findings Committee II on Vascular Findings. J Am Coll Radiol 2013; 10:789-794. 6. No  obstructing stone or hydronephrosis. Slightly atrophic right kidney once again noted. Electronically Signed   By: Genia Del M.D.   On: 01/10/2018 14:39    Labs:  CBC: Recent Labs    01/10/18 1213 01/14/18 1550 01/14/18 2341 01/16/18 1107 01/23/18 0835  WBC 14.5* 15.1*  --  12.8* 11.9*  HGB 14.6 14.9  --  13.2 14.6  HCT 44.0 46.0 40.2 41.5 44.8  PLT 233 242  --  225 267    COAGS: Recent Labs    01/19/18 0923 01/23/18 0835  INR 1.03 0.96  APTT  --  26    BMP: Recent Labs    01/08/18 0917 01/10/18 1213 01/14/18 1550  NA 133* 137 137  K 3.7 3.7 3.9  CL 98 99 99  CO2 _0 GLUCOSE 93 133* 128*  BUN 24* 25* 16  CALCIUM 9.0 8.9 9.1  CREATININE 0.77 0.97 0.95  GFRNONAA >60 >60 >60  GFRAA >60 >60 >60    LIVER FUNCTION TESTS: Recent Labs    01/10/18 1213 01/14/18 1550  BILITOT 1.4* 0.9  AST 31 30  ALT 37 34  ALKPHOS 73 77  PROT 7.0 6.6  ALBUMIN 3.6 3.3*    TUMOR MARKERS: No results for input(s): AFPTM, CEA, CA199, CHROMGRNA in the last 8760 hours.  A/P: 84 y.o. male with PMH sig for obesity, OSA, CAD, afib, BPH, HTN, GERD, chronic thoraco-lumbar compression fractures and prior SAH after fall several years ago who presents now after another recent fall at home with persistent mid to low back pain. Subsequent imaging revealed acute burst type fracture of L1 with up to 30% height loss. He is scheduled today for L1 VP/KP. Imaging studies have been reviewed by Dr. Vernard Gambles.Risks and benefits of procedure were discussed with the patient/spouse including, but not limited to education regarding the natural healing process of compression fractures without intervention, bleeding, infection, cement migration which may cause spinal cord damage, paralysis, pulmonary embolism or even death.  This interventional procedure involves the use of X-rays and because of the nature of the planned procedure, it is possible that we will have prolonged use of X-ray  fluoroscopy.  Potential radiation risks to you include (but are not limited to) the following: - A slightly elevated risk for cancer  several years later in life. This risk is typically less than 0.5% percent. This risk is low in comparison to the normal incidence of human cancer, which is 33% for women and 50% for men according to the [employer]. - Radiation induced injury can include skin redness, resembling a rash, tissue breakdown / ulcers and hair loss (which can be temporary or permanent).   The likelihood of either of these occurring depends on the difficulty of the procedure and whether you are sensitive to radiation due to previous procedures, disease, or genetic conditions.   IF your procedure requires a prolonged use  of radiation, you will be notified and given written instructions for further action.  It is your responsibility to monitor the irradiated area for the 2 weeks following the procedure and to notify your physician if you are concerned that you have suffered a radiation induced injury.    All of the patient's questions were answered, patient is agreeable to proceed.  Consent signed and in chart.        Thank you for this interesting consult.  I greatly enjoyed meeting Larry Church and look forward to participating in their care.  A copy of this report was sent to the requesting provider on this date.  Electronically Signed: D. Rowe Robert, PA-C 01/23/2018, 9:06 AM   I spent a total of 30 minutes    in face to face in clinical consultation, greater than 50% of which was counseling/coordinating care for LI vertebral body augmentation

## 2018-01-23 NOTE — Discharge Instructions (Addendum)
Moderate Conscious Sedation, Adult, Care After These instructions provide you with information about caring for yourself after your procedure. Your health care provider may also give you more specific instructions. Your treatment has been planned according to current medical practices, but problems sometimes occur. Call your health care provider if you have any problems or questions after your procedure. What can I expect after the procedure? After your procedure, it is common:  To feel sleepy for several hours.  To feel clumsy and have poor balance for several hours.  To have poor judgment for several hours.  To vomit if you eat too soon.  Follow these instructions at home: For at least 24 hours after the procedure:   Do not: ? Participate in activities where you could fall or become injured. ? Drive. ? Use heavy machinery. ? Drink alcohol. ? Take sleeping pills or medicines that cause drowsiness. ? Make important decisions or sign legal documents. ? Take care of children on your own.  Rest. Eating and drinking  Follow the diet recommended by your health care provider.  If you vomit: ? Drink water, juice, or soup when you can drink without vomiting. ? Make sure you have little or no nausea before eating solid foods. General instructions  Have a responsible adult stay with you until you are awake and alert.  Take over-the-counter and prescription medicines only as told by your health care provider.  If you smoke, do not smoke without supervision.  Keep all follow-up visits as told by your health care provider. This is important. Contact a health care provider if:  You keep feeling nauseous or you keep vomiting.  You feel light-headed.  You develop a rash.  You have a fever. Get help right away if:  You have trouble breathing. This information is not intended to replace advice given to you by your health care provider. Make sure you discuss any questions you have  with your health care provider. Document Released: 11/22/2012 Document Revised: 07/07/2015 Document Reviewed: 05/24/2015 Elsevier Interactive Patient Education  2018 [place] INSTRUCTIONS  Medications:  Resume all home medications as before procedure.                     Continue your pain medications as prescribed as needed.  Over the next 3-5 days, decrease your pain medication as tolerated.  Over the counter medications (i.e. Tylenol, ibuprofen, and aleve) may be substituted once severe/moderate pain symptoms have subsided.   Wound Care: - Bandages may be removed the day following your procedure.  You may get your incision wet once bandages are removed.  Bandaids may be used to cover the incisions until scab formation.  Topical ointments are optional.  - If you develop a fever greater than 101 degrees, have increased skin redness at the incision sites or pus-like oozing from incisions occurring within 1 week of the procedure, contact radiology at [phone] or [phone].  - Ice pack to back for 15-20 minutes 2-3 time per day for first 2-3 days post procedure.  The ice will expedite muscle healing and help with the pain from the incisions.   Activity: - Bedrest today with limited activity for 24 hours post procedure.  - No driving for 48 hours.  - Increase your activity as tolerated after bedrest (with assistance if necessary).  - Refrain from any strenuous activity or heavy lifting (greater than 10 lbs.).   Follow up: - Contact radiology at [phone] or [phone] if any  questions/concerns.  - A physician assistant from radiology will contact you in approximately 1 week.  - If a biopsy was performed at the time of your procedure, your referring physician should receive the results in usually 2-3 days.

## 2018-01-30 ENCOUNTER — Ambulatory Visit: Payer: Medicare Other | Admitting: Internal Medicine

## 2018-02-04 ENCOUNTER — Encounter (HOSPITAL_BASED_OUTPATIENT_CLINIC_OR_DEPARTMENT_OTHER): Payer: Self-pay | Admitting: Emergency Medicine

## 2018-02-04 ENCOUNTER — Other Ambulatory Visit: Payer: Self-pay

## 2018-02-04 ENCOUNTER — Emergency Department (HOSPITAL_BASED_OUTPATIENT_CLINIC_OR_DEPARTMENT_OTHER)
Admission: EM | Admit: 2018-02-04 | Discharge: 2018-02-04 | Disposition: A | Discharge status: Home / Self Care | Payer: Medicare Other | Attending: Emergency Medicine | Admitting: Emergency Medicine

## 2018-02-04 DIAGNOSIS — M5410 Radiculopathy, site unspecified: Secondary | ICD-10-CM

## 2018-02-04 DIAGNOSIS — Z87891 Personal history of nicotine dependence: Secondary | ICD-10-CM | POA: Insufficient documentation

## 2018-02-04 DIAGNOSIS — Z79899 Other long term (current) drug therapy: Secondary | ICD-10-CM | POA: Diagnosis not present

## 2018-02-04 DIAGNOSIS — R1031 Right lower quadrant pain: Secondary | ICD-10-CM | POA: Diagnosis present

## 2018-02-04 LAB — COMPREHENSIVE METABOLIC PANEL
ALT: 28 U/L (ref 0–44)
AST: 31 U/L (ref 15–41)
Albumin: 3.2 g/dL — ABNORMAL LOW (ref 3.5–5.0)
Alkaline Phosphatase: 120 U/L (ref 38–126)
Anion gap: 6 (ref 5–15)
BUN: 19 mg/dL (ref 8–23)
CO2: 25 mmol/L (ref 22–32)
Calcium: 9.4 mg/dL (ref 8.9–10.3)
Chloride: 105 mmol/L (ref 98–111)
Creatinine, Ser: 0.95 mg/dL (ref 0.61–1.24)
GFR calc Af Amer: >60 mL/min (ref >60)
GFR calc non Af Amer: >60 mL/min (ref >60)
Glucose, Bld: 114 mg/dL — ABNORMAL HIGH (ref 70–99)
Potassium: 3.9 mmol/L (ref 3.5–5.1)
Sodium: 136 mmol/L (ref 135–145)
Total Bilirubin: 0.4 mg/dL (ref 0.3–1.2)
Total Protein: 6.9 g/dL (ref 6.5–8.1)

## 2018-02-04 LAB — CBC
HCT: 43.4 % (ref 39.0–52.0)
Hemoglobin: 13.7 g/dL (ref 13.0–17.0)
MCH: 32.4 pg (ref 26.0–34.0)
MCHC: 31.6 g/dL (ref 30.0–36.0)
MCV: 102.6 fL — ABNORMAL HIGH (ref 80.0–100.0)
Platelets: 390 10*3/uL (ref 150–400)
RBC: 4.23 MIL/uL (ref 4.22–5.81)
RDW: 15.1 % (ref 11.5–15.5)
WBC: 10.2 10*3/uL (ref 4.0–10.5)
nRBC: 0 % (ref 0.0–0.2)

## 2018-02-04 LAB — LIPASE, BLOOD: Lipase: 21 U/L (ref 11–51)

## 2018-02-04 MED ORDER — DEXAMETHASONE SODIUM PHOSPHATE 10 MG/ML IJ SOLN
10.0000 mg | Freq: Once | INTRAMUSCULAR | Status: AC
  Administered 2018-02-04: 10 mg via INTRAVENOUS
  Filled 2018-02-04: qty 1

## 2018-02-04 MED ORDER — SODIUM CHLORIDE 0.9 % IV BOLUS
500.0000 mL | Freq: Once | INTRAVENOUS | Status: AC
  Administered 2018-02-04: 500 mL via INTRAVENOUS

## 2018-02-04 MED ORDER — PREDNISONE 10 MG PO TABS: 40.0000 mg | ORAL_TABLET | Freq: Every day | ORAL | 0 refills | Status: AC

## 2018-02-04 NOTE — ED Provider Notes (Addendum)
[facility] [facility] EMERGENCY DEPARTMENT Provider Note   CSN: [id number] Arrival date & time: 02/04/18  1627     History   Chief Complaint Chief Complaint  Patient presents with  . Abdominal Pain    HPI Larry Church is a 84 y.o. male with a past medical history of CAD, degenerative disc disease, GERD who presents to ED for 1 month history of intermittent right lower quadrant abdominal pain that will radiate to his back.  Patient states that he had worsening of his pain after fall 1 month ago.  He has been seen and evaluated about 3-4 times for similar symptoms and has had improvement with the pain medications that we have given him.  However, he states that "the pain will just come right back."  He denies any other injuries.  He was started on gabapentin yesterday by his PCP.  States that pain is sharp, intermittent and with no specific aggravating or alleviating factor.  He does feel little bit better when he lays in a fetal position on his left side.  Reports decreased appetite but denies any nausea, vomiting, changes to bowel movements.  Denies any urinary symptoms, fever, shortness of breath.  HPI  Past Medical History:  Diagnosis Date  . Anxiety   . Arthritis    "bad; all over" (09/24/2014)  . Asthma   . Basal cell carcinoma    "cut and burned off  top of head and face"  . Chronic bronchitis ([place])    "got it q yr til this year" (09/24/2014)  . Chronic lower back pain   . Chronic sinusitis   . Coronary artery disease    LHC 10/15: pLAD 20-30, dLAD 50, origin D1 40; oLCx 40 then irregs; oRI 20, RCA diff irregs up to 10-20  . Degenerative arthritis   . Depression   . Echocardiograms    Echo 10/19: EF 55-60, no RWMA, mild AS (mean 14, peak 26), mild to mod AI, ascending aorta 40 mm, MAC, mild LAE, mild reduced RVSF, mild TR, PASP 43  . GERD (gastroesophageal reflux disease)   . Hypercholesterolemia   . Leg pain    ABIs 11/17: normal  . LV dysfunction    Echo 10/15: Mod  LVH, EF 40-50, no apical clot, aortic sclerosis, MAC, trivial MR, mild LAE  . Nuclear stress test    Nuclear stress test 10/19: EF 46, no ischemia; intermediate risk  . OSA on CPAP   . PVC's (premature ventricular contractions)    "I inherited a 4th skip from my mother"  . Seasonal allergies     Patient Active Problem List   Diagnosis Date Noted  . Fall with injury 01/19/2018  . Atrial fibrillation ([place]) 01/14/2018  . Abdominal pain, right lower quadrant 01/14/2018  . Mild cognitive impairment 01/14/2018  . BPH (benign prostatic hyperplasia) 01/14/2018  . Macrocytosis 01/14/2018  . Atrial fibrillation with rapid ventricular response ([place]) 01/14/2018  . Mild intermittent asthma 01/16/2015  . Obstructive sleep apnea treated with bilevel positive airway pressure (BiPAP) 01/16/2015  . Gastroesophageal reflux disease without esophagitis 01/16/2015  . Current use of beta blocker 01/16/2015  . Acute maxillary sinusitis 01/16/2015  . Acute sinusitis 01/06/2015  . Coughing 01/06/2015  . Acute bronchitis 01/06/2015  . Primary osteoarthritis of left knee 09/24/2014  . Primary osteoarthritis of knee 09/24/2014  . Traumatic subarachnoid hemorrhage ([place]) 02/20/2014  . Fall 02/19/2014  . CAD (coronary artery disease) 12/25/2013  . Cardiomyopathy, ischemic 12/25/2013  . Essential hypertension 12/25/2013  .  Morbid obesity ([place]) 12/25/2013  . Dyslipidemia 12/11/2013  . Unstable angina ([place]) 12/10/2013  . Fatigue 10/31/2013  . Screening for lipoid disorders 10/31/2013  . Obstructive apnea 10/31/2013  . Cervical pain 09/26/2013  . Beat, premature ventricular 09/13/2013  . Allergic rhinitis 09/10/2013  . Low back pain 09/10/2013  . 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 DDD (degenerative disc disease), lumbosacral 09/10/2013  . Acid reflux 09/10/2013  . History of colon polyps 09/10/2013  . Lumbar canal stenosis 09/10/2013  . Depression, major, recurrent ([place]) 09/10/2013  . Arthritis, degenerative  09/10/2013  . Atrophic kidney 09/10/2013  . Compressed spine fracture ([place]) 09/10/2013    Past Surgical History:  Procedure Laterality Date  . ANTERIOR CERVICAL DECOMP/DISCECTOMY FUSION  1998  . ANTERIOR CERVICAL DISCECTOMY  1989  . APPENDECTOMY  1967  . BACK SURGERY    . BASAL CELL CARCINOMA EXCISION     "top of my head"  . CARPAL TUNNEL RELEASE Bilateral 1980's  . CATARACT EXTRACTION W/ INTRAOCULAR LENS  IMPLANT, BILATERAL Bilateral 2013  . CHOLECYSTECTOMY    . COLONOSCOPY    . CORONARY ANGIOPLASTY WITH STENT PLACEMENT  ~ 1992  . EYE MUSCLE SURGERY Right 2013  . IR KYPHO LUMBAR INC FX REDUCE BONE BX UNI/BIL CANNULATION INC/IMAGING  01/23/2018  . JOINT REPLACEMENT    . LEFT HEART CATHETERIZATION WITH CORONARY ANGIOGRAM N/A 12/10/2013   Procedure: LEFT HEART CATHETERIZATION WITH CORONARY ANGIOGRAM;  Surgeon: Leonie Man, MD;  Location[facility];  Service: Cardiovascular;  Laterality: N/A;  . LUMBAR LAMINECTOMY/DECOMPRESSION MICRODISCECTOMY Bilateral 12/04/2012   Procedure: BILATERAL LUMBAR LAMINECTOMY/DECOMPRESSION MICRODISCECTOMY LUMBAR FOUR-TO FIVE SACRALONE;  Surgeon: Elaina Hoops, MD;  [facility];  Service: Neurosurgery;  Laterality: Bilateral;  . REVISION TOTAL HIP ARTHROPLASTY Right 2002   "changed a ball"  . SHOULDER ARTHROSCOPY Bilateral    bone spurs  . TONSILLECTOMY  ~ 1947  . TOTAL HIP ARTHROPLASTY Bilateral 1989-1995   "right-left"  . TOTAL KNEE ARTHROPLASTY Left 09/24/2014  . TOTAL KNEE ARTHROPLASTY Left 09/24/2014   Procedure: TOTAL KNEE ARTHROPLASTY;  Surgeon: Melrose Nakayama, MD;  Location: [facility];  Service: Orthopedics;  Laterality: Left;        Home Medications    Prior to Admission medications   Medication Sig Start Date End Date Taking? Authorizing Provider  acetaminophen (TYLENOL) 500 MG tablet Take 500-1,000 mg by mouth every 6 (six) hours as needed for mild pain.    [provider]  alfuzosin (UROXATRAL) 10 MG 24 hr tablet Take  10 mg by mouth daily.  06/30/17   [provider]  aspirin EC 81 MG tablet Take 1 tablet (81 mg total) by mouth daily. 03/17/17   Fay Records, MD  atorvastatin (LIPITOR) 40 MG tablet TAKE 1 TABLET(40 MG) BY MOUTH DAILY Patient taking differently: Take 40 mg by mouth at bedtime.  12/07/17   Fay Records, MD  b complex vitamins tablet Take 1 tablet by mouth daily.    [provider]  cyclobenzaprine (FLEXERIL) 10 MG tablet Take 1 tablet (10 mg total) by mouth 2 (two) times daily as needed for muscle spasms. 01/08/18   Gareth Morgan, MD  Dextromethorphan-Guaifenesin (MUCINEX DM MAXIMUM STRENGTH) 60-1200 MG TB12 Take 1 tablet by mouth every 12 (twelve) hours as needed (congestion).     [provider]  EPINEPHrine (EPIPEN 2-PAK) 0.3 mg/0.3 mL IJ SOAJ injection Inject 0.3 mLs (0.3 mg total) into the muscle once. 11/25/14   Charlies Silvers, MD  finasteride (PROSCAR) 5 MG tablet Take 5 mg by mouth daily.    [provider]  levocetirizine (XYZAL) 5 MG tablet Take 5 mg by mouth daily.    [provider]  memantine (NAMENDA) 10 MG tablet Take 10 mg by mouth 2 (two) times daily.  08/17/17   [provider]  metoprolol tartrate (LOPRESSOR) 25 MG tablet TAKE 1/2 TABLET(12.5 MG) BY MOUTH TWICE DAILY Patient taking differently: Take 12.5 mg by mouth 2 (two) times daily.  12/07/17   Fay Records, MD  nitroGLYCERIN (NITROSTAT) 0.4 MG SL tablet Place 1 tablet (0.4 mg total) under the tongue every 5 (five) minutes x 3 doses as needed for chest pain. 11/09/17   Richardson Dopp T, PA-C  omeprazole (PRILOSEC) 40 MG capsule Take 40 mg by mouth 2 (two) times daily.  09/02/17   [provider]  predniSONE (DELTASONE) 10 MG tablet Take 4 tablets (40 mg total) by mouth daily for 4 days. 02/04/18 02/08/18  Estephan Gallardo, Nicanor Alcon, PA-C  traMADol (ULTRAM) 50 MG tablet Take 1 tablet (50 mg total) by mouth every 4 (four) hours as needed for moderate pain. 01/19/18   Charlynne Cousins, MD  venlafaxine XR (EFFEXOR-XR) 75 MG 24 hr capsule Take 75-150 capsules by mouth See admin instructions. Take 150 mg by mouth in the morning and 75 mg by mouth at bedtime 07/14/16   [provider]    Family History Family History  Problem Relation Age of Onset  . Cancer Mother   . Cancer Brother   . Heart attack Neg Hx   . Stroke Neg Hx   . Hypertension Neg Hx     Social History Social History   Tobacco Use  . Smoking status: Former Smoker    Packs/day: 1.00    Years: 4.00    Pack years: 4.00    Types: Cigarettes    Last attempt to quit: 02/16/1960    Years since quitting: 58.0  . Smokeless tobacco: Never Used  Substance Use Topics  . Alcohol use: No  . Drug use: No     Allergies   Penicillins; Bee venom; Donepezil; and Methocarbamol   Review of Systems Review of Systems  Constitutional: Positive for appetite change. Negative for chills and fever.  HENT: Negative for ear pain, rhinorrhea, sneezing and sore throat.   Eyes: Negative for photophobia and visual disturbance.  Respiratory: Negative for cough, chest tightness, shortness of breath and wheezing.   Cardiovascular: Negative for chest pain and palpitations.  Gastrointestinal: Positive for abdominal pain. Negative for blood in stool, constipation, diarrhea, nausea and vomiting.  Genitourinary: Negative for dysuria, hematuria and urgency.  Musculoskeletal: Negative for myalgias.  Skin: Negative for rash.  Neurological: Negative for dizziness, weakness and light-headedness.     Physical Exam Updated Vital Signs BP 132/83 (BP Location: Right Arm)   Pulse (!) 59   Temp 98.5 F (36.9 C) (Oral)   Resp (!) 22   Ht 5' 5.5" (1.664 m)   Wt 113.4 kg   SpO2 93%   BMI 40.97 kg/m   Physical Exam Vitals signs and nursing note reviewed.  Constitutional:      General: He is not in acute distress.    Appearance: He is well-developed.  HENT:     Head: Normocephalic and atraumatic.     Nose:  Nose normal.  Eyes:     General: No scleral icterus.       Left eye: No discharge.     Conjunctiva/sclera:  Conjunctivae normal.  Neck:     Musculoskeletal: Normal range of motion and neck supple.  Cardiovascular:     Rate and Rhythm: Normal rate and regular rhythm.     Heart sounds: Normal heart sounds. No murmur. No friction rub. No gallop.   Pulmonary:     Effort: Pulmonary effort is normal. No respiratory distress.     Breath sounds: Normal breath sounds.  Abdominal:     General: Bowel sounds are normal. There is no distension.     Palpations: Abdomen is soft.     Tenderness: There is abdominal tenderness in the right lower quadrant. There is no guarding or rebound.  Musculoskeletal: Normal range of motion.  Skin:    General: Skin is warm and dry.     Findings: No rash.  Neurological:     Mental Status: He is alert.     Motor: No abnormal muscle tone.     Coordination: Coordination normal.      ED Treatments / Results  Labs (all labs ordered are listed, but only abnormal results are displayed) Labs Reviewed  COMPREHENSIVE METABOLIC PANEL - Abnormal; Notable for the following components:      Result Value   Glucose, Bld 114 (*)    Albumin 3.2 (*)    All other components within normal limits  CBC - Abnormal; Notable for the following components:   MCV 102.6 (*)    All other components within normal limits  LIPASE, BLOOD    EKG None  Radiology No results found.  Procedures Procedures (including critical care time)  Medications Ordered in ED Medications  dexamethasone (DECADRON) injection 10 mg (10 mg Intravenous Given 02/04/18 1807)     Initial Impression / Assessment and Plan / ED Course  I have reviewed the triage vital signs and the nursing notes.  Pertinent labs & imaging results that were available during my care of the patient were reviewed by me and considered in my medical decision making (see chart for details).     84 year old male presents  to ED for ongoing right lower quadrant pain associated with a fall 1 month ago.  This was his fourth visit for similar symptoms.  His work-up is significant for negative CT of the abdomen pelvis x2, MRI of the lumbar spine showing new burst fracture.  This was treated with IR.  He was started on gabapentin yesterday.  He denies any new symptoms but states that symptoms have also not improved.  Denies any changes to bowel movements, fever, additional injuries, nausea, vomiting.  On exam there is tenderness to palpation of the right lower quadrant without rebound or guarding.  No deficits on neurological exam noted.  I reviewed work-up and imaging from prior visits.  At this point his symptoms could very well be radicular.  His repeat labs today are unremarkable.  His vital signs are within normal limits.  Will give Decadron and p.o. steroids as well.  Will advise him to return to ED for any severe worsening symptoms.  He was given a referral to gastroenterology. Patient discussed with and seen by my attending, Dr. Gilford Raid.  Patient is hemodynamically stable, in NAD, and able to ambulate in the ED. Evaluation does not show pathology that would require ongoing emergent intervention or inpatient treatment. I explained the diagnosis to the patient. Pain has been managed and has no complaints prior to discharge. Patient is comfortable with above plan and is stable for discharge at this time. All questions were answered prior  to disposition. Strict return precautions for returning to the ED were discussed. Encouraged follow up with PCP.    Portions of this note were generated with [employer]. Dictation errors may occur despite best attempts at proofreading.   Final Clinical Impressions(s) / ED Diagnoses   Final diagnoses:  Radiculopathy, unspecified spinal region    ED Discharge Orders         Ordered    Ambulatory referral to Gastroenterology     02/04/18 1751    predniSONE (DELTASONE)  10 MG tablet  Daily     02/04/18 1828           Delia Heady, PA-C 02/04/18 1833    7:22 PM After Decadron was administered, patient became hypotensive to 91/41.  He was given a 500 cc bolus with significant improvement in his feelings of lightheadedness.  Blood pressure improved to 150/86.  He was able to ambulate without difficulty.  He is comfortable with discharge home.   Delia Heady, PA-C 02/04/18 Oris Drone, MD 02/04/18 2350

## 2018-02-04 NOTE — ED Notes (Signed)
ED Provider at bedside. 

## 2018-02-04 NOTE — ED Triage Notes (Signed)
Patient states that he has pain to his right lower quadrant and radiates to his back. The patient denies any N/V  - the patient reports that his pain has become worse sicne he fell about 1 month ago

## 2018-02-04 NOTE — Discharge Instructions (Signed)
We have sent a referral to the gastroenterologist. Take the steroids as directed beginning tomorrow. Return to ED for worsening symptoms, fever, chest pain, shortness of breath, blood in stool.

## 2018-02-04 NOTE — ED Notes (Signed)
Pt ambulated without difficulty

## 2018-03-27 ENCOUNTER — Encounter: Payer: Self-pay | Admitting: Emergency Medicine

## 2018-06-09 ENCOUNTER — Telehealth (INDEPENDENT_AMBULATORY_CARE_PROVIDER_SITE_OTHER): Payer: Medicare Other | Admitting: Internal Medicine

## 2018-06-09 ENCOUNTER — Encounter: Payer: Self-pay | Admitting: Internal Medicine

## 2018-06-09 ENCOUNTER — Telehealth: Payer: Self-pay | Admitting: Internal Medicine

## 2018-06-09 VITALS — Ht 65.0 in

## 2018-06-09 DIAGNOSIS — I480 Paroxysmal atrial fibrillation: Secondary | ICD-10-CM | POA: Diagnosis not present

## 2018-06-09 NOTE — Patient Instructions (Signed)
Medication Instructions:  No changes If you need a refill on your cardiac medications before your next appointment, please call your pharmacy.   Lab work: none If you have labs (blood work) drawn today and your tests are completely normal, you will receive your results only by: . MyChart Message (if you have MyChart) OR . A paper copy in the mail If you have any lab test that is abnormal or we need to change your treatment, we will call you to review the results.  Testing/Procedures: none  Follow-Up: At [facility], you and your health needs are our priority.  As part of our continuing mission to provide you with exceptional heart care, we have created designated Provider Care Teams.  These Care Teams include your primary Cardiologist (physician) and Advanced Practice Providers (APPs -  Physician Assistants and Nurse Practitioners) who all work together to provide you with the care you need, when you need it. You will need a follow up appointment in:  6 months.  Please call our office 2 months in advance to schedule this appointment.  You may see Paula Ross, MD or one of the following Advanced Practice Providers on your designated Care Team: Scott Weaver, PA-C Vin Bhagat, PA-C . Janine Hammond, NP  Any Other Special Instructions Will Be Listed Below (If Applicable).     

## 2018-06-09 NOTE — Progress Notes (Signed)
Virtual Visit via Video Note   This visit type was conducted due to national recommendations for restrictions regarding the COVID-19 Pandemic (e.g. social distancing) in an effort to limit this patient's exposure and mitigate transmission in our community.  Due to his co-morbid illnesses, this patient is at least at moderate risk for complications without adequate follow up.  This format is felt to be most appropriate for this patient at this time.  All issues noted in this document were discussed and addressed.  A limited physical exam was performed with this format.  Please refer to the patient's chart for his consent to telehealth for [facility].   Evaluation Performed:  Follow-up visit  Date:  06/09/2018   ID:  Larry, Church 06-23-1932, MRN [id number]  Patient Location: Home Provider Location: Home  PCP:  Lilian Coma., MD  Cardiologist:  Dorris Carnes, MD Electrophysiologist:  None   Chief Complaint:  F/U of CAD  History of Present Illness:    Larry Church is a 85 y.o. male with hx of CAD ( Mild to mod CAD of LCx and RCA and LAD).  Echo 2015 LVEF 45 to 50%  Hx PVsCs, HL, OSA, morbid obesitiy, Hx SAH,  LHC in 11/2013 showed ononobstructive CAD  The patient had some afib when hospitalized in Dec 2019   Was not felt to be anticoag candidate due to falls and Hx SAH    I saw him in clinic in Jan 2019   He saw [name] in September 2019  Feet swell a little L greater R (arthitis in knee on L)  Denies CP   Breathing OK  No racing of heart   Does not check BP  The patient  does not have symptoms concerning for COVID-19 infection (fever, chills, cough, or new shortness of breath).    Past Medical History:  Diagnosis Date  . Anxiety   . Arthritis    "bad; all over" (09/24/2014)  . Asthma   . Basal cell carcinoma    "cut and burned off  top of head and face"  . Chronic bronchitis ([place])    "got it q yr til this year" (09/24/2014)  . Chronic lower back pain   . Chronic  sinusitis   . Coronary artery disease    LHC 10/15: pLAD 20-30, dLAD 50, origin D1 40; oLCx 40 then irregs; oRI 20, RCA diff irregs up to 10-20  . Degenerative arthritis   . Depression   . Echocardiograms    Echo 10/19: EF 55-60, no RWMA, mild AS (mean 14, peak 26), mild to mod AI, ascending aorta 40 mm, MAC, mild LAE, mild reduced RVSF, mild TR, PASP 43  . GERD (gastroesophageal reflux disease)   . Hypercholesterolemia   . Leg pain    ABIs 11/17: normal  . LV dysfunction    Echo 10/15: Mod LVH, EF 40-50, no apical clot, aortic sclerosis, MAC, trivial MR, mild LAE  . Nuclear stress test    Nuclear stress test 10/19: EF 46, no ischemia; intermediate risk  . OSA on CPAP   . PVC's (premature ventricular contractions)    "I inherited a 4th skip from my mother"  . Seasonal allergies    Past Surgical History:  Procedure Laterality Date  . ANTERIOR CERVICAL DECOMP/DISCECTOMY FUSION  1998  . ANTERIOR CERVICAL DISCECTOMY  1989  . APPENDECTOMY  1967  . BACK SURGERY    . BASAL CELL CARCINOMA EXCISION     "top of  my head"  . CARPAL TUNNEL RELEASE Bilateral 1980's  . CATARACT EXTRACTION W/ INTRAOCULAR LENS  IMPLANT, BILATERAL Bilateral 2013  . CHOLECYSTECTOMY    . COLONOSCOPY    . CORONARY ANGIOPLASTY WITH STENT PLACEMENT  ~ 1992  . EYE MUSCLE SURGERY Right 2013  . IR KYPHO LUMBAR INC FX REDUCE BONE BX UNI/BIL CANNULATION INC/IMAGING  01/23/2018  . JOINT REPLACEMENT    . LEFT HEART CATHETERIZATION WITH CORONARY ANGIOGRAM N/A 12/10/2013   Procedure: LEFT HEART CATHETERIZATION WITH CORONARY ANGIOGRAM;  Surgeon: Leonie Man, MD;  Location: [facility] CATH LAB;  Service: Cardiovascular;  Laterality: N/A;  . LUMBAR LAMINECTOMY/DECOMPRESSION MICRODISCECTOMY Bilateral 12/04/2012   Procedure: BILATERAL LUMBAR LAMINECTOMY/DECOMPRESSION MICRODISCECTOMY LUMBAR FOUR-TO FIVE SACRALONE;  Surgeon: Elaina Hoops, MD;  Location: [facility];  Service: Neurosurgery;  Laterality: Bilateral;  . REVISION TOTAL HIP  ARTHROPLASTY Right 2002   "changed a ball"  . SHOULDER ARTHROSCOPY Bilateral    bone spurs  . TONSILLECTOMY  ~ 1947  . TOTAL HIP ARTHROPLASTY Bilateral 1989-1995   "right-left"  . TOTAL KNEE ARTHROPLASTY Left 09/24/2014  . TOTAL KNEE ARTHROPLASTY Left 09/24/2014   Procedure: TOTAL KNEE ARTHROPLASTY;  Surgeon: Melrose Nakayama, MD;  Location: [facility];  Service: Orthopedics;  Laterality: Left;     Current Meds  Medication Sig  . acetaminophen (TYLENOL) 500 MG tablet Take 500-1,000 mg by mouth every 6 (six) hours as needed for mild pain.  Marland Kitchen alfuzosin (UROXATRAL) 10 MG 24 hr tablet Take 10 mg by mouth daily.   Marland Kitchen aspirin EC 81 MG tablet Take 1 tablet (81 mg total) by mouth daily.  Marland Kitchen atorvastatin (LIPITOR) 40 MG tablet TAKE 1 TABLET(40 MG) BY MOUTH DAILY (Patient taking differently: Take 40 mg by mouth at bedtime. )  . Dextromethorphan-Guaifenesin (MUCINEX DM MAXIMUM STRENGTH) 60-1200 MG TB12 Take 1 tablet by mouth every 12 (twelve) hours as needed (congestion).   Marland Kitchen EPINEPHrine (EPIPEN 2-PAK) 0.3 mg/0.3 mL IJ SOAJ injection Inject 0.3 mLs (0.3 mg total) into the muscle once.  . finasteride (PROSCAR) 5 MG tablet Take 5 mg by mouth daily.  Marland Kitchen gabapentin (NEURONTIN) 100 MG capsule Take 1 capsule by mouth 3 (three) times daily.  Marland Kitchen levocetirizine (XYZAL) 5 MG tablet Take 5 mg by mouth daily.  . memantine (NAMENDA) 10 MG tablet Take 10 mg by mouth 2 (two) times daily.   . metoprolol tartrate (LOPRESSOR) 25 MG tablet TAKE 1/2 TABLET(12.5 MG) BY MOUTH TWICE DAILY (Patient taking differently: Take 12.5 mg by mouth 2 (two) times daily. )  . nitroGLYCERIN (NITROSTAT) 0.4 MG SL tablet Place 1 tablet (0.4 mg total) under the tongue every 5 (five) minutes x 3 doses as needed for chest pain.  Marland Kitchen omeprazole (PRILOSEC) 40 MG capsule Take 40 mg by mouth 2 (two) times daily.   . traMADol (ULTRAM) 50 MG tablet Take 1 tablet (50 mg total) by mouth every 4 (four) hours as needed for moderate pain.  Marland Kitchen venlafaxine XR  (EFFEXOR-XR) 75 MG 24 hr capsule Take 75-150 capsules by mouth See admin instructions. Take 150 mg by mouth in the morning and 75 mg by mouth at bedtime     Allergies:   Penicillins; Bee venom; Donepezil; and Methocarbamol   Social History   Tobacco Use  . Smoking status: Former Smoker    Packs/day: 1.00    Years: 4.00    Pack years: 4.00    Types: Cigarettes    Last attempt to quit: 02/16/1960    Years since quitting:  58.3  . Smokeless tobacco: Never Used  Substance Use Topics  . Alcohol use: No  . Drug use: No     Family Hx: The patient's family history includes Cancer in his brother and mother. There is no history of Heart attack, Stroke, or Hypertension.  ROS:   Please see the history of present illness.     All other systems reviewed and are negative.   Prior CV studies:   The following studies were reviewed today: Reviewed cath 2015   Echo 2015  Labs/Other Tests and Data Reviewed:    EKG:  Not done as tele visit  Recent Labs: 01/08/2018: B Natriuretic Peptide 448.6 01/17/2018: Magnesium 2.2 02/04/2018: ALT 28; BUN 19; Creatinine, Ser 0.95; Hemoglobin 13.7; Platelets 390; Potassium 3.9; Sodium 136   Recent Lipid Panel Lab Results  Component Value Date/Time   CHOL 117 03/18/2016 08:52 AM   TRIG 56 03/18/2016 08:52 AM   HDL 58 03/18/2016 08:52 AM   CHOLHDL 2.0 03/18/2016 08:52 AM   CHOLHDL 3 02/05/2014 08:43 AM   LDLCALC 48 03/18/2016 08:52 AM    Wt Readings from Last 3 Encounters:  02/04/18 250 lb (113.4 kg)  01/23/18 220 lb (99.8 kg)  01/14/18 250 lb (113.4 kg)     Objective:    Vital Signs:  Ht _0  (1.651 m)   BMI 41.60 kg/m      ASSESSMENT & PLAN:    1. CAD  No symptoms of angina     2  Atrial fibrillatoin   PAF   Pt had when hosp in December   Because of fall risk and hx SAH will not Rx anticoagulatoin  3   HL  Continue lipitor   Will check lipids in fall  3  Hx of HTN   Encouraged him to get BP cuff   He does not check BP  4  Hx  OSA  Now on CPAP with O2 at night    COVID-19 Education: The signs and symptoms of COVID-19 were discussed with the patient and how to seek care for testing (follow up with PCP or arrange E-visit).  The importance of social distancing was discussed today.  Time:   Today, I have spent 25 minutes with the patient with telehealth technology discussing the above problems.     Medication Adjustments/Labs and Tests Ordered: Current medicines are reviewed at length with the patient today.  Concerns regarding medicines are outlined above.   Tests Ordered: No orders of the defined types were placed in this encounter.   Medication Changes: No orders of the defined types were placed in this encounter.   Disposition:  Follow up 6 months  Will check labs at that time  Signed, Dorris Carnes, MD  06/09/2018 3:26 PM    [place]

## 2018-06-09 NOTE — Telephone Encounter (Signed)
Virtual Visit Pre-Appointment Phone Call  "(Name), I am calling you today to discuss your upcoming appointment. We are currently trying to limit exposure to the virus that causes COVID-19 by seeing patients at home rather than in the office."  1. "What is the BEST phone number to call the day of the visit?" - include this in appointment notes  2. "Do you have or have access to (through a family member/friend) a smartphone with video capability that we can use for your visit?" a. If yes - list this number in appt notes as "cell" (if different from BEST phone #) and list the appointment type as a VIDEO visit in appointment notes b. If no - list the appointment type as a PHONE visit in appointment notes  3. Confirm consent - "In the setting of the current Covid19 crisis, you are scheduled for a (video) visit with your provider on 06/09/18 at 3:00 pm.  Just as we do with many in-office visits, in order for you to participate in this visit, we must obtain consent.  If you'd like, I can send this to your mychart (if signed up) or email for you to review.  Otherwise, I can obtain your verbal consent now.  All virtual visits are billed to your insurance company just like a normal visit would be.  By agreeing to a virtual visit, we'd like you to understand that the technology does not allow for your provider to perform an examination, and thus may limit your provider's ability to fully assess your condition. If your provider identifies any concerns that need to be evaluated in person, we will make arrangements to do so.  Finally, though the technology is pretty good, we cannot assure that it will always work on either your or our end, and in the setting of a video visit, we may have to convert it to a phone-only visit.  In either situation, we cannot ensure that we have a secure connection.  Are you willing to proceed?" STAFF: Did the patient verbally acknowledge consent to telehealth visit? Document YES/NO  here: YES  4. Advise patient to be prepared - "Two hours prior to your appointment, go ahead and check your blood pressure, pulse, oxygen saturation, and your weight (if you have the equipment to check those) and write them all down. When your visit starts, your provider will ask you for this information. If you have an Apple Watch or Kardia device, please plan to have heart rate information ready on the day of your appointment. Please have a pen and paper handy nearby the day of the visit as well."  5. Give patient instructions for MyChart download to smartphone OR [email or web address] as below if video visit (depending on what platform provider is using)  6. Inform patient they will receive a phone call 15 minutes prior to their appointment time (may be from unknown caller ID) so they should be prepared to answer    TELEPHONE CALL NOTE  Larry Church has been deemed a candidate for a follow-up tele-health visit to limit community exposure during the Covid-19 pandemic. I spoke with the patient via phone to ensure availability of phone/video source, confirm preferred email & phone number, and discuss instructions and expectations.  I reminded Larry Church to be prepared with any vital sign and/or heart rhythm information that could potentially be obtained via home monitoring, at the time of his visit. I reminded Larry Church to expect a phone call prior to his  visit.  Larry Key, RN 06/09/2018 2:13 PM   INSTRUCTIONS FOR DOWNLOADING THE MYCHART APP TO SMARTPHONE  - The patient must first make sure to have activated MyChart and know their login information - If Apple, go to [employer] and type in MyChart in the search bar and download the app. If Android, ask patient to go to [employer] and type in [place] in the search bar and download the app. The app is free but as with any other app downloads, their phone may require them to verify saved payment information or Apple/Android  password.  - The patient will need to then log into the app with their MyChart username and password, and select [facility] as their healthcare provider to link the account. When it is time for your visit, go to the MyChart app, find appointments, and click Begin Video Visit. Be sure to Select Allow for your device to access the Microphone and Camera for your visit. You will then be connected, and your provider will be with you shortly.  **If they have any issues connecting, or need assistance please contact MyChart service desk [phone])**  **If using a computer, in order to ensure the best quality for their visit they will need to use either of the following Internet Browsers: [employer], or Google Chrome**  IF USING DOXIMITY or DOXY.ME - The patient will receive a link just prior to their visit by text.     FULL LENGTH CONSENT FOR TELE-HEALTH VISIT   I hereby voluntarily request, consent and authorize [facility] and its employed or contracted physicians, physician assistants, nurse practitioners or other licensed health care professionals (the Practitioner), to provide me with telemedicine health care services (the "Services") as deemed necessary by the treating Practitioner. I acknowledge and consent to receive the Services by the Practitioner via telemedicine. I understand that the telemedicine visit will involve communicating with the Practitioner through live audiovisual communication technology and the disclosure of certain medical information by electronic transmission. I acknowledge that I have been given the opportunity to request an in-person assessment or other available alternative prior to the telemedicine visit and am voluntarily participating in the telemedicine visit.  I understand that I have the right to withhold or withdraw my consent to the use of telemedicine in the course of my care at any time, without affecting my right to future care or treatment,  and that the Practitioner or I may terminate the telemedicine visit at any time. I understand that I have the right to inspect all information obtained and/or recorded in the course of the telemedicine visit and may receive copies of available information for a reasonable fee.  I understand that some of the potential risks of receiving the Services via telemedicine include:  Marland Kitchen Delay or interruption in medical evaluation due to technological equipment failure or disruption; . Information transmitted may not be sufficient (e.g. poor resolution of images) to allow for appropriate medical decision making by the Practitioner; and/or  . In rare instances, security protocols could fail, causing a breach of personal health information.  Furthermore, I acknowledge that it is my responsibility to provide information about my medical history, conditions and care that is complete and accurate to the best of my ability. I acknowledge that Practitioner's advice, recommendations, and/or decision may be based on factors not within their control, such as incomplete or inaccurate data provided by me or distortions of diagnostic images or specimens that may result from electronic transmissions. I understand  that the practice of medicine is not an exact science and that Practitioner makes no warranties or guarantees regarding treatment outcomes. I acknowledge that I will receive a copy of this consent concurrently upon execution via email to the email address I last provided but may also request a printed copy by calling the office of [facility].    I understand that my insurance will be billed for this visit.   I have read or had this consent read to me. . I understand the contents of this consent, which adequately explains the benefits and risks of the Services being provided via telemedicine.  . I have been provided ample opportunity to ask questions regarding this consent and the Services and have had my questions  answered to my satisfaction. . I give my informed consent for the services to be provided through the use of telemedicine in my medical care  By participating in this telemedicine visit I agree to the above.

## 2018-06-09 NOTE — Telephone Encounter (Signed)
° °  TELEPHONE CALL NOTE  This patient has been deemed a candidate for follow-up tele-health visit to limit community exposure during the Covid-19 pandemic. I spoke with the patient via phone to discuss instructions. This has been outlined on the patient's AVS (dotphrase: [email or web address]). The patient was advised to review the section on consent for treatment as well. The patient will receive a phone call 2-3 days prior to their E-Visit at which time consent will be verbally confirmed.   A Virtual Office Visit appointment type has been scheduled for 06/12/2018 with Dr. Harrington Challenger, with "VIDEO" or "TELEPHONE" in the appointment notes - patient prefers either  type.  I have either confirmed the patient is active in MyChart or offered to send sign-up link to phone/email via Mychart icon beside patient's photo.  Minus Liberty 06/09/2018 2:07 PM

## 2018-06-09 NOTE — Telephone Encounter (Signed)
Pt. Gave verbal consent for virtual visit. Completed a test call for doximity.. successful with just a minor delay. He almost said he didn't want to do virtual but his wife said she will make sure he gets connected.

## 2018-08-07 ENCOUNTER — Telehealth: Payer: Self-pay | Admitting: Internal Medicine

## 2018-08-07 NOTE — Telephone Encounter (Signed)
New message   Patient's wife is calling to see if Dr. Harrington Challenger office received a copy of Ekg.  Please advise.

## 2018-08-07 NOTE — Telephone Encounter (Signed)
Received ekg and pt is in afib Per Dr Harrington Challenger pt was scheduled 08/14/18 with Dr Harrington Challenger  a virtual visit and pt is waniting  a real person visit  appt made with Richardson Dopp [facility] for Friday ./cy
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
## 2018-08-09 ENCOUNTER — Encounter: Payer: Self-pay | Admitting: Physician Assistant

## 2018-08-09 ENCOUNTER — Telehealth: Payer: Self-pay | Admitting: Physician Assistant

## 2018-08-09 ENCOUNTER — Other Ambulatory Visit: Payer: Self-pay

## 2018-08-09 ENCOUNTER — Ambulatory Visit (INDEPENDENT_AMBULATORY_CARE_PROVIDER_SITE_OTHER): Payer: Medicare Other | Admitting: Physician Assistant

## 2018-08-09 ENCOUNTER — Telehealth: Payer: Self-pay | Admitting: *Deleted

## 2018-08-09 VITALS — BP 114/70 | HR 53 | Ht 65.5 in | Wt 237.0 lb

## 2018-08-09 DIAGNOSIS — I480 Paroxysmal atrial fibrillation: Secondary | ICD-10-CM | POA: Diagnosis not present

## 2018-08-09 DIAGNOSIS — I493 Ventricular premature depolarization: Secondary | ICD-10-CM

## 2018-08-09 DIAGNOSIS — E782 Mixed hyperlipidemia: Secondary | ICD-10-CM | POA: Diagnosis not present

## 2018-08-09 DIAGNOSIS — I2510 Atherosclerotic heart disease of native coronary artery without angina pectoris: Secondary | ICD-10-CM | POA: Diagnosis not present

## 2018-08-09 DIAGNOSIS — I10 Essential (primary) hypertension: Secondary | ICD-10-CM | POA: Diagnosis not present

## 2018-08-09 MED ORDER — METOPROLOL TARTRATE 25 MG PO TABS: 12.5000 mg | ORAL_TABLET | Freq: Every day | ORAL | 3 refills | Status: DC

## 2018-08-09 NOTE — Patient Instructions (Signed)
Medication Instructions:   Start taking Metoprolol  12.5 mg once a day.    If you need a refill on your cardiac medications before your next appointment, please call your pharmacy.   Lab work:  NONE ORDERED  TODAY   If you have labs (blood work) drawn today and your tests are completely normal, you will receive your results only by: Marland Kitchen MyChart Message (if you have MyChart) OR . A paper copy in the mail If you have any lab test that is abnormal or we need to change your treatment, we will call you to review the results.  Testing/Procedures: Your physician has recommended that you wear a holter monitor. Holter monitors are medical devices that record the heart's electrical activity. Doctors most often use these monitors to diagnose arrhythmias. Arrhythmias are problems with the speed or rhythm of the heartbeat. The monitor is a small, portable device. You can wear one while you do your normal daily activities. This is usually used to diagnose what is causing palpitations/syncope (passing out).   Follow-Up: At [facility], you and your health needs are our priority.  As part of our continuing mission to provide you with exceptional heart care, we have created designated Provider Care Teams.  These Care Teams include your primary Cardiologist (physician) and Advanced Practice Providers (APPs -  Physician Assistants and Nurse Practitioners) who all work together to provide you with the care you need, when you need it. You will need a follow up appointment in:  3 months.  You may see Dorris Carnes, MD  or one of the following Advanced Practice Providers on your designated Care Team: Richardson Dopp, PA-C [place], [place] . Daune Perch, NP  Any Other Special Instructions Will Be Listed Below (If Applicable).

## 2018-08-09 NOTE — Telephone Encounter (Signed)
3 day ZIO XT long term holter monitor to be mailed to patients home.  Explained to patient, and also to his wife, the monitor was ordered for 24 hours.  He needs to wear the monitor at least 24 hour but may wear it up to 3 days.  Instructions reviewed as they are included in the monitor kit.

## 2018-08-09 NOTE — Telephone Encounter (Signed)
New message   Patient's wife has questions about the office visit for today. Please call to discuss the patient's wife states that the patient has dimentia.

## 2018-08-10 ENCOUNTER — Ambulatory Visit: Payer: Medicare Other | Admitting: Internal Medicine

## 2018-08-11 ENCOUNTER — Ambulatory Visit: Payer: Medicare Other | Admitting: Physician Assistant

## 2018-08-14 ENCOUNTER — Telehealth: Payer: Medicare Other | Admitting: Internal Medicine

## 2018-08-14 NOTE — Progress Notes (Signed)
I see he has fallen at least once this year. Larry Church is very aggressive about anticoagulation.   I think keep reassessing   If hasnt hit head consider eliquis Tough call

## 2018-08-15 ENCOUNTER — Ambulatory Visit (INDEPENDENT_AMBULATORY_CARE_PROVIDER_SITE_OTHER): Payer: Medicare Other

## 2018-08-15 DIAGNOSIS — I480 Paroxysmal atrial fibrillation: Secondary | ICD-10-CM | POA: Diagnosis not present

## 2018-08-29 ENCOUNTER — Telehealth: Payer: Self-pay | Admitting: Internal Medicine

## 2018-08-29 ENCOUNTER — Other Ambulatory Visit: Payer: Self-pay

## 2018-08-29 DIAGNOSIS — Z01812 Encounter for preprocedural laboratory examination: Secondary | ICD-10-CM

## 2018-08-29 NOTE — Telephone Encounter (Signed)
Pts wife is calling to report to Dr Harrington Challenger that the pts PCP Dr. Gerarda Fraction sent the pt to have an echo done today, due to a recent abnormal EKG. Dr. Gerarda Fraction sent the pt to have the echo done at [facility], for they allowed the pts wife to come back during the test.  Wife states that the pt was to have the echo done at our office, but being we allow no visitors, the wife took it upon herself to cancel that appt, and have the PCP order for the pt to have this done at a place she could go with the pt.  Now wife is calling to report that Dr. Ernie Avena called and said the pts echo is abnormal, and he may need to have some kind of intervention done. Wife states that Dr.Maldonado said the pts echo showed 3 concerning abnormalities, but all she can remember from the conversation is he said his valves appeared to look very abnormal.  Wife states that Dr. Gerarda Fraction considered admitting the pt, but he did not, for she states the he said the pt was asymptomatic. Wife states she is having them fax the report to Dr Harrington Challenger tomorrow at [phone], being this is outside of [facility].  Wife would like for Dr Harrington Challenger and her RN to call tomorrow concerning this, as well as review the echo, for they are really concerned for the pt.  Noted in the pts chart, that Dr. Harrington Challenger may be able to get the pts echo through care everywhere.  Informed the pts wife that I will route this communication to both Dr. Harrington Challenger and Caren Hazy RN for review and follow-up of this matter, upon return to the office tomorrow.  Wife verbalized understanding and agrees with this plan.

## 2018-08-29 NOTE — Telephone Encounter (Signed)
Follow Up    Phone number update [phone] or [phone]

## 2018-08-29 NOTE — Telephone Encounter (Deleted)
Pts wife called to report that the pt just got home from having an Echo with Dr.Guzman in [facility] ordered by Dr. Gerarda Fraction... they were told that there were "3 serious problems" and if he wasn't feeling as well as he was they would not let him go home but they did. She asked for the Echo to go to Dr. Harrington Challenger and wanted [place] to be aware that the report will be coming to [place] for review. I advised her that it is after 5pm but we will look for it to come to [place] hopefully tomorrow and will forward to the MD for review. While I was on the phone with her... another MD was calling and speaking with her husband and she said she needed to hang up and talk with him not sure who but will forward message to triage in case a call comes in tomorrow.

## 2018-08-29 NOTE — Telephone Encounter (Signed)
New message   Patient's wife has questions about an echo that was done in [facility] with Dr. Maryan Rued. Please call to discuss.

## 2018-08-30 ENCOUNTER — Telehealth: Payer: Self-pay | Admitting: Internal Medicine

## 2018-08-30 NOTE — Telephone Encounter (Signed)
REviewed.   With this change it would be important to review images to compare pictures from 1 year ago. By report, AV is severely stenotic but gradient (pressures) do not suggest this     Pumping function reported mildly down  Would be important to review scan itselt.   Can pt contact group so that disc can be sent?

## 2018-08-30 NOTE — Telephone Encounter (Signed)
Called the pt and wife and endorsed to them Dr Harrington Challenger interpretation of echo report reviewed in care everywhere, on the pt yesterday. I advised the pts wife that Dr Harrington Challenger would ideally like to have his echo that was done at [facility] yesterday scanned onto a disc and sent or brought to our office for her to read.  Wife states she is going to call [place] records now and request that this be scanned onto a disc so that she can pick this up, and bring this to our office for Dr Harrington Challenger to review.  Informed the pts wife that I will let Dr Harrington Challenger and Caren Hazy RN know that this is being handled, and wife will bring the disc to our office, once scanned and received.

## 2018-08-30 NOTE — Telephone Encounter (Signed)
Spoke to patient's wife  SHe said patient was feeling pretty good today WIll review echo from [place] when arrives  (? At office, ? Scan)

## 2018-08-30 NOTE — Telephone Encounter (Signed)
Spoke with patient;s wife Pt seen by IM a few days ago  Discussion about admitting  Pt scared  Wife scared Per wife, pt is breathing OK  " as good as he ever does"    Pt's wife says he is not on oxygen   ? If needs  She says he is breathing  Will need to get echo report scanned   REview sleep eval

## 2018-08-30 NOTE — Telephone Encounter (Signed)
  Patient had echo done at [facility] and the family would like for Dr Harrington Challenger to look over the results. PCP had ordered the echo. [facility] told the family that our office needs to call to request the results. Call [phone] to request

## 2018-08-30 NOTE — Telephone Encounter (Signed)
Dr. Harrington Challenger you can see the pts echo report in care everywhere under [facility] and results, request new updates.  His echo report is now available.

## 2018-08-30 NOTE — Telephone Encounter (Signed)
Dr. Harrington Challenger, here is brief summary from the echo:    Summary EF and AS is a significant change from prior study TDS Moderate mitral annular calcification. Mild eccentric jet mitral regurgitation. Moderate aortic valve sclerosis. Severe aortic stenosis, with mild aortic insufficiency. Tricuspid valve is structurally normal. Mild tricuspid regurgitation. Mild concentric left ventricular hypertrophy Diastolic function is indeterminate Ejection fraction is visually estimated at 40-45% Signature

## 2018-08-30 NOTE — Telephone Encounter (Signed)
I spoke to [facility] and they will fax Echo results. 7/15

## 2018-08-31 NOTE — Telephone Encounter (Signed)
Patient's wife dropped off disc at office of patient's echo.

## 2018-09-01 NOTE — Telephone Encounter (Signed)
New Message    Patient's wife calling back to see if Dr. Harrington Challenger reviewed the disc and what were her thoughts on it.  Please call patient back.

## 2018-09-05 NOTE — Telephone Encounter (Signed)
°  Patient's wife called this morning. She thought she was going to hear from Dr. Harrington Challenger yesterday, but did not hear anything. She is concerned about her Husband's echo. Please call the patient's wife back.

## 2018-09-06 ENCOUNTER — Telehealth: Payer: Self-pay | Admitting: Internal Medicine

## 2018-09-06 NOTE — Telephone Encounter (Signed)
Wife was under the impression she was going to receive a call to set up the patient's heart cath procedure. She has not heard anything yet, and just wants to make sure she wasn't forgotten about.

## 2018-09-06 NOTE — Telephone Encounter (Signed)
Spoke to patient and wife I have reviewe outside echo from [place]. LVEF is down from last fall AS may be more severe Pt in afib at time  I would recomm R and L heart cath for anatomy and valve measurements first Pt will need precath labs Understands risks/benefits Agrees to proceed

## 2018-09-06 NOTE — Telephone Encounter (Signed)
Spoke with patient's wife.  Her son and daughter in law would prefer to be in town for procedure and will be away next week.   She wanted to know if the pt would be ok to wait until first week of Aug. I spoke with Dr. Harrington Challenger.  Ok to have done early in first week of Aug.  R and L heart cath scheduled for Tue 09/19/18 with Dr. Ellyn Hack.  7:30 am, arrive at 5:30 am. Will have covid testing at [facility] 09/15/18 Arranged ov with Richardson Dopp for updating H&P on 09/13/18.  Can get labs and EKG if needed at that time.  Pt's wife aware of above and will pick up instruction letter on 09/13/18.  She is appreciative for all information provided.

## 2018-09-07 ENCOUNTER — Telehealth: Payer: Self-pay | Admitting: Internal Medicine

## 2018-09-07 NOTE — Telephone Encounter (Signed)
° °  Daughter Seth Bake) reports that the patient has been very fatigued, and the patient states he has no energy to do anything, and even walking from one end of the house wears him out. He does not report being dizzy specifically, but he does feel unsteady and uses a walker whenever he walks around.   The daughter states that he is taking all of his medications as directed, and does not seem to be having any adverse reactions. She is just worried about the overall fatigue and lack of energy.   The daughter also reports that the patient  occasionally vomits after coughing. He seems to have like a mucus that gets caught in his throat and chest, and vomits a small amount after trying to bring that mucus up.   The daughter also would like someone to go over the results of his recent Echo. His results are posted to his MyChart, but they are not very specific.

## 2018-09-07 NOTE — Telephone Encounter (Signed)
Pt's daughter aware pt is having right and left heart cath on 8/4 due to severe AS and pending results of this Pt to get valve repaired either sx or TAVR ./cy

## 2018-09-08 ENCOUNTER — Telehealth: Payer: Self-pay | Admitting: Internal Medicine

## 2018-09-08 NOTE — H&P (Signed)
Cardiology Admission History and Physical:   Patient ID: Larry Church MRN: [id number]; DOB: 09/25/1932   Admission date: (Not on file)  Primary Care Provider: Lilian Coma., MD Primary Cardiologist: Dorris Carnes, MD    Chief Complaint:  Patient presents for eval of LV dysfunction    Patient Profile:   Larry Church is a 85 y.o. male with hx of CAD, PAF, HTN, HL who presents for evaluation of LV dysfunction and AS    History of Present Illness:   [name] is an 85 yo who follows in cardiology clinic Nx of mild CAD by cath in 2015 (50% distal LAD; 40% LCx)  LVEF on echo in October 2019 55 to 60%   Mild AS (mean gradient 14 mm HG).  The patient also has a history of PAF( not felt to be a candidate due to falls, hx SAH in past ), HTN, HL, OSA, Morbid obesity. He was seen  By [name] in cardiology clinic  For f/u on 08/09/18.  Not active   Some SOB with activity   Occasional chest discomfort He was in afib at the time   HR 114  Metoprolol increased    Since then the pt was seen at [facility] on 7/14   Echo done   Showed LVEF now 40 to 45%   AV appear to be severely narrowed   The pt's wife called our office to give update   Wife worried .   At the time wife said he was OK at rest  No CP   Heart Pathway Score:     Past Medical History:  Diagnosis Date  . Anxiety   . Arthritis    "bad; all over" (09/24/2014)  . Asthma   . Basal cell carcinoma    "cut and burned off  top of head and face"  . Chronic bronchitis ([place])    "got it q yr til this year" (09/24/2014)  . Chronic lower back pain   . Chronic sinusitis   . Coronary artery disease    LHC 10/15: pLAD 20-30, dLAD 50, origin D1 40; oLCx 40 then irregs; oRI 20, RCA diff irregs up to 10-20  . Degenerative arthritis   . Depression   . Echocardiograms    Echo 10/19: EF 55-60, no RWMA, mild AS (mean 14, peak 26), mild to mod AI, ascending aorta 40 mm, MAC, mild LAE, mild reduced RVSF, mild TR, PASP 43  . GERD (gastroesophageal reflux  disease)   . Hypercholesterolemia   . Leg pain    ABIs 11/17: normal  . LV dysfunction    Echo 10/15: Mod LVH, EF 40-50, no apical clot, aortic sclerosis, MAC, trivial MR, mild LAE  . Nuclear stress test    Nuclear stress test 10/19: EF 46, no ischemia; intermediate risk  . OSA on CPAP   . PVC's (premature ventricular contractions)    "I inherited a 4th skip from my mother"  . Seasonal allergies     Past Surgical History:  Procedure Laterality Date  . ANTERIOR CERVICAL DECOMP/DISCECTOMY FUSION  1998  . ANTERIOR CERVICAL DISCECTOMY  1989  . APPENDECTOMY  1967  . BACK SURGERY    . BASAL CELL CARCINOMA EXCISION     "top of my head"  . CARPAL TUNNEL RELEASE Bilateral 1980's  . CATARACT EXTRACTION W/ INTRAOCULAR LENS  IMPLANT, BILATERAL Bilateral 2013  . CHOLECYSTECTOMY    . COLONOSCOPY    . CORONARY ANGIOPLASTY WITH STENT PLACEMENT  ~ 1992  .  EYE MUSCLE SURGERY Right 2013  . IR KYPHO LUMBAR INC FX REDUCE BONE BX UNI/BIL CANNULATION INC/IMAGING  01/23/2018  . JOINT REPLACEMENT    . LEFT HEART CATHETERIZATION WITH CORONARY ANGIOGRAM N/A 12/10/2013   Procedure: LEFT HEART CATHETERIZATION WITH CORONARY ANGIOGRAM;  Surgeon: Leonie Man, MD;  Location: [facility]: [facility];  Service: Cardiovascular;  Laterality: N/A;  . LUMBAR LAMINECTOMY/DECOMPRESSION MICRODISCECTOMY Bilateral 12/04/2012   Procedure: BILATERAL LUMBAR LAMINECTOMY/DECOMPRESSION MICRODISCECTOMY LUMBAR FOUR-TO FIVE SACRALONE;  Surgeon: Elaina Hoops, MD;  Location: [place] [facility];  Service: Neurosurgery;  Laterality: Bilateral;  . REVISION TOTAL HIP ARTHROPLASTY Right 2002   "changed a ball"  . SHOULDER ARTHROSCOPY Bilateral    bone spurs  . TONSILLECTOMY  ~ 1947  . TOTAL HIP ARTHROPLASTY Bilateral 1989-1995   "right-left"  . TOTAL KNEE ARTHROPLASTY Left 09/24/2014  . TOTAL KNEE ARTHROPLASTY Left 09/24/2014   Procedure: TOTAL KNEE ARTHROPLASTY;  Surgeon: Melrose Nakayama, MD;  Location: [facility];  Service: Orthopedics;  Laterality: Left;      Medications Prior to Admission: Prior to Admission medications   Medication Sig Start Date End Date Taking? Authorizing Provider  acetaminophen (TYLENOL) 500 MG tablet Take 500-1,000 mg by mouth every 6 (six) hours as needed for mild pain.    [provider]  alfuzosin (UROXATRAL) 10 MG 24 hr tablet Take 10 mg by mouth daily.  06/30/17   [provider]  aspirin EC 81 MG tablet Take 1 tablet (81 mg total) by mouth daily. 03/17/17   Fay Records, MD  atorvastatin (LIPITOR) 40 MG tablet TAKE 1 TABLET(40 MG) BY MOUTH DAILY 12/07/17   Fay Records, MD  Dextromethorphan-Guaifenesin [facility] DM MAXIMUM STRENGTH) 60-1200 MG TB12 Take 1 tablet by mouth every 12 (twelve) hours as needed (congestion).     [provider]  EPINEPHrine (EPIPEN 2-PAK) 0.3 mg/0.3 mL IJ SOAJ injection Inject 0.3 mLs (0.3 mg total) into the muscle once. 11/25/14   Charlies Silvers, MD  finasteride (PROSCAR) 5 MG tablet Take 5 mg by mouth daily.    [provider]  gabapentin (NEURONTIN) 100 MG capsule Take 1 capsule by mouth 3 (three) times daily. 04/28/18   [provider]  levocetirizine (XYZAL) 5 MG tablet Take 5 mg by mouth daily.    [provider]  memantine (NAMENDA) 10 MG tablet Take 10 mg by mouth 2 (two) times daily.  08/17/17   [provider]  metoprolol tartrate (LOPRESSOR) 25 MG tablet Take 0.5 tablets (12.5 mg total) by mouth daily. 08/09/18   Richardson Dopp T, PA-C  nitroGLYCERIN (NITROSTAT) 0.4 MG SL tablet Place 1 tablet (0.4 mg total) under the tongue every 5 (five) minutes x 3 doses as needed for chest pain. 11/09/17   Richardson Dopp T, PA-C  omeprazole (PRILOSEC) 40 MG capsule Take 40 mg by mouth 2 (two) times daily.  09/02/17   [provider]  traMADol (ULTRAM) 50 MG tablet Take 1 tablet (50 mg total) by mouth every 4 (four) hours as needed for moderate pain. 01/19/18   Charlynne Cousins, MD  venlafaxine XR (EFFEXOR-XR) 75 MG 24 hr  capsule Take 75-150 capsules by mouth See admin instructions. Take 150 mg by mouth in the morning and 75 mg by mouth at bedtime 07/14/16   [provider]     Allergies:    Allergies  Allergen Reactions  . Penicillins Anaphylaxis and Swelling    Has patient had a PCN reaction causing immediate rash, facial/tongue/throat swelling, SOB  or lightheadedness with hypotension: Yes Has patient had a PCN reaction causing severe rash involving mucus membranes or skin necrosis: No Has patient had a PCN reaction that required hospitalization: No Has patient had a PCN reaction occurring within the last 10 years: No If all of the above answers are "NO", then may proceed with Cephalosporin use.  . Bee Venom Swelling  . Donepezil Other (See Comments)    Vertigo  . Methocarbamol     Tiredness    Social History:   Social History   Socioeconomic History  . Marital status: Married    Spouse name: Dixon Boos  . Number of children: 3  . Years of education: Not on file  . Highest education level: Not on file  Occupational History  . Not on file  Social Needs  . Financial resource strain: Not on file  . Food insecurity    Worry: Not on file    Inability: Not on file  . Transportation needs    Medical: Not on file    Non-medical: Not on file  Tobacco Use  . Smoking status: Former Smoker    Packs/day: 1.00    Years: 4.00    Pack years: 4.00    Types: Cigarettes    Quit date: 02/16/1960    Years since quitting: 58.6  . Smokeless tobacco: Never Used  Substance and Sexual Activity  . Alcohol use: No  . Drug use: No  . Sexual activity: Not on file  Lifestyle  . Physical activity    Days per week: Not on file    Minutes per session: Not on file  . Stress: Not on file  Relationships  . Social Herbalist on phone: Not on file    Gets together: Not on file    Attends religious service: Not on file    Active member of club or organization: Not on file    Attends meetings of  clubs or organizations: Not on file    Relationship status: Not on file  . Intimate partner violence    Fear of current or ex partner: Not on file    Emotionally abused: Not on file    Physically abused: Not on file    Forced sexual activity: Not on file  Other Topics Concern  . Not on file  Social History Narrative  . Not on file    Family History:   The patient's family history includes Cancer in his brother and mother. There is no history of Heart attack, Stroke, or Hypertension.    ROS:  Please see the history of present illness.  All other ROS reviewed and negative.     Physical Exam/Data:  There were no vitals filed for this visit. No intake or output data in the 24 hours ending 09/08/18 1300 Last 3 Weights 08/09/2018 02/04/2018 01/23/2018  Weight (lbs) 237 lb 250 lb 220 lb  Weight (kg) 107.502 kg 113.399 kg 99.791 kg     PE from 08/09/18 Physical Exam  Constitutional: He is oriented to person, place, and time. He appears well-developed and well-nourished. No distress.  HENT:  Head: Normocephalic and atraumatic.  Neck: No thyromegaly present.  Cardiovascular: Normal rate and normal heart sounds. An irregularly irregular rhythm present.  No murmur heard. Pulmonary/Chest: Effort normal and breath sounds normal. He has no rales.  Abdominal: Soft.  Musculoskeletal:        General: Edema (trace-1+ bilat LE edema) present.  Lymphadenopathy:    He has no  cervical adenopathy.  Neurological: He is alert and oriented to person, place, and time.  Skin: Skin is warm and dry.  Psychiatric: He has a normal mood and affect.     EKG:  The ECG that was done 6/15 was personally reviewed and demonstrates Atrial fibrillation 114 bpm  Occasional PVC  Relevant CV Studies:   Laboratory Data:  High Sensitivity Troponin:  No results for input(s): TROPONINIHS in the last 720 hours.    Cardiac EnzymesNo results for input(s): TROPONINI in the last 168 hours. No results for input(s):  TROPIPOC in the last 168 hours.  ChemistryNo results for input(s): NA, K, CL, CO2, GLUCOSE, BUN, CREATININE, CALCIUM, GFRNONAA, GFRAA, ANIONGAP in the last 168 hours.  No results for input(s): PROT, ALBUMIN, AST, ALT, ALKPHOS, BILITOT in the last 168 hours. HematologyNo results for input(s): WBC, RBC, HGB, HCT, MCV, MCH, MCHC, RDW, PLT in the last 168 hours. BNPNo results for input(s): BNP, PROBNP in the last 168 hours.  DDimer No results for input(s): DDIMER in the last 168 hours.   Radiology/Studies:  No results found.  Assessment and Plan:   1   Systoilc CHF   Pt with known mild / mod CAD   Now , echo showing LVEF 40 to 45%   I have reviewed echo images  From [facility]; LVEF may indeed be a little less that that   New  Would recomm L and R cath to redefine anatomy and pressures (for AS0  2.  Aortic stenosis.   As on echo is at least moderate    Plan as above     3   Atrial fib  Pt remains in afib   Rates on holter earlier this year average HR 99   Follow   Not on anticoagulatoin due to falls, hx SAH  4.  Hx PVC  5  Morbid obesity   Activity is limite  6   OSA      For questions or updates, please contact [place] Please consult [email or web address] for contact info under        Signed, Dorris Carnes, MD  09/08/2018 1:00 PM

## 2018-09-08 NOTE — Telephone Encounter (Signed)
Spoke to patient's wife  He is doing a lot better today   Had some nausea yesterday   Breathing is OK  Discussed plans for heart cat hon 09/19/18   OK to wait

## 2018-09-11 ENCOUNTER — Encounter: Payer: Self-pay | Admitting: *Deleted

## 2018-09-12 ENCOUNTER — Telehealth: Payer: Self-pay | Admitting: Physician Assistant

## 2018-09-12 NOTE — Telephone Encounter (Signed)
New Message         COVID-19 Pre-Screening Questions:   In the past 7 to 10 days have you had a cough,  shortness of breath, headache, congestion, fever (100 or greater) body aches, chills, sore throat, or sudden loss of taste or sense of smell? NO  Have you been around anyone with known Covid 19. NO  Have you been around anyone who is awaiting Covid 19 test results in the past 7 to 10 days? NO  Have you been around anyone who has been exposed to Covid 19, or has mentioned symptoms of Covid 19 within the past 7 to 10 days? NO Pts wife will be with him for walking assistance and she answered NO to all questions   If you have any concerns/questions about symptoms patients report during screening (either on the phone or at threshold). Contact the provider seeing the patient or DOD for further guidance.  If neither are available contact a member of the leadership team.

## 2018-09-13 ENCOUNTER — Other Ambulatory Visit: Payer: Self-pay

## 2018-09-13 ENCOUNTER — Encounter: Payer: Self-pay | Admitting: Physician Assistant

## 2018-09-13 ENCOUNTER — Ambulatory Visit: Payer: Medicare Other | Admitting: Physician Assistant

## 2018-09-13 VITALS — BP 120/72 | HR 61 | Ht 65.5 in | Wt 241.8 lb

## 2018-09-13 DIAGNOSIS — I480 Paroxysmal atrial fibrillation: Secondary | ICD-10-CM

## 2018-09-13 DIAGNOSIS — I5022 Chronic systolic (congestive) heart failure: Secondary | ICD-10-CM | POA: Diagnosis not present

## 2018-09-13 DIAGNOSIS — I350 Nonrheumatic aortic (valve) stenosis: Secondary | ICD-10-CM

## 2018-09-13 DIAGNOSIS — I2510 Atherosclerotic heart disease of native coronary artery without angina pectoris: Secondary | ICD-10-CM

## 2018-09-13 DIAGNOSIS — I10 Essential (primary) hypertension: Secondary | ICD-10-CM

## 2018-09-13 MED ORDER — METOPROLOL TARTRATE 25 MG PO TABS: 37.5000 mg | ORAL_TABLET | Freq: Every day | ORAL | 3 refills | Status: DC

## 2018-09-13 NOTE — Patient Instructions (Addendum)
  Medication Instructions:    TAKE METOPROLOL 12.5 MG  IN THE AM AND 2 5MG  IN THE PM   If you need a refill on your cardiac medications before your next appointment, please call your pharmacy.   Lab work: [place]   If you have labs (blood work) drawn today and your tests are completely normal, you will receive your results only by: Marland Kitchen MyChart Message (if you have MyChart) OR . A paper copy in the mail If you have any lab test that is abnormal or we need to change your treatment, we will call you to review the results.  Testing/Procedures: NONE ORDERED  TODAY   Follow-Up: IN 2 WEEKS  POST CATH AFTER  09-19-2018  WITH WEAVER   Any Other Special Instructions Will Be Listed Below (If Applicable).
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
## 2018-09-14 LAB — BASIC METABOLIC PANEL
BUN/Creatinine Ratio: 17 (ref 10–24)
BUN: 18 mg/dL (ref 8–27)
CO2: 23 mmol/L (ref 20–29)
Calcium: 9.2 mg/dL (ref 8.6–10.2)
Chloride: 102 mmol/L (ref 96–106)
Creatinine, Ser: 1.07 mg/dL (ref 0.76–1.27)
GFR calc Af Amer: 74 mL/min/{1.73_m2} (ref >59)
GFR calc non Af Amer: 64 mL/min/{1.73_m2} (ref >59)
Glucose: 89 mg/dL (ref 65–99)
Potassium: 4.5 mmol/L (ref 3.5–5.2)
Sodium: 139 mmol/L (ref 134–144)

## 2018-09-14 LAB — CBC
Hematocrit: 40.8 % (ref 37.5–51.0)
Hemoglobin: 13.7 g/dL (ref 13.0–17.7)
MCH: 33.1 pg — ABNORMAL HIGH (ref 26.6–33.0)
MCHC: 33.6 g/dL (ref 31.5–35.7)
MCV: 99 fL — ABNORMAL HIGH (ref 79–97)
Platelets: 299 10*3/uL (ref 150–450)
RBC: 4.14 x10E6/uL (ref 4.14–5.80)
RDW: 14.1 % (ref 11.6–15.4)
WBC: 11.8 10*3/uL — ABNORMAL HIGH (ref 3.4–10.8)

## 2018-09-15 ENCOUNTER — Other Ambulatory Visit (HOSPITAL_COMMUNITY)
Admission: RE | Admit: 2018-09-15 | Discharge: 2018-09-15 | Disposition: A | Discharge status: Home / Self Care | Payer: Medicare Other | Source: Ambulatory Visit | Attending: Cardiology | Admitting: Cardiology

## 2018-09-15 DIAGNOSIS — Z20828 Contact with and (suspected) exposure to other viral communicable diseases: Secondary | ICD-10-CM | POA: Insufficient documentation

## 2018-09-15 DIAGNOSIS — Z01812 Encounter for preprocedural laboratory examination: Secondary | ICD-10-CM | POA: Diagnosis present

## 2018-09-15 LAB — SARS CORONAVIRUS 2 (TAT 6-24 HRS): SARS Coronavirus 2: NEGATIVE

## 2018-09-18 ENCOUNTER — Telehealth: Payer: Self-pay | Admitting: *Deleted

## 2018-09-18 NOTE — Telephone Encounter (Signed)
Pt contacted pre-catheterization scheduled at [facility] for: Tuesday August 4,2020 7:30 AM Verified arrival time and place: [place] Entrance A at: 5:30 AM  Covid-19 test date: 09/14/28  No solid food after midnight prior to cath, clear liquids until 5 AM day of procedure. Contrast allergy: no  AM meds can be  taken pre-cath with sip of water including: ASA 81 mg   Confirmed patient has responsible person to drive home post procedure and observe 24 hours after arriving home: yes  Due to Covid-19 pandemic, only one support person will be allowed with patient. Must be the same support person for that patient's entire stay, will be screened and required to wear a mask.   Patients are required to wear a mask when they enter the hospital.      COVID-19 Pre-Screening Questions:  . In the past 7 to 10 days have you had a cough,  shortness of breath, headache, congestion, fever (100 or greater) body aches, chills, sore throat, or sudden loss of taste or sense of smell? no . Have you been around anyone with known Covid 19? no . Have you been around anyone who is awaiting Covid 19 test results in the past 7 to 10 days? no . Have you been around anyone who has been exposed to Covid 19, or has mentioned symptoms of Covid 19 within the past 7 to 10 ? no   I reviewed procedure,mask,visitor instructions, Covid-19 screening questions with patient's wife (with patient's verbal permission, she verbalized understanding, thanked me for call.

## 2018-09-19 ENCOUNTER — Ambulatory Visit (HOSPITAL_COMMUNITY)
Admission: RE | Admit: 2018-09-19 | Discharge: 2018-09-19 | Disposition: A | Discharge status: Home / Self Care | Payer: Medicare Other | Attending: Cardiology | Admitting: Cardiology

## 2018-09-19 ENCOUNTER — Other Ambulatory Visit: Payer: Self-pay

## 2018-09-19 ENCOUNTER — Encounter (HOSPITAL_COMMUNITY)
Admission: RE | Disposition: A | Discharge status: Home / Self Care | Payer: Medicare Other | Source: Home / Self Care | Attending: Cardiology

## 2018-09-19 DIAGNOSIS — I350 Nonrheumatic aortic (valve) stenosis: Secondary | ICD-10-CM

## 2018-09-19 DIAGNOSIS — Z96643 Presence of artificial hip joint, bilateral: Secondary | ICD-10-CM | POA: Diagnosis not present

## 2018-09-19 DIAGNOSIS — Z888 Allergy status to other drugs, medicaments and biological substances status: Secondary | ICD-10-CM | POA: Diagnosis not present

## 2018-09-19 DIAGNOSIS — M1990 Unspecified osteoarthritis, unspecified site: Secondary | ICD-10-CM | POA: Insufficient documentation

## 2018-09-19 DIAGNOSIS — Z87891 Personal history of nicotine dependence: Secondary | ICD-10-CM | POA: Insufficient documentation

## 2018-09-19 DIAGNOSIS — I110 Hypertensive heart disease with heart failure: Secondary | ICD-10-CM | POA: Insufficient documentation

## 2018-09-19 DIAGNOSIS — K219 Gastro-esophageal reflux disease without esophagitis: Secondary | ICD-10-CM | POA: Insufficient documentation

## 2018-09-19 DIAGNOSIS — Z79899 Other long term (current) drug therapy: Secondary | ICD-10-CM | POA: Insufficient documentation

## 2018-09-19 DIAGNOSIS — I420 Dilated cardiomyopathy: Secondary | ICD-10-CM | POA: Diagnosis present

## 2018-09-19 DIAGNOSIS — I5043 Acute on chronic combined systolic (congestive) and diastolic (congestive) heart failure: Secondary | ICD-10-CM | POA: Diagnosis not present

## 2018-09-19 DIAGNOSIS — I2510 Atherosclerotic heart disease of native coronary artery without angina pectoris: Secondary | ICD-10-CM | POA: Insufficient documentation

## 2018-09-19 DIAGNOSIS — Z880 Allergy status to penicillin: Secondary | ICD-10-CM | POA: Insufficient documentation

## 2018-09-19 DIAGNOSIS — Z7982 Long term (current) use of aspirin: Secondary | ICD-10-CM | POA: Diagnosis not present

## 2018-09-19 DIAGNOSIS — Z6839 Body mass index (BMI) 39.0-39.9, adult: Secondary | ICD-10-CM | POA: Diagnosis not present

## 2018-09-19 DIAGNOSIS — G4733 Obstructive sleep apnea (adult) (pediatric): Secondary | ICD-10-CM | POA: Diagnosis not present

## 2018-09-19 DIAGNOSIS — E7800 Pure hypercholesterolemia, unspecified: Secondary | ICD-10-CM | POA: Diagnosis not present

## 2018-09-19 DIAGNOSIS — I5022 Chronic systolic (congestive) heart failure: Secondary | ICD-10-CM | POA: Insufficient documentation

## 2018-09-19 DIAGNOSIS — I2584 Coronary atherosclerosis due to calcified coronary lesion: Secondary | ICD-10-CM | POA: Diagnosis not present

## 2018-09-19 DIAGNOSIS — Z96652 Presence of left artificial knee joint: Secondary | ICD-10-CM | POA: Insufficient documentation

## 2018-09-19 DIAGNOSIS — I4811 Longstanding persistent atrial fibrillation: Secondary | ICD-10-CM | POA: Diagnosis present

## 2018-09-19 DIAGNOSIS — I493 Ventricular premature depolarization: Secondary | ICD-10-CM | POA: Insufficient documentation

## 2018-09-19 DIAGNOSIS — Z955 Presence of coronary angioplasty implant and graft: Secondary | ICD-10-CM | POA: Insufficient documentation

## 2018-09-19 DIAGNOSIS — R0609 Other forms of dyspnea: Secondary | ICD-10-CM

## 2018-09-19 DIAGNOSIS — I4821 Permanent atrial fibrillation: Secondary | ICD-10-CM | POA: Insufficient documentation

## 2018-09-19 DIAGNOSIS — J449 Chronic obstructive pulmonary disease, unspecified: Secondary | ICD-10-CM | POA: Insufficient documentation

## 2018-09-19 DIAGNOSIS — R0600 Dyspnea, unspecified: Secondary | ICD-10-CM

## 2018-09-19 HISTORY — PX: RIGHT/LEFT HEART CATH AND CORONARY ANGIOGRAPHY: CATH118266

## 2018-09-19 LAB — POCT I-STAT EG7
Acid-Base Excess: 1 mmol/L (ref 0.0–2.0)
Acid-Base Excess: 1 mmol/L (ref 0.0–2.0)
Acid-Base Excess: 2 mmol/L (ref 0.0–2.0)
Bicarbonate: 26.7 mmol/L (ref 20.0–28.0)
Bicarbonate: 27 mmol/L (ref 20.0–28.0)
Bicarbonate: 27.3 mmol/L (ref 20.0–28.0)
Calcium, Ion: 1.22 mmol/L (ref 1.15–1.40)
Calcium, Ion: 1.28 mmol/L (ref 1.15–1.40)
Calcium, Ion: 1.32 mmol/L (ref 1.15–1.40)
HCT: 38 % — ABNORMAL LOW (ref 39.0–52.0)
HCT: 38 % — ABNORMAL LOW (ref 39.0–52.0)
HCT: 40 % (ref 39.0–52.0)
Hemoglobin: 12.9 g/dL — ABNORMAL LOW (ref 13.0–17.0)
Hemoglobin: 12.9 g/dL — ABNORMAL LOW (ref 13.0–17.0)
Hemoglobin: 13.6 g/dL (ref 13.0–17.0)
O2 Saturation: 61 %
O2 Saturation: 61 %
O2 Saturation: 66 %
Potassium: 4 mmol/L (ref 3.5–5.1)
Potassium: 4.2 mmol/L (ref 3.5–5.1)
Potassium: 4.3 mmol/L (ref 3.5–5.1)
Sodium: 140 mmol/L (ref 135–145)
Sodium: 140 mmol/L (ref 135–145)
Sodium: 141 mmol/L (ref 135–145)
TCO2: 28 mmol/L (ref 22–32)
TCO2: 28 mmol/L (ref 22–32)
TCO2: 29 mmol/L (ref 22–32)
pCO2, Ven: 45.3 mmHg (ref 44.0–60.0)
pCO2, Ven: 46.1 mmHg (ref 44.0–60.0)
pCO2, Ven: 46.7 mmHg (ref 44.0–60.0)
pH, Ven: 7.37 (ref 7.250–7.430)
pH, Ven: 7.378 (ref 7.250–7.430)
pH, Ven: 7.381 (ref 7.250–7.430)
pO2, Ven: 33 mmHg (ref 32.0–45.0)
pO2, Ven: 33 mmHg (ref 32.0–45.0)
pO2, Ven: 35 mmHg (ref 32.0–45.0)

## 2018-09-19 LAB — POCT I-STAT 7, (LYTES, BLD GAS, ICA,H+H)
Acid-Base Excess: 1 mmol/L (ref 0.0–2.0)
Bicarbonate: 25.9 mmol/L (ref 20.0–28.0)
Calcium, Ion: 1.3 mmol/L (ref 1.15–1.40)
HCT: 39 % (ref 39.0–52.0)
Hemoglobin: 13.3 g/dL (ref 13.0–17.0)
O2 Saturation: 98 %
Potassium: 4.3 mmol/L (ref 3.5–5.1)
Sodium: 139 mmol/L (ref 135–145)
TCO2: 27 mmol/L (ref 22–32)
pCO2 arterial: 40.9 mmHg (ref 32.0–48.0)
pH, Arterial: 7.409 (ref 7.350–7.450)
pO2, Arterial: 103 mmHg (ref 83.0–108.0)

## 2018-09-19 LAB — POCT ACTIVATED CLOTTING TIME: Activated Clotting Time: 175 seconds

## 2018-09-19 SURGERY — RIGHT/LEFT HEART CATH AND CORONARY ANGIOGRAPHY
Anesthesia: LOCAL

## 2018-09-19 MED ORDER — HEPARIN (PORCINE) IN NACL 1000-0.9 UT/500ML-% IV SOLN
INTRAVENOUS | Status: DC | PRN
  Administered 2018-09-19 (×2): 500 mL

## 2018-09-19 MED ORDER — VERAPAMIL HCL 2.5 MG/ML IV SOLN
INTRAVENOUS | Status: AC
  Filled 2018-09-19: qty 2

## 2018-09-19 MED ORDER — SODIUM CHLORIDE 0.9% FLUSH: 3.0000 mL | INTRAVENOUS | Status: DC | PRN

## 2018-09-19 MED ORDER — SODIUM CHLORIDE 0.9 % IV SOLN: INTRAVENOUS | Status: AC

## 2018-09-19 MED ORDER — LIDOCAINE HCL (PF) 1 % IJ SOLN
INTRAMUSCULAR | Status: DC | PRN
  Administered 2018-09-19: 10 mL via SUBCUTANEOUS
  Administered 2018-09-19: 2 mL via SUBCUTANEOUS

## 2018-09-19 MED ORDER — FENTANYL CITRATE (PF) 100 MCG/2ML IJ SOLN
INTRAMUSCULAR | Status: AC
  Filled 2018-09-19: qty 2

## 2018-09-19 MED ORDER — HEPARIN SODIUM (PORCINE) 1000 UNIT/ML IJ SOLN
INTRAMUSCULAR | Status: AC
  Filled 2018-09-19: qty 1

## 2018-09-19 MED ORDER — LIDOCAINE HCL (PF) 1 % IJ SOLN
INTRAMUSCULAR | Status: AC
  Filled 2018-09-19: qty 30

## 2018-09-19 MED ORDER — SODIUM CHLORIDE 0.9% FLUSH: 3.0000 mL | Freq: Two times a day (BID) | INTRAVENOUS | Status: DC

## 2018-09-19 MED ORDER — SODIUM CHLORIDE 0.9 % IV SOLN: 250.0000 mL | INTRAVENOUS | Status: DC | PRN

## 2018-09-19 MED ORDER — SODIUM CHLORIDE 0.9 % IV SOLN
INTRAVENOUS | Status: DC
  Administered 2018-09-19: 07:00:00 via INTRAVENOUS

## 2018-09-19 MED ORDER — HEPARIN (PORCINE) IN NACL 1000-0.9 UT/500ML-% IV SOLN
INTRAVENOUS | Status: AC
  Filled 2018-09-19: qty 1000

## 2018-09-19 MED ORDER — IOHEXOL 350 MG/ML SOLN
INTRAVENOUS | Status: DC | PRN
  Administered 2018-09-19: 75 mL via INTRAVENOUS

## 2018-09-19 MED ORDER — ACETAMINOPHEN 325 MG PO TABS: 650.0000 mg | ORAL_TABLET | ORAL | Status: DC | PRN

## 2018-09-19 MED ORDER — LABETALOL HCL 5 MG/ML IV SOLN: 10.0000 mg | INTRAVENOUS | Status: DC | PRN

## 2018-09-19 MED ORDER — ONDANSETRON HCL 4 MG/2ML IJ SOLN: 4.0000 mg | Freq: Four times a day (QID) | INTRAMUSCULAR | Status: DC | PRN

## 2018-09-19 MED ORDER — HYDRALAZINE HCL 20 MG/ML IJ SOLN: 10.0000 mg | INTRAMUSCULAR | Status: DC | PRN

## 2018-09-19 MED ORDER — VERAPAMIL HCL 2.5 MG/ML IV SOLN
INTRAVENOUS | Status: DC | PRN
  Administered 2018-09-19: 10 mL via INTRA_ARTERIAL

## 2018-09-19 MED ORDER — ISOSORBIDE MONONITRATE ER 30 MG PO TB24: 30.0000 mg | ORAL_TABLET | Freq: Every day | ORAL | 11 refills | Status: DC

## 2018-09-19 MED ORDER — HEPARIN SODIUM (PORCINE) 1000 UNIT/ML IJ SOLN
INTRAMUSCULAR | Status: DC | PRN
  Administered 2018-09-19: 5000 [IU] via INTRAVENOUS

## 2018-09-19 MED ORDER — FENTANYL CITRATE (PF) 100 MCG/2ML IJ SOLN
INTRAMUSCULAR | Status: DC | PRN
  Administered 2018-09-19: 25 ug via INTRAVENOUS

## 2018-09-19 MED ORDER — ASPIRIN 81 MG PO CHEW: 81.0000 mg | CHEWABLE_TABLET | ORAL | Status: DC

## 2018-09-19 SURGICAL SUPPLY — 17 items
CATH 5FR JL3.5 JR4 ANG PIG MP (CATHETERS) ×1 IMPLANT
CATH BALLN WEDGE 5F 110CM (CATHETERS) ×1 IMPLANT
CATH INFINITI 5FR JL4 (CATHETERS) ×1 IMPLANT
DEVICE RAD COMP TR BAND LRG (VASCULAR PRODUCTS) ×1 IMPLANT
GLIDESHEATH SLEND SS 6F .021 (SHEATH) ×1 IMPLANT
GUIDEWIRE INQWIRE 1.5J.035X260 (WIRE) IMPLANT
INQWIRE 1.5J .035X260CM (WIRE) ×2
KIT HEART LEFT (KITS) ×2 IMPLANT
PACK CARDIAC CATHETERIZATION (CUSTOM PROCEDURE TRAY) ×2 IMPLANT
SHEATH GLIDE SLENDER 4/5FR (SHEATH) ×1 IMPLANT
SHEATH PINNACLE 5F 10CM (SHEATH) ×1 IMPLANT
SHEATH PROBE COVER 6X72 (BAG) ×1 IMPLANT
TRANSDUCER W/STOPCOCK (MISCELLANEOUS) ×2 IMPLANT
TUBING CIL FLEX 10 FLL-RA (TUBING) ×2 IMPLANT
WIRE EMERALD 3MM-J .035X150CM (WIRE) ×1 IMPLANT
WIRE EMERALD ST .035X150CM (WIRE) ×1 IMPLANT
WIRE MICROINTRODUCER 60CM (WIRE) ×1 IMPLANT

## 2018-09-19 NOTE — Progress Notes (Signed)
Pressure was held for a total of 30 minutes. Kermit states he will let Dr Ellyn Hack know

## 2018-09-19 NOTE — Progress Notes (Signed)
Called to short stay for right groin hematoma. Nurse held pressure for 15minutes., I held an additional 15 minutes. Site softer. Hematoma extends from stick site , laterally toward right hip. Patient used both hands to pull himself up in bed. Right groin site unchanged, right radial site unchanged.  Bedrest instructions reinforced, I.E. not moving , using right hand, moving hips or leg.  Bedrest starts over at 11:20:00

## 2018-09-19 NOTE — Discharge Instructions (Signed)
Radial Site Care ° °This sheet gives you information about how to care for yourself after your procedure. Your health care provider may also give you more specific instructions. If you have problems or questions, contact your health care provider. °What can I expect after the procedure? °After the procedure, it is common to have: °· Bruising and tenderness at the catheter insertion area. °Follow these instructions at home: °Medicines °· Take over-the-counter and prescription medicines only as told by your health care provider. °Insertion site care °· Follow instructions from your health care provider about how to take care of your insertion site. Make sure you: °? Wash your hands with soap and water before you change your bandage (dressing). If soap and water are not available, use hand sanitizer. °? Change your dressing as told by your health care provider. °? Leave stitches (sutures), skin glue, or adhesive strips in place. These skin closures may need to stay in place for 2 weeks or longer. If adhesive strip edges start to loosen and curl up, you may trim the loose edges. Do not remove adhesive strips completely unless your health care provider tells you to do that. °· Check your insertion site every day for signs of infection. Check for: °? Redness, swelling, or pain. °? Fluid or blood. °? Pus or a bad smell. °? Warmth. °· Do not take baths, swim, or use a hot tub until your health care provider approves. °· You may shower 24-48 hours after the procedure, or as directed by your health care provider. °? Remove the dressing and gently wash the site with plain soap and water. °? Pat the area dry with a clean towel. °? Do not rub the site. That could cause bleeding. °· Do not apply powder or lotion to the site. °Activity ° °· For 24 hours after the procedure, or as directed by your health care provider: °? Do not flex or bend the affected arm. °? Do not push or pull heavy objects with the affected arm. °? Do not  drive yourself home from the hospital or clinic. You may drive 24 hours after the procedure unless your health care provider tells you not to. °? Do not operate machinery or power tools. °· Do not lift anything that is heavier than 10 lb (4.5 kg), or the limit that you are told, until your health care provider says that it is safe. °· Ask your health care provider when it is okay to: °? Return to work or school. °? Resume usual physical activities or sports. °? Resume sexual activity. °General instructions °· If the catheter site starts to bleed, raise your arm and put firm pressure on the site. If the bleeding does not stop, get help right away. This is a medical emergency. °· If you went home on the same day as your procedure, a responsible adult should be with you for the first 24 hours after you arrive home. °· Keep all follow-up visits as told by your health care provider. This is important. °Contact a health care provider if: °· You have a fever. °· You have redness, swelling, or yellow drainage around your insertion site. °Get help right away if: °· You have unusual pain at the radial site. °· The catheter insertion area swells very fast. °· The insertion area is bleeding, and the bleeding does not stop when you hold steady pressure on the area. °· Your arm or hand becomes pale, cool, tingly, or numb. °These symptoms may represent a serious problem   that is an emergency. Do not wait to see if the symptoms will go away. Get medical help right away. Call your local emergency services (911 in the [place] Do not drive yourself to the hospital. °Summary °· After the procedure, it is common to have bruising and tenderness at the site. °· Follow instructions from your health care provider about how to take care of your radial site wound. Check the wound every day for signs of infection. °· Do not lift anything that is heavier than 10 lb (4.5 kg), or the limit that you are told, until your health care provider says  that it is safe. °This information is not intended to replace advice given to you by your health care provider. Make sure you discuss any questions you have with your health care provider. °Document Released: 03/06/2010 Document Revised: 03/09/2017 Document Reviewed: 03/09/2017 °Elsevier Patient Education © 2020 [employer] ° °Femoral Site Care °This sheet gives you information about how to care for yourself after your procedure. Your health care provider may also give you more specific instructions. If you have problems or questions, contact your health care provider. °What can I expect after the procedure? °After the procedure, it is common to have: °· Bruising that usually fades within 1-2 weeks. °· Tenderness at the site. °Follow these instructions at home: °Wound care °· Follow instructions from your health care provider about how to take care of your insertion site. Make sure you: °? Wash your hands with soap and water before you change your bandage (dressing). If soap and water are not available, use hand sanitizer. °? Change your dressing as told by your health care provider. °? Leave stitches (sutures), skin glue, or adhesive strips in place. These skin closures may need to stay in place for 2 weeks or longer. If adhesive strip edges start to loosen and curl up, you may trim the loose edges. Do not remove adhesive strips completely unless your health care provider tells you to do that. °· Do not take baths, swim, or use a hot tub until your health care provider approves. °· You may shower 24-48 hours after the procedure or as told by your health care provider. °? Gently wash the site with plain soap and water. °? Pat the area dry with a clean towel. °? Do not rub the site. This may cause bleeding. °· Do not apply powder or lotion to the site. Keep the site clean and dry. °· Check your femoral site every day for signs of infection. Check for: °? Redness, swelling, or pain. °? Fluid or  blood. °? Warmth. °? Pus or a bad smell. °Activity °· For the first 2-3 days after your procedure, or as long as directed: °? Avoid climbing stairs as much as possible. °? Do not squat. °· Do not lift anything that is heavier than 10 lb (4.5 kg), or the limit that you are told, until your health care provider says that it is safe. °· Rest as directed. °? Avoid sitting for a long time without moving. Get up to take short walks every 1-2 hours. °· Do not drive for 24 hours if you were given a medicine to help you relax (sedative). °General instructions °· Take over-the-counter and prescription medicines only as told by your health care provider. °· Keep all follow-up visits as told by your health care provider. This is important. °Contact a health care provider if you have: °· A fever or chills. °· You have redness, swelling, or pain   around your insertion site. °Get help right away if: °· The catheter insertion area swells very fast. °· You pass out. °· You suddenly start to sweat or your skin gets clammy. °· The catheter insertion area is bleeding, and the bleeding does not stop when you hold steady pressure on the area. °· The area near or just beyond the catheter insertion site becomes pale, cool, tingly, or numb. °These symptoms may represent a serious problem that is an emergency. Do not wait to see if the symptoms will go away. Get medical help right away. Call your local emergency services (911 in the [place] Do not drive yourself to the hospital. °Summary °· After the procedure, it is common to have bruising that usually fades within 1-2 weeks. °· Check your femoral site every day for signs of infection. °· Do not lift anything that is heavier than 10 lb (4.5 kg), or the limit that you are told, until your health care provider says that it is safe. °This information is not intended to replace advice given to you by your health care provider. Make sure you discuss any questions you have with your health care  provider. °Document Released: 10/05/2013 Document Revised: 02/14/2017 Document Reviewed: 02/14/2017 °Elsevier Patient Education © 2020 [employer] Inc[employer] ° °

## 2018-09-19 NOTE — Progress Notes (Signed)
    5 Fr R F/A sheath was pulled by Robynn Pane RN, and pressure was held for 20 min. Sterile gauze was applied at the site. R groin is soft and non tender.  Bed rest started at 0945 X 4 hr. Instructions were given to patient about bed rest.  R dp palpable before and after the sheath pull.  Hr 90's Afib BP119/70 sPO2 93% on R/A

## 2018-09-19 NOTE — Progress Notes (Signed)
Discharge instructions reviewed with pt and his wife. Voices understanding.

## 2018-09-19 NOTE — Progress Notes (Addendum)
Hematoma noted to right groin. Pressure held times 15 minutes, Cath lab called and asked to assess area Pressure held buy Larry Church Pt was coughing and moving about. Instructed pt not to move the right leg and right arm.

## 2018-09-19 NOTE — Progress Notes (Addendum)
Area to right groin remains with a Hematoma Vin, PA called and asked him to come assess site, Pt wife Stanton Kidney called and asked to come back and sit with pt. Due to moving right wrist and right leg.

## 2018-09-19 NOTE — Progress Notes (Addendum)
Vin, PA in to assess right groin states that its ok. Pt wife at bedside

## 2018-09-19 NOTE — Interval H&P Note (Signed)
History and Physical Interval Note:  09/19/2018 7:18 AM  Larry Church  has presented today for surgery, with the diagnosis of aortic stenosis and cardiomyopathy / CHF.  The various methods of treatment have been discussed with the patient and family. After consideration of risks, benefits and other options for treatment, the patient has consented to  Procedure(s): RIGHT/LEFT HEART CATH AND CORONARY ANGIOGRAPHY (N/A) . PERCUTANEOUS CORONARY INTERVENTION  as a surgical intervention.  The patient's history has been reviewed, patient examined, no change in status, stable for surgery.  I have reviewed the patient's chart and labs.  Questions were answered to the patient's satisfaction.    Cath Lab Visit (complete for each Cath Lab visit)  Clinical Evaluation Leading to the Procedure:   ACS: No.  Non-ACS:    Anginal Classification: CCS II  Anti-ischemic medical therapy: Minimal Therapy (1 class of medications)  Non-Invasive Test Results: Equivocal test results - Echo with reduced EF & possibly worsened AS  Prior CABG: No previous CABG  Glenetta Hew

## 2018-09-20 ENCOUNTER — Encounter (HOSPITAL_COMMUNITY): Payer: Self-pay | Admitting: Cardiology

## 2018-09-20 ENCOUNTER — Telehealth: Payer: Self-pay | Admitting: Internal Medicine

## 2018-09-20 NOTE — Telephone Encounter (Signed)
I do not think this is a reaction to Imdur  Sounds infections or food poisoning  Would try again next week

## 2018-09-20 NOTE — Telephone Encounter (Signed)
Called patient back. His wife reports that he started taking imdur 30 mg last night. This morning after breakfast he began vomiting and having diarhea. She denies any other symptoms for him at this time. She is concerned this reaction is from the Imdur and want's Dr. Alan Ripper recommendation on this. I will consult with Dr. Harrington Challenger.

## 2018-09-20 NOTE — Telephone Encounter (Signed)
New Message   Pt c/o medication issue:  1. Name of Medication: isosorbide mononitrate (IMDUR) 30 MG 24 hr tablet   2. How are you currently taking this medication (dosage and times per day)?  3. Are you having a reaction (difficulty breathing--STAT)?   4. What is your medication issue? Patient had a heart cath on 8/4 and was prescribed this medication. Patients wife states that he has been vomiting since taking this medication. Please call to discuss.

## 2018-09-21 NOTE — Telephone Encounter (Signed)
The patient will continue to monitor his symptoms and report back on Monday.  I shared Dr Harrington Challenger' advice and they will continue to monitor progress.

## 2018-09-21 NOTE — Telephone Encounter (Signed)
Follow up    Patient is following up on Dr. Harrington Challenger recommendations. Please call.

## 2018-09-26 ENCOUNTER — Telehealth: Payer: Self-pay | Admitting: Internal Medicine

## 2018-09-26 NOTE — Telephone Encounter (Signed)
New Message   Patients wife is calling because she has several questions and concerns. She wants to see about getting an order for a wheelchair. Questions about medication and multiple of other things. Please call to discuss.

## 2018-09-26 NOTE — Telephone Encounter (Signed)
Mrs. Marxen wants to know if Dr. Harrington Challenger would approve a motorized wheelchair for patient.  I recommended she ask his PCP.    She never knows what causes funny feeling in chest, weak, cant breath.  Reviewed that he has PAF and that this is likely what he feels.  Has not felt in several days at least that she knows of.   "Sore in his stomach, had a hard time, he bled,   Very bruised.  Spread from one side to the other side right away, soon after his cath".  Has not changed since then.  Does not have any swelling.  No signs of infection at site.  Just started imdur again, after Dr. Harrington Challenger adv to wait a week and start it again.  The GI symptoms he had have resolved.  Reassured Dr. Harrington Challenger did not feel the GI symptoms were from [facility].  She wishes for a sooner appointment because of the bruising.  I adv he should wait until 2 more weeks until his scheduled appointment so that we can see how he is feeling after taking the new medicine, especially since he just restarted it today. She is in agreement with this and will call if there are any further concerns in the meantime.

## 2018-09-28 ENCOUNTER — Telehealth: Payer: Self-pay | Admitting: Medical

## 2018-09-28 NOTE — Telephone Encounter (Signed)
   Patient called the after hours line with complaints of a lump following [facility] 09/19/2018. Patient reports a lump in his abdomen which resolved after he changed pants. He also has a separate tender spot on his abdomen. No complaints of drainage, bleeding, or lump at his catheterization site. He reports last BM was yesterday and he takes stool softeners/laxatives. Favor watchful waiting. Instructed patient to follow up with his PCP if this reoccurs as he may have a hernia. Patient in agreement with the plan.  Abigail Butts, PA-C 09/28/18; 6:16 PM

## 2018-10-05 ENCOUNTER — Telehealth: Payer: Self-pay | Admitting: Internal Medicine

## 2018-10-05 NOTE — Telephone Encounter (Signed)
The patient's wife was called by Dr. Harrington Challenger.  Discussed bruising.  No new orders received by this nurse. Pt has follow up next week with APP.

## 2018-10-05 NOTE — Telephone Encounter (Signed)
  Wife is calling because Larry Church is very bruised and sore since his cath procedure. She would like to know if there is anything they can do to for the soreness.
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
## 2018-10-10 ENCOUNTER — Encounter: Payer: Self-pay | Admitting: Physician Assistant

## 2018-10-10 ENCOUNTER — Other Ambulatory Visit: Payer: Self-pay

## 2018-10-10 ENCOUNTER — Ambulatory Visit (INDEPENDENT_AMBULATORY_CARE_PROVIDER_SITE_OTHER): Payer: Medicare Other | Admitting: Physician Assistant

## 2018-10-10 VITALS — BP 106/60 | HR 82 | Ht 65.0 in | Wt 238.0 lb

## 2018-10-10 DIAGNOSIS — I5022 Chronic systolic (congestive) heart failure: Secondary | ICD-10-CM | POA: Diagnosis not present

## 2018-10-10 DIAGNOSIS — I480 Paroxysmal atrial fibrillation: Secondary | ICD-10-CM

## 2018-10-10 DIAGNOSIS — I350 Nonrheumatic aortic (valve) stenosis: Secondary | ICD-10-CM | POA: Diagnosis not present

## 2018-10-10 DIAGNOSIS — I493 Ventricular premature depolarization: Secondary | ICD-10-CM

## 2018-10-10 DIAGNOSIS — I2510 Atherosclerotic heart disease of native coronary artery without angina pectoris: Secondary | ICD-10-CM

## 2018-10-10 DIAGNOSIS — R0602 Shortness of breath: Secondary | ICD-10-CM | POA: Diagnosis not present

## 2018-10-10 DIAGNOSIS — E782 Mixed hyperlipidemia: Secondary | ICD-10-CM

## 2018-10-10 DIAGNOSIS — I10 Essential (primary) hypertension: Secondary | ICD-10-CM

## 2018-10-10 MED ORDER — FUROSEMIDE 20 MG PO TABS: 20.0000 mg | ORAL_TABLET | Freq: Every day | ORAL | 3 refills | Status: DC | PRN

## 2018-10-10 NOTE — Patient Instructions (Signed)
Medication Instructions:  Start Lasix 20 mg once daily as needed  If you need a refill on your cardiac medications before your next appointment, please call your pharmacy.   Lab work: None today  If you have labs (blood work) drawn today and your tests are completely normal, you will receive your results only by: Marland Kitchen MyChart Message (if you have MyChart) OR . A paper copy in the mail If you have any lab test that is abnormal or we need to change your treatment, we will call you to review the results.  Testing/Procedures: None   Follow-Up: At [facility], you and your health needs are our priority.  As part of our continuing mission to provide you with exceptional heart care, we have created designated Provider Care Teams.  These Care Teams include your primary Cardiologist (physician) and Advanced Practice Providers (APPs -  Physician Assistants and Nurse Practitioners) who all work together to provide you with the care you need, when you need it. . Dr. Harrington Challenger 11/03/2018 as scheduled  Any Other Special Instructions Will Be Listed Below (If Applicable).  Weigh daily If your weight increases > 3 lbs in 1 day, take Lasix 20 mg that day  You can try to take nitroglycerin for that chest pain you sometimes have around meals in the afternoon.  Just make sure your blood pressure (the top number) is over 110 before you take that.    If you ever have severe "crushing" chest pain, do not worry about taking your blood pressure first.  Just take the nitroglycerin as directed.

## 2018-11-03 ENCOUNTER — Ambulatory Visit (INDEPENDENT_AMBULATORY_CARE_PROVIDER_SITE_OTHER): Payer: Medicare Other | Admitting: Internal Medicine

## 2018-11-03 ENCOUNTER — Other Ambulatory Visit: Payer: Self-pay

## 2018-11-03 ENCOUNTER — Encounter: Payer: Self-pay | Admitting: Internal Medicine

## 2018-11-03 VITALS — BP 96/68 | HR 81 | Ht 65.0 in | Wt 240.0 lb

## 2018-11-03 DIAGNOSIS — I2510 Atherosclerotic heart disease of native coronary artery without angina pectoris: Secondary | ICD-10-CM | POA: Diagnosis not present
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
## 2018-11-03 NOTE — Patient Instructions (Signed)
Medication Instructions:  No change If you need a refill on your cardiac medications before your next appointment, please call your pharmacy.   Lab work: Today: lipids If you have labs (blood work) drawn today and your tests are completely normal, you will receive your results only by: Marland Kitchen MyChart Message (if you have MyChart) OR . A paper copy in the mail If you have any lab test that is abnormal or we need to change your treatment, we will call you to review the results.  Testing/Procedures: none  Follow-Up:Your physician recommends that you schedule a follow-up appointment in: about 5 months with Richardson Dopp, APP  Any Other Special Instructions Will Be Listed Below (If Applicable).

## 2018-11-04 LAB — LIPID PANEL
Chol/HDL Ratio: 2.5 ratio (ref 0.0–5.0)
Cholesterol, Total: 114 mg/dL (ref 100–199)
HDL: 46 mg/dL (ref >39)
LDL Chol Calc (NIH): 48 mg/dL (ref 0–99)
Triglycerides: 110 mg/dL (ref 0–149)
VLDL Cholesterol Cal: 20 mg/dL (ref 5–40)

## 2018-11-16 ENCOUNTER — Telehealth (HOSPITAL_COMMUNITY): Payer: Self-pay

## 2018-11-16 NOTE — Telephone Encounter (Signed)
New message    Call and spoke with the patient wife to schedule a year return echocardiogram ordered by Richardson Dopp.   The patient wife needs clarification on whether the patient needs echo due to a recent heart Cath in August.

## 2018-11-16 NOTE — Telephone Encounter (Signed)
Called and informed patient's wife echo is not needed at this time.  Cancelled order/request.

## 2018-11-16 NOTE — Telephone Encounter (Signed)
Dr. Harrington Challenger saw the pt 11/03/18... will forward to her to see when clinically appropriate for pt to have another Echo.

## 2018-11-16 NOTE — Telephone Encounter (Signed)
I would not recommend echo now .   Would reeval when he comes for his clinic appt in future

## 2018-12-09 ENCOUNTER — Other Ambulatory Visit: Payer: Self-pay | Admitting: Internal Medicine

## 2018-12-09 DIAGNOSIS — E7800 Pure hypercholesterolemia, unspecified: Secondary | ICD-10-CM

## 2018-12-09 DIAGNOSIS — I2510 Atherosclerotic heart disease of native coronary artery without angina pectoris: Secondary | ICD-10-CM

## 2019-01-07 ENCOUNTER — Other Ambulatory Visit: Payer: Self-pay | Admitting: Internal Medicine

## 2019-01-15 ENCOUNTER — Other Ambulatory Visit: Payer: Self-pay | Admitting: Physician Assistant

## 2019-02-13 ENCOUNTER — Telehealth: Payer: Self-pay | Admitting: Internal Medicine

## 2019-02-13 NOTE — Telephone Encounter (Signed)
Agree with plan. Thank you, Richardson Dopp, PA-C    02/13/2019 5:07 PM

## 2019-02-13 NOTE — Telephone Encounter (Signed)
I spoke with patient's wife, Larry Church, who reports that the patient has been having some shoulder pain that radiates to his elbow. He is currently not having symptoms now. He has had this pain on and off. Patient has had previous shoulder surgery and thought that it could something going on with his nerve but was seen by neurology who advised that he call us. The pain is not sharp. Currently his elbow hurts him when he moves it but has no other complaints. Patient denies chest pain, SOB, numbness/tingling,  nausea/vomitting, lightheadedness or dizziness. Patient's wife states that he does have nitroglycerin and they do know how and when to use it. I advised her if symptoms return suddenly and worsen along with additional symptoms that they should call EMS. She verbalized understanding.  The patient has an appointment with his PCP on 12/31.  I have also moved up the patient's appointment with Richardson Dopp, PA-C to 02/27/19.

## 2019-02-13 NOTE — Telephone Encounter (Signed)
New Message  Patient has pain in his left arm that radiates to his elbow, shoulder, and back. Patient was seen by a neurologist and the neurologist wanted the patient to notify the cardiologist about the pain in the left arm that radiates to the elbow, shoulder, and back. Please give patient's daughter a call at [phone].
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
## 2019-02-27 ENCOUNTER — Other Ambulatory Visit: Payer: Self-pay

## 2019-02-27 ENCOUNTER — Encounter: Payer: Self-pay | Admitting: Physician Assistant

## 2019-02-27 ENCOUNTER — Ambulatory Visit: Payer: Medicare Other | Admitting: Physician Assistant

## 2019-02-27 VITALS — BP 100/60 | HR 79 | Ht 65.0 in | Wt 240.0 lb

## 2019-02-27 DIAGNOSIS — I25119 Atherosclerotic heart disease of native coronary artery with unspecified angina pectoris: Secondary | ICD-10-CM | POA: Diagnosis not present

## 2019-02-27 DIAGNOSIS — I10 Essential (primary) hypertension: Secondary | ICD-10-CM

## 2019-02-27 DIAGNOSIS — I5022 Chronic systolic (congestive) heart failure: Secondary | ICD-10-CM

## 2019-02-27 DIAGNOSIS — I350 Nonrheumatic aortic (valve) stenosis: Secondary | ICD-10-CM

## 2019-02-27 DIAGNOSIS — I4821 Permanent atrial fibrillation: Secondary | ICD-10-CM | POA: Diagnosis not present

## 2019-02-27 MED ORDER — METOPROLOL TARTRATE 25 MG PO TABS: 37.5000 mg | ORAL_TABLET | Freq: Every day | ORAL | 3 refills | Status: DC

## 2019-02-27 MED ORDER — ISOSORBIDE MONONITRATE ER 30 MG PO TB24: 15.0000 mg | ORAL_TABLET | Freq: Every day | ORAL | 3 refills | Status: DC

## 2019-02-27 MED ORDER — FUROSEMIDE 20 MG PO TABS: 20.0000 mg | ORAL_TABLET | Freq: Every day | ORAL | 3 refills | Status: DC

## 2019-02-27 NOTE — Patient Instructions (Addendum)
Medication Instructions:   Your physician has recommended you make the following change in your medication:  1) Decrease your Isosorbide to 15MG  (0.5 tablet) by mouth once a day  *If you need a refill on your cardiac medications before your next appointment, please call your pharmacy*  Lab Work:  None ordered today  If you have labs (blood work) drawn today and your tests are completely normal, you will receive your results only by: Marland Kitchen MyChart Message (if you have MyChart) OR . A paper copy in the mail If you have any lab test that is abnormal or we need to change your treatment, we will call you to review the results.  Testing/Procedures:  None ordered today  Follow-Up: At [facility], you and your health needs are our priority.  As part of our continuing mission to provide you with exceptional heart care, we have created designated Provider Care Teams.  These Care Teams include your primary Cardiologist (physician) and Advanced Practice Providers (APPs -  Physician Assistants and Nurse Practitioners) who all work together to provide you with the care you need, when you need it.  Your next appointment:     On 06/05/19 at 1:45PM with Richardson Dopp, PA-C  Other Instructions  If your weight goes up 3 pounds in a day or you have increased shortness of breath and swelling take your Furosemide. If your chest discomfort get worse on Isosorbide 0.5 tablet let us know and go back to the full tablet.

## 2019-03-19 ENCOUNTER — Telehealth: Payer: Self-pay | Admitting: Internal Medicine

## 2019-03-19 NOTE — Telephone Encounter (Signed)
New Message      Pt c/o of Chest Pain: STAT if CP now or developed within 24 hours  1. Are you having CP right now? Pressure feeling, a little bit this morning and in the afternoon, sometimes he feels like he cant breath but he can. Last about 45 minutes today. Uncomfortable feeling  2. Are you experiencing any other symptoms (ex. SOB, nausea, vomiting, sweating)? No   3. How long have you been experiencing CP? Off and on for a few months   4. Is your CP continuous or coming and going? Coming and going   5. Have you taken Nitroglycerin? No  ?

## 2019-03-19 NOTE — Telephone Encounter (Signed)
Attempted to call pt phone rang several times and received a recording not excepting phone calls unable to leave message Will try tom./cy

## 2019-03-20 NOTE — Telephone Encounter (Signed)
Spoke with Larry Church of recommendations from Dr. Harrington Challenger below, including to keep isosorbide at half tablet.  She will cut back to half and try to encourage smaller meals.

## 2019-03-20 NOTE — Telephone Encounter (Signed)
Continues with episodes of feeling in chest that makes him feel weak, sometimes his eyes don't focus as well (can't see like normal), feels a little light headed.  Yesterday this lasted longer yesterday than usual, felt worse than normal.  Insists it is not his heart. BP/HR were 98/68, 82.  Per wife stays so weak and tired but because of afib. Isosorbide was decreased to 1/2 tablet to see if helps him feel stronger.  Didn't make a difference so she put him back on whole tablet daily.  Today, little sore -pressure- in chest, -pt says it is not his heart.  Breathing ok.  Episodes always occurs when he is sitting, never when he is up moving around.  Usually after lunch.  Usually has a big lunch - yesterday ribeye steak sandwich, spinach and mashed potatoes and gravy, chocolate cake and ice cream.   Takes omeprazole twice daily.  Right now he is "perfectly fine".  I suggested that maybe he can reduce the size of his meals since it seems to come after lunch, and eat smaller meals more frequently.   Aware I am forwarding to Dr. Harrington Challenger to make aware and will call her back if there is anything new to do.

## 2019-03-20 NOTE — Telephone Encounter (Signed)
I agree recomm to avoid large meals as symptoms occur after meal   I would not increase isosorbide as his bp may go lower and he will feel weak

## 2019-04-06 ENCOUNTER — Ambulatory Visit: Payer: Medicare Other | Admitting: Physician Assistant

## 2019-04-09 ENCOUNTER — Telehealth: Payer: Self-pay | Admitting: Internal Medicine

## 2019-04-09 NOTE — Telephone Encounter (Signed)
Spoke with patient's wife. He saw GI today as he is complaining on/off of reflux.  Sometimes he has trouble swallowing pills.   No medication was prescribed today but it was suggested that pt consider having endoscopy and colonoscopy. The patient's wife does not feel this is the best decision because he would need to go to the hospital in preparation and he would not do well without her in the hospital.   She just wanted to get Dr. Alan Ripper opinion on this..he is not having any problems or concerns and she does not know when his last colonoscopy was.

## 2019-04-09 NOTE — Telephone Encounter (Signed)
Patient's wife is calling to speak with Caren Hazy, she states that her husband went to see his GI doctor and there was talk of doing a colonoscopy. Patient's wife would like to get Dr. Harrington Challenger and [facility] opinion on the colonoscopy. Wife is not sure if this is something she would like her husband to follow through with considering his heart issues.

## 2019-04-10 NOTE — Telephone Encounter (Signed)
Extreme caution is being taken at hospital when procedures are done Staff works with each family, communication is good  If GI thinks he needs endoscopy I would recomm he have it done

## 2019-04-11 NOTE — Telephone Encounter (Signed)
Patient's wife has been informed of the recommendation.

## 2019-06-05 ENCOUNTER — Ambulatory Visit (INDEPENDENT_AMBULATORY_CARE_PROVIDER_SITE_OTHER): Payer: Medicare Other | Admitting: Cardiology

## 2019-06-05 ENCOUNTER — Other Ambulatory Visit: Payer: Self-pay

## 2019-06-05 ENCOUNTER — Ambulatory Visit: Payer: Medicare Other | Admitting: Physician Assistant

## 2019-06-05 ENCOUNTER — Encounter: Payer: Self-pay | Admitting: Cardiology

## 2019-06-05 VITALS — BP 102/78 | HR 60 | Ht 65.0 in | Wt 246.0 lb

## 2019-06-05 DIAGNOSIS — I4821 Permanent atrial fibrillation: Secondary | ICD-10-CM | POA: Diagnosis not present

## 2019-06-05 DIAGNOSIS — E782 Mixed hyperlipidemia: Secondary | ICD-10-CM

## 2019-06-05 DIAGNOSIS — I5022 Chronic systolic (congestive) heart failure: Secondary | ICD-10-CM

## 2019-06-05 DIAGNOSIS — I10 Essential (primary) hypertension: Secondary | ICD-10-CM | POA: Diagnosis not present

## 2019-06-05 DIAGNOSIS — I25119 Atherosclerotic heart disease of native coronary artery with unspecified angina pectoris: Secondary | ICD-10-CM

## 2019-06-05 MED ORDER — METOPROLOL TARTRATE 25 MG PO TABS: 12.5000 mg | ORAL_TABLET | Freq: Every day | ORAL | 1 refills | Status: DC

## 2019-06-05 NOTE — Patient Instructions (Addendum)
Medication Instructions:   Your physician has recommended you make the following change in your medication:   1) Decrease Metoprolol to 12.5 mg, 0.5 tablet by mouth once a day  *If you need a refill on your cardiac medications before your next appointment, please call your pharmacy*  Lab Work:  None ordered today  Testing/Procedures:  None ordered today  Follow-Up: At [facility], you and your health needs are our priority.  As part of our continuing mission to provide you with exceptional heart care, we have created designated Provider Care Teams.  These Care Teams include your primary Cardiologist (physician) and Advanced Practice Providers (APPs -  Physician Assistants and Nurse Practitioners) who all work together to provide you with the care you need, when you need it.  We recommend signing up for the patient portal called "MyChart".  Sign up information is provided on this After Visit Summary.  MyChart is used to connect with patients for Virtual Visits (Telemedicine).  Patients are able to view lab/test results, encounter notes, upcoming appointments, etc.  Non-urgent messages can be sent to your provider as well.   To learn more about what you can do with MyChart, go to [email or web address].    Your next appointment:   On 09/14/19 at 1:20PM with Dorris Carnes, MD

## 2019-06-05 NOTE — Progress Notes (Signed)
Cardiology Office Note   Date:  06/05/2019   ID:  Larry, Church Apr 07, 1932, MRN [id number]  PCP:  Larry Church., MD  Cardiologist:  Dr. Dorris Carnes, MD   Chief Complaint  Patient presents with  . Follow-up    History of Present Illness: Larry Church is a 86 y.o. male who presents for 8-monthfollow-up, seen by Dr. RHarrington Church  Mr. JDoreyhas a history of CAD (nonobstructive per LHC in 2015 with progression per cath in 09/2018 with severe oRI and moderate proximal mid LAD disease not favorable for PCI with recommendations to treat medically), dilated cardiomyopathy per echocardiogram 08/2018 with EF at 40 to 45%, paroxysmal atrial fibrillation not on anticoagulation secondary to fall risk and history of subarachnoid hemorrhage, moderate aortic stenosis/aortic insufficiency with a  MmHg mean gradient of 11 per echocardiogram 08/2018, hypertension, hyperlipidemia, and PVCs.   Mr. JPemblewas last seen by SRichardson Dopp PA on 02/27/2019 in follow-up.  He recently called the office for bilateral arm pain with chest discomfort felt to be secondary to arthritis in the shoulders with moderate improvement with shoulder injections per Ortho.  Felt to have chronic angina with certain times of exertion which is largely been unchanged per chart review.  Also has chronic shortness of breath which again has been unchanged.  Imdur 15 mg p.o. daily was added to his regimen with plans to consider Ranexa if needed.  Today, Mr. JSheppardpresents with his wife and continues to have symptoms as described above.  He reports his chest discomfort only happens with eating and is worse with lying back. He describes it as a fullness/tightness without associated symptoms such as diaphoresis, shortness of breath or pain radiation.  We discussed that his symptoms seem GI in etiology.  He has been followed in the past as a referral to GI at which time plan was to undergo EGD/colonoscopy however patient and wife deferred given  his chronic comorbid conditions.  I have recommended that he may benefit from seeing them once again. He reports no symptom relief with the addition of Imdur. BP is on the lower side today at 102/78 therefore we discussed reducing his metoprolol to 12.5 mg p.o. daily.  He has no dizziness, palpitations, syncope, LE edema orthopnea symptoms.  He has not needed to use his as needed Lasix.   Past Medical History:  Diagnosis Date  . Anxiety   . Arthritis    "bad; all over" (09/24/2014)  . Asthma   . Basal cell carcinoma    "cut and burned off  top of head and face"  . Chronic bronchitis ([place]    "got it q yr til this year" (09/24/2014)  . Chronic lower back pain   . Chronic sinusitis   . Coronary artery disease    LHC 10/15: pLAD 20-30, dLAD 50, origin D1 40; oLCx 40 then irregs; oRI 20, RCA diff irregs up to 10-20  . Degenerative arthritis   . Depression   . Echocardiograms    Echo 10/19: EF 55-60, no RWMA, mild AS (mean 14, peak 26), mild to mod AI, ascending aorta 40 mm, MAC, mild LAE, mild reduced RVSF, mild TR, PASP 43  . GERD (gastroesophageal reflux disease)   . Hypercholesterolemia   . Leg pain    ABIs 11/17: normal  . LV dysfunction    Echo 10/15: Mod LVH, EF 40-50, no apical clot, aortic sclerosis, MAC, trivial MR, mild LAE  . Nuclear stress test  Nuclear stress test 10/19: EF 46, no ischemia; intermediate risk  . OSA on CPAP   . PVC's (premature ventricular contractions)    "I inherited a 4th skip from my mother"  . Seasonal allergies     Past Surgical History:  Procedure Laterality Date  . ANTERIOR CERVICAL DECOMP/DISCECTOMY FUSION  1998  . ANTERIOR CERVICAL DISCECTOMY  1989  . APPENDECTOMY  1967  . BACK SURGERY    . BASAL CELL CARCINOMA EXCISION     "top of my head"  . CARPAL TUNNEL RELEASE Bilateral 1980's  . CATARACT EXTRACTION W/ INTRAOCULAR LENS  IMPLANT, BILATERAL Bilateral 2013  . CHOLECYSTECTOMY    . COLONOSCOPY    . CORONARY ANGIOPLASTY WITH STENT  PLACEMENT  ~ 1992  . EYE MUSCLE SURGERY Right 2013  . IR KYPHO LUMBAR INC FX REDUCE BONE BX UNI/BIL CANNULATION INC/IMAGING  01/23/2018  . JOINT REPLACEMENT    . LEFT HEART CATHETERIZATION WITH CORONARY ANGIOGRAM N/A 12/10/2013   Procedure: LEFT HEART CATHETERIZATION WITH CORONARY ANGIOGRAM;  Surgeon: Leonie Man, MD;  Location: [facility] CATH LAB;  Service: Cardiovascular;  Laterality: N/A;  . LUMBAR LAMINECTOMY/DECOMPRESSION MICRODISCECTOMY Bilateral 12/04/2012   Procedure: BILATERAL LUMBAR LAMINECTOMY/DECOMPRESSION MICRODISCECTOMY LUMBAR FOUR-TO FIVE SACRALONE;  Surgeon: Elaina Hoops, MD;  Location: [facility]URO ORS;  Service: Neurosurgery;  Laterality: Bilateral;  . REVISION TOTAL HIP ARTHROPLASTY Right 2002   "changed a ball"  . RIGHT/LEFT HEART CATH AND CORONARY ANGIOGRAPHY N/A 09/19/2018   Procedure: RIGHT/LEFT HEART CATH AND CORONARY ANGIOGRAPHY;  Surgeon: Leonie Man, MD;  Location: [facility] CV LAB;  Service: Cardiovascular;  Laterality: N/A;  . SHOULDER ARTHROSCOPY Bilateral    bone spurs  . TONSILLECTOMY  ~ 1947  . TOTAL HIP ARTHROPLASTY Bilateral 1989-1995   "right-left"  . TOTAL KNEE ARTHROPLASTY Left 09/24/2014  . TOTAL KNEE ARTHROPLASTY Left 09/24/2014   Procedure: TOTAL KNEE ARTHROPLASTY;  Surgeon: Melrose Nakayama, MD;  Location: [facility];  Service: Orthopedics;  Laterality: Left;     Current Outpatient Medications  Medication Sig Dispense Refill  . acetaminophen (TYLENOL) 500 MG tablet Take 500-1,000 mg by mouth every 6 (six) hours as needed for mild pain.    Marland Kitchen albuterol (VENTOLIN HFA) 108 (90 Base) MCG/ACT inhaler Inhale 2 puffs into the lungs every 6 (six) hours as needed.     Marland Kitchen alfuzosin (UROXATRAL) 10 MG 24 hr tablet Take 10 mg by mouth at bedtime.     Marland Kitchen aspirin EC 81 MG tablet Take 1 tablet (81 mg total) by mouth daily. (Patient taking differently: Take 81 mg by mouth at bedtime. ) 90 tablet 3  . atorvastatin (LIPITOR) 40 MG tablet TAKE 1 TABLET(40 MG) BY MOUTH DAILY 90 tablet  3  . Cetirizine HCl (ZYRTEC PO) Take by mouth as directed.    Marland Kitchen Dextromethorphan-Guaifenesin (MUCINEX DM MAXIMUM STRENGTH) 60-1200 MG TB12 Take 1 tablet by mouth daily.     Marland Kitchen EPINEPHrine (EPIPEN 2-PAK) 0.3 mg/0.3 mL IJ SOAJ injection Inject 0.3 mLs (0.3 mg total) into the muscle once. (Patient taking differently: Inject 0.3 mg into the muscle as needed for anaphylaxis. ) 2 Device 2  . FIBER ADULT GUMMIES PO Take 2 tablets by mouth daily.    . finasteride (PROSCAR) 5 MG tablet Take 5 mg by mouth at bedtime.     . furosemide (LASIX) 20 MG tablet Take 1 tablet (20 mg total) by mouth daily. 90 tablet 3  . gabapentin (NEURONTIN) 100 MG capsule Take 100 mg by mouth 2 (two) times  a day.     . ipratropium (ATROVENT) 0.06 % nasal spray Place 2 sprays into both nostrils 2 (two) times a day.     . isosorbide mononitrate (IMDUR) 30 MG 24 hr tablet Take 0.5 tablets (15 mg total) by mouth daily. Take 30 min after ASA 45 tablet 3  . memantine (NAMENDA) 10 MG tablet Take 10 mg by mouth 2 (two) times daily.     . metoprolol tartrate (LOPRESSOR) 25 MG tablet Take 0.5 tablets (12.5 mg total) by mouth daily. Take a 1 tablet in the morning and 0.5 tablet at night 45 tablet 1  . nitroGLYCERIN (NITROSTAT) 0.4 MG SL tablet PLACE 1 TABLET UNDER TONGUE EVERY 5 MINUTES FOR 3 DOSES FOR CHEST PAIN 25 tablet 3  . NON FORMULARY Place 3 drops under the tongue at bedtime. Allergy Drop sublingual immunotherapy (SLIT)    . omeprazole (PRILOSEC) 40 MG capsule Take 40 mg by mouth 2 (two) times daily.     Vladimir Faster Glycol-Propyl Glycol (SYSTANE) 0.4-0.3 % SOLN Place 1 drop into both eyes 3 (three) times daily as needed (dry/irritated eyes.).    Marland Kitchen traMADol (ULTRAM) 50 MG tablet Take 1 tablet (50 mg total) by mouth every 4 (four) hours as needed for moderate pain. 30 tablet 0  . venlafaxine XR (EFFEXOR-XR) 75 MG 24 hr capsule Take 75-150 capsules by mouth See admin instructions. Take 2 capsules (150 mg) by mouth in the morning & take 1  capsule (75 mg) by mouth at night.     No current facility-administered medications for this visit.    Allergies:   Penicillins, Bee venom, Donepezil, and Methocarbamol    Social History:  The patient  reports that he quit smoking about 59 years ago. His smoking use included cigarettes. He has a 4.00 pack-year smoking history. He has never used smokeless tobacco. He reports that he does not drink alcohol or use drugs.   Family History:  The patient's family history includes Cancer in his brother and mother.    ROS:  Please see the history of present illness. Otherwise, review of systems are positive for none.  All other systems are reviewed and negative.    PHYSICAL EXAM: VS:  BP 102/78   Pulse 60   Ht _0  (1.651 m)   Wt 246 lb (111.6 kg)   BMI 40.94 kg/m  , BMI Body mass index is 40.94 kg/m.   General: Obese, NAD Neck: Negative for carotid bruits. No JVD Lungs:Clear to ausculation bilaterally. Breathing is unlabored. Cardiovascular: RRR with S1 S2. Extremities: No edema. Radial pulses 2+ bilaterally Neuro: Alert and oriented. No focal deficits. No facial asymmetry. MAE spontaneously. Psych: Responds to questions appropriately with normal affect.    EKG:  EKG is not ordered today.  Recent Labs: 09/13/2018: BUN 18; Creatinine, Ser 1.07; Platelets 299 09/19/2018: Hemoglobin 12.9; Potassium 4.2; Sodium 140    Lipid Panel    Component Value Date/Time   CHOL 114 11/03/2018 1212   TRIG 110 11/03/2018 1212   HDL 46 11/03/2018 1212   CHOLHDL 2.5 11/03/2018 1212   CHOLHDL 3 02/05/2014 0843   VLDL 17.8 02/05/2014 0843   LDLCALC 48 11/03/2018 1212     Wt Readings from Last 3 Encounters:  06/05/19 246 lb (111.6 kg)  02/27/19 240 lb (108.9 kg)  11/03/18 240 lb (108.9 kg)    Other studies Reviewed: Additional studies/ records that were reviewed today include:  Cardiac catheterization 09/19/2018 LM mod calcified LAD mid 50, dist 65  RI 85 (not favorable for PCI) LCx  mildly calcified  RCA mildly calcified PA P 34/12 mmHg-mean 23 mmHg PCWP: 18 mmHg LV EDP is normal. LVP-EDP: 119/11 mmHg - 14 mmHg AOP-MAP: 106/71 mmHg - 87 mmHg Ao sat 98%, PA sat 63%. CARDIAC OUTPUT-INDEX ([facility]) 4.4-2.04 --moderately reduced Aortic valve mean gradient 10-16 mmHg. -Moderate stenosis  Echocardiogram 08/30/2018 EF 40-45, mild conc LVH, mod MAC, mild MR, severe AS (mean 11, peak 22), mild AI, mild TR  LT Cardiac Monitor 08/2018 Atrial fibrillation 53 to 169 bpm Average HR 99 bpm Frequent PVCs (10% total), occasional couplet Longest NSVT 5 beats  Echo 11/22/17 EF 55-60, no RWMA, mild AS (mean 14, peak 26), mild to mod AI, dilated ascending aorta (40 mm), MAC, mild LAE, mildly reduced RVSF, PASP 43  Myoview 11/18/17 EF 46, normal perfusion  Echo 10/15 Mod LVH, EF 40-50, no apical clot, aortic sclerosis, MAC, trivial MR, mild LAE  LHC 10/15 LAD prox 20-30, dist 50, D1 40 at takeoff LCx ostial 40, o/w irregs RI ostial 20 then min irregs RCA diff irregs 10-20  ASSESSMENT AND PLAN:  1.  CAD with recurrent/chronic angina: -Last seen 02/2019 with shoulder pain felt to be secondary to arthritis however also noted to have chronic exertional chest pain for which he was started on Imdur 15 mg p.o. daily with plans to uptitrate to Ranexa if needed. Continues to have chest pain, however he describes that it typically occurs in the setting of food ingestion. He states its a burning and heavy sensation with no associated symptoms. Appears to be GI in etiology. Has been referred to GI in the past and was told he needed an EGD however he deferred. I have recommended that he follow once again with them.  -BP rather low today at 102/78 therefore we discussed decreasing his BB to allow for more BP -Continue Imdur for now  -Continue ASA, statin, beta-blocker  2.  Aortic valve stenosis: -Moderate per cardiac catheterization May 2020 -Denies dizziness, syncope -Follow with serial  echocardiograms  3.  Chronic systolic CHF: -LVEF noted to be 40 to 45% -Has known chronic dyspnea with exertion felt to be multifactorial -Appears euvolemic on exam -Takes Lasix as needed>>not needed   4.  Permanent atrial fibrillation: -Rate controlled today  -Not a candidate for anticoagulation secondary to previous subarachnoid hemorrhage secondary to fall  5.  Essential hypertension: -Low today at 102/78 -Decrease metoprolol from 37.5 QD to 12.5 QD to allow for more BP   Current medicines are reviewed at length with the patient today.  The patient does not have concerns regarding medicines.  The following changes have been made:  Decrease metoprolol to 12.26m PO QD   Labs/ tests ordered today include: None  No orders of the defined types were placed in this encounter.   Disposition:   FU with Dr. RHarrington Challengerin 3 months  Signed, JKathyrn Drown NP  06/05/2019 3:03 PM    [facility] [address] [place]  [address]Phone: [phone] Fax: [phone]

## 2019-06-26 ENCOUNTER — Other Ambulatory Visit: Payer: Self-pay | Admitting: Cardiology

## 2019-07-02 ENCOUNTER — Ambulatory Visit: Payer: Medicare Other | Admitting: Pediatrics

## 2019-07-02 ENCOUNTER — Encounter: Payer: Self-pay | Admitting: Pediatrics

## 2019-07-02 ENCOUNTER — Other Ambulatory Visit: Payer: Self-pay

## 2019-07-02 VITALS — BP 98/58 | HR 101 | Temp 98.4°F | Resp 24 | Ht 65.6 in | Wt 245.0 lb

## 2019-07-02 DIAGNOSIS — J4530 Mild persistent asthma, uncomplicated: Secondary | ICD-10-CM | POA: Diagnosis not present

## 2019-07-02 DIAGNOSIS — L503 Dermatographic urticaria: Secondary | ICD-10-CM

## 2019-07-02 DIAGNOSIS — J301 Allergic rhinitis due to pollen: Secondary | ICD-10-CM | POA: Diagnosis not present

## 2019-07-02 DIAGNOSIS — K219 Gastro-esophageal reflux disease without esophagitis: Secondary | ICD-10-CM

## 2019-07-02 DIAGNOSIS — Z79899 Other long term (current) drug therapy: Secondary | ICD-10-CM | POA: Diagnosis not present

## 2019-07-02 DIAGNOSIS — I10 Essential (primary) hypertension: Secondary | ICD-10-CM

## 2019-07-02 DIAGNOSIS — T63481D Toxic effect of venom of other arthropod, accidental (unintentional), subsequent encounter: Secondary | ICD-10-CM

## 2019-07-02 DIAGNOSIS — I420 Dilated cardiomyopathy: Secondary | ICD-10-CM

## 2019-07-02 DIAGNOSIS — G4733 Obstructive sleep apnea (adult) (pediatric): Secondary | ICD-10-CM

## 2019-07-02 MED ORDER — MONTELUKAST SODIUM 10 MG PO TABS: ORAL_TABLET | ORAL | 5 refills | Status: DC

## 2019-07-02 MED ORDER — FLUTICASONE PROPIONATE 50 MCG/ACT NA SUSP
NASAL | 5 refills | Status: AC
Start: 2019-07-02 — End: ?

## 2019-07-02 NOTE — Progress Notes (Signed)
[facility] [address] Dept: [phone]  New Patient Note  Patient ID: Larry Church, male    DOB: Apr 08, 1932  Age: 86 y.o. MRN: [id number] Date of Office Visit: 07/02/2019 Referring provider: Lilian Coma., MD No address on file    Chief Complaint: Asthma (chest tightness)  HPI Larry Church presents for for an allergy evaluation.  He has a history of asthma and the use of albuterol does help any cough or chest congestion.  Over the past few months he has had a postnasal drainage and some fullness in the chest.  Albuterol does help.  He has a history of congestive heart failure and an irregular heartbeat.  He has a dilated cardiomyopathy.  He has gastroesophageal reflux and takes omeprazole 40 mg twice a day.  He has a history of seasonal allergic rhinitis for many years and has been on allergy injections in the past.  During the past 3 years , he has had sublingual drops for allergies but they do not seem to be helping him.  He has aggravation of his nasal symptoms on exposure to dust, cigarette smoke, cats and possibly dogs.  He has obstructive sleep apnea and uses BiPAP  About 5 years ago following a wasp sting he had hives and shortness of breath.  He has EpiPen to use in case of an insect sting.  He has had an itchy skin off and on.  The itching lasts a few hours.  He has not had eczema.  Review of Systems  Constitutional: Negative.   HENT:       Seasonal allergic rhinitis for many years which is worse in the spring and fall .  History of obstructive sleep apnea  Eyes:       Lens implants  Respiratory:       History of asthma better on Ventolin  Cardiovascular:       Hypertension, Irregular heartbeat, history of congestive heart failure with dilated cardiomyopathy  Gastrointestinal:       Gastroesophageal reflux .  Cholecystectomy and appendectomy  Genitourinary:       Enlarged prostate gland  Musculoskeletal:       Arthritis of his hips and legs,  back and neck .  He has had hip replacements and left knee replacement and surgery in his lower back and neck  Skin:       Itchy skin for several years that comes and goes and last for a few hours.  Removal of basal cell carcinoma  Neurological:       Poor balance .  Several lumbar and cervical surgeries  Endo/Heme/Allergies:       No diabetes or thyroid disease  Psychiatric/Behavioral: Negative.     Outpatient Encounter Medications as of 07/02/2019  Medication Sig  . acetaminophen (TYLENOL) 500 MG tablet Take 500-1,000 mg by mouth every 6 (six) hours as needed for mild pain.  Marland Kitchen albuterol (VENTOLIN HFA) 108 (90 Base) MCG/ACT inhaler Inhale 2 puffs into the lungs every 6 (six) hours as needed.   Marland Kitchen alfuzosin (UROXATRAL) 10 MG 24 hr tablet Take 10 mg by mouth at bedtime.   Marland Kitchen aspirin EC 81 MG tablet Take 1 tablet (81 mg total) by mouth daily. (Patient taking differently: Take 81 mg by mouth at bedtime. )  . atorvastatin (LIPITOR) 40 MG tablet TAKE 1 TABLET(40 MG) BY MOUTH DAILY  . azelastine (ASTELIN) 0.1 % nasal spray Place 1 spray into both nostrils daily. Use in each nostril as  directed  . Dextromethorphan-Guaifenesin (MUCINEX DM MAXIMUM STRENGTH) 60-1200 MG TB12 Take 1 tablet by mouth daily.   Marland Kitchen EPINEPHrine (EPIPEN 2-PAK) 0.3 mg/0.3 mL IJ SOAJ injection Inject 0.3 mLs (0.3 mg total) into the muscle once. (Patient taking differently: Inject 0.3 mg into the muscle as needed for anaphylaxis. )  . finasteride (PROSCAR) 5 MG tablet Take 5 mg by mouth at bedtime.   . gabapentin (NEURONTIN) 100 MG capsule Take 100 mg by mouth 2 (two) times a day.   Marland Kitchen guaiFENesin (MUCINEX) 600 MG 12 hr tablet Take by mouth 2 (two) times daily.  Marland Kitchen ipratropium (ATROVENT) 0.06 % nasal spray Place 2 sprays into both nostrils 2 (two) times a day.   . isosorbide mononitrate (IMDUR) 30 MG 24 hr tablet Take 1 tablet (30 mg total) by mouth daily.  Marland Kitchen levocetirizine (XYZAL) 5 MG tablet Take 5 mg by mouth every evening.  .  memantine (NAMENDA) 10 MG tablet Take 10 mg by mouth 2 (two) times daily.   . metoprolol tartrate (LOPRESSOR) 25 MG tablet Take 0.5 tablets (12.5 mg total) by mouth daily. Take a 1 tablet in the morning and 0.5 tablet at night  . nitroGLYCERIN (NITROSTAT) 0.4 MG SL tablet PLACE 1 TABLET UNDER TONGUE EVERY 5 MINUTES FOR 3 DOSES FOR CHEST PAIN  . NON FORMULARY Place 3 drops under the tongue at bedtime. Allergy Drop sublingual immunotherapy (SLIT)  . omeprazole (PRILOSEC) 40 MG capsule Take 40 mg by mouth 2 (two) times daily.   Vladimir Faster Glycol-Propyl Glycol (SYSTANE) 0.4-0.3 % SOLN Place 1 drop into both eyes 3 (three) times daily as needed (dry/irritated eyes.).  Marland Kitchen traMADol (ULTRAM) 50 MG tablet Take 1 tablet (50 mg total) by mouth every 4 (four) hours as needed for moderate pain.  Marland Kitchen triamcinolone cream (KENALOG) 0.1 % Apply 1 application topically 2 (two) times daily.  Marland Kitchen venlafaxine XR (EFFEXOR-XR) 75 MG 24 hr capsule Take 75-150 capsules by mouth See admin instructions. Take 2 capsules (150 mg) by mouth in the morning & take 1 capsule (75 mg) by mouth at night.  . fluticasone (FLONASE) 50 MCG/ACT nasal spray 2 sprays per nostril once a day if needed for stuffy nose use daily until end of june  . furosemide (LASIX) 20 MG tablet Take 1 tablet (20 mg total) by mouth daily.  . montelukast (SINGULAIR) 10 MG tablet Take 1 tablet once a day to prevent coughing or wheezing  . [DISCONTINUED] Cetirizine HCl (ZYRTEC PO) Take by mouth as directed.  . [DISCONTINUED] FIBER ADULT GUMMIES PO Take 2 tablets by mouth daily.   No facility-administered encounter medications on file as of 07/02/2019.     Drug Allergies:  Allergies  Allergen Reactions  . Penicillins Anaphylaxis and Swelling    Has patient had a PCN reaction causing immediate rash, facial/tongue/throat swelling, SOB or lightheadedness with hypotension: Yes Has patient had a PCN reaction causing severe rash involving mucus membranes or skin  necrosis: No Has patient had a PCN reaction that required hospitalization: No Has patient had a PCN reaction occurring within the last 10 years: No If all of the above answers are "NO", then may proceed with Cephalosporin use.  . Bee Venom Swelling  . Donepezil Other (See Comments)    Vertigo  . Methocarbamol     Tiredness    Family History: Larry Church's family history includes Cancer in his brother and mother..  Family history is positive for hayfever and sinus problems and food allergies..  Family history is  negative for eczema, chronic urticaria, , chronic bronchitis or emphysema.  Social and environmental.  There are no pets in the home.  He is not exposed to cigarette smoking.  He quit smoking cigarettes in 1962 after smoking for 12 years averaging a pack a day.  He is retired   Physical Exam: BP (!) 98/58 (BP Location: Right Arm, Patient Position: Sitting, Cuff Size: Normal)   Pulse (!) 101   Temp 98.4 F (36.9 C) (Oral)   Resp (!) 24   Ht 5' 5.6" (1.666 m)   Wt 245 lb (111.1 kg)   SpO2 92%   BMI 40.03 kg/m    Physical Exam Vitals reviewed.  Constitutional:      Appearance: Normal appearance. He is obese.  HENT:     Head:     Comments: Eyes normal.  Ears normal.  Nose moderate swelling of nasal turbinates.  Pharynx normal. Neck:     Comments: No thyromegaly Cardiovascular:     Rate and Rhythm: Rhythm irregular.     Comments: S1-S2 normal no murmurs Pulmonary:     Comments: Clear to percussion and auscultation Abdominal:     Palpations: Abdomen is soft.     Tenderness: There is no abdominal tenderness.     Comments: No hepatosplenomegaly  Musculoskeletal:     Cervical back: Neck supple.  Lymphadenopathy:     Cervical: No cervical adenopathy.  Skin:    Comments: Clear but mild dermographia noted  Neurological:     General: No focal deficit present.     Mental Status: He is alert and oriented to person, place, and time. Mental status is at baseline.    Psychiatric:        Mood and Affect: Mood normal.        Behavior: Behavior normal.        Thought Content: Thought content normal.        Judgment: Judgment normal.     Diagnostics: Allergy skin test were extremely positive to grass pollens.  Mild reactivity to ragweed and minimal reactivity to some molds.   Assessment  Assessment and Plan: 1. Mild persistent asthma without complication   2. Seasonal allergic rhinitis due to pollen   3. Current use of beta blocker   4. Dilated cardiomyopathy ([place])   5. Morbid obesity ([place])   6. Gastroesophageal reflux disease without esophagitis   7. Essential hypertension   8. Dermographia   9. Allergic reaction to insect sting, accidental or unintentional, subsequent encounter   10. Obstructive sleep apnea treated with bilevel positive airway pressure (BiPAP)     Meds ordered this encounter  Medications  . fluticasone (FLONASE) 50 MCG/ACT nasal spray    Sig: 2 sprays per nostril once a day if needed for stuffy nose use daily until end of june    Dispense:  18.2 mL    Refill:  5  . montelukast (SINGULAIR) 10 MG tablet    Sig: Take 1 tablet once a day to prevent coughing or wheezing    Dispense:  30 tablet    Refill:  5    Patient Instructions  Environmental control of dust and mold Zyrtec 10 mg-take 1 tablet once a day if needed for runny nose or itchy eyes or itching Fluticasone 2 sprays per nostril once a day if needed for stuffy nose.  Use daily until the end of June  Montelukast 10 mg-take 1 tablet once a day to prevent coughing or wheezing.  It may help with your  allergy symptoms Ventolin 2 puffs every 4 hours if needed for wheezing or coughing spells.  You may use Ventolin 2 puffs 5 to 15 minutes before exercise  In the end of June you may use fluticasone and montelukast as needed  If you have an insect sting take Benadryl 50 mg every 4 hours and if you have life-threatening symptoms inject with EpiPen 0.3 mg  Continue on   your other medication  Call us if you are not doing well on this treatment plan   Return in about 6 weeks (around 08/13/2019).   Thank you for the opportunity to care for this patient.  Please do not hesitate to contact me with questions.  Penne Lash, M.D.  [facility] [address] [place], [place] [address] [phone]

## 2019-07-02 NOTE — Patient Instructions (Addendum)
Environmental control of dust and mold Zyrtec 10 mg-take 1 tablet once a day if needed for runny nose or itchy eyes or itching Fluticasone 2 sprays per nostril once a day if needed for stuffy nose.  Use daily until the end of June  Montelukast 10 mg-take 1 tablet once a day to prevent coughing or wheezing.  It may help with your allergy symptoms Ventolin 2 puffs every 4 hours if needed for wheezing or coughing spells.  You may use Ventolin 2 puffs 5 to 15 minutes before exercise  In the end of June you may use fluticasone and montelukast as needed  If you have an insect sting take Benadryl 50 mg every 4 hours and if you have life-threatening symptoms inject with EpiPen 0.3 mg  Continue on  your other medication  Call us if you are not doing well on this treatment plan

## 2019-08-23 ENCOUNTER — Ambulatory Visit: Payer: Medicare Other | Admitting: Allergy and Immunology

## 2019-09-13 NOTE — Progress Notes (Signed)
Cardiology Office Note   Date:  09/14/2019   ID:  Larry Church, DOB 24-Sep-1932, MRN [id number]  PCP:  Lilian Coma., MD  Cardiologist:   Dorris Carnes, MD   F/U of CAD   History of Present Illness: Larry Church is a 86 y.o. male with a history of CAD   L heart cath in 2015 with severe RI and mod prox mid LAD dz  Plan for medical RX   Echo in July 2020 LVEF 40 to 45%, PAF(not on anticoagualtion due to fall risk and Hx SAH), moderfs AS, AI, HTN, HL and PVCs  I last saw the pt in Sept 2020  He was seen by [name] in Jan  Complained of some bilateral arm pain and chest discomfort  Felt more arthritic in origin  Last seen in cardiology in  April 2021 by [name]   Complained of some chest symptoms that appeared more GI in origin  The pt denies CP    Breathing is stable     He has been seen in Ortho  With injections says his shoulder symptoms are improved   He has not fallen   He and wife say he is relatively steady on transfers from chair    Current Meds  Medication Sig  . acetaminophen (TYLENOL) 500 MG tablet Take 500-1,000 mg by mouth every 6 (six) hours as needed for mild pain.  Marland Kitchen albuterol (VENTOLIN HFA) 108 (90 Base) MCG/ACT inhaler Inhale 2 puffs into the lungs every 6 (six) hours as needed.   Marland Kitchen alfuzosin (UROXATRAL) 10 MG 24 hr tablet Take 10 mg by mouth at bedtime.   Marland Kitchen aspirin EC 81 MG tablet Take 1 tablet (81 mg total) by mouth daily. (Patient taking differently: Take 81 mg by mouth at bedtime. )  . atorvastatin (LIPITOR) 40 MG tablet TAKE 1 TABLET(40 MG) BY MOUTH DAILY  . azelastine (ASTELIN) 0.1 % nasal spray Place 1 spray into both nostrils daily. Use in each nostril as directed  . Dextromethorphan-Guaifenesin (MUCINEX DM MAXIMUM STRENGTH) 60-1200 MG TB12 Take 1 tablet by mouth daily.   Marland Kitchen EPINEPHrine (EPIPEN 2-PAK) 0.3 mg/0.3 mL IJ SOAJ injection Inject 0.3 mLs (0.3 mg total) into the muscle once. (Patient taking differently: Inject 0.3 mg into the muscle as needed for  anaphylaxis. )  . finasteride (PROSCAR) 5 MG tablet Take 5 mg by mouth at bedtime.   . fluticasone (FLONASE) 50 MCG/ACT nasal spray 2 sprays per nostril once a day if needed for stuffy nose use daily until end of june  . gabapentin (NEURONTIN) 100 MG capsule Take 100 mg by mouth 2 (two) times a day.   Marland Kitchen guaiFENesin (MUCINEX) 600 MG 12 hr tablet Take by mouth 2 (two) times daily.  Marland Kitchen ipratropium (ATROVENT) 0.06 % nasal spray Place 2 sprays into both nostrils 2 (two) times a day.   . isosorbide mononitrate (IMDUR) 30 MG 24 hr tablet Take 1 tablet (30 mg total) by mouth daily.  Marland Kitchen levocetirizine (XYZAL) 5 MG tablet Take 5 mg by mouth every evening.  . memantine (NAMENDA) 10 MG tablet Take 10 mg by mouth 2 (two) times daily.   . metoprolol tartrate (LOPRESSOR) 25 MG tablet Take 0.5 tablets (12.5 mg total) by mouth daily. Take a 1 tablet in the morning and 0.5 tablet at night  . nitroGLYCERIN (NITROSTAT) 0.4 MG SL tablet PLACE 1 TABLET UNDER TONGUE EVERY 5 MINUTES FOR 3 DOSES FOR CHEST PAIN  . NON FORMULARY Place  3 drops under the tongue at bedtime. Allergy Drop sublingual immunotherapy (SLIT)  . omeprazole (PRILOSEC) 40 MG capsule Take 40 mg by mouth 2 (two) times daily.   Vladimir Faster Glycol-Propyl Glycol (SYSTANE) 0.4-0.3 % SOLN Place 1 drop into both eyes 3 (three) times daily as needed (dry/irritated eyes.).  Marland Kitchen traMADol (ULTRAM) 50 MG tablet Take 1 tablet (50 mg total) by mouth every 4 (four) hours as needed for moderate pain.  Marland Kitchen triamcinolone cream (KENALOG) 0.1 % Apply 1 application topically 2 (two) times daily.  Marland Kitchen venlafaxine XR (EFFEXOR-XR) 75 MG 24 hr capsule Take 75-150 capsules by mouth See admin instructions. Take 2 capsules (150 mg) by mouth in the morning & take 1 capsule (75 mg) by mouth at night.     Allergies:   Penicillins, Bee venom, Donepezil, and Methocarbamol   Past Medical History:  Diagnosis Date  . Anxiety   . Arthritis    "bad; all over" (09/24/2014)  . Asthma   . Basal  cell carcinoma    "cut and burned off  top of head and face"  . Chronic bronchitis ([place])    "got it q yr til this year" (09/24/2014)  . Chronic lower back pain   . Chronic sinusitis   . Coronary artery disease    LHC 10/15: pLAD 20-30, dLAD 50, origin D1 40; oLCx 40 then irregs; oRI 20, RCA diff irregs up to 10-20  . Degenerative arthritis   . Depression   . Echocardiograms    Echo 10/19: EF 55-60, no RWMA, mild AS (mean 14, peak 26), mild to mod AI, ascending aorta 40 mm, MAC, mild LAE, mild reduced RVSF, mild TR, PASP 43  . GERD (gastroesophageal reflux disease)   . Hypercholesterolemia   . Leg pain    ABIs 11/17: normal  . LV dysfunction    Echo 10/15: Mod LVH, EF 40-50, no apical clot, aortic sclerosis, MAC, trivial MR, mild LAE  . Nuclear stress test    Nuclear stress test 10/19: EF 46, no ischemia; intermediate risk  . OSA on CPAP   . PVC's (premature ventricular contractions)    "I inherited a 4th skip from my mother"  . Seasonal allergies     Past Surgical History:  Procedure Laterality Date  . ANTERIOR CERVICAL DECOMP/DISCECTOMY FUSION  1998  . ANTERIOR CERVICAL DISCECTOMY  1989  . APPENDECTOMY  1967  . BACK SURGERY    . BASAL CELL CARCINOMA EXCISION     "top of my head"  . CARPAL TUNNEL RELEASE Bilateral 1980's  . CATARACT EXTRACTION W/ INTRAOCULAR LENS  IMPLANT, BILATERAL Bilateral 2013  . CHOLECYSTECTOMY    . COLONOSCOPY    . CORONARY ANGIOPLASTY WITH STENT PLACEMENT  ~ 1992  . EYE MUSCLE SURGERY Right 2013  . IR KYPHO LUMBAR INC FX REDUCE BONE BX UNI/BIL CANNULATION INC/IMAGING  01/23/2018  . JOINT REPLACEMENT    . LEFT HEART CATHETERIZATION WITH CORONARY ANGIOGRAM N/A 12/10/2013   Procedure: LEFT HEART CATHETERIZATION WITH CORONARY ANGIOGRAM;  Surgeon: Leonie Man, MD;  Location: [facility] CATH LAB;  Service: Cardiovascular;  Laterality: N/A;  . LUMBAR LAMINECTOMY/DECOMPRESSION MICRODISCECTOMY Bilateral 12/04/2012   Procedure: BILATERAL LUMBAR  LAMINECTOMY/DECOMPRESSION MICRODISCECTOMY LUMBAR FOUR-TO FIVE SACRALONE;  Surgeon: Elaina Hoops, MD;  Location: [place] NEURO ORS;  Service: Neurosurgery;  Laterality: Bilateral;  . REVISION TOTAL HIP ARTHROPLASTY Right 2002   "changed a ball"  . RIGHT/LEFT HEART CATH AND CORONARY ANGIOGRAPHY N/A 09/19/2018   Procedure: RIGHT/LEFT HEART CATH AND CORONARY ANGIOGRAPHY;  Surgeon: Leonie Man, MD;  Location: [place] CV LAB;  Service: Cardiovascular;  Laterality: N/A;  . SHOULDER ARTHROSCOPY Bilateral    bone spurs  . TONSILLECTOMY  ~ 1947  . TOTAL HIP ARTHROPLASTY Bilateral 1989-1995   "right-left"  . TOTAL KNEE ARTHROPLASTY Left 09/24/2014  . TOTAL KNEE ARTHROPLASTY Left 09/24/2014   Procedure: TOTAL KNEE ARTHROPLASTY;  Surgeon: Melrose Nakayama, MD;  Location: [place];  Service: Orthopedics;  Laterality: Left;     Social History:  The patient  reports that he quit smoking about 59 years ago. His smoking use included cigarettes. He has a 4.00 pack-year smoking history. He has never used smokeless tobacco. He reports that he does not drink alcohol and does not use drugs.   Family History:  The patient's family history includes Cancer in his brother and mother.    ROS:  Please see the history of present illness. All other systems are reviewed and  Negative to the above problem except as noted.    PHYSICAL EXAM: VS:  BP 116/76   Pulse 84   Ht 5' 5.6" (1.666 m)   Wt (!) 232 lb (105.2 kg)   SpO2 97%   BMI 37.90 kg/m   GEN: Morbidly obese 86 yo  in no acute distress  HEENT: normal  Neck: no JVD, carotid bruits, Cardiac:Irrreg irreg   No S3    Gr II/VI systolic murmur  Tr LE edema  Respiratory:  clear to auscultation bilaterally GI: soft, nontender, obese  MS: no deformity Moving all extremities   Skin: warm and dry, no rash Neuro:  Strength and sensation are intact Psych: euthymic mood, full affect   EKG:  EKG is ordered today.  Atrial fibrilllation 84 bpm   Occasional PVC  CARDIAC  STUDIES:  Cardiac catheterization 09/19/2018 LM mod calcified LAD mid 50, dist 65 RI 85 (not favorable for PCI) LCx mildly calcified  RCA mildly calcified PA P 34/12 mmHg-mean 23 mmHg PCWP: 18 mmHg LV EDP is normal. LVP-EDP: 119/11 mmHg - 14 mmHg AOP-MAP: 106/71 mmHg - 87 mmHg Ao sat 98%, PA sat 63%. CARDIAC OUTPUT-INDEX ([facility]) 4.4-2.04 --moderately reduced Aortic valve mean gradient 10-16 mmHg. -Moderate stenosis  Echocardiogram 08/30/2018 EF 40-45, mild conc LVH, mod MAC, mild MR, severe AS (mean 11, peak 22), mild AI, mild TR  LT Cardiac Monitor 08/2018 Atrial fibrillation 53 to 169 bpm Average HR 99 bpm Frequent PVCs (10% total), occasional couplet Longest NSVT 5 beats  Echo 11/22/17 EF 55-60, no RWMA, mild AS (mean 14, peak 26), mild to mod AI, dilated ascending aorta (40 mm), MAC, mild LAE, mildly reduced RVSF, PASP 43  Myoview 11/18/17 EF 46, normal perfusion  Echo 10/15 Mod LVH, EF 40-50, no apical clot, aortic sclerosis, MAC, trivial MR, mild LAE  LHC 10/15 LAD prox 20-30, dist 50, D1 40 at takeoff LCx ostial 40, o/w irregs RI ostial 20 then min irregs RCA diff irregs 10-20    Lipid Panel    Component Value Date/Time   CHOL 114 11/03/2018 1212   TRIG 110 11/03/2018 1212   HDL 46 11/03/2018 1212   CHOLHDL 2.5 11/03/2018 1212   CHOLHDL 3 02/05/2014 0843   VLDL 17.8 02/05/2014 0843   LDLCALC 48 11/03/2018 1212      Wt Readings from Last 3 Encounters:  09/14/19 (!) 232 lb (105.2 kg)  07/02/19 245 lb (111.1 kg)  06/05/19 246 lb (111.6 kg)      ASSESSMENT AND PLAN:  1.  CAD Pt with  known CAD with cath  In Aug 2020 results as above   Significant dz of ramus not amenable to intervention  Moderate dz elsewhere   Currently pt is  without CP   Doing better with shoulder injection    2  AS/AI  Moderate at cath   I would recomm repeat echo before his next visit    3  Chronic systolic CHF  Last echo in July LVEF 40 to 45%   Exam difficult given patient's  size but it does appear to be up some  Discussed salt intake and limiting   Could use lasix 20 mg a few times per week if needed    BP has been marginal in previous visits making addition of other meds difficult    4  Atrial fibrillation  Permaent    Per discussion with wife and patient he has not fallen and wife says he is relatively steady on transsfer   5  HL  Last LDL in September 2020   LDL 48  HDL 46    Continue meds       Current medicines are reviewed at length with the patient today.  The patient does not have concerns regarding medicines.  Signed, Dorris Carnes, MD  09/14/2019 1:48 PM    [facility] [address], [place], [place]  [address] Phone: [phone]; Fax: [phone]

## 2019-09-14 ENCOUNTER — Ambulatory Visit: Payer: Medicare Other | Admitting: Internal Medicine

## 2019-09-14 ENCOUNTER — Encounter: Payer: Self-pay | Admitting: Internal Medicine

## 2019-09-14 ENCOUNTER — Other Ambulatory Visit: Payer: Self-pay

## 2019-09-14 VITALS — BP 116/76 | HR 84 | Ht 65.6 in | Wt 232.0 lb

## 2019-09-14 DIAGNOSIS — I350 Nonrheumatic aortic (valve) stenosis: Secondary | ICD-10-CM

## 2019-09-14 DIAGNOSIS — I5022 Chronic systolic (congestive) heart failure: Secondary | ICD-10-CM | POA: Diagnosis not present

## 2019-09-14 MED ORDER — FUROSEMIDE 20 MG PO TABS
20.0000 mg | ORAL_TABLET | ORAL | 3 refills | Status: DC
Start: 2019-09-14 — End: 2019-12-13

## 2019-09-14 NOTE — Patient Instructions (Addendum)
Medication Instructions:  Use lasix 20 mg as needed up to three times a week Decrease salt in diet  *If you need a refill on your cardiac medications before your next appointment, please call your pharmacy*   Lab Work: none If you have labs (blood work) drawn today and your tests are completely normal, you will receive your results only by: Marland Kitchen MyChart Message (if you have MyChart) OR . A paper copy in the mail If you have any lab test that is abnormal or we need to change your treatment, we will call you to review the results.   Testing/Procedures: echo due in January before next appointment   Your physician has requested that you have an echocardiogram. Echocardiography is a painless test that uses sound waves to create images of your heart. It provides your doctor with information about the size and shape of your heart and how well your heart's chambers and valves are working. This procedure takes approximately one hour. There are no restrictions for this procedure.   Follow-Up: At [facility], you and your health needs are our priority.  As part of our continuing mission to provide you with exceptional heart care, we have created designated Provider Care Teams.  These Care Teams include your primary Cardiologist (physician) and Advanced Practice Providers (APPs -  Physician Assistants and Nurse Practitioners) who all work together to provide you with the care you need, when you need it.  Your next appointment:   6 month(s)  The format for your next appointment:   In Person  Provider:   You may see Dorris Carnes, MD or one of the following Advanced Practice Providers on your designated Care Team:    Richardson Dopp, PA-C  Robbie Lis, PA-C    Other Instructions  Labs and office visit from PCP is in Care Everywhere.

## 2019-09-15 ENCOUNTER — Telehealth: Payer: Self-pay | Admitting: Internal Medicine

## 2019-09-15 DIAGNOSIS — I4821 Permanent atrial fibrillation: Secondary | ICD-10-CM

## 2019-09-15 NOTE — Telephone Encounter (Signed)
Reivewed with pt at visit to office   No falls or near falls recently  Need most recent labs (CBC, BMET)   Will tentatively  plan for Eliquis for atrial fibrrllation

## 2019-09-17 MED ORDER — APIXABAN 5 MG PO TABS
5.0000 mg | ORAL_TABLET | Freq: Two times a day (BID) | ORAL | 11 refills | Status: DC
Start: 2019-09-17 — End: 2019-12-13

## 2019-09-17 NOTE — Telephone Encounter (Signed)
CBC done in march 2021 Hgb was normal Electrolytes and kidney function normal Start Eliquis 5 mg bid     F/U CBC in 1 month

## 2019-09-17 NOTE — Telephone Encounter (Signed)
Spoke with patient's wife.  Pt will start Eliquis 5 mg bid and return to office for CBC in one month. Appointment made.

## 2019-09-17 NOTE — Addendum Note (Signed)
Addended by: Rodman Key on: 09/17/2019 03:17 PM   Modules accepted: Orders

## 2019-09-17 NOTE — Telephone Encounter (Signed)
Please see labs in Care Everywhere.  [facility].  09/13/19.

## 2019-09-25 ENCOUNTER — Other Ambulatory Visit: Payer: Self-pay | Admitting: Internal Medicine

## 2019-09-25 DIAGNOSIS — I2510 Atherosclerotic heart disease of native coronary artery without angina pectoris: Secondary | ICD-10-CM

## 2019-09-25 DIAGNOSIS — E7800 Pure hypercholesterolemia, unspecified: Secondary | ICD-10-CM

## 2019-10-17 ENCOUNTER — Other Ambulatory Visit: Payer: Medicare Other | Admitting: *Deleted

## 2019-10-17 ENCOUNTER — Other Ambulatory Visit: Payer: Self-pay

## 2019-10-17 DIAGNOSIS — I4821 Permanent atrial fibrillation: Secondary | ICD-10-CM

## 2019-10-17 LAB — CBC
Hematocrit: 40 % (ref 37.5–51.0)
Hemoglobin: 13.1 g/dL (ref 13.0–17.7)
MCH: 32.4 pg (ref 26.6–33.0)
MCHC: 32.8 g/dL (ref 31.5–35.7)
MCV: 99 fL — ABNORMAL HIGH (ref 79–97)
Platelets: 318 10*3/uL (ref 150–450)
RBC: 4.04 x10E6/uL — ABNORMAL LOW (ref 4.14–5.80)
RDW: 14.7 % (ref 11.6–15.4)
WBC: 10.1 10*3/uL (ref 3.4–10.8)

## 2019-10-19 ENCOUNTER — Telehealth: Payer: Self-pay | Admitting: Internal Medicine

## 2019-10-19 NOTE — Telephone Encounter (Signed)
Returned call to patient/wife.  She was asking about results of CBC.  Adv per Dr. Harrington Challenger CBC is normal and no changes are recommended.  He will continue on Eliquis.

## 2019-10-19 NOTE — Telephone Encounter (Signed)
Patient's wife called an needs to speak with Dr. Harrington Challenger or nurse because she wants to know if he should stop taking Eliquis or not

## 2019-11-06 ENCOUNTER — Telehealth: Payer: Self-pay | Admitting: *Deleted

## 2019-11-06 NOTE — Telephone Encounter (Signed)
Patient with diagnosis of afib on Eliquis for anticoagulation.    Procedure: 1 dental extraction Date of procedure: TBD  CHADS2-VASc score of 5 (age x2, CHF, HTN, CAD)  CrCl 22mL/min using adjusted body weight Platelet count 318K  Per office protocol, patient can hold Eliquis for 1 day prior to procedure if needed.

## 2019-11-06 NOTE — Telephone Encounter (Signed)
Pharmacy please addressed anticoagulation in this patient, it sounds like this will be an extensive surgical tooth extraction.  I will contact the patient with your recommendations and for clearance.  Kerin Ransom PA-C 11/06/2019 1:51 PM

## 2019-11-06 NOTE — Telephone Encounter (Signed)
   Primary Cardiologist: Dorris Carnes, MD  Chart reviewed and patient contacted today by phone (I spoke with [name]) as part of pre-operative protocol coverage. Given past medical history and time since last visit, based on ACC/AHA guidelines, Larry Church would be at acceptable risk for the planned procedure without further cardiovascular testing.   OK to hold Eliquis one day pre op and aspirin 3 days pre op.  Resume as soon as safe post op.  The patient was advised that if he develops new symptoms prior to surgery to contact our office to arrange for a follow-up visit, and he verbalized understanding.  I will route this recommendation to the requesting party via Epic fax function and remove from pre-op pool.  Please call with questions.  Kerin Ransom, PA-C 11/06/2019, 3:43 PM

## 2019-11-06 NOTE — Telephone Encounter (Signed)
   [facility] Pre-operative Risk Assessment    HEARTCARE STAFF: - Please ensure there is not already an duplicate clearance open for this procedure. - Under Visit Info/Reason for Call, type in Other and utilize the format Clearance MM/DD/YY or Clearance TBD. Do not use dashes or single digits. - If request is for dental extraction, please clarify the # of teeth to be extracted.  Request for surgical clearance:  1. What type of surgery is being performed? 1 TOOTH TO BE SURGICALLY EXTRACTED  2. When is this surgery scheduled? TBD  3. What type of clearance is required (medical clearance vs. Pharmacy clearance to hold med vs. Both)? BOTH  4. Are there any medications that need to be held prior to surgery and how long? ELIQUIS  5. Practice name and name of physician performing surgery? [facility]; DR. Berniece Pap, DR. Joneen Caraway and DR. Milford Cage   6. What is the office phone number? [phone]   7.   What is the office fax number? [phone]  8.   Anesthesia type (None, local, MAC, general) ? LOCAL   Julaine Hua 11/06/2019, 11:56 AM  _________________________________________________________________   (provider comments below)

## 2019-11-07 ENCOUNTER — Emergency Department (HOSPITAL_COMMUNITY)
Admission: EM | Admit: 2019-11-07 | Discharge: 2019-11-08 | Disposition: A | Discharge status: Home / Self Care | Payer: Medicare Other | Attending: Emergency Medicine | Admitting: Emergency Medicine

## 2019-11-07 ENCOUNTER — Emergency Department (HOSPITAL_COMMUNITY): Payer: Medicare Other

## 2019-11-07 ENCOUNTER — Other Ambulatory Visit (HOSPITAL_COMMUNITY): Payer: Self-pay | Admitting: Radiology

## 2019-11-07 DIAGNOSIS — I110 Hypertensive heart disease with heart failure: Secondary | ICD-10-CM | POA: Insufficient documentation

## 2019-11-07 DIAGNOSIS — Y92009 Unspecified place in unspecified non-institutional (private) residence as the place of occurrence of the external cause: Secondary | ICD-10-CM | POA: Diagnosis not present

## 2019-11-07 DIAGNOSIS — Z7901 Long term (current) use of anticoagulants: Secondary | ICD-10-CM | POA: Insufficient documentation

## 2019-11-07 DIAGNOSIS — Z79899 Other long term (current) drug therapy: Secondary | ICD-10-CM | POA: Diagnosis not present

## 2019-11-07 DIAGNOSIS — N289 Disorder of kidney and ureter, unspecified: Secondary | ICD-10-CM

## 2019-11-07 DIAGNOSIS — I2510 Atherosclerotic heart disease of native coronary artery without angina pectoris: Secondary | ICD-10-CM | POA: Insufficient documentation

## 2019-11-07 DIAGNOSIS — S300XXA Contusion of lower back and pelvis, initial encounter: Secondary | ICD-10-CM | POA: Insufficient documentation

## 2019-11-07 DIAGNOSIS — W19XXXA Unspecified fall, initial encounter: Secondary | ICD-10-CM | POA: Insufficient documentation

## 2019-11-07 DIAGNOSIS — I5022 Chronic systolic (congestive) heart failure: Secondary | ICD-10-CM | POA: Diagnosis not present

## 2019-11-07 DIAGNOSIS — Z7902 Long term (current) use of antithrombotics/antiplatelets: Secondary | ICD-10-CM | POA: Diagnosis not present

## 2019-11-07 DIAGNOSIS — D7589 Other specified diseases of blood and blood-forming organs: Secondary | ICD-10-CM

## 2019-11-07 LAB — I-STAT CHEM 8, ED
BUN: 37 mg/dL — ABNORMAL HIGH (ref 8–23)
Calcium, Ion: 1.26 mmol/L (ref 1.15–1.40)
Chloride: 102 mmol/L (ref 98–111)
Creatinine, Ser: 1.2 mg/dL (ref 0.61–1.24)
Glucose, Bld: 131 mg/dL — ABNORMAL HIGH (ref 70–99)
HCT: 43 % (ref 39.0–52.0)
Hemoglobin: 14.6 g/dL (ref 13.0–17.0)
Potassium: 4.2 mmol/L (ref 3.5–5.1)
Sodium: 135 mmol/L (ref 135–145)
TCO2: 24 mmol/L (ref 22–32)

## 2019-11-07 LAB — COMPREHENSIVE METABOLIC PANEL
ALT: 47 U/L — ABNORMAL HIGH (ref 0–44)
AST: 40 U/L (ref 15–41)
Albumin: 3 g/dL — ABNORMAL LOW (ref 3.5–5.0)
Alkaline Phosphatase: 92 U/L (ref 38–126)
Anion gap: 8 (ref 5–15)
BUN: 33 mg/dL — ABNORMAL HIGH (ref 8–23)
CO2: 24 mmol/L (ref 22–32)
Calcium: 9.5 mg/dL (ref 8.9–10.3)
Chloride: 103 mmol/L (ref 98–111)
Creatinine, Ser: 1.28 mg/dL — ABNORMAL HIGH (ref 0.61–1.24)
GFR calc Af Amer: 59 mL/min — ABNORMAL LOW (ref >60)
GFR calc non Af Amer: 51 mL/min — ABNORMAL LOW (ref >60)
Glucose, Bld: 136 mg/dL — ABNORMAL HIGH (ref 70–99)
Potassium: 4.2 mmol/L (ref 3.5–5.1)
Sodium: 135 mmol/L (ref 135–145)
Total Bilirubin: 0.8 mg/dL (ref 0.3–1.2)
Total Protein: 6.8 g/dL (ref 6.5–8.1)

## 2019-11-07 LAB — ETHANOL: Alcohol, Ethyl (B): <10 mg/dL (ref <10)

## 2019-11-07 LAB — CBC
HCT: 43.6 % (ref 39.0–52.0)
Hemoglobin: 14.1 g/dL (ref 13.0–17.0)
MCH: 33.8 pg (ref 26.0–34.0)
MCHC: 32.3 g/dL (ref 30.0–36.0)
MCV: 104.6 fL — ABNORMAL HIGH (ref 80.0–100.0)
Platelets: 274 10*3/uL (ref 150–400)
RBC: 4.17 MIL/uL — ABNORMAL LOW (ref 4.22–5.81)
RDW: 16.3 % — ABNORMAL HIGH (ref 11.5–15.5)
WBC: 12.8 10*3/uL — ABNORMAL HIGH (ref 4.0–10.5)
nRBC: 0 % (ref 0.0–0.2)

## 2019-11-07 LAB — LACTIC ACID, PLASMA: Lactic Acid, Venous: 1.8 mmol/L (ref 0.5–1.9)

## 2019-11-07 LAB — SAMPLE TO BLOOD BANK

## 2019-11-07 LAB — PROTIME-INR
INR: 1.5 — ABNORMAL HIGH (ref 0.8–1.2)
Prothrombin Time: 17.5 seconds — ABNORMAL HIGH (ref 11.4–15.2)

## 2019-11-07 NOTE — Progress Notes (Signed)
Orthopedic Tech Progress Note Patient Details:  Larry Church [birth year] [id number] Level 2 Trauma  Patient ID: Larry Church, male   DOB: December 24, 1932, 86 y.o.   MRN: [id number]   [name] 11/07/2019, 11:10 PM

## 2019-11-07 NOTE — ED Provider Notes (Signed)
[place] EMERGENCY DEPARTMENT Provider Note   CSN: [id number] Arrival date & time: 11/07/19  2255   History Chief Complaint  Patient presents with  . Fall    Larry Church is a 86 y.o. male.  The history is provided by the patient.  Fall  He has history of hypertension, hyperlipidemia, coronary artery disease, chronic systolic heart failure, traumatic subarachnoid hemorrhage, atrial fibrillation anticoagulated on apixaban and comes in following a fall at home.  He was using his walker when he fell striking the back of his head against a dresser and landing on his back.  He has chronic back pain which is somewhat worse now.  Is also complaining of pain in his right sacral area.  He denies any pain in his head.  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  He was transported by EMS as a level 2 trauma because of a fall while on anticoagulants.  No past medical history on file.  There are no problems to display for this patient.   ** The histories are not reviewed yet. Please review them in the "History" navigator section and refresh this Nunez.     No family history on file.  Social History   Tobacco Use  . Smoking status: Not on file  Substance Use Topics  . Alcohol use: Not on file  . Drug use: Not on file    Home Medications Prior to Admission medications   Not on File    Allergies    Patient has no allergy information on record.  Review of Systems   Review of Systems  All other systems reviewed and are negative.   Physical Exam Updated Vital Signs BP 117/84   Pulse (!) 110   Temp (!) 97 F (36.1 C)   Resp (!) 30   Ht 5\' 5"  (1.651 m)   Wt 71.2 kg   SpO2 94%   BMI 26.13 kg/m   Physical Exam Vitals and nursing note reviewed.   86 year old male, resting comfortably and in no acute distress. Vital signs are significant for mildly elevated heart rate and elevated respiratory rate. Oxygen  saturation is 94%, which is normal. Head is normocephalic and atraumatic. PERRLA, EOMI. Oropharynx is clear. Neck is immobilized in a stiff cervical collar and is nontender without adenopathy or JVD. Back has mild tenderness in the the lumbar area in the midline and right paralumbar area.  There is tenderness along the soft tissues around the right posterior iliac crest.  There is no CVA tenderness. Lungs are clear without rales, wheezes, or rhonchi. Chest is nontender. Heart has an irregular rhythm without murmur. Abdomen is soft, flat, nontender without masses or hepatosplenomegaly and peristalsis is normoactive. Extremities have trace edema, full range of motion is present. Skin is warm and dry without rash. Neurologic: Mental status is normal, cranial nerves are intact, there are no motor or sensory deficits.   ED Results / Procedures / Treatments   Labs (all labs ordered are listed, but only abnormal results are displayed) Labs Reviewed  COMPREHENSIVE METABOLIC PANEL - Abnormal; Notable for the following components:      Result Value   Glucose, Bld 136 (*)    BUN 33 (*)    Creatinine, Ser 1.28 (*)    Albumin 3.0 (*)    ALT 47 (*)    GFR calc non Af Amer 51 (*)    GFR calc Af Amer 59 (*)  All other components within normal limits  CBC - Abnormal; Notable for the following components:   WBC 12.8 (*)    RBC 4.17 (*)    MCV 104.6 (*)    RDW 16.3 (*)    All other components within normal limits  PROTIME-INR - Abnormal; Notable for the following components:   Prothrombin Time 17.5 (*)    INR 1.5 (*)    All other components within normal limits  I-STAT CHEM 8, ED - Abnormal; Notable for the following components:   BUN 37 (*)    Glucose, Bld 131 (*)    All other components within normal limits  SARS CORONAVIRUS 2 BY RT PCR (HOSPITAL ORDER, [place] [facility])  ETHANOL  LACTIC ACID, PLASMA  URINALYSIS, ROUTINE W REFLEX MICROSCOPIC  SAMPLE TO BLOOD BANK     EKG EKG Interpretation  Date/Time:  Wednesday November 07 2019 23:01:27 EDT Ventricular Rate:  100 PR Interval:    QRS Duration: 103 QT Interval:  380 QTC Calculation: 491 R Axis:   -43 Text Interpretation: Atrial fibrillation Ventricular premature complex Abnormal R-wave progression, late transition Left ventricular hypertrophy Borderline T abnormalities, inferior leads Borderline prolonged QT interval No old tracing to compare Confirmed by Delora Fuel ([id number]) on 11/07/2019 11:05:23 PM   Radiology DG Lumbar Spine Complete  Result Date: 11/07/2019 CLINICAL DATA:  Status post fall. EXAM: LUMBAR SPINE - COMPLETE 4+ VIEW COMPARISON:  January 08, 2018 FINDINGS: There is no evidence of an acute lumbar spine fracture. Chronic compression fracture deformities are seen at the levels of L1 and L2 vertebral bodies. Subsequent vertebroplasty of the L1 vertebral body is also noted. Alignment is normal. There is moderate to marked severity multilevel endplate sclerosis. Mild-to-moderate severity multilevel intervertebral disc space narrowing is present. Bilateral total hip replacements are seen. IMPRESSION: 1. No acute findings. 2. Chronic compression fracture deformities of the L1 and L2 vertebral bodies with prior vertebroplasty of the L1 vertebral body. 3. Marked severity multilevel degenerative changes. Electronically Signed   By: Virgina Norfolk M.D.   On: 11/07/2019 23:57   CT HEAD WO CONTRAST  Result Date: 11/08/2019 CLINICAL DATA:  Fall, head injury, chronic anticoagulation EXAM: CT HEAD WITHOUT CONTRAST CT CERVICAL SPINE WITHOUT CONTRAST TECHNIQUE: Multidetector CT imaging of the head and cervical spine was performed following the standard protocol without intravenous contrast. Multiplanar CT image reconstructions of the cervical spine were also generated. COMPARISON:  CT head 02/19/2014 FINDINGS: CT HEAD FINDINGS Brain: Normal anatomic configuration. Parenchymal volume loss is commensurate  with the patient's age. Mild periventricular white matter changes are present likely reflecting the sequela of small vessel ischemia. Remote lacunar infarcts are noted within the basal ganglia bilaterally. No abnormal intra or extra-axial mass lesion or fluid collection. No abnormal mass effect or midline shift. No evidence of acute intracranial hemorrhage or infarct. Ventricular size is normal. Cerebellum unremarkable. Vascular: No asymmetric hyperdense vasculature at the skull base. Extensive atherosclerotic calcification is seen within the carotid siphons, basilar artery, terminal vertebral arteries. Skull: Intact Sinuses/Orbits: Paranasal sinuses are clear. Orbits are unremarkable. Other: Mastoid air cells and middle ear cavities are clear. CT CERVICAL SPINE FINDINGS Alignment: Anterior cervical discectomy and fusion of C3-4 has been performed without instrumentation with solid fusion of the vertebral bodies. Anterior cervical discectomy and fusion with instrumentation of C5-6 has been performed with solid fusion of the vertebral bodies. There is grade 1 anterolisthesis of C4 upon C5, likely degenerative in nature. Similarly, minimal anterolisthesis of C2 upon C3  is likely degenerative in nature. Skull base and vertebrae: The craniocervical junction is unremarkable. Atlantodental interval is not widened. No acute fracture of the cervical spine. No lytic or blastic bone lesion. Soft tissues and spinal canal: No prevertebral fluid or swelling. No visible canal hematoma. Disc levels: Review of the sagittal reformats demonstrates advanced degenerative changes at C1-2. There is diffuse severe intervertebral disc space narrowing and endplate remodeling throughout the cervical spine in keeping with changes of diffuse degenerative disc disease. Review of the axial images demonstrates multilevel advanced uncovertebral and facet arthrosis resulting in severe left neural foraminal narrowing at C2-3, severe bilateral neural  foraminal narrowing at C4-5 and severe bilateral neural foraminal narrowing at C7-T1. There is mild central canal stenosis at C4-5 secondary to anterolisthesis in combination with uncovertebral and facet arthrosis. Posterior disc herniation results in moderate central canal stenosis at C2-3. Upper chest: Fibrotic changes noted.  No pneumothorax. Other: Extensive calcification noted within the carotid bifurcations bilaterally. IMPRESSION: No acute intracranial injury.  No calvarial fracture. No acute cervical spine fracture. Advanced multilevel degenerative disc and degenerative joint disease with resultant multilevel neural foraminal narrowing as described above. Electronically Signed   By: Fidela Salisbury MD   On: 11/08/2019 00:30   CT CERVICAL SPINE WO CONTRAST  Result Date: 11/08/2019 CLINICAL DATA:  Fall, head injury, chronic anticoagulation EXAM: CT HEAD WITHOUT CONTRAST CT CERVICAL SPINE WITHOUT CONTRAST TECHNIQUE: Multidetector CT imaging of the head and cervical spine was performed following the standard protocol without intravenous contrast. Multiplanar CT image reconstructions of the cervical spine were also generated. COMPARISON:  CT head 02/19/2014 FINDINGS: CT HEAD FINDINGS Brain: Normal anatomic configuration. Parenchymal volume loss is commensurate with the patient's age. Mild periventricular white matter changes are present likely reflecting the sequela of small vessel ischemia. Remote lacunar infarcts are noted within the basal ganglia bilaterally. No abnormal intra or extra-axial mass lesion or fluid collection. No abnormal mass effect or midline shift. No evidence of acute intracranial hemorrhage or infarct. Ventricular size is normal. Cerebellum unremarkable. Vascular: No asymmetric hyperdense vasculature at the skull base. Extensive atherosclerotic calcification is seen within the carotid siphons, basilar artery, terminal vertebral arteries. Skull: Intact Sinuses/Orbits: Paranasal sinuses are  clear. Orbits are unremarkable. Other: Mastoid air cells and middle ear cavities are clear. CT CERVICAL SPINE FINDINGS Alignment: Anterior cervical discectomy and fusion of C3-4 has been performed without instrumentation with solid fusion of the vertebral bodies. Anterior cervical discectomy and fusion with instrumentation of C5-6 has been performed with solid fusion of the vertebral bodies. There is grade 1 anterolisthesis of C4 upon C5, likely degenerative in nature. Similarly, minimal anterolisthesis of C2 upon C3 is likely degenerative in nature. Skull base and vertebrae: The craniocervical junction is unremarkable. Atlantodental interval is not widened. No acute fracture of the cervical spine. No lytic or blastic bone lesion. Soft tissues and spinal canal: No prevertebral fluid or swelling. No visible canal hematoma. Disc levels: Review of the sagittal reformats demonstrates advanced degenerative changes at C1-2. There is diffuse severe intervertebral disc space narrowing and endplate remodeling throughout the cervical spine in keeping with changes of diffuse degenerative disc disease. Review of the axial images demonstrates multilevel advanced uncovertebral and facet arthrosis resulting in severe left neural foraminal narrowing at C2-3, severe bilateral neural foraminal narrowing at C4-5 and severe bilateral neural foraminal narrowing at C7-T1. There is mild central canal stenosis at C4-5 secondary to anterolisthesis in combination with uncovertebral and facet arthrosis. Posterior disc herniation results in moderate central canal  stenosis at C2-3. Upper chest: Fibrotic changes noted.  No pneumothorax. Other: Extensive calcification noted within the carotid bifurcations bilaterally. IMPRESSION: No acute intracranial injury.  No calvarial fracture. No acute cervical spine fracture. Advanced multilevel degenerative disc and degenerative joint disease with resultant multilevel neural foraminal narrowing as  described above. Electronically Signed   By: Fidela Salisbury MD   On: 11/08/2019 00:30   DG Chest Port 1 View  Result Date: 11/07/2019 CLINICAL DATA:  Pain status post fall EXAM: PORTABLE CHEST 1 VIEW COMPARISON:  January 16, 2018 FINDINGS: The patient is rotated which limits evaluation. The heart appears enlarged. There is an opacity projecting over the left mid and left lower lung zone. Coarse bilateral interstitial lung markings are noted. There are atherosclerotic changes of the thoracic aorta. There is no acute displaced fracture. There appear to be old healed left-sided rib fractures. IMPRESSION: 1. Suboptimal evaluation secondary to patient rotation. There is opacification of the left mid and lower lung zones which is felt to be secondary to patient rotation. Atelectasis or infiltrate is difficult to exclude at this location. 2. Cardiomegaly. 3. Again noted are findings consistent with interstitial lung disease as previously described. Electronically Signed   By: Constance Holster M.D.   On: 11/07/2019 23:34   DG Knee Right Port  Result Date: 11/07/2019 CLINICAL DATA:  Pain status post fall EXAM: PORTABLE RIGHT KNEE - 1-2 VIEW COMPARISON:  None. FINDINGS: There are moderate tricompartmental degenerative changes. There is no acute displaced fracture or dislocation. No joint effusion. There are advanced vascular calcifications. IMPRESSION: 1. No acute displaced fracture or dislocation. 2. Moderate tricompartmental degenerative changes. 3. Advanced vascular calcifications. Electronically Signed   By: Constance Holster M.D.   On: 11/07/2019 23:35   DG Ankle Right Port  Result Date: 11/07/2019 CLINICAL DATA:  Pain EXAM: PORTABLE RIGHT ANKLE - 2 VIEW COMPARISON:  None. FINDINGS: There is nonspecific soft tissue swelling about the lower extremity and ankle. There is no acute displaced fracture. No dislocation. Vascular calcifications are noted. IMPRESSION: No acute displaced fracture or dislocation.  Nonspecific soft tissue swelling about the lower extremity and ankle. Electronically Signed   By: Constance Holster M.D.   On: 11/07/2019 23:36   DG HIP UNILAT WITH PELVIS 2-3 VIEWS RIGHT  Result Date: 11/07/2019 CLINICAL DATA:  Pain status post fall EXAM: DG HIP (WITH OR WITHOUT PELVIS) 2-3V RIGHT COMPARISON:  None. FINDINGS: The patient is status post bilateral total hip arthroplasty. There is no acute displaced fracture or dislocation. The hardware appears grossly intact. IMPRESSION: Negative. Electronically Signed   By: Constance Holster M.D.   On: 11/07/2019 23:32    Procedures Procedures  CRITICAL CARE Performed by: Delora Fuel Total critical care time: 45 minutes Critical care time was exclusive of separately billable procedures and treating other patients. Critical care was necessary to treat or prevent imminent or life-threatening deterioration. Critical care was time spent personally by me on the following activities: development of treatment plan with patient and/or surrogate as well as nursing, discussions with consultants, evaluation of patient's response to treatment, examination of patient, obtaining history from patient or surrogate, ordering and performing treatments and interventions, ordering and review of laboratory studies, ordering and review of radiographic studies, pulse oximetry and re-evaluation of patient's condition.  Medications Ordered in ED Medications - No data to display  ED Course  I have reviewed the triage vital signs and the nursing notes.  Pertinent labs & imaging results that were available during my care of  the patient were reviewed by me and considered in my medical decision making (see chart for details).  MDM Rules/Calculators/A&P Ground-level fall and patient anticoagulated on apixaban with no obvious significant injury on exam.  Because of anticoagulated state, he will be sent for CT of head and cervical spine.  Plain films being obtained of  lumbar spine, right lower extremity.  Old records are reviewed confirming history of traumatic subarachnoid hemorrhage in 2016, chronic anticoagulation for permanent atrial fibrillation.  Labs show mild renal insufficiency, macrocytosis without anemia.  X-rays and CT scans are negative for acute injury.  He is discharged with instructions to apply ice to sore areas, use acetaminophen as needed for pain.  Follow-up with PCP.  Final Clinical Impression(s) / ED Diagnoses Final diagnoses:  Fall  Fall at home, initial encounter  Contusion of lower back, initial encounter  Chronic anticoagulation    Rx / DC Orders ED Discharge Orders    None       Delora Fuel, MD 34/37/35 [id number]

## 2019-11-07 NOTE — ED Triage Notes (Signed)
Pt comes from home via [facility], walking to the bathroom with his walker, fell back and hit his head on dresser, on Eliquis, no LOC, c/o of lower back pain that is chronic but worse now

## 2019-11-07 NOTE — ED Provider Notes (Signed)
MSE was initiated and I personally evaluated the patient and placed orders (if any) at  11:09 PM on November 07, 2019.  The patient appears stable so that the remainder of the MSE may be completed by another provider.  This is a level 2 fall on blood thinners, MSE performed by myself, labs and imaging placed, oncoming provider will take over for full assessment and evaluation  Pt care was handed off to on coming provider at 2300.  Complete history and physical and current plan have been communicated.  Please refer to their note for the remainder of ED care and ultimate disposition.  Pt seen in conjunction with Dr. Samule Ohm, Lenoria Chime, MD 11/07/19 [phone]

## 2019-11-08 ENCOUNTER — Emergency Department (HOSPITAL_COMMUNITY): Payer: Medicare Other

## 2019-11-08 ENCOUNTER — Other Ambulatory Visit (HOSPITAL_COMMUNITY): Payer: Medicare Other

## 2019-11-08 NOTE — Discharge Instructions (Signed)
Apply ice to sore areas as needed.  You may take acetaminophen as needed for pain.

## 2019-11-21 ENCOUNTER — Other Ambulatory Visit: Payer: Self-pay

## 2019-11-21 ENCOUNTER — Emergency Department (HOSPITAL_COMMUNITY)
Admission: EM | Admit: 2019-11-21 | Discharge: 2019-11-22 | Disposition: A | Discharge status: Home / Self Care | Payer: Medicare Other | Attending: Emergency Medicine | Admitting: Emergency Medicine

## 2019-11-21 DIAGNOSIS — I2510 Atherosclerotic heart disease of native coronary artery without angina pectoris: Secondary | ICD-10-CM | POA: Diagnosis not present

## 2019-11-21 DIAGNOSIS — Z951 Presence of aortocoronary bypass graft: Secondary | ICD-10-CM | POA: Insufficient documentation

## 2019-11-21 DIAGNOSIS — Z7982 Long term (current) use of aspirin: Secondary | ICD-10-CM | POA: Diagnosis not present

## 2019-11-21 DIAGNOSIS — F0390 Unspecified dementia without behavioral disturbance: Secondary | ICD-10-CM | POA: Insufficient documentation

## 2019-11-21 DIAGNOSIS — I110 Hypertensive heart disease with heart failure: Secondary | ICD-10-CM | POA: Insufficient documentation

## 2019-11-21 DIAGNOSIS — R059 Cough, unspecified: Secondary | ICD-10-CM | POA: Diagnosis present

## 2019-11-21 DIAGNOSIS — J4520 Mild intermittent asthma, uncomplicated: Secondary | ICD-10-CM | POA: Diagnosis not present

## 2019-11-21 DIAGNOSIS — Z79899 Other long term (current) drug therapy: Secondary | ICD-10-CM | POA: Insufficient documentation

## 2019-11-21 DIAGNOSIS — Z7901 Long term (current) use of anticoagulants: Secondary | ICD-10-CM | POA: Insufficient documentation

## 2019-11-21 DIAGNOSIS — R040 Epistaxis: Secondary | ICD-10-CM | POA: Diagnosis not present

## 2019-11-21 DIAGNOSIS — Z87891 Personal history of nicotine dependence: Secondary | ICD-10-CM | POA: Insufficient documentation

## 2019-11-21 DIAGNOSIS — Z96652 Presence of left artificial knee joint: Secondary | ICD-10-CM | POA: Insufficient documentation

## 2019-11-21 DIAGNOSIS — I5043 Acute on chronic combined systolic (congestive) and diastolic (congestive) heart failure: Secondary | ICD-10-CM | POA: Insufficient documentation

## 2019-11-21 NOTE — ED Provider Notes (Signed)
[facility] DEPT Provider Note   CSN: [id number] Arrival date & time: 11/21/19  2319     History Chief Complaint  Patient presents with  . Epistaxis    Larry Church is a 86 y.o. male.  History difficult to obtain secondary to dementia and EMS being gone at the time of my evaluation.  Saw that the patient started having left-sided epistaxis with some postnasal drainage about 4 hours ago.  On EMS arrival he was hypertensive and normal heart rate.  States he had been coughing some which she always does and started having bleeding.  He is on Eliquis for A. fib.  They gave him some Afrin and then held pressure for 30 minutes.  Initially had hemostasis but then patient coughed and dislodge the clot and started bleeding again.  Was brought here for further evaluation.  The time my evaluation is no bleeding.  No history of trauma.   Epistaxis Location:  L nare Severity:  Mild      Past Medical History:  Diagnosis Date  . Anxiety   . Arthritis    "bad; all over" (09/24/2014)  . Asthma   . Basal cell carcinoma    "cut and burned off  top of head and face"  . Chronic bronchitis ([place])    "got it q yr til this year" (09/24/2014)  . Chronic lower back pain   . Chronic sinusitis   . Coronary artery disease    LHC 10/15: pLAD 20-30, dLAD 50, origin D1 40; oLCx 40 then irregs; oRI 20, RCA diff irregs up to 10-20  . Degenerative arthritis   . Depression   . Echocardiograms    Echo 10/19: EF 55-60, no RWMA, mild AS (mean 14, peak 26), mild to mod AI, ascending aorta 40 mm, MAC, mild LAE, mild reduced RVSF, mild TR, PASP 43  . GERD (gastroesophageal reflux disease)   . Hypercholesterolemia   . Leg pain    ABIs 11/17: normal  . LV dysfunction    Echo 10/15: Mod LVH, EF 40-50, no apical clot, aortic sclerosis, MAC, trivial MR, mild LAE  . Nuclear stress test    Nuclear stress test 10/19: EF 46, no ischemia; intermediate risk  . OSA on CPAP   . PVC's  (premature ventricular contractions)    "I inherited a 4th skip from my mother"  . Seasonal allergies     Patient Active Problem List   Diagnosis Date Noted  . Moderate aortic stenosis by prior echocardiography 09/19/2018  . Dilated cardiomyopathy ([place]) 09/19/2018  . DOE (dyspnea on exertion) 09/19/2018  . Acute on chronic combined systolic and diastolic CHF (congestive heart failure) ([place]) 09/19/2018  . Longstanding persistent atrial fibrillation ([place]) 01/14/2018  . Abdominal pain, right lower quadrant 01/14/2018  . Mild cognitive impairment 01/14/2018  . BPH (benign prostatic hyperplasia) 01/14/2018  . Macrocytosis 01/14/2018  . Mild intermittent asthma 01/16/2015  . Obstructive sleep apnea treated with bilevel positive airway pressure (BiPAP) 01/16/2015  . Gastroesophageal reflux disease without esophagitis 01/16/2015  . Current use of beta blocker 01/16/2015  . Coughing 01/06/2015  . Primary osteoarthritis of left knee 09/24/2014  . Primary osteoarthritis of knee 09/24/2014  . Traumatic subarachnoid hemorrhage ([place]) 02/20/2014  . CAD (coronary artery disease) 12/25/2013  . Essential hypertension 12/25/2013  . Morbid obesity ([place]) 12/25/2013  . Dyslipidemia 12/11/2013  . Fatigue 10/31/2013  . Screening for lipoid disorders 10/31/2013  . Obstructive apnea 10/31/2013  . Cervical pain 09/26/2013  . Beat,  premature ventricular 09/13/2013  . Low back pain 09/10/2013  . 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 DDD (degenerative disc disease), lumbosacral 09/10/2013  . Acid reflux 09/10/2013  . History of colon polyps 09/10/2013  . Lumbar canal stenosis 09/10/2013  . Depression, major, recurrent ([place]) 09/10/2013  . Arthritis, degenerative 09/10/2013  . Atrophic kidney 09/10/2013  . Compressed spine fracture ([place]) 09/10/2013    Past Surgical History:  Procedure Laterality Date  . ANTERIOR CERVICAL DECOMP/DISCECTOMY FUSION  1998  . ANTERIOR CERVICAL DISCECTOMY  1989  . APPENDECTOMY   1967  . BACK SURGERY    . BASAL CELL CARCINOMA EXCISION     "top of my head"  . CARPAL TUNNEL RELEASE Bilateral 1980's  . CATARACT EXTRACTION W/ INTRAOCULAR LENS  IMPLANT, BILATERAL Bilateral 2013  . CHOLECYSTECTOMY    . COLONOSCOPY    . CORONARY ANGIOPLASTY WITH STENT PLACEMENT  ~ 1992  . EYE MUSCLE SURGERY Right 2013  . IR KYPHO LUMBAR INC FX REDUCE BONE BX UNI/BIL CANNULATION INC/IMAGING  01/23/2018  . JOINT REPLACEMENT    . LEFT HEART CATHETERIZATION WITH CORONARY ANGIOGRAM N/A 12/10/2013   Procedure: LEFT HEART CATHETERIZATION WITH CORONARY ANGIOGRAM;  Surgeon: Leonie Man, MD;  Location[facility] C[facility] LAB;  Service: Cardiovascular;  Laterality: N/A;  . LUMBAR LAMINECTOMY/DECOMPRESSION MICRODISCECTOMY Bilateral 12/04/2012   Procedure: BILATERAL LUMBAR LAMINECTOMY/DECOMPRESSION MICRODISCECTOMY LUMBAR FOUR-TO FIVE SACRALONE;  Surgeon: Elaina Hoops, MD;  Location: [facility] NEURO ORS;  Service: Neurosurgery;  Laterality: Bilateral;  . REVISION TOTAL HIP ARTHROPLASTY Right 2002   "changed a ball"  . RIGHT/LEFT HEART CATH AND CORONARY ANGIOGRAPHY N/A 09/19/2018   Procedure: RIGHT/LEFT HEART CATH AND CORONARY ANGIOGRAPHY;  Surgeon: Leonie Man, MD;  Location: [facility] CV LAB;  Service: Cardiovascular;  Laterality: N/A;  . SHOULDER ARTHROSCOPY Bilateral    bone spurs  . TONSILLECTOMY  ~ 1947  . TOTAL HIP ARTHROPLASTY Bilateral 1989-1995   "right-left"  . TOTAL KNEE ARTHROPLASTY Left 09/24/2014  . TOTAL KNEE ARTHROPLASTY Left 09/24/2014   Procedure: TOTAL KNEE ARTHROPLASTY;  Surgeon: Melrose Nakayama, MD;  Location: [facility];  Service: Orthopedics;  Laterality: Left;       Family History  Problem Relation Age of Onset  . Cancer Mother   . Cancer Brother   . Heart attack Neg Hx   . Stroke Neg Hx   . Hypertension Neg Hx     Social History   Tobacco Use  . Smoking status: Former Smoker    Packs/day: 1.00    Years: 4.00    Pack years: 4.00    Types: Cigarettes    Quit date: 02/16/1960     Years since quitting: 59.8  . Smokeless tobacco: Never Used  Vaping Use  . Vaping Use: Never used  Substance Use Topics  . Alcohol use: No  . Drug use: No    Home Medications Prior to Admission medications   Medication Sig Start Date End Date Taking? Authorizing Provider  acetaminophen (TYLENOL) 500 MG tablet Take 500-1,000 mg by mouth every 6 (six) hours as needed for mild pain.    [provider]  acetaminophen (TYLENOL) 500 MG tablet Take 500-1,000 mg by mouth every 6 (six) hours as needed for moderate pain or headache.     [provider]  albuterol (VENTOLIN HFA) 108 (90 Base) MCG/ACT inhaler Inhale 2 puffs into the lungs every 6 (six) hours as needed.  05/14/19   [provider]  albuterol (VENTOLIN HFA) 108 (90 Base) MCG/ACT inhaler Inhale  2 puffs into the lungs every 6 (six) hours as needed for wheezing or shortness of breath.    [provider]  alfuzosin (UROXATRAL) 10 MG 24 hr tablet Take 10 mg by mouth at bedtime.  06/30/17   [provider]  alfuzosin (UROXATRAL) 10 MG 24 hr tablet Take 10 mg by mouth daily with breakfast.    [provider]  apixaban (ELIQUIS) 5 MG TABS tablet Take 1 tablet (5 mg total) by mouth 2 (two) times daily. 09/17/19   Fay Records, MD  apixaban (ELIQUIS) 5 MG TABS tablet Take 5 mg by mouth 2 (two) times daily.    [provider]  aspirin EC 81 MG tablet Take 1 tablet (81 mg total) by mouth daily. Patient taking differently: Take 81 mg by mouth at bedtime.  03/17/17   Fay Records, MD  aspirin EC 81 MG tablet Take 81 mg by mouth daily. Swallow whole.    [provider]  atorvastatin (LIPITOR) 40 MG tablet TAKE 1 TABLET(40 MG) BY MOUTH DAILY 09/26/19   Fay Records, MD  atorvastatin (LIPITOR) 40 MG tablet Take 40 mg by mouth daily.    [provider]  azelastine (ASTELIN) 0.1 % nasal spray Place 1 spray into both nostrils daily. Use in each nostril as directed    [provider]  azelastine (ASTELIN) 0.1 % nasal spray Place 1 spray into both nostrils daily. Use in each nostril as directed    [provider]  Dextromethorphan-Guaifenesin (MUCINEX DM MAXIMUM STRENGTH) 60-1200 MG TB12 Take 1 tablet by mouth daily.     [provider]  EPINEPHrine (EPIPEN 2-PAK) 0.3 mg/0.3 mL IJ SOAJ injection Inject 0.3 mLs (0.3 mg total) into the muscle once. Patient taking differently: Inject 0.3 mg into the muscle as needed for anaphylaxis.  11/25/14   Charlies Silvers, MD  EPINEPHrine 0.3 mg/0.3 mL IJ SOAJ injection Inject 0.3 mg into the muscle as needed for anaphylaxis.    [provider]  finasteride (PROSCAR) 5 MG tablet Take 5 mg by mouth at bedtime.     [provider]  finasteride (PROSCAR) 5 MG tablet Take 5 mg by mouth daily.    [provider]  fluticasone Asencion Islam) 50 MCG/ACT nasal spray 2 sprays per nostril once a day if needed for stuffy nose use daily until end of june 07/02/19   Charlies Silvers, MD  fluticasone (FLONASE) 50 MCG/ACT nasal spray Place 2 sprays into both nostrils daily as needed for allergies or rhinitis.    [provider]  furosemide (LASIX) 20 MG tablet Take 1 tablet (20 mg total) by mouth as directed. No more than 3 times a week as needed 09/14/19   Fay Records, MD  furosemide (LASIX) 20 MG tablet Take 20 mg by mouth See admin instructions. 3 times weekly as needed    [provider]  gabapentin (NEURONTIN) 100 MG capsule Take 100 mg by mouth 2 (two) times a day.  04/28/18   [provider]  gabapentin (NEURONTIN) 100 MG capsule Take 100 mg by mouth 2 (two) times daily.    [provider]  guaiFENesin (MUCINEX) 600 MG 12 hr tablet Take by mouth 2 (two) times daily.    [provider]  guaiFENesin (MUCINEX) 600 MG 12 hr tablet Take 600 mg by mouth 2 (two) times daily.    [provider]  ipratropium (ATROVENT) 0.06 % nasal spray Place 2 sprays  into both  nostrils 2 (two) times a day.  08/15/18   [provider]  ipratropium (ATROVENT) 0.06 % nasal spray Place 2 sprays into both nostrils every 12 (twelve) hours.    [provider]  isosorbide mononitrate (IMDUR) 30 MG 24 hr tablet Take 1 tablet (30 mg total) by mouth daily. 06/26/19   Richardson Dopp T, PA-C  isosorbide mononitrate (IMDUR) 30 MG 24 hr tablet Take 30 mg by mouth daily.    [provider]  levocetirizine (XYZAL) 5 MG tablet Take 5 mg by mouth every evening.    [provider]  levocetirizine (XYZAL) 5 MG tablet Take 5 mg by mouth every evening.    [provider]  memantine (NAMENDA) 10 MG tablet Take 10 mg by mouth 2 (two) times daily.  08/17/17   [provider]  memantine (NAMENDA) 10 MG tablet Take 10 mg by mouth 2 (two) times daily.    [provider]  metoprolol tartrate (LOPRESSOR) 25 MG tablet Take 0.5 tablets (12.5 mg total) by mouth daily. Take a 1 tablet in the morning and 0.5 tablet at night 06/05/19   Kathyrn Drown D, NP  metoprolol tartrate (LOPRESSOR) 25 MG tablet Take 12.5 mg by mouth 2 (two) times daily.    [provider]  nitroGLYCERIN (NITROSTAT) 0.4 MG SL tablet PLACE 1 TABLET UNDER TONGUE EVERY 5 MINUTES FOR 3 DOSES FOR CHEST PAIN 01/15/19   Richardson Dopp T, PA-C  nitroGLYCERIN (NITROSTAT) 0.4 MG SL tablet Place 0.4 mg under the tongue every 5 (five) minutes as needed for chest pain.    [provider]  NON FORMULARY Place 3 drops under the tongue at bedtime. Allergy Drop sublingual immunotherapy (SLIT)    [provider]  omeprazole (PRILOSEC) 40 MG capsule Take 40 mg by mouth 2 (two) times daily.  09/02/17   [provider]  omeprazole (PRILOSEC) 40 MG capsule Take 40 mg by mouth in the morning and at bedtime.    [provider]  Polyethyl Glycol-Propyl Glycol (SYSTANE) 0.4-0.3 % SOLN Place 1 drop into both eyes 3 (three) times daily as needed  (dry/irritated eyes.).    [provider]  Polyethyl Glycol-Propyl Glycol (SYSTANE) 0.4-0.3 % SOLN Place 1 drop into both eyes 3 (three) times daily as needed (dry eyes).    [provider]  traMADol (ULTRAM) 50 MG tablet Take 1 tablet (50 mg total) by mouth every 4 (four) hours as needed for moderate pain. 01/19/18   Charlynne Cousins, MD  traMADol (ULTRAM) 50 MG tablet Take 50 mg by mouth every 4 (four) hours as needed for severe pain.    [provider]  triamcinolone cream (KENALOG) 0.1 % Apply 1 application topically 2 (two) times daily.    [provider]  triamcinolone cream (KENALOG) 0.1 % Apply 1 application topically 2 (two) times daily as needed (irritation).    [provider]  venlafaxine XR (EFFEXOR-XR) 75 MG 24 hr capsule Take 75-150 capsules by mouth See admin instructions. Take 2 capsules (150 mg) by mouth in the morning & take 1 capsule (75 mg) by mouth at night. 07/14/16   [provider]  venlafaxine XR (EFFEXOR-XR) 75 MG 24 hr capsule Take 75-150 mg by mouth See admin instructions. 150 mg in the morning, 75 mg at bedtime    [provider]    Allergies    Penicillins, Penicillins, Bee venom, Donepezil, and Methocarbamol  Review of Systems   Review of Systems  Unable to  perform ROS: Dementia  HENT: Positive for nosebleeds.     Physical Exam Updated Vital Signs BP (!) 128/96   Pulse (!) 110   Temp 97.8 F (36.6 C) (Oral)   Resp 20   SpO2 96%   Physical Exam Vitals and nursing note reviewed.  Constitutional:      Appearance: He is well-developed.  HENT:     Head: Normocephalic and atraumatic.     Comments: Minimal posterior blood, no obvious active bleeding, but difficult to tell with the blood already there    Nose:     Comments: L nare with evidence of previous bleeding Cardiovascular:     Rate and Rhythm: Normal rate.  Pulmonary:     Effort: Pulmonary effort is normal. No respiratory distress.   Abdominal:     General: There is no distension.  Musculoskeletal:        General: Normal range of motion.     Cervical back: Normal range of motion.  Neurological:     Mental Status: He is alert.     ED Results / Procedures / Treatments   Labs (all labs ordered are listed, but only abnormal results are displayed) Labs Reviewed  CBC WITH DIFFERENTIAL/PLATELET - Abnormal; Notable for the following components:      Result Value   RBC 3.82 (*)    HCT 38.8 (*)    MCV 101.6 (*)    MCH 34.3 (*)    RDW 15.8 (*)    Abs Immature Granulocytes 0.20 (*)    All other components within normal limits  BASIC METABOLIC PANEL - Abnormal; Notable for the following components:   Glucose, Bld 116 (*)    BUN 28 (*)    All other components within normal limits  PROTIME-INR    EKG None  Radiology No results found.  Procedures Procedures (including critical care time)  Medications Ordered in ED Medications - No data to display  ED Course  I have reviewed the triage vital signs and the nursing notes.  Pertinent labs & imaging results that were available during my care of the patient were reviewed by me and considered in my medical decision making (see chart for details).    MDM Rules/Calculators/A&P                         Will observe for hemostasis. Check labs.   Stably hemostatic. Labs reassurring. Daughter at bedside, will hold CPAP for tonight, afrin bid x 2 days and PRN for bleeding.   Final Clinical Impression(s) / ED Diagnoses Final diagnoses:  Left-sided epistaxis    Rx / DC Orders ED Discharge Orders    None       Tabbitha Janvrin, Corene Cornea, MD 11/22/19 [phone]

## 2019-11-21 NOTE — ED Triage Notes (Signed)
86 yr old male arrives to Ed by ems, with a chief complaint of epistaxis, started 4 hrs ago.  Pt is on a blood thinner. Ems gave Afrin nasal spray x 2 bilaterally in each nare. Pt comes from home alert x 3, with a baseline hx of dementia. Ems held pressure for 30 mins without resolve. V/s on arrival 170/110, hr 65, spo2 94 percent room air, rr20, 98.3 temp.

## 2019-11-22 LAB — CBC WITH DIFFERENTIAL/PLATELET
Abs Immature Granulocytes: 0.2 10*3/uL — ABNORMAL HIGH (ref 0.00–0.07)
Basophils Absolute: 0.1 10*3/uL (ref 0.0–0.1)
Basophils Relative: 1 %
Eosinophils Absolute: 0.3 10*3/uL (ref 0.0–0.5)
Eosinophils Relative: 3 %
HCT: 38.8 % — ABNORMAL LOW (ref 39.0–52.0)
Hemoglobin: 13.1 g/dL (ref 13.0–17.0)
Immature Granulocytes: 2 %
Lymphocytes Relative: 13 %
Lymphs Abs: 1.3 10*3/uL (ref 0.7–4.0)
MCH: 34.3 pg — ABNORMAL HIGH (ref 26.0–34.0)
MCHC: 33.8 g/dL (ref 30.0–36.0)
MCV: 101.6 fL — ABNORMAL HIGH (ref 80.0–100.0)
Monocytes Absolute: 0.9 10*3/uL (ref 0.1–1.0)
Monocytes Relative: 9 %
Neutro Abs: 7.3 10*3/uL (ref 1.7–7.7)
Neutrophils Relative %: 72 %
Platelets: 208 10*3/uL (ref 150–400)
RBC: 3.82 MIL/uL — ABNORMAL LOW (ref 4.22–5.81)
RDW: 15.8 % — ABNORMAL HIGH (ref 11.5–15.5)
WBC: 10 10*3/uL (ref 4.0–10.5)
nRBC: 0.2 % (ref 0.0–0.2)

## 2019-11-22 LAB — PROTIME-INR
INR: 1 (ref 0.8–1.2)
Prothrombin Time: 12.9 seconds (ref 11.4–15.2)

## 2019-11-22 LAB — BASIC METABOLIC PANEL
Anion gap: 11 (ref 5–15)
BUN: 28 mg/dL — ABNORMAL HIGH (ref 8–23)
CO2: 25 mmol/L (ref 22–32)
Calcium: 9.6 mg/dL (ref 8.9–10.3)
Chloride: 101 mmol/L (ref 98–111)
Creatinine, Ser: 1.07 mg/dL (ref 0.61–1.24)
GFR calc non Af Amer: >60 mL/min (ref >60)
Glucose, Bld: 116 mg/dL — ABNORMAL HIGH (ref 70–99)
Potassium: 4 mmol/L (ref 3.5–5.1)
Sodium: 137 mmol/L (ref 135–145)

## 2019-11-22 NOTE — Discharge Instructions (Addendum)
Please use Afrin twice a day for the next 2 days.  Please do not use your CPAP tonight if you can.  Please do not put foreign bodies into your nose.  If you have recurrent nosebleed please follow instructions below: Whichever side of your nose is bleeding, squeeze the other side and blow out forcefully to remove all the blood and clot that is bleeding. Right after blowing all the blood and clot spray 3-4 squirts of Afrin in the side that was bleeding until you feel it draining down the back of your throat.  Immediately hold pressure on the soft part of your nose for no less than 10 minutes.  If you accidentally let up start over again. If this does not work repeat steps 1 through 3 one time and if it is still bleeding after that return to the emergency department. Do not hold your head backwards.  Sometimes ice can help.

## 2019-11-24 ENCOUNTER — Other Ambulatory Visit: Payer: Self-pay | Admitting: Physician Assistant

## 2019-12-09 ENCOUNTER — Other Ambulatory Visit: Payer: Self-pay

## 2019-12-09 ENCOUNTER — Emergency Department (HOSPITAL_COMMUNITY)
Admission: EM | Admit: 2019-12-09 | Discharge: 2019-12-09 | Disposition: A | Discharge status: Home / Self Care | Payer: Medicare Other | Attending: Emergency Medicine | Admitting: Emergency Medicine

## 2019-12-09 ENCOUNTER — Encounter (HOSPITAL_COMMUNITY): Payer: Self-pay

## 2019-12-09 DIAGNOSIS — Z96643 Presence of artificial hip joint, bilateral: Secondary | ICD-10-CM | POA: Diagnosis not present

## 2019-12-09 DIAGNOSIS — I2510 Atherosclerotic heart disease of native coronary artery without angina pectoris: Secondary | ICD-10-CM | POA: Insufficient documentation

## 2019-12-09 DIAGNOSIS — Z87891 Personal history of nicotine dependence: Secondary | ICD-10-CM | POA: Diagnosis not present

## 2019-12-09 DIAGNOSIS — I5043 Acute on chronic combined systolic (congestive) and diastolic (congestive) heart failure: Secondary | ICD-10-CM | POA: Insufficient documentation

## 2019-12-09 DIAGNOSIS — Z7901 Long term (current) use of anticoagulants: Secondary | ICD-10-CM | POA: Diagnosis not present

## 2019-12-09 DIAGNOSIS — Z79899 Other long term (current) drug therapy: Secondary | ICD-10-CM | POA: Insufficient documentation

## 2019-12-09 DIAGNOSIS — R350 Frequency of micturition: Secondary | ICD-10-CM

## 2019-12-09 DIAGNOSIS — N390 Urinary tract infection, site not specified: Secondary | ICD-10-CM

## 2019-12-09 DIAGNOSIS — Z7982 Long term (current) use of aspirin: Secondary | ICD-10-CM | POA: Diagnosis not present

## 2019-12-09 DIAGNOSIS — I110 Hypertensive heart disease with heart failure: Secondary | ICD-10-CM | POA: Insufficient documentation

## 2019-12-09 DIAGNOSIS — Z96652 Presence of left artificial knee joint: Secondary | ICD-10-CM | POA: Insufficient documentation

## 2019-12-09 LAB — URINALYSIS, ROUTINE W REFLEX MICROSCOPIC
Bilirubin Urine: NEGATIVE
Glucose, UA: NEGATIVE mg/dL
Ketones, ur: NEGATIVE mg/dL
Nitrite: POSITIVE — AB
Protein, ur: 30 mg/dL — AB
RBC / HPF: >50 RBC/hpf — ABNORMAL HIGH (ref 0–5)
Specific Gravity, Urine: 1.015 (ref 1.005–1.030)
WBC, UA: >50 WBC/hpf — ABNORMAL HIGH (ref 0–5)
pH: 5 (ref 5.0–8.0)

## 2019-12-09 LAB — CBG MONITORING, ED: Glucose-Capillary: 115 mg/dL — ABNORMAL HIGH (ref 70–99)

## 2019-12-09 MED ORDER — LIDOCAINE HCL 1 % IJ SOLN
INTRAMUSCULAR | Status: AC
  Administered 2019-12-09: 2.1 mL
  Filled 2019-12-09: qty 20

## 2019-12-09 MED ORDER — CEFTRIAXONE SODIUM 1 G IJ SOLR
1.0000 g | Freq: Once | INTRAMUSCULAR | Status: AC
  Administered 2019-12-09: 1 g via INTRAMUSCULAR
  Filled 2019-12-09: qty 10

## 2019-12-09 MED ORDER — CEPHALEXIN 500 MG PO CAPS: 500.0000 mg | ORAL_CAPSULE | Freq: Three times a day (TID) | ORAL | 0 refills | Status: AC

## 2019-12-09 NOTE — ED Notes (Signed)
FSBS 115

## 2019-12-09 NOTE — Discharge Instructions (Addendum)
Start taking antibiotics in the morning and finish as prescribed. Follow up with primary care provider to ensure you are improving and urine infection clears up. Return to ER if any lethargy, fever, abdominal pain, severe confusion, or vomiting.

## 2019-12-09 NOTE — ED Notes (Signed)
Pt sent from home via EMS for freq. Urination and urine with foul l odor

## 2019-12-09 NOTE — ED Notes (Signed)
Pt in bed watching tv with at bedside

## 2019-12-09 NOTE — ED Provider Notes (Signed)
[facility] DEPT Provider Note   CSN: [id number] Arrival date & time: 12/09/19  1950     History Chief Complaint  Patient presents with  . Urinary Frequency    pt from home via ems for freq. urination with odor     Larry Church is a 86 y.o. male.  86 year old male with past medical history below including OSA, CAD, CHF, memory problems who presents with urinary frequency.  Family reports that since midnight last night, patient has had increased urinary frequency, urinating much more frequently than he usually does.  Patient denies any hematuria or significant pain.  He denies any abdominal pain, vomiting, or known fevers.  Family reported that urine appeared dark with foul odor.  Family noted some mild intermittent confusion but nothing severe.  Patient himself states that he otherwise feels okay, denies polyuria.  He denies any shortness of breath at rest, does have SOB w/ exertion but states this is chronic for years.  The history is provided by the patient and a relative.  Urinary Frequency       Past Medical History:  Diagnosis Date  . Anxiety   . Arthritis    "bad; all over" (09/24/2014)  . Asthma   . Basal cell carcinoma    "cut and burned off  top of head and face"  . Chronic bronchitis ([place])    "got it q yr til this year" (09/24/2014)  . Chronic lower back pain   . Chronic sinusitis   . Coronary artery disease    LHC 10/15: pLAD 20-30, dLAD 50, origin D1 40; oLCx 40 then irregs; oRI 20, RCA diff irregs up to 10-20  . Degenerative arthritis   . Depression   . Echocardiograms    Echo 10/19: EF 55-60, no RWMA, mild AS (mean 14, peak 26), mild to mod AI, ascending aorta 40 mm, MAC, mild LAE, mild reduced RVSF, mild TR, PASP 43  . GERD (gastroesophageal reflux disease)   . Hypercholesterolemia   . Leg pain    ABIs 11/17: normal  . LV dysfunction    Echo 10/15: Mod LVH, EF 40-50, no apical clot, aortic sclerosis, MAC, trivial MR, mild  LAE  . Nuclear stress test    Nuclear stress test 10/19: EF 46, no ischemia; intermediate risk  . OSA on CPAP   . PVC's (premature ventricular contractions)    "I inherited a 4th skip from my mother"  . Seasonal allergies     Patient Active Problem List   Diagnosis Date Noted  . Moderate aortic stenosis by prior echocardiography 09/19/2018  . Dilated cardiomyopathy ([place]) 09/19/2018  . DOE (dyspnea on exertion) 09/19/2018  . Acute on chronic combined systolic and diastolic CHF (congestive heart failure) ([place]) 09/19/2018  . Longstanding persistent atrial fibrillation ([place]) 01/14/2018  . Abdominal pain, right lower quadrant 01/14/2018  . Mild cognitive impairment 01/14/2018  . BPH (benign prostatic hyperplasia) 01/14/2018  . Macrocytosis 01/14/2018  . Mild intermittent asthma 01/16/2015  . Obstructive sleep apnea treated with bilevel positive airway pressure (BiPAP) 01/16/2015  . Gastroesophageal reflux disease without esophagitis 01/16/2015  . Current use of beta blocker 01/16/2015  . Coughing 01/06/2015  . Primary osteoarthritis of left knee 09/24/2014  . Primary osteoarthritis of knee 09/24/2014  . Traumatic subarachnoid hemorrhage ([place]) 02/20/2014  . CAD (coronary artery disease) 12/25/2013  . Essential hypertension 12/25/2013  . Morbid obesity ([place]) 12/25/2013  . Dyslipidemia 12/11/2013  . Fatigue 10/31/2013  . Screening for lipoid disorders 10/31/2013  .  Obstructive apnea 10/31/2013  . Cervical pain 09/26/2013  . Beat, premature ventricular 09/13/2013  . Low back pain 09/10/2013  . 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 DDD (degenerative disc disease), lumbosacral 09/10/2013  . Acid reflux 09/10/2013  . History of colon polyps 09/10/2013  . Lumbar canal stenosis 09/10/2013  . Depression, major, recurrent ([place]) 09/10/2013  . Arthritis, degenerative 09/10/2013  . Atrophic kidney 09/10/2013  . Compressed spine fracture ([place]) 09/10/2013    Past Surgical History:  Procedure  Laterality Date  . ANTERIOR CERVICAL DECOMP/DISCECTOMY FUSION  1998  . ANTERIOR CERVICAL DISCECTOMY  1989  . APPENDECTOMY  1967  . BACK SURGERY    . BASAL CELL CARCINOMA EXCISION     "top of my head"  . CARPAL TUNNEL RELEASE Bilateral 1980's  . CATARACT EXTRACTION W/ INTRAOCULAR LENS  IMPLANT, BILATERAL Bilateral 2013  . CHOLECYSTECTOMY    . COLONOSCOPY    . CORONARY ANGIOPLASTY WITH STENT PLACEMENT  ~ 1992  . EYE MUSCLE SURGERY Right 2013  . IR KYPHO LUMBAR INC FX REDUCE BONE BX UNI/BIL CANNULATION INC/IMAGING  01/23/2018  . JOINT REPLACEMENT    . LEFT HEART CATHETERIZATION WITH CORONARY ANGIOGRAM N/A 12/10/2013   Procedure: LEFT HEART CATHETERIZATION WITH CORONARY ANGIOGRAM;  Surgeon: Leonie Man, MD;  Location: [facility] CATH LAB;  Service: Cardiovascular;  Laterality: N/A;  . LUMBAR LAMINECTOMY/DECOMPRESSION MICRODISCECTOMY Bilateral 12/04/2012   Procedure: BILATERAL LUMBAR LAMINECTOMY/DECOMPRESSION MICRODISCECTOMY LUMBAR FOUR-TO FIVE SACRALONE;  Surgeon: Elaina Hoops, MD;  Location: [facility]URO ORS;  Service: Neurosurgery;  Laterality: Bilateral;  . REVISION TOTAL HIP ARTHROPLASTY Right 2002   "changed a ball"  . RIGHT/LEFT HEART CATH AND CORONARY ANGIOGRAPHY N/A 09/19/2018   Procedure: RIGHT/LEFT HEART CATH AND CORONARY ANGIOGRAPHY;  Surgeon: Leonie Man, MD;  Location: [facility] CV LAB;  Service: Cardiovascular;  Laterality: N/A;  . SHOULDER ARTHROSCOPY Bilateral    bone spurs  . TONSILLECTOMY  ~ 1947  . TOTAL HIP ARTHROPLASTY Bilateral 1989-1995   "right-left"  . TOTAL KNEE ARTHROPLASTY Left 09/24/2014  . TOTAL KNEE ARTHROPLASTY Left 09/24/2014   Procedure: TOTAL KNEE ARTHROPLASTY;  Surgeon: Melrose Nakayama, MD;  Location: [facility];  Service: Orthopedics;  Laterality: Left;       Family History  Problem Relation Age of Onset  . Cancer Mother   . Cancer Brother   . Heart attack Neg Hx   . Stroke Neg Hx   . Hypertension Neg Hx     Social History   Tobacco Use  . Smoking  status: Former Smoker    Packs/day: 1.00    Years: 4.00    Pack years: 4.00    Types: Cigarettes    Quit date: 02/16/1960    Years since quitting: 59.8  . Smokeless tobacco: Never Used  Vaping Use  . Vaping Use: Never used  Substance Use Topics  . Alcohol use: No  . Drug use: No    Home Medications Prior to Admission medications   Medication Sig Start Date End Date Taking? Authorizing Provider  acetaminophen (TYLENOL) 500 MG tablet Take 500-1,000 mg by mouth every 6 (six) hours as needed for mild pain.    [provider]  acetaminophen (TYLENOL) 500 MG tablet Take 500-1,000 mg by mouth every 6 (six) hours as needed for moderate pain or headache.     [provider]  albuterol (VENTOLIN HFA) 108 (90 Base) MCG/ACT inhaler Inhale 2 puffs into the lungs every 6 (six) hours as needed.  05/14/19   [provider]  albuterol (VENTOLIN HFA) 108 (90 Base) MCG/ACT inhaler Inhale 2 puffs into the lungs every 6 (six) hours as needed for wheezing or shortness of breath.    [provider]  alfuzosin (UROXATRAL) 10 MG 24 hr tablet Take 10 mg by mouth at bedtime.  06/30/17   [provider]  alfuzosin (UROXATRAL) 10 MG 24 hr tablet Take 10 mg by mouth daily with breakfast.    [provider]  apixaban (ELIQUIS) 5 MG TABS tablet Take 1 tablet (5 mg total) by mouth 2 (two) times daily. 09/17/19   Fay Records, MD  apixaban (ELIQUIS) 5 MG TABS tablet Take 5 mg by mouth 2 (two) times daily.    [provider]  aspirin EC 81 MG tablet Take 1 tablet (81 mg total) by mouth daily. Patient taking differently: Take 81 mg by mouth at bedtime.  03/17/17   Fay Records, MD  aspirin EC 81 MG tablet Take 81 mg by mouth daily. Swallow whole.    [provider]  atorvastatin (LIPITOR) 40 MG tablet TAKE 1 TABLET(40 MG) BY MOUTH DAILY 09/26/19   Fay Records, MD  atorvastatin (LIPITOR) 40 MG tablet Take 40 mg by mouth daily.    [provider]    azelastine (ASTELIN) 0.1 % nasal spray Place 1 spray into both nostrils daily. Use in each nostril as directed    [provider]  azelastine (ASTELIN) 0.1 % nasal spray Place 1 spray into both nostrils daily. Use in each nostril as directed    [provider]  cephALEXin (KEFLEX) 500 MG capsule Take 1 capsule (500 mg total) by mouth 3 (three) times daily for 7 days. 12/09/19 12/16/19  Tiauna Whisnant, Wenda Overland, MD  Dextromethorphan-Guaifenesin (MUCINEX DM MAXIMUM STRENGTH) 60-1200 MG TB12 Take 1 tablet by mouth daily.     [provider]  EPINEPHrine (EPIPEN 2-PAK) 0.3 mg/0.3 mL IJ SOAJ injection Inject 0.3 mLs (0.3 mg total) into the muscle once. Patient taking differently: Inject 0.3 mg into the muscle as needed for anaphylaxis.  11/25/14   Charlies Silvers, MD  EPINEPHrine 0.3 mg/0.3 mL IJ SOAJ injection Inject 0.3 mg into the muscle as needed for anaphylaxis.    [provider]  finasteride (PROSCAR) 5 MG tablet Take 5 mg by mouth at bedtime.     [provider]  finasteride (PROSCAR) 5 MG tablet Take 5 mg by mouth daily.    [provider]  fluticasone Asencion Islam) 50 MCG/ACT nasal spray 2 sprays per nostril once a day if needed for stuffy nose use daily until end of june 07/02/19   Charlies Silvers, MD  fluticasone (FLONASE) 50 MCG/ACT nasal spray Place 2 sprays into both nostrils daily as needed for allergies or rhinitis.    [provider]  furosemide (LASIX) 20 MG tablet Take 1 tablet (20 mg total) by mouth as directed. No more than 3 times a week as needed 09/14/19   Fay Records, MD  furosemide (LASIX) 20 MG tablet Take 20 mg by mouth See admin instructions. 3 times weekly as needed    [provider]  gabapentin (NEURONTIN) 100 MG capsule Take 100 mg by mouth 2 (two) times a day.  04/28/18   [provider]  gabapentin (NEURONTIN) 100 MG capsule Take 100 mg by mouth 2 (two) times daily.    [provider]   guaiFENesin (MUCINEX) 600 MG 12 hr tablet Take by mouth 2 (two) times daily.  [provider]  guaiFENesin (MUCINEX) 600 MG 12 hr tablet Take 600 mg by mouth 2 (two) times daily.    [provider]  ipratropium (ATROVENT) 0.06 % nasal spray Place 2 sprays into both nostrils 2 (two) times a day.  08/15/18   [provider]  ipratropium (ATROVENT) 0.06 % nasal spray Place 2 sprays into both nostrils every 12 (twelve) hours.    [provider]  isosorbide mononitrate (IMDUR) 30 MG 24 hr tablet Take 1 tablet (30 mg total) by mouth daily. 06/26/19   Richardson Dopp T, PA-C  isosorbide mononitrate (IMDUR) 30 MG 24 hr tablet Take 30 mg by mouth daily.    [provider]  levocetirizine (XYZAL) 5 MG tablet Take 5 mg by mouth every evening.    [provider]  levocetirizine (XYZAL) 5 MG tablet Take 5 mg by mouth every evening.    [provider]  memantine (NAMENDA) 10 MG tablet Take 10 mg by mouth 2 (two) times daily.  08/17/17   [provider]  memantine (NAMENDA) 10 MG tablet Take 10 mg by mouth 2 (two) times daily.    [provider]  metoprolol tartrate (LOPRESSOR) 25 MG tablet Take 0.5 tablets (12.5 mg total) by mouth 2 (two) times daily. 11/27/19   Fay Records, MD  nitroGLYCERIN (NITROSTAT) 0.4 MG SL tablet PLACE 1 TABLET UNDER TONGUE EVERY 5 MINUTES FOR 3 DOSES FOR CHEST PAIN 01/15/19   Richardson Dopp T, PA-C  nitroGLYCERIN (NITROSTAT) 0.4 MG SL tablet Place 0.4 mg under the tongue every 5 (five) minutes as needed for chest pain.    [provider]  NON FORMULARY Place 3 drops under the tongue at bedtime. Allergy Drop sublingual immunotherapy (SLIT)    [provider]  omeprazole (PRILOSEC) 40 MG capsule Take 40 mg by mouth 2 (two) times daily.  09/02/17   [provider]  omeprazole (PRILOSEC) 40 MG capsule Take 40 mg by mouth in the morning and at bedtime.    [provider]   Polyethyl Glycol-Propyl Glycol (SYSTANE) 0.4-0.3 % SOLN Place 1 drop into both eyes 3 (three) times daily as needed (dry/irritated eyes.).    [provider]  Polyethyl Glycol-Propyl Glycol (SYSTANE) 0.4-0.3 % SOLN Place 1 drop into both eyes 3 (three) times daily as needed (dry eyes).    [provider]  traMADol (ULTRAM) 50 MG tablet Take 1 tablet (50 mg total) by mouth every 4 (four) hours as needed for moderate pain. 01/19/18   Charlynne Cousins, MD  traMADol (ULTRAM) 50 MG tablet Take 50 mg by mouth every 4 (four) hours as needed for severe pain.    [provider]  triamcinolone cream (KENALOG) 0.1 % Apply 1 application topically 2 (two) times daily.    [provider]  triamcinolone cream (KENALOG) 0.1 % Apply 1 application topically 2 (two) times daily as needed (irritation).    [provider]  venlafaxine XR (EFFEXOR-XR) 75 MG 24 hr capsule Take 75-150 capsules by mouth See admin instructions. Take 2 capsules (150 mg) by mouth in the morning & take 1 capsule (75 mg) by mouth at night. 07/14/16   [provider]  venlafaxine XR (EFFEXOR-XR) 75 MG 24 hr capsule Take 75-150 mg by mouth See admin instructions. 150 mg in the morning, 75 mg at bedtime    [provider]    Allergies    Penicillins, Penicillins, Bee venom, Donepezil, and Methocarbamol  Review of Systems  Review of Systems  Unable to perform ROS: Dementia  Genitourinary: Positive for frequency.    Physical Exam Updated Vital Signs BP 118/75   Pulse (!) 108   Temp 98.1 F (36.7 C)   Resp (!) 32   Ht _0  (1.676 m)   Wt 99.8 kg   SpO2 93%   BMI 35.51 kg/m   Physical Exam Vitals and nursing note reviewed.  Constitutional:      General: He is not in acute distress.    Appearance: He is well-developed. He is obese.  HENT:     Head: Normocephalic and atraumatic.  Eyes:     Conjunctiva/sclera: Conjunctivae normal.  Cardiovascular:     Rate and  Rhythm: Normal rate and regular rhythm.     Heart sounds: Normal heart sounds. No murmur heard.   Pulmonary:     Breath sounds: Normal breath sounds.     Comments: Mildly tachypneic Abdominal:     General: Bowel sounds are normal. There is no distension.     Palpations: Abdomen is soft.     Tenderness: There is no abdominal tenderness.  Musculoskeletal:     Cervical back: Neck supple.     Right lower leg: Edema present.     Left lower leg: Edema present.  Skin:    General: Skin is warm and dry.  Neurological:     Mental Status: He is alert and oriented to person, place, and time.     Comments: Fluent speech  Psychiatric:        Mood and Affect: Mood normal.     ED Results / Procedures / Treatments   Labs (all labs ordered are listed, but only abnormal results are displayed) Labs Reviewed  URINALYSIS, ROUTINE W REFLEX MICROSCOPIC - Abnormal; Notable for the following components:      Result Value   APPearance CLOUDY (*)    Hgb urine dipstick LARGE (*)    Protein, ur 30 (*)    Nitrite POSITIVE (*)    Leukocytes,Ua LARGE (*)    RBC / HPF >50 (*)    WBC, UA >50 (*)    Bacteria, UA FEW (*)    All other components within normal limits  CBG MONITORING, ED - Abnormal; Notable for the following components:   Glucose-Capillary 115 (*)    All other components within normal limits  URINE CULTURE    EKG None  Radiology No results found.  Procedures Procedures (including critical care time)  Medications Ordered in ED Medications  cefTRIAXone (ROCEPHIN) injection 1 g (1 g Intramuscular Given 12/09/19 2207)  lidocaine (XYLOCAINE) 1 % (with pres) injection (2.1 mLs  Given 12/09/19 2207)    ED Course  I have reviewed the triage vital signs and the nursing notes.  Pertinent labs & imaging results that were available during my care of the patient were reviewed by me and considered in my medical decision making (see chart for details).    MDM Rules/Calculators/A&P                           Comfortable on exam, mildly tachypneic after moving around but denies respiratory complaints. BG reassuring. UA c/w infection.  He has allergy listed to penicillins, I was able to confirm with pharmacy that he has received Omnicef in the past.  Therefore gave ceftriaxone dose here and provided with Keflex to continue at home.  I have discussed with family member the need for close monitoring for  any worsening symptoms and close follow-up with PCP to ensure resolution of infection.  They voiced understanding of return precautions. Final Clinical Impression(s) / ED Diagnoses Final diagnoses:  Urinary tract infection without hematuria, site unspecified  Urinary frequency    Rx / DC Orders ED Discharge Orders         Ordered    cephALEXin (KEFLEX) 500 MG capsule  3 times daily        12/09/19 2231           Yadier Bramhall, Wenda Overland, MD 12/09/19 2315

## 2019-12-12 LAB — URINE CULTURE: Culture: >=100000 — AB

## 2019-12-13 ENCOUNTER — Emergency Department (HOSPITAL_COMMUNITY): Payer: Medicare Other

## 2019-12-13 ENCOUNTER — Other Ambulatory Visit: Payer: Self-pay

## 2019-12-13 ENCOUNTER — Encounter (HOSPITAL_COMMUNITY): Payer: Self-pay

## 2019-12-13 ENCOUNTER — Inpatient Hospital Stay (HOSPITAL_COMMUNITY)
Admission: EM | Admit: 2019-12-13 | Discharge: 2019-12-16 | DRG: 291 | Disposition: A | Discharge status: Home Health | Payer: Medicare Other | Attending: Internal Medicine | Admitting: Internal Medicine

## 2019-12-13 ENCOUNTER — Telehealth: Payer: Self-pay

## 2019-12-13 DIAGNOSIS — K219 Gastro-esophageal reflux disease without esophagitis: Secondary | ICD-10-CM | POA: Diagnosis present

## 2019-12-13 DIAGNOSIS — L03115 Cellulitis of right lower limb: Secondary | ICD-10-CM | POA: Diagnosis present

## 2019-12-13 DIAGNOSIS — T502X5A Adverse effect of carbonic-anhydrase inhibitors, benzothiadiazides and other diuretics, initial encounter: Secondary | ICD-10-CM | POA: Diagnosis present

## 2019-12-13 DIAGNOSIS — Z96643 Presence of artificial hip joint, bilateral: Secondary | ICD-10-CM | POA: Diagnosis present

## 2019-12-13 DIAGNOSIS — I050 Rheumatic mitral stenosis: Secondary | ICD-10-CM | POA: Diagnosis not present

## 2019-12-13 DIAGNOSIS — Z20822 Contact with and (suspected) exposure to covid-19: Secondary | ICD-10-CM | POA: Diagnosis present

## 2019-12-13 DIAGNOSIS — N400 Enlarged prostate without lower urinary tract symptoms: Secondary | ICD-10-CM | POA: Diagnosis present

## 2019-12-13 DIAGNOSIS — I2510 Atherosclerotic heart disease of native coronary artery without angina pectoris: Secondary | ICD-10-CM | POA: Diagnosis present

## 2019-12-13 DIAGNOSIS — Z85828 Personal history of other malignant neoplasm of skin: Secondary | ICD-10-CM | POA: Diagnosis not present

## 2019-12-13 DIAGNOSIS — I4819 Other persistent atrial fibrillation: Secondary | ICD-10-CM | POA: Diagnosis present

## 2019-12-13 DIAGNOSIS — I350 Nonrheumatic aortic (valve) stenosis: Secondary | ICD-10-CM | POA: Diagnosis not present

## 2019-12-13 DIAGNOSIS — Z955 Presence of coronary angioplasty implant and graft: Secondary | ICD-10-CM

## 2019-12-13 DIAGNOSIS — Z87891 Personal history of nicotine dependence: Secondary | ICD-10-CM | POA: Diagnosis not present

## 2019-12-13 DIAGNOSIS — Z79899 Other long term (current) drug therapy: Secondary | ICD-10-CM | POA: Diagnosis not present

## 2019-12-13 DIAGNOSIS — I509 Heart failure, unspecified: Secondary | ICD-10-CM | POA: Diagnosis present

## 2019-12-13 DIAGNOSIS — Z7982 Long term (current) use of aspirin: Secondary | ICD-10-CM

## 2019-12-13 DIAGNOSIS — M7989 Other specified soft tissue disorders: Secondary | ICD-10-CM | POA: Diagnosis not present

## 2019-12-13 DIAGNOSIS — Z96652 Presence of left artificial knee joint: Secondary | ICD-10-CM | POA: Diagnosis present

## 2019-12-13 DIAGNOSIS — Z7901 Long term (current) use of anticoagulants: Secondary | ICD-10-CM

## 2019-12-13 DIAGNOSIS — I5043 Acute on chronic combined systolic (congestive) and diastolic (congestive) heart failure: Secondary | ICD-10-CM | POA: Diagnosis present

## 2019-12-13 DIAGNOSIS — I255 Ischemic cardiomyopathy: Secondary | ICD-10-CM | POA: Diagnosis present

## 2019-12-13 DIAGNOSIS — E785 Hyperlipidemia, unspecified: Secondary | ICD-10-CM | POA: Diagnosis present

## 2019-12-13 DIAGNOSIS — F0390 Unspecified dementia without behavioral disturbance: Secondary | ICD-10-CM | POA: Diagnosis present

## 2019-12-13 DIAGNOSIS — R0600 Dyspnea, unspecified: Secondary | ICD-10-CM | POA: Diagnosis not present

## 2019-12-13 DIAGNOSIS — N179 Acute kidney failure, unspecified: Secondary | ICD-10-CM | POA: Diagnosis present

## 2019-12-13 DIAGNOSIS — N401 Enlarged prostate with lower urinary tract symptoms: Secondary | ICD-10-CM | POA: Diagnosis not present

## 2019-12-13 DIAGNOSIS — Z981 Arthrodesis status: Secondary | ICD-10-CM

## 2019-12-13 DIAGNOSIS — E7800 Pure hypercholesterolemia, unspecified: Secondary | ICD-10-CM | POA: Diagnosis present

## 2019-12-13 DIAGNOSIS — J45909 Unspecified asthma, uncomplicated: Secondary | ICD-10-CM | POA: Diagnosis present

## 2019-12-13 DIAGNOSIS — Z7189 Other specified counseling: Secondary | ICD-10-CM | POA: Diagnosis not present

## 2019-12-13 DIAGNOSIS — Z66 Do not resuscitate: Secondary | ICD-10-CM | POA: Diagnosis not present

## 2019-12-13 DIAGNOSIS — Z6838 Body mass index (BMI) 38.0-38.9, adult: Secondary | ICD-10-CM

## 2019-12-13 DIAGNOSIS — B9620 Unspecified Escherichia coli [E. coli] as the cause of diseases classified elsewhere: Secondary | ICD-10-CM | POA: Diagnosis present

## 2019-12-13 DIAGNOSIS — I110 Hypertensive heart disease with heart failure: Secondary | ICD-10-CM | POA: Diagnosis not present

## 2019-12-13 DIAGNOSIS — G4733 Obstructive sleep apnea (adult) (pediatric): Secondary | ICD-10-CM | POA: Diagnosis present

## 2019-12-13 DIAGNOSIS — J84112 Idiopathic pulmonary fibrosis: Secondary | ICD-10-CM | POA: Diagnosis present

## 2019-12-13 DIAGNOSIS — R531 Weakness: Secondary | ICD-10-CM | POA: Diagnosis not present

## 2019-12-13 DIAGNOSIS — I10 Essential (primary) hypertension: Secondary | ICD-10-CM | POA: Diagnosis not present

## 2019-12-13 DIAGNOSIS — R0609 Other forms of dyspnea: Secondary | ICD-10-CM | POA: Diagnosis present

## 2019-12-13 DIAGNOSIS — N390 Urinary tract infection, site not specified: Secondary | ICD-10-CM | POA: Diagnosis present

## 2019-12-13 DIAGNOSIS — Z515 Encounter for palliative care: Secondary | ICD-10-CM | POA: Diagnosis not present

## 2019-12-13 LAB — CBC
HCT: 40.5 % (ref 39.0–52.0)
Hemoglobin: 12.9 g/dL — ABNORMAL LOW (ref 13.0–17.0)
MCH: 33.9 pg (ref 26.0–34.0)
MCHC: 31.9 g/dL (ref 30.0–36.0)
MCV: 106.3 fL — ABNORMAL HIGH (ref 80.0–100.0)
Platelets: 278 10*3/uL (ref 150–400)
RBC: 3.81 MIL/uL — ABNORMAL LOW (ref 4.22–5.81)
RDW: 16.9 % — ABNORMAL HIGH (ref 11.5–15.5)
WBC: 10.9 10*3/uL — ABNORMAL HIGH (ref 4.0–10.5)
nRBC: 0.2 % (ref 0.0–0.2)

## 2019-12-13 LAB — RESPIRATORY PANEL BY RT PCR (FLU A&B, COVID)
Influenza A by PCR: NEGATIVE
Influenza B by PCR: NEGATIVE
SARS Coronavirus 2 by RT PCR: NEGATIVE

## 2019-12-13 LAB — MAGNESIUM: Magnesium: 2.4 mg/dL (ref 1.7–2.4)

## 2019-12-13 LAB — COMPREHENSIVE METABOLIC PANEL
ALT: 29 U/L (ref 0–44)
AST: 29 U/L (ref 15–41)
Albumin: 3.1 g/dL — ABNORMAL LOW (ref 3.5–5.0)
Alkaline Phosphatase: 78 U/L (ref 38–126)
Anion gap: 8 (ref 5–15)
BUN: 20 mg/dL (ref 8–23)
CO2: 27 mmol/L (ref 22–32)
Calcium: 9.2 mg/dL (ref 8.9–10.3)
Chloride: 101 mmol/L (ref 98–111)
Creatinine, Ser: 1 mg/dL (ref 0.61–1.24)
GFR, Estimated: >60 mL/min (ref >60)
Glucose, Bld: 90 mg/dL (ref 70–99)
Potassium: 4.1 mmol/L (ref 3.5–5.1)
Sodium: 136 mmol/L (ref 135–145)
Total Bilirubin: 0.8 mg/dL (ref 0.3–1.2)
Total Protein: 7.2 g/dL (ref 6.5–8.1)

## 2019-12-13 LAB — PROTIME-INR
INR: 1.3 — ABNORMAL HIGH (ref 0.8–1.2)
Prothrombin Time: 15.6 seconds — ABNORMAL HIGH (ref 11.4–15.2)

## 2019-12-13 LAB — TROPONIN I (HIGH SENSITIVITY)
Troponin I (High Sensitivity): 20 ng/L — ABNORMAL HIGH (ref <18)
Troponin I (High Sensitivity): 20 ng/L — ABNORMAL HIGH (ref <18)

## 2019-12-13 LAB — BRAIN NATRIURETIC PEPTIDE: B Natriuretic Peptide: 250.1 pg/mL — ABNORMAL HIGH (ref 0.0–100.0)

## 2019-12-13 MED ORDER — ACETAMINOPHEN 325 MG PO TABS: 650.0000 mg | ORAL_TABLET | ORAL | Status: DC | PRN

## 2019-12-13 MED ORDER — SODIUM CHLORIDE 0.9% FLUSH: 3.0000 mL | INTRAVENOUS | Status: DC | PRN

## 2019-12-13 MED ORDER — FUROSEMIDE 10 MG/ML IJ SOLN
40.0000 mg | Freq: Once | INTRAMUSCULAR | Status: AC
  Administered 2019-12-13: 40 mg via INTRAVENOUS
  Filled 2019-12-13: qty 4

## 2019-12-13 MED ORDER — ATORVASTATIN CALCIUM 40 MG PO TABS
40.0000 mg | ORAL_TABLET | Freq: Every day | ORAL | Status: DC
  Administered 2019-12-13 – 2019-12-16 (×4): 40 mg via ORAL
  Filled 2019-12-13 (×3): qty 1

## 2019-12-13 MED ORDER — ALFUZOSIN HCL ER 10 MG PO TB24
10.0000 mg | ORAL_TABLET | Freq: Every day | ORAL | Status: DC
  Administered 2019-12-14 – 2019-12-16 (×3): 10 mg via ORAL
  Filled 2019-12-13 (×3): qty 1

## 2019-12-13 MED ORDER — PANTOPRAZOLE SODIUM 40 MG PO TBEC
40.0000 mg | DELAYED_RELEASE_TABLET | Freq: Every day | ORAL | Status: DC
  Administered 2019-12-14 – 2019-12-16 (×3): 40 mg via ORAL
  Filled 2019-12-13 (×3): qty 1

## 2019-12-13 MED ORDER — VENLAFAXINE HCL ER 75 MG PO CP24
75.0000 mg | ORAL_CAPSULE | Freq: Every day | ORAL | Status: DC
  Administered 2019-12-13 – 2019-12-15 (×3): 75 mg via ORAL
  Filled 2019-12-13 (×3): qty 1

## 2019-12-13 MED ORDER — APIXABAN 5 MG PO TABS
5.0000 mg | ORAL_TABLET | Freq: Two times a day (BID) | ORAL | Status: DC
  Administered 2019-12-13 – 2019-12-16 (×6): 5 mg via ORAL
  Filled 2019-12-13 (×6): qty 1

## 2019-12-13 MED ORDER — SODIUM CHLORIDE 0.9 % IV SOLN: 250.0000 mL | INTRAVENOUS | Status: DC | PRN

## 2019-12-13 MED ORDER — FINASTERIDE 5 MG PO TABS
5.0000 mg | ORAL_TABLET | Freq: Every day | ORAL | Status: DC
  Administered 2019-12-13 – 2019-12-16 (×4): 5 mg via ORAL
  Filled 2019-12-13 (×3): qty 1

## 2019-12-13 MED ORDER — ASPIRIN EC 81 MG PO TBEC
81.0000 mg | DELAYED_RELEASE_TABLET | Freq: Every day | ORAL | Status: DC
  Administered 2019-12-13 – 2019-12-16 (×4): 81 mg via ORAL
  Filled 2019-12-13 (×3): qty 1

## 2019-12-13 MED ORDER — SODIUM CHLORIDE 0.9% FLUSH
3.0000 mL | Freq: Two times a day (BID) | INTRAVENOUS | Status: DC
  Administered 2019-12-13 – 2019-12-16 (×6): 3 mL via INTRAVENOUS

## 2019-12-13 MED ORDER — METOPROLOL TARTRATE 25 MG PO TABS
12.5000 mg | ORAL_TABLET | Freq: Two times a day (BID) | ORAL | Status: DC
  Administered 2019-12-13 – 2019-12-14 (×2): 12.5 mg via ORAL
  Filled 2019-12-13 (×2): qty 1

## 2019-12-13 MED ORDER — TRAMADOL HCL 50 MG PO TABS
50.0000 mg | ORAL_TABLET | ORAL | Status: DC | PRN
  Administered 2019-12-14 – 2019-12-15 (×2): 50 mg via ORAL
  Filled 2019-12-13 (×3): qty 1

## 2019-12-13 MED ORDER — GABAPENTIN 100 MG PO CAPS
100.0000 mg | ORAL_CAPSULE | Freq: Two times a day (BID) | ORAL | Status: DC
  Administered 2019-12-13 – 2019-12-16 (×6): 100 mg via ORAL
  Filled 2019-12-13 (×6): qty 1

## 2019-12-13 MED ORDER — NITROGLYCERIN 0.4 MG SL SUBL: 0.4000 mg | SUBLINGUAL_TABLET | SUBLINGUAL | Status: DC | PRN

## 2019-12-13 MED ORDER — FUROSEMIDE 10 MG/ML IJ SOLN
40.0000 mg | Freq: Two times a day (BID) | INTRAMUSCULAR | Status: DC
  Administered 2019-12-14 (×2): 40 mg via INTRAVENOUS
  Filled 2019-12-13 (×2): qty 4

## 2019-12-13 MED ORDER — ONDANSETRON HCL 4 MG/2ML IJ SOLN: 4.0000 mg | Freq: Four times a day (QID) | INTRAMUSCULAR | Status: DC | PRN

## 2019-12-13 MED ORDER — CEPHALEXIN 500 MG PO CAPS
500.0000 mg | ORAL_CAPSULE | Freq: Three times a day (TID) | ORAL | Status: DC
  Administered 2019-12-13 – 2019-12-16 (×7): 500 mg via ORAL
  Filled 2019-12-13 (×8): qty 1

## 2019-12-13 MED ORDER — MEMANTINE HCL 10 MG PO TABS
10.0000 mg | ORAL_TABLET | Freq: Two times a day (BID) | ORAL | Status: DC
  Administered 2019-12-13 – 2019-12-16 (×6): 10 mg via ORAL
  Filled 2019-12-13 (×6): qty 1

## 2019-12-13 MED ORDER — VENLAFAXINE HCL ER 150 MG PO CP24
150.0000 mg | ORAL_CAPSULE | Freq: Every day | ORAL | Status: DC
  Administered 2019-12-14 – 2019-12-16 (×3): 150 mg via ORAL
  Filled 2019-12-13 (×3): qty 1

## 2019-12-13 MED ORDER — ISOSORBIDE MONONITRATE ER 30 MG PO TB24
30.0000 mg | ORAL_TABLET | Freq: Every day | ORAL | Status: DC
  Administered 2019-12-14: 30 mg via ORAL
  Filled 2019-12-13: qty 1

## 2019-12-13 NOTE — ED Notes (Signed)
Bed has not been approved by charge RN
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
## 2019-12-13 NOTE — Telephone Encounter (Signed)
No treatment for [facility] ED 12/09/19 per Nicki Reaper christy Pharm D

## 2019-12-13 NOTE — ED Triage Notes (Signed)
Pt bib ems from PCP following concerns for bilateral ankle swelling that began 2 days ago and 10+ pounds of weight gain over 10 days.  Pt also experiencing increased SOB upon exertion over the past few days. Ambulates with a walker at home. A/Ox4. Afib in triage.    EMS vitals  114/80 HR 88  RR 24 95% RA 98.39f

## 2019-12-13 NOTE — ED Notes (Signed)
Report given to Marlowe Shores RN on the 4th floor.  SBAR covered during report.  NO additonal question at this time.

## 2019-12-13 NOTE — ED Provider Notes (Signed)
[facility] Emergency Department Provider Note MRN:  [id number]  Arrival date & time: 12/13/19     Chief Complaint   Joint Swelling and Shortness of Breath   History of Present Illness   ASKIA HAZELIP is a 86 y.o. year-old male with a history of CAD, CHF presenting to the ED with chief complaint of leg swelling.  Worsening leg swelling for the past 2 weeks.  More recently with shortness of breath with exertion, only able to take 2 or 3 steps before getting winded.  Shortness of breath also worse when laying flat.  Denies chest pain, chronic cough that is unchanged, no fever, no abdominal pain.  Endorses weight gain recently, also endorsing generalized body aches.  Symptoms are constant, moderate, no other exacerbating or relieving factors  Review of Systems  A complete 10 system review of systems was obtained and all systems are negative except as noted in the HPI and PMH.   Patient's Health History    Past Medical History:  Diagnosis Date  . Anxiety   . Arthritis    "bad; all over" (09/24/2014)  . Asthma   . Basal cell carcinoma    "cut and burned off  top of head and face"  . Chronic bronchitis ([place])    "got it q yr til this year" (09/24/2014)  . Chronic lower back pain   . Chronic sinusitis   . Coronary artery disease    LHC 10/15: pLAD 20-30, dLAD 50, origin D1 40; oLCx 40 then irregs; oRI 20, RCA diff irregs up to 10-20  . Degenerative arthritis   . Depression   . Echocardiograms    Echo 10/19: EF 55-60, no RWMA, mild AS (mean 14, peak 26), mild to mod AI, ascending aorta 40 mm, MAC, mild LAE, mild reduced RVSF, mild TR, PASP 43  . GERD (gastroesophageal reflux disease)   . Hypercholesterolemia   . Leg pain    ABIs 11/17: normal  . LV dysfunction    Echo 10/15: Mod LVH, EF 40-50, no apical clot, aortic sclerosis, MAC, trivial MR, mild LAE  . Nuclear stress test    Nuclear stress test 10/19: EF 46, no ischemia; intermediate risk  . OSA on CPAP     . PVC's (premature ventricular contractions)    "I inherited a 4th skip from my mother"  . Seasonal allergies     Past Surgical History:  Procedure Laterality Date  . ANTERIOR CERVICAL DECOMP/DISCECTOMY FUSION  1998  . ANTERIOR CERVICAL DISCECTOMY  1989  . APPENDECTOMY  1967  . BACK SURGERY    . BASAL CELL CARCINOMA EXCISION     "top of my head"  . CARPAL TUNNEL RELEASE Bilateral 1980's  . CATARACT EXTRACTION W/ INTRAOCULAR LENS  IMPLANT, BILATERAL Bilateral 2013  . CHOLECYSTECTOMY    . COLONOSCOPY    . CORONARY ANGIOPLASTY WITH STENT PLACEMENT  ~ 1992  . EYE MUSCLE SURGERY Right 2013  . IR KYPHO LUMBAR INC FX REDUCE BONE BX UNI/BIL CANNULATION INC/IMAGING  01/23/2018  . JOINT REPLACEMENT    . LEFT HEART CATHETERIZATION WITH CORONARY ANGIOGRAM N/A 12/10/2013   Procedure: LEFT HEART CATHETERIZATION WITH CORONARY ANGIOGRAM;  Surgeon: Leonie Man, MD;  Location: [facility] CATH LAB;  Service: Cardiovascular;  Laterality: N/A;  . LUMBAR LAMINECTOMY/DECOMPRESSION MICRODISCECTOMY Bilateral 12/04/2012   Procedure: BILATERAL LUMBAR LAMINECTOMY/DECOMPRESSION MICRODISCECTOMY LUMBAR FOUR-TO FIVE SACRALONE;  Surgeon: Elaina Hoops, MD;  Location: [facility]UR[facility] OR[facility];  Service: Neurosurgery;  Laterality: Bilateral;  . REVISION TOTAL  HIP ARTHROPLASTY Right 2002   "changed a ball"  . RIGHT/LEFT HEART CATH AND CORONARY ANGIOGRAPHY N/A 09/19/2018   Procedure: RIGHT/LEFT HEART CATH AND CORONARY ANGIOGRAPHY;  Surgeon: Leonie Man, MD;  Location: [facility] CV LAB;  Service: Cardiovascular;  Laterality: N/A;  . SHOULDER ARTHROSCOPY Bilateral    bone spurs  . TONSILLECTOMY  ~ 1947  . TOTAL HIP ARTHROPLASTY Bilateral 1989-1995   "right-left"  . TOTAL KNEE ARTHROPLASTY Left 09/24/2014  . TOTAL KNEE ARTHROPLASTY Left 09/24/2014   Procedure: TOTAL KNEE ARTHROPLASTY;  Surgeon: Melrose Nakayama, MD;  Location: [facility];  Service: Orthopedics;  Laterality: Left;    Family History  Problem Relation Age of Onset  . Cancer  Mother   . Cancer Brother   . Heart attack Neg Hx   . Stroke Neg Hx   . Hypertension Neg Hx     Social History   Socioeconomic History  . Marital status: Married    Spouse name: Dixon Boos  . Number of children: 3  . Years of education: Not on file  . Highest education level: Not on file  Occupational History  . Not on file  Tobacco Use  . Smoking status: Former Smoker    Packs/day: 1.00    Years: 4.00    Pack years: 4.00    Types: Cigarettes    Quit date: 02/16/1960    Years since quitting: 59.8  . Smokeless tobacco: Never Used  Vaping Use  . Vaping Use: Never used  Substance and Sexual Activity  . Alcohol use: No  . Drug use: No  . Sexual activity: Not Currently  Other Topics Concern  . Not on file  Social History Narrative  . Not on file   Social Determinants of Health   Financial Resource Strain:   . Difficulty of Paying Living Expenses: Not on file  Food Insecurity:   . Worried About Charity fundraiser in the Last Year: Not on file  . Ran Out of Food in the Last Year: Not on file  Transportation Needs:   . Lack of Transportation (Medical): Not on file  . Lack of Transportation (Non-Medical): Not on file  Physical Activity:   . Days of Exercise per Week: Not on file  . Minutes of Exercise per Session: Not on file  Stress:   . Feeling of Stress : Not on file  Social Connections:   . Frequency of Communication with Friends and Family: Not on file  . Frequency of Social Gatherings with Friends and Family: Not on file  . Attends Religious Services: Not on file  . Active Member of Clubs or Organizations: Not on file  . Attends Archivist Meetings: Not on file  . Marital Status: Not on file  Intimate Partner Violence:   . Fear of Current or Ex-Partner: Not on file  . Emotionally Abused: Not on file  . Physically Abused: Not on file  . Sexually Abused: Not on file     Physical Exam   Vitals:   12/13/19 1400 12/13/19 1509  BP:  112/80  Pulse:  89 (!) 106  Resp: 14 (!) 23  Temp:    SpO2: 94% 94%    CONSTITUTIONAL: Chronically ill-appearing, NAD NEURO:  Alert and oriented x 3, no focal deficits EYES:  eyes equal and reactive ENT/NECK:  no LAD, no JVD CARDIO: Regular rate, well-perfused, normal S1 and S2 PULM:  CTAB no wheezing or rhonchi GI/GU:  normal bowel sounds, non-distended, non-tender MSK/SPINE:  No gross deformities, 2+ pitting edema to the bilateral lower extremities SKIN:  no rash, atraumatic PSYCH:  Appropriate speech and behavior  *Additional and/or pertinent findings included in MDM below  Diagnostic and Interventional Summary    EKG Interpretation  Date/Time:  Thursday December 13 2019 16:21:40 EDT Ventricular Rate:  119 PR Interval:    QRS Duration: 107 QT Interval:  347 QTC Calculation: 489 R Axis:   -50 Text Interpretation: Atrial fibrillation Ventricular premature complex Left anterior fascicular block Minimal ST depression, lateral leads Borderline prolonged QT interval Confirmed by Gerlene Fee [phone]) on 12/13/2019 5:41:31 PM      Labs Reviewed  CBC - Abnormal; Notable for the following components:      Result Value   WBC 10.9 (*)    RBC 3.81 (*)    Hemoglobin 12.9 (*)    MCV 106.3 (*)    RDW 16.9 (*)    All other components within normal limits  COMPREHENSIVE METABOLIC PANEL - Abnormal; Notable for the following components:   Albumin 3.1 (*)    All other components within normal limits  BRAIN NATRIURETIC PEPTIDE - Abnormal; Notable for the following components:   B Natriuretic Peptide 250.1 (*)    All other components within normal limits  PROTIME-INR - Abnormal; Notable for the following components:   Prothrombin Time 15.6 (*)    INR 1.3 (*)    All other components within normal limits  TROPONIN I (HIGH SENSITIVITY) - Abnormal; Notable for the following components:   Troponin I (High Sensitivity) 20 (*)    All other components within normal limits  TROPONIN I (HIGH SENSITIVITY) -  Abnormal; Notable for the following components:   Troponin I (High Sensitivity) 20 (*)    All other components within normal limits  RESPIRATORY PANEL BY RT PCR (FLU A&B, COVID)    DG Chest Port 1 View  Final Result    VAS [place] LOWER EXTREMITY VENOUS (DVT)    (Results Pending)    Medications  furosemide (LASIX) injection 40 mg (40 mg Intravenous Given 12/13/19 1502)     Procedures  /  Critical Care Procedures  ED Course and Medical Decision Making  I have reviewed the triage vital signs, the nursing notes, and pertinent available records from the EMR.  Listed above are laboratory and imaging tests that I personally ordered, reviewed, and interpreted and then considered in my medical decision making (see below for details).  Suspect CHF exacerbation, awaiting labs, chest x-ray.  Currently with normal vital signs and on room air, may be a candidate for dose of Lasix and discharge with close follow-up.     Upon further discussion with patient and patient's daughter, his functional status is quite poor and would likely benefit from admission and diuresis and an updated home medication plan.  Accepted for admission by hospitalist service.  Barth Kirks. Sedonia Small, [place] [email or web address]  Final Clinical Impressions(s) / ED Diagnoses     ICD-10-CM   1. Acute on chronic congestive heart failure, unspecified heart failure type ([place])  I50.9     ED Discharge Orders    None       Discharge Instructions Discussed with and Provided to Patient:   Discharge Instructions   None       Maudie Flakes, MD 12/13/19 1742

## 2019-12-14 ENCOUNTER — Inpatient Hospital Stay (HOSPITAL_COMMUNITY): Payer: Medicare Other

## 2019-12-14 DIAGNOSIS — I5043 Acute on chronic combined systolic (congestive) and diastolic (congestive) heart failure: Secondary | ICD-10-CM | POA: Diagnosis not present

## 2019-12-14 DIAGNOSIS — M7989 Other specified soft tissue disorders: Secondary | ICD-10-CM

## 2019-12-14 LAB — BASIC METABOLIC PANEL
Anion gap: 11 (ref 5–15)
BUN: 20 mg/dL (ref 8–23)
CO2: 26 mmol/L (ref 22–32)
Calcium: 9.6 mg/dL (ref 8.9–10.3)
Chloride: 100 mmol/L (ref 98–111)
Creatinine, Ser: 0.97 mg/dL (ref 0.61–1.24)
GFR, Estimated: >60 mL/min (ref >60)
Glucose, Bld: 127 mg/dL — ABNORMAL HIGH (ref 70–99)
Potassium: 3.9 mmol/L (ref 3.5–5.1)
Sodium: 137 mmol/L (ref 135–145)

## 2019-12-14 LAB — ECHOCARDIOGRAM COMPLETE
AR max vel: 0.94 cm2
AV Area VTI: 0.87 cm2
AV Area mean vel: 0.93 cm2
AV Mean grad: 10 mmHg
AV Peak grad: 18.4 mmHg
Ao pk vel: 2.15 m/s
Area-P 1/2: 4.68 cm2
Height: 66 in
MV VTI: 1.19 cm2
S' Lateral: 3.7 cm
Weight: 3841.3 oz

## 2019-12-14 MED ORDER — METOPROLOL TARTRATE 25 MG PO TABS
25.0000 mg | ORAL_TABLET | Freq: Two times a day (BID) | ORAL | Status: DC
  Administered 2019-12-14: 25 mg via ORAL
  Filled 2019-12-14 (×2): qty 1

## 2019-12-14 MED ORDER — ISOSORBIDE MONONITRATE ER 30 MG PO TB24
15.0000 mg | ORAL_TABLET | Freq: Every day | ORAL | Status: DC
  Administered 2019-12-15 – 2019-12-16 (×2): 15 mg via ORAL
  Filled 2019-12-14 (×2): qty 1

## 2019-12-14 MED ORDER — HYDROXYZINE HCL 50 MG/ML IM SOLN
25.0000 mg | Freq: Once | INTRAMUSCULAR | Status: AC
  Administered 2019-12-14: 25 mg via INTRAMUSCULAR
  Filled 2019-12-14: qty 0.5

## 2019-12-14 NOTE — Progress Notes (Signed)
Bilateral lower extremity venous duplex has been completed. Preliminary results can be found in CV Proc through chart review.   12/14/19 10:19 AM Larry Church RVT

## 2019-12-14 NOTE — Progress Notes (Signed)
PROGRESS NOTE    Larry Church  CVE:[id number] DOB: [birth year] DOA: 12/13/2019 PCP: Lilian Coma., MD    Brief Narrative:  86 year old gentleman with history of dementia, A. fib on Eliquis, coronary artery disease, chronic systolic heart failure with known ejection fraction 45%, moderate aortic stenosis, hyperlipidemia, history of subarachnoid hemorrhage but recently put back on Eliquis presented to PCP office with right leg swelling and weight gain and was sent to ER.  Recently diagnosed with UTI and on Keflex.  In the emergency room he was tachycardic.  Afebrile.  On room air.  BNP 250.  Troponin borderline elevated.  Chest x-ray with bilateral interstitial prominence.  He was admitted as CHF exacerbation.   Assessment & Plan:   Active Problems:   CAD (coronary artery disease)   BPH (benign prostatic hyperplasia)   Moderate aortic stenosis by prior echocardiography   DOE (dyspnea on exertion)   Acute on chronic combined systolic and diastolic CHF (congestive heart failure) (HCC)   E. coli UTI  Acute on chronic combined heart failure: Presents with orthopnea, deconditioning and weight gain.  No clinical evidence of overt fluid overload. Known EF 45%.  Repeat echocardiogram today. At home on Lasix 20 mg 3 times a day, currently on Lasix 40 mg IV twice a day. Already on beta-blockers and nitrates. Continue IV Lasix, intake and output monitoring.  Will try to achieve negative fluid balance. Will discuss with cardiology for consultation.  A. fib with RVR: Suboptimal rate control.  Increasing metoprolol dose today.  On Eliquis and therapeutic.  Right leg cellulitis: Duplex studies negative for DVT.  Already therapeutic on Eliquis.  No evidence of abscess.  We will continue Keflex for 1 week.  E. coli UTI: On Keflex, will extend therapy 1 more week because of cellulitis.  Coronary artery disease: With chronic ischemic cardiomyopathy.  Currently denies any chest pain.  No evidence  of acute coronary syndrome.  Patient on aspirin, statin, nitrates and beta-blockers.  Dementia: He will have difficulty in the hospital, may get more confused and sundowning.  We will try to send him home as soon as possible.   DVT prophylaxis:  apixaban (ELIQUIS) tablet 5 mg   Code Status: Full code Family Communication: Daughter [name] at the bedside.  We discussed about sending him home as soon as possible when medically stable to avoid further deterioration of his behavior. Disposition Plan: Status is: Inpatient  Remains inpatient appropriate because:Inpatient level of care appropriate due to severity of illness   Dispo: The patient is from: Home              Anticipated d/c is to: Home              Anticipated d/c date is: 2 days              Patient currently is not medically stable to d/c.         Consultants:   Cardiology  Procedures:   None  Antimicrobials:   Keflex, 10/24---   Subjective: Patient seen and examined.  Pleasantly confused.  He does not know why he came to the hospital.  He cannot tell me anything about his congestive heart failure. Came back to talk to patient's daughter at the bedside.  We discussed in detail about patient's current condition and his poor heart condition. Patient's daughter is requesting to see if we can send him home so that his sundowning does not get exacerbated.  Objective: Vitals:   12/13/19 2033  12/13/19 2035 12/14/19 0638 12/14/19 1000  BP: 129/80  118/80   Pulse: (!) 116  (!) 110   Resp: 20  (!) 22   Temp: 97.6 F (36.4 C)  98.2 F (36.8 C)   TempSrc: Oral  Oral   SpO2: 94%  93% 95%  Weight:  108.9 kg    Height:        Intake/Output Summary (Last 24 hours) at 12/14/2019 1117 Last data filed at 12/14/2019 0500 Gross per 24 hour  Intake 550 ml  Output 400 ml  Net 150 ml   Filed Weights   12/13/19 1134 12/13/19 2035  Weight: 108.9 kg 108.9 kg    Examination:  General exam: Appears calm and  comfortable  Pleasantly confused gentleman not in distress.  Behavior is well controlled. Alert and oriented x2. Respiratory system: Some basal crackles most on the posterior lung field.  Respiratory effort normal.  Mostly on room air. Cardiovascular system: S1 & S2 heard, irregularly irregular.  Tachycardic. Left leg with trace edema. Right shin with erythema and 2+ edema.  No underlying fluctuation. Gastrointestinal system: Abdomen is nondistended, soft and nontender. No organomegaly or masses felt. Normal bowel sounds heard. Central nervous system: Alert and oriented x2.  No focal deficits.   Data Reviewed: I have personally reviewed following labs and imaging studies  CBC: Recent Labs  Lab 12/13/19 1215  WBC 10.9*  HGB 12.9*  HCT 40.5  MCV 106.3*  PLT 858   Basic Metabolic Panel: Recent Labs  Lab 12/13/19 1215 12/13/19 1507 12/14/19 0340  NA 136  --  137  K 4.1  --  3.9  CL 101  --  100  CO2 27  --  26  GLUCOSE 90  --  127*  BUN 20  --  20  CREATININE 1.00  --  0.97  CALCIUM 9.2  --  9.6  MG  --  2.4  --    GFR: Estimated Creatinine Clearance: 65.6 mL/min (by C-G formula based on SCr of 0.97 mg/dL). Liver Function Tests: Recent Labs  Lab 12/13/19 1215  AST 29  ALT 29  ALKPHOS 78  BILITOT 0.8  PROT 7.2  ALBUMIN 3.1*   No results for input(s): LIPASE, AMYLASE in the last 168 hours. No results for input(s): AMMONIA in the last 168 hours. Coagulation Profile: Recent Labs  Lab 12/13/19 1215  INR 1.3*   Cardiac Enzymes: No results for input(s): CKTOTAL, CKMB, CKMBINDEX, TROPONINI in the last 168 hours. BNP (last 3 results) No results for input(s): PROBNP in the last 8760 hours. HbA1C: No results for input(s): HGBA1C in the last 72 hours. CBG: Recent Labs  Lab 12/09/19 2029  GLUCAP 115*   Lipid Profile: No results for input(s): CHOL, HDL, LDLCALC, TRIG, CHOLHDL, LDLDIRECT in the last 72 hours. Thyroid Function Tests: No results for input(s):  TSH, T4TOTAL, FREET4, T3FREE, THYROIDAB in the last 72 hours. Anemia Panel: No results for input(s): VITAMINB12, FOLATE, FERRITIN, TIBC, IRON, RETICCTPCT in the last 72 hours. Sepsis Labs: No results for input(s): PROCALCITON, LATICACIDVEN in the last 168 hours.  Recent Results (from the past 240 hour(s))  Urine culture     Status: Abnormal   Collection Time: 12/09/19  8:10 PM   Specimen: Urine, Random  Result Value Ref Range Status   Specimen Description   Final    URINE, RANDOM Performed at [facility] [address], [place], [place] [address]    Special Requests   Final  NONE Performed at [facility], [place] [address], [place], [place] [address]    Culture >=100,000 COLONIES/mL ESCHERICHIA COLI (A)  Final   Report Status 12/12/2019 FINAL  Final   Organism ID, Bacteria ESCHERICHIA COLI (A)  Final      Susceptibility   Escherichia coli - MIC*    AMPICILLIN >=32 RESISTANT Resistant     CEFAZOLIN <=4 SENSITIVE Sensitive     CEFTRIAXONE <=0.25 SENSITIVE Sensitive     CIPROFLOXACIN <=0.25 SENSITIVE Sensitive     GENTAMICIN <=1 SENSITIVE Sensitive     IMIPENEM <=0.25 SENSITIVE Sensitive     NITROFURANTOIN <=16 SENSITIVE Sensitive     TRIMETH/SULFA <=20 SENSITIVE Sensitive     AMPICILLIN/SULBACTAM 16 INTERMEDIATE Intermediate     PIP/TAZO <=4 SENSITIVE Sensitive     * >=100,000 COLONIES/mL ESCHERICHIA COLI  Respiratory Panel by RT PCR (Flu A&B, Covid) - Nasopharyngeal Swab     Status: None   Collection Time: 12/13/19  6:14 PM   Specimen: Nasopharyngeal Swab  Result Value Ref Range Status   SARS Coronavirus 2 by RT PCR NEGATIVE NEGATIVE Final    Comment: (NOTE) SARS-CoV-2 target nucleic acids are NOT DETECTED.  The SARS-CoV-2 RNA is generally detectable in upper respiratoy specimens during the acute phase of infection. The lowest concentration of SARS-CoV-2 viral copies this assay can detect is 131 copies/mL. A negative result does not  preclude SARS-Cov-2 infection and should not be used as the sole basis for treatment or other patient management decisions. A negative result may occur with  improper specimen collection/handling, submission of specimen other than nasopharyngeal swab, presence of viral mutation(s) within the areas targeted by this assay, and inadequate number of viral copies (<131 copies/mL). A negative result must be combined with clinical observations, patient history, and epidemiological information. The expected result is Negative.  Fact Sheet for Patients:  PinkCheek.be  Fact Sheet for Healthcare Providers:  GravelBags.it  This test is no t yet approved or cleared by the [place] FDA and  has been authorized for detection and/or diagnosis of SARS-CoV-2 by FDA under an Emergency Use Authorization (EUA). This EUA will remain  in effect (meaning this test can be used) for the duration of the COVID-19 declaration under Section 564(b)(1) of the Act, 21 U.S.C. section 360bbb-3(b)(1), unless the authorization is terminated or revoked sooner.     Influenza A by PCR NEGATIVE NEGATIVE Final   Influenza B by PCR NEGATIVE NEGATIVE Final    Comment: (NOTE) The Xpert Xpress SARS-CoV-2/FLU/RSV assay is intended as an aid in  the diagnosis of influenza from Nasopharyngeal swab specimens and  should not be used as a sole basis for treatment. Nasal washings and  aspirates are unacceptable for Xpert Xpress SARS-CoV-2/FLU/RSV  testing.  Fact Sheet for Patients: PinkCheek.be  Fact Sheet for Healthcare Providers: GravelBags.it  This test is not yet approved or cleared by the [place] FDA and  has been authorized for detection and/or diagnosis of SARS-CoV-2 by  FDA under an Emergency Use Authorization (EUA). This EUA will remain  in effect (meaning this test can be used) for the  duration of the  Covid-19 declaration under Section 564(b)(1) of the Act, 21  U.S.C. section 360bbb-3(b)(1), unless the authorization is  terminated or revoked. Performed at [facility], [place] [address] [place], [place] [address]          Radiology Studies: [facility] Chest Port 1 View  Result Date: 12/13/2019 CLINICAL DATA:  Ankle swelling. EXAM: PORTABLE CHEST  1 VIEW COMPARISON:  11/07/2019. FINDINGS: Cardiomegaly. Bilateral interstitial prominence again noted. Left pleural effusion again noted. Findings suggest CHF. An active interstitial process such as pneumonitis cannot be excluded. Underlying chronic interstitial lung disease cannot be excluded. No pneumothorax. Prior cervical spine fusion. IMPRESSION: Cardiomegaly with bilateral interstitial prominence and left pleural effusion. Findings suggest CHF. An active interstitial process such as pneumonitis cannot be excluded. Underlying chronic interstitial lung disease cannot be excluded. Electronically Signed   By: Marcello Moores  Register   On: 12/13/2019 12:18   VAS [place] LOWER EXTREMITY VENOUS (DVT)  Result Date: 12/14/2019  Lower Venous DVT Study Indications: Swelling.  Risk Factors: None identified. Limitations: Body habitus, poor ultrasound/tissue interface and patient positioning. Comparison Study: No prior studies. Performing Technologist: Oliver Hum RVT  Examination Guidelines: A complete evaluation includes B-mode imaging, spectral Doppler, color Doppler, and power Doppler as needed of all accessible portions of each vessel. Bilateral testing is considered an integral part of a complete examination. Limited examinations for reoccurring indications may be performed as noted. The reflux portion of the exam is performed with the patient in reverse Trendelenburg.  +---------+---------------+---------+-----------+----------+--------------+ RIGHT    CompressibilityPhasicitySpontaneityPropertiesThrombus Aging  +---------+---------------+---------+-----------+----------+--------------+ CFV      Full           Yes      Yes                                 +---------+---------------+---------+-----------+----------+--------------+ SFJ      Full                                                        +---------+---------------+---------+-----------+----------+--------------+ FV Prox  Full                                                        +---------+---------------+---------+-----------+----------+--------------+ FV Mid   Full                                                        +---------+---------------+---------+-----------+----------+--------------+ FV DistalFull                                                        +---------+---------------+---------+-----------+----------+--------------+ PFV      Full                                                        +---------+---------------+---------+-----------+----------+--------------+ POP      Full           Yes      Yes                                 +---------+---------------+---------+-----------+----------+--------------+  PTV      Full                                                        +---------+---------------+---------+-----------+----------+--------------+ PERO     Full                                                        +---------+---------------+---------+-----------+----------+--------------+   +---------+---------------+---------+-----------+----------+--------------+ LEFT     CompressibilityPhasicitySpontaneityPropertiesThrombus Aging +---------+---------------+---------+-----------+----------+--------------+ CFV      Full           Yes      Yes                                 +---------+---------------+---------+-----------+----------+--------------+ SFJ      Full                                                         +---------+---------------+---------+-----------+----------+--------------+ FV Prox  Full                                                        +---------+---------------+---------+-----------+----------+--------------+ FV Mid   Full                                                        +---------+---------------+---------+-----------+----------+--------------+ FV DistalFull                                                        +---------+---------------+---------+-----------+----------+--------------+ PFV      Full                                                        +---------+---------------+---------+-----------+----------+--------------+ POP      Full           Yes      Yes                                 +---------+---------------+---------+-----------+----------+--------------+ PTV      Full                                                        +---------+---------------+---------+-----------+----------+--------------+   PERO                                                  Not visualized +---------+---------------+---------+-----------+----------+--------------+     Summary: RIGHT: - There is no evidence of deep vein thrombosis in the lower extremity. However, portions of this examination were limited- see technologist comments above.  - No cystic structure found in the popliteal fossa.  LEFT: - There is no evidence of deep vein thrombosis in the lower extremity. However, portions of this examination were limited- see technologist comments above.  - No cystic structure found in the popliteal fossa.  *See table(s) above for measurements and observations.    Preliminary         Scheduled Meds: . alfuzosin  10 mg Oral Q breakfast  . apixaban  5 mg Oral BID  . aspirin EC  81 mg Oral Daily  . atorvastatin  40 mg Oral Daily  . cephALEXin  500 mg Oral TID  . finasteride  5 mg Oral Daily  . furosemide  40 mg Intravenous BID  . gabapentin  100 mg  Oral BID  . [START ON 12/15/2019] isosorbide mononitrate  15 mg Oral Daily  . memantine  10 mg Oral BID  . metoprolol tartrate  25 mg Oral BID  . pantoprazole  40 mg Oral Daily  . sodium chloride flush  3 mL Intravenous Q12H  . venlafaxine XR  150 mg Oral Daily  . venlafaxine XR  75 mg Oral QHS   Continuous Infusions: . sodium chloride       LOS: 1 day    Time spent: 35 minutes    Barb Merino, MD [facility] Pager [phone]

## 2019-12-14 NOTE — TOC Progression Note (Addendum)
Transition of Care [facility]) - Progression Note    Patient Details  Name: Larry Church MRN: [id number] Date of Birth: [birth year]  Transition of [facility]) CM/SW Contact  Purcell Mouton, RN Phone Number: 12/14/2019, 1:56 PM  Clinical Narrative:    Spoke with pt's daughter at bedside concerning discharge plans. Pt from with spouse and 24/7 private duty care. Plan to discharge back home.   Expected Discharge Plan: [place] Barriers to Discharge: No Barriers Identified  Expected Discharge Plan and Services Expected Discharge Plan: [place] arrangements for the past 2 months: Single Family Home                                       Social Determinants of Health (SDOH) Interventions    Readmission Risk Interventions No flowsheet data found.

## 2019-12-14 NOTE — Plan of Care (Signed)
  Problem: Cardiac: Goal: Ability to achieve and maintain adequate cardiopulmonary perfusion will improve Outcome: Progressing   Problem: RH SAFETY Goal: RH STG ADHERE TO SAFETY PRECAUTIONS W/ASSISTANCE/DEVICE Description: STG Adhere to Safety Precautions With Assistance/Device. Outcome: Progressing Goal: RH STG DECREASED RISK OF FALL WITH ASSISTANCE Description: STG Decreased Risk of Fall With Assistance. Outcome: Progressing
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
## 2019-12-14 NOTE — Progress Notes (Signed)
*  PRELIMINARY RESULTS* Echocardiogram 2D Echocardiogram has been performed.  Larry Church 12/14/2019, 12:24 PM

## 2019-12-14 NOTE — Evaluation (Signed)
Physical Therapy Evaluation Patient Details Name: Larry Church MRN: [id number] DOB: [birth year] Today's Date: 12/14/2019   History of Present Illness  86 yo male with PMH of OSA, CAD, CHF, memory problems, anterior cervical discectomy/fusion 1998, L4-5 laminectomy/decompression 2014, L TKA 2016, bil THA, who presents with c/o bil leg swelling and weight gain.  Clinical Impression  Pt admitted with above diagnosis. PTA, pt with 24 hr care at home assisting with ADLs as needed, using electric WC in community, and ambulating limited household distances with rollator. Pt's family in room states pt appears to be close to baseline when ambulating with RW. Pt currently not requiring physical assist to rise or ambulate, but limited by HR and fatigue requiring seated rest breaks to recover. Pt currently with functional limitations due to the deficits listed below (see PT Problem List). Pt will benefit from skilled PT to increase their independence and safety with mobility to allow discharge to the venue listed below.       Follow Up Recommendations Home health PT;Supervision for mobility/OOB    Equipment Recommendations  Other (comment)    Recommendations for Other Services       Precautions / Restrictions Precautions Precautions: Fall;Other (comment) Precaution Comments: monitor HR Restrictions Weight Bearing Restrictions: No      Mobility  Bed Mobility  General bed mobility comments: in chair upon arrival    Transfers Overall transfer level: Needs assistance Equipment used: Rolling walker (2 wheeled) Transfers: Sit to/from Stand Sit to Stand: Min guard    General transfer comment: uses rocking momentum, BUE assisting to power up, min G to steady with rise and controlled lowering  Ambulation/Gait Ambulation/Gait assistance: Min guard Gait Distance (Feet): 10 Feet (x2 with seated rest break between) Assistive device: Rolling walker (2 wheeled) Gait Pattern/deviations:  Step-through pattern;Decreased stride length Gait velocity: decreased   General Gait Details: pt ambulates in room from recliner to chair, then seated rest break due to LBP and fatigue, then returns to recliner, no overt unsteadiness, slightly wide BOS with decreased flexion in swing and minimal bil foot clearance  Stairs            Wheelchair Mobility    Modified Rankin (Stroke Patients Only)       Balance Overall balance assessment: Needs assistance;History of Falls  Standing balance support: During functional activity;Bilateral upper extremity supported Standing balance-[name]: Poor Standing balance comment: reliant on UE support          Pertinent Vitals/Pain Pain Assessment: Faces ("normal back pain") Faces Pain Scale: Hurts a little bit Pain Location: low back with ambulation Pain Descriptors / Indicators: Aching Pain Intervention(s): Limited activity within patient's tolerance;Monitored during session    [place] expects to be discharged to:: Private residence Living Arrangements: Spouse/significant other Available Help at Discharge: Family;Home health;Available 24 hours/day (24 hr assist) Type of Home: House Home Access: Ramped entrance     Home Layout: One level Home Equipment: Clinical cytogeneticist - 4 wheels;Wheelchair - Education officer, community - power      Prior Function Level of Independence: Needs assistance   Gait / Transfers Assistance Needed: Pt independent with bed mobility. Pt using rollator in house and electric WC in community.  ADL's / Homemaking Assistance Needed: in home care provides cooking and cleaning, assist pt with bathing and dressing  Comments: Pt and spouse currently have 24 hr assist at home due to wife's recent hip surgery. Pt reports family provides transportation.. Pt's family reports most recent fall ~5 weeks ago, shuffles when  walks.     Hand Dominance        Extremity/Trunk Assessment   Upper Extremity  Assessment Upper Extremity Assessment: Overall WFL for tasks assessed    Lower Extremity Assessment Lower Extremity Assessment: Overall WFL for tasks assessed (AROM WNL, strength grossly 3+/5, denies numbness/tingling)    Cervical / Trunk Assessment Cervical / Trunk Assessment: Normal  Communication   Communication: No difficulties  Cognition Arousal/Alertness: Awake/alert Behavior During Therapy: WFL for tasks assessed/performed Overall Cognitive Status: History of cognitive impairments - at baseline  General Comments: Pt repeats questions often, able to follow one step commands consistently and timely. Pt's daughter assisting with PLOF and home setup.      General Comments General comments (skin integrity, edema, etc.): Pt on RA with SpO2 90-92% with mobility, HR 135bpm max noted with ambulation, 109 bpm at rest.    Exercises     Assessment/Plan    PT Assessment Patient needs continued PT services  PT Problem List Decreased activity tolerance;Decreased balance;Cardiopulmonary status limiting activity;Obesity       PT Treatment Interventions DME instruction;Gait training;Functional mobility training;Therapeutic activities;Therapeutic exercise;Balance training;Neuromuscular re-education;Patient/family education    PT Goals (Current goals can be found in the Care Plan section)  Acute Rehab PT Goals Patient Stated Goal: go back home PT Goal Formulation: With patient/family Time For Goal Achievement: 12/28/19 Potential to Achieve Goals: Good    Frequency Min 3X/week   Barriers to discharge        Co-evaluation               AM-PAC PT "6 Clicks" Mobility  Outcome Measure Help needed turning from your back to your side while in a flat bed without using bedrails?: A Little Help needed moving from lying on your back to sitting on the side of a flat bed without using bedrails?: A Little Help needed moving to and from a bed to a chair (including a wheelchair)?: A  Little Help needed standing up from a chair using your arms (e.g., wheelchair or bedside chair)?: A Little Help needed to walk in hospital room?: None Help needed climbing 3-5 steps with a railing? : A Lot 6 Click Score: 18    End of Session Equipment Utilized During Treatment: Gait belt Activity Tolerance: Patient tolerated treatment well;Patient limited by fatigue Patient left: in chair;with call bell/phone within reach;with chair alarm set;with family/visitor present Nurse Communication: Mobility status;Other (comment) (HR and SpO2) PT Visit Diagnosis: Other abnormalities of gait and mobility (R26.89);History of falling (Z91.81)    Time: 0258-5277 PT Time Calculation (min) (ACUTE ONLY): 28 min   Charges:   PT Evaluation $PT Eval Moderate Complexity: 1 Mod PT Treatments $Therapeutic Activity: 8-22 mins         Tori Torrance Frech PT, DPT 12/14/19, 11:36 AM

## 2019-12-15 DIAGNOSIS — I350 Nonrheumatic aortic (valve) stenosis: Secondary | ICD-10-CM

## 2019-12-15 DIAGNOSIS — I2510 Atherosclerotic heart disease of native coronary artery without angina pectoris: Secondary | ICD-10-CM | POA: Diagnosis not present

## 2019-12-15 DIAGNOSIS — R0600 Dyspnea, unspecified: Secondary | ICD-10-CM | POA: Diagnosis not present

## 2019-12-15 DIAGNOSIS — I050 Rheumatic mitral stenosis: Secondary | ICD-10-CM

## 2019-12-15 DIAGNOSIS — I5043 Acute on chronic combined systolic (congestive) and diastolic (congestive) heart failure: Secondary | ICD-10-CM

## 2019-12-15 DIAGNOSIS — I10 Essential (primary) hypertension: Secondary | ICD-10-CM

## 2019-12-15 LAB — BASIC METABOLIC PANEL
Anion gap: 12 (ref 5–15)
BUN: 27 mg/dL — ABNORMAL HIGH (ref 8–23)
CO2: 26 mmol/L (ref 22–32)
Calcium: 9.4 mg/dL (ref 8.9–10.3)
Chloride: 100 mmol/L (ref 98–111)
Creatinine, Ser: 1.34 mg/dL — ABNORMAL HIGH (ref 0.61–1.24)
GFR, Estimated: 52 mL/min — ABNORMAL LOW (ref >60)
Glucose, Bld: 123 mg/dL — ABNORMAL HIGH (ref 70–99)
Potassium: 3.5 mmol/L (ref 3.5–5.1)
Sodium: 138 mmol/L (ref 135–145)

## 2019-12-15 LAB — TSH: TSH: 2.077 u[IU]/mL (ref 0.350–4.500)

## 2019-12-15 MED ORDER — METOPROLOL TARTRATE 50 MG PO TABS
50.0000 mg | ORAL_TABLET | Freq: Two times a day (BID) | ORAL | Status: DC
  Administered 2019-12-15 – 2019-12-16 (×3): 50 mg via ORAL
  Filled 2019-12-15 (×3): qty 1

## 2019-12-15 NOTE — Progress Notes (Signed)
Progress Note  Patient Name: Larry Church Date of Encounter: 12/15/2019  [facility] Cardiologist: Dorris Carnes, MD   Subjective   Coughing quite a bit this am.  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 alfuzosin  10 mg Oral Q breakfast  . apixaban  5 mg Oral BID  . aspirin EC  81 mg Oral Daily  . atorvastatin  40 mg Oral Daily  . cephALEXin  500 mg Oral TID  . finasteride  5 mg Oral Daily  . gabapentin  100 mg Oral BID  . isosorbide mononitrate  15 mg Oral Daily  . memantine  10 mg Oral BID  . metoprolol tartrate  25 mg Oral BID  . pantoprazole  40 mg Oral Daily  . sodium chloride flush  3 mL Intravenous Q12H  . venlafaxine XR  150 mg Oral Daily  . venlafaxine XR  75 mg Oral QHS   Continuous Infusions: . sodium chloride     PRN Meds: sodium chloride, acetaminophen, nitroGLYCERIN, ondansetron (ZOFRAN) IV, sodium chloride flush, traMADol   Vital Signs    Vitals:   12/14/19 1152 12/14/19 2125 12/15/19 0500 12/15/19 0637  BP: 114/79 (!) 144/111  (!) 153/110  Pulse: 97 66  (!) 103  Resp: (!) 21   (!) 21  Temp: 98.3 F (36.8 C) 98.2 F (36.8 C)  97.9 F (36.6 C)  TempSrc: Oral Oral  Oral  SpO2: 95% 90%  96%  Weight:   108 kg   Height:        Intake/Output Summary (Last 24 hours) at 12/15/2019 0823 Last data filed at 12/15/2019 1610 Gross per 24 hour  Intake 1215 ml  Output 880 ml  Net 335 ml   Last 3 Weights 12/15/2019 12/13/2019 12/13/2019  Weight (lbs) 238 lb 1.6 oz 240 lb 1.3 oz 240 lb  Weight (kg) 108 kg 108.9 kg 108.863 kg      Telemetry    Atrial fibrillation with CVR - Personally Reviewed  ECG    No new EKG to review - Personally Reviewed  Physical Exam   GEN: No acute distress.   Neck: No JVD Cardiac: irregularly irregular, no murmurs, rubs, or gallops.  Respiratory: Clear to auscultation bilaterally. GI: Soft, nontender, non-distended  MS: No edema;  No deformity. Neuro:  Nonfocal  Psych: Normal affect   Labs    High Sensitivity Troponin:   Recent Labs  Lab 12/13/19 1215 12/13/19 1507  TROPONINIHS 20* 20*      Chemistry Recent Labs  Lab 12/13/19 1215 12/14/19 0340 12/15/19 0421  NA 136 137 138  K 4.1 3.9 3.5  CL 101 100 100  CO2 27 26 26   GLUCOSE 90 127* 123*  BUN 20 20 27*  CREATININE 1.00 0.97 1.34*  CALCIUM 9.2 9.6 9.4  PROT 7.2  --   --   ALBUMIN 3.1*  --   --   AST 29  --   --   ALT 29  --   --   ALKPHOS 78  --   --   BILITOT 0.8  --   --   GFRNONAA >60 >60 52*  ANIONGAP 8 11 12      Hematology Recent Labs  Lab 12/13/19 1215  WBC 10.9*  RBC 3.81*  HGB 12.9*  HCT 40.5  MCV 106.3*  MCH 33.9  MCHC 31.9  RDW 16.9*  PLT 278  BNP Recent Labs  Lab 12/13/19 1215  BNP 250.1*     DDimer No results for input(s): DDIMER in the last 168 hours.    CHA2DS2-VASc Score = 5  This indicates a 7.2% annual risk of stroke. The patient's score is based upon: CHF History: 1 HTN History: 1 Diabetes History: 0 Stroke History: 0 Vascular Disease History: 1 Age Score: 2 Gender Score: 0      Radiology    CT Chest High Resolution  Result Date: 12/14/2019 CLINICAL DATA:  Chronic dyspnea. Interstitial lung disease suspected. EXAM: CT CHEST WITHOUT CONTRAST TECHNIQUE: Multidetector CT imaging of the chest was performed following the standard protocol without intravenous contrast. High resolution imaging of the lungs, as well as inspiratory and expiratory imaging, was performed. COMPARISON:  Chest radiograph from one day prior. 05/01/2014 chest CT angiogram. FINDINGS: Cardiovascular: Stable mild cardiomegaly. No significant pericardial effusion/thickening. Three-vessel coronary atherosclerosis. Atherosclerotic nonaneurysmal thoracic aorta. Normal caliber pulmonary arteries. Mediastinum/Nodes: No discrete thyroid nodules. Unremarkable esophagus. No pathologically enlarged axillary, mediastinal or hilar lymph  nodes, noting limited sensitivity for the detection of hilar adenopathy on this noncontrast study. Lungs/Pleura: No pneumothorax. No pleural effusion. No acute consolidative airspace disease, lung masses or significant pulmonary nodules. No significant air trapping or evidence of tracheobronchomalacia on the expiration sequence. There is extensive patchy confluent subpleural reticulation and ground-glass opacity throughout both lungs with associated moderate traction bronchiectasis, architectural distortion and volume loss. There is a basilar predominance to these findings. No frank honeycombing. Findings have clearly progressed in the interval since 05/01/2014 chest CT. Upper abdomen: Cholecystectomy. Musculoskeletal: No aggressive appearing focal osseous lesions. Partially visualized surgical hardware from ACDF. Partially visualized vertebroplasty change at the chronic L1 vertebral compression fracture. Mild T10 vertebral compression fracture appears chronic, although is new since 05/01/2014 chest CT. Marked thoracic spondylosis. IMPRESSION: 1. Spectrum of findings compatible with basilar predominant fibrotic interstitial lung disease without frank honeycombing, clearly progressed in the interval since 05/01/2014 chest CT. Findings are categorized as probable UIP per consensus guidelines: Diagnosis of Idiopathic Pulmonary Fibrosis: An Official ATS/ERS/JRS/ALAT Clinical Practice Guideline. [place], Iss 5, [phone], Oct 16 2016. 2. Stable mild cardiomegaly. Three-vessel coronary atherosclerosis. 3. Mild T10 vertebral compression fracture is new since 05/01/2014 chest CT and of uncertain chronicity, favor chronic. 4. Aortic Atherosclerosis (ICD10-I70.0). Electronically Signed   By: Ilona Sorrel M.D.   On: 12/14/2019 14:31   DG Chest Port 1 View  Result Date: 12/13/2019 CLINICAL DATA:  Ankle swelling. EXAM: PORTABLE CHEST 1 VIEW COMPARISON:  11/07/2019. FINDINGS: Cardiomegaly. Bilateral  interstitial prominence again noted. Left pleural effusion again noted. Findings suggest CHF. An active interstitial process such as pneumonitis cannot be excluded. Underlying chronic interstitial lung disease cannot be excluded. No pneumothorax. Prior cervical spine fusion. IMPRESSION: Cardiomegaly with bilateral interstitial prominence and left pleural effusion. Findings suggest CHF. An active interstitial process such as pneumonitis cannot be excluded. Underlying chronic interstitial lung disease cannot be excluded. Electronically Signed   By: Marcello Moores  Register   On: 12/13/2019 12:18   ECHOCARDIOGRAM COMPLETE  Result Date: 12/14/2019    ECHOCARDIOGRAM REPORT   Patient Name:   MAYJOR AGER Date of Exam: 12/14/2019 Medical Rec #:  [id number]      Height:       66.0 in Accession #:    [id number]     Weight:       240.1 lb Date of Birth:  1932-12-11     BSA:  2.161 m Patient Age:    86 years       BP:           114/79 mmHg Patient Gender: M              HR:           113 bpm. Exam Location:  Inpatient Procedure: 2D Echo, Cardiac Doppler and Color Doppler Indications:    CHF  History:        Patient has prior history of Echocardiogram examinations, most                 recent 08/29/2018. CHF, Aortic Valve Disease, Arrythmias:Atrial                 Fibrillation, Signs/Symptoms:Shortness of Breath and Altered                 Mental Status; Risk Factors:Dyslipidemia.  Sonographer:    Dustin Flock Referring Phys: [id number] [place]  1. There is severe aortic stenosis. V max 2.1 m/s, MG 10 mmHG, AVA 0.87 cm2, DI 0.25. This is low-flow low-gradient AS in the setting of LV dysfunction. SVI=15 cc/m2. Consider aortic valve calcium score for clarification. The aortic valve is tricuspid. Aortic valve regurgitation is trivial. Severe aortic valve stenosis.  2. Moderate to severe calcific mitral stenosis. MG 2.9 mmHG with MVA 1.19 cm2 by VTI. Gradient also lower than expected due to low flow state  (SVI=15 cc/m2). The mitral valve is degenerative. Trivial mitral valve regurgitation. Moderate to severe mitral  stenosis. Moderate to severe mitral annular calcification.  3. Left ventricular ejection fraction, by estimation, is 40 to 45%. The left ventricle has mildly decreased function. The left ventricle demonstrates global hypokinesis. There is mild concentric left ventricular hypertrophy. Left ventricular diastolic function could not be evaluated.  4. Right ventricular systolic function is mildly reduced. The right ventricular size is normal. There is normal pulmonary artery systolic pressure. The estimated right ventricular systolic pressure is 87.5 mmHg.  5. Left atrial size was mildly dilated.  6. The inferior vena cava is normal in size with greater than 50% respiratory variability, suggesting right atrial pressure of 3 mmHg. Comparison(s): Prior images unable to be directly viewed, comparison made by report only. Changes from prior study are noted. EF 40-45%. Low-flow low-gradient severe aortic stenosis is present. Moderate to severe calcific mitral stenosis is present. FINDINGS  Left Ventricle: Left ventricular ejection fraction, by estimation, is 40 to 45%. The left ventricle has mildly decreased function. The left ventricle demonstrates global hypokinesis. The left ventricular internal cavity size was normal in size. There is  mild concentric left ventricular hypertrophy. Left ventricular diastolic function could not be evaluated due to atrial fibrillation. Left ventricular diastolic function could not be evaluated. Right Ventricle: The right ventricular size is normal. No increase in right ventricular wall thickness. Right ventricular systolic function is mildly reduced. There is normal pulmonary artery systolic pressure. The tricuspid regurgitant velocity is 2.46 m/s, and with an assumed right atrial pressure of 3 mmHg, the estimated right ventricular systolic pressure is 64.3 mmHg. Left Atrium: Left  atrial size was mildly dilated. Right Atrium: Right atrial size was normal in size. Pericardium: Trivial pericardial effusion is present. Presence of pericardial fat pad. Mitral Valve: Moderate to severe calcific mitral stenosis. MG 2.9 mmHG with MVA 1.19 cm2 by VTI. Gradient also lower than expected due to low flow state (SVI=15 cc/m2). The mitral valve is degenerative in appearance. Moderate to severe  mitral annular calcification. Trivial mitral valve regurgitation. Moderate to severe mitral valve stenosis. The mean mitral valve gradient is 2.9 mmHg with average heart rate of 95 bpm. Tricuspid Valve: The tricuspid valve is grossly normal. Tricuspid valve regurgitation is trivial. No evidence of tricuspid stenosis. Aortic Valve: There is severe aortic stenosis. V max 2.1 m/s, MG 10 mmHG, AVA 0.87 cm2, DI 0.25. This is low-flow low-gradient AS in the setting of LV dysfunction. SVI=15 cc/m2. Consider aortic valve calcium score for clarification. The aortic valve is tricuspid. Aortic valve regurgitation is trivial. Severe aortic stenosis is present. Aortic valve mean gradient measures 10.0 mmHg. Aortic valve peak gradient measures 18.4 mmHg. Aortic valve area, by VTI measures 0.87 cm. Pulmonic Valve: The pulmonic valve was grossly normal. Pulmonic valve regurgitation is not visualized. No evidence of pulmonic stenosis. Aorta: The aortic root is normal in size and structure. Venous: The inferior vena cava is normal in size with greater than 50% respiratory variability, suggesting right atrial pressure of 3 mmHg. IAS/Shunts: The atrial septum is grossly normal.  LEFT VENTRICLE PLAX 2D LVIDd:         4.80 cm  Diastology LVIDs:         3.70 cm  LV e' medial:    7.29 cm/s LV PW:         1.40 cm  LV E/e' medial:  15.6 LV IVS:        1.40 cm  LV e' lateral:   7.51 cm/s LVOT diam:     2.10 cm  LV E/e' lateral: 15.2 LV SV:         33 LV SV Index:   15 LVOT Area:     3.46 cm  RIGHT VENTRICLE RV Basal diam:  2.90 cm RV S  prime:     3.26 cm/s TAPSE (M-mode): 2.5 cm LEFT ATRIUM             Index       RIGHT ATRIUM           Index LA diam:        5.00 cm 2.31 cm/m  RA Area:     19.40 cm LA Vol (A2C):   83.4 ml 38.59 ml/m RA Volume:   55.20 ml  25.54 ml/m LA Vol (A4C):   88.4 ml 40.90 ml/m LA Biplane Vol: 87.3 ml 40.39 ml/m  AORTIC VALVE AV Area (Vmax):    0.94 cm AV Area (Vmean):   0.93 cm AV Area (VTI):     0.87 cm AV Vmax:           214.50 cm/s AV Vmean:          145.000 cm/s AV VTI:            0.385 m AV Peak Grad:      18.4 mmHg AV Mean Grad:      10.0 mmHg LVOT Vmax:         58.40 cm/s LVOT Vmean:        39.000 cm/s LVOT VTI:          0.097 m LVOT/AV VTI ratio: 0.25  AORTA Ao Root diam: 3.40 cm MITRAL VALVE                TRICUSPID VALVE MV Area (PHT): 4.68 cm     TR Peak grad:   24.2 mmHg MV Area VTI:   1.19 cm     TR Vmax:        246.00 cm/s MV  Mean grad:  2.9 mmHg MV VTI:        0.28 m       SHUNTS MV Decel Time: 162 msec     Systemic VTI:  0.10 m MV E velocity: 114.00 cm/s  Systemic Diam: 2.10 cm Eleonore Chiquito MD Electronically signed by Eleonore Chiquito MD Signature Date/Time: 12/14/2019/4:03:12 PM    Final    VAS [place] LOWER EXTREMITY VENOUS (DVT)  Result Date: 12/14/2019  Lower Venous DVT Study Indications: Swelling.  Risk Factors: None identified. Limitations: Body habitus, poor ultrasound/tissue interface and patient positioning. Comparison Study: No prior studies. Performing Technologist: Oliver Hum RVT  Examination Guidelines: A complete evaluation includes B-mode imaging, spectral Doppler, color Doppler, and power Doppler as needed of all accessible portions of each vessel. Bilateral testing is considered an integral part of a complete examination. Limited examinations for reoccurring indications may be performed as noted. The reflux portion of the exam is performed with the patient in reverse Trendelenburg.  +---------+---------------+---------+-----------+----------+--------------+ RIGHT     CompressibilityPhasicitySpontaneityPropertiesThrombus Aging +---------+---------------+---------+-----------+----------+--------------+ CFV      Full           Yes      Yes                                 +---------+---------------+---------+-----------+----------+--------------+ SFJ      Full                                                        +---------+---------------+---------+-----------+----------+--------------+ FV Prox  Full                                                        +---------+---------------+---------+-----------+----------+--------------+ FV Mid   Full                                                        +---------+---------------+---------+-----------+----------+--------------+ FV DistalFull                                                        +---------+---------------+---------+-----------+----------+--------------+ PFV      Full                                                        +---------+---------------+---------+-----------+----------+--------------+ POP      Full           Yes      Yes                                 +---------+---------------+---------+-----------+----------+--------------+ PTV  Full                                                        +---------+---------------+---------+-----------+----------+--------------+ PERO     Full                                                        +---------+---------------+---------+-----------+----------+--------------+   +---------+---------------+---------+-----------+----------+--------------+ LEFT     CompressibilityPhasicitySpontaneityPropertiesThrombus Aging +---------+---------------+---------+-----------+----------+--------------+ CFV      Full           Yes      Yes                                 +---------+---------------+---------+-----------+----------+--------------+ SFJ      Full                                                         +---------+---------------+---------+-----------+----------+--------------+ FV Prox  Full                                                        +---------+---------------+---------+-----------+----------+--------------+ FV Mid   Full                                                        +---------+---------------+---------+-----------+----------+--------------+ FV DistalFull                                                        +---------+---------------+---------+-----------+----------+--------------+ PFV      Full                                                        +---------+---------------+---------+-----------+----------+--------------+ POP      Full           Yes      Yes                                 +---------+---------------+---------+-----------+----------+--------------+ PTV      Full                                                        +---------+---------------+---------+-----------+----------+--------------+  PERO                                                  Not visualized +---------+---------------+---------+-----------+----------+--------------+     Summary: RIGHT: - There is no evidence of deep vein thrombosis in the lower extremity. However, portions of this examination were limited- see technologist comments above.  - No cystic structure found in the popliteal fossa.  LEFT: - There is no evidence of deep vein thrombosis in the lower extremity. However, portions of this examination were limited- see technologist comments above.  - No cystic structure found in the popliteal fossa.  *See table(s) above for measurements and observations. Electronically signed by Deitra Mayo MD on 12/14/2019 at 1:02:56 PM.    Final     Cardiac Studies   2D echo IMPRESSIONS    1. There is severe aortic stenosis. V max 2.1 m/s, MG 10 mmHG, AVA 0.87  cm2, DI 0.25. This is low-flow low-gradient AS in the setting of LV    dysfunction. SVI=15 cc/m2. Consider aortic valve calcium score for  clarification. The aortic valve is tricuspid.  Aortic valve regurgitation is trivial. Severe aortic valve stenosis.  2. Moderate to severe calcific mitral stenosis. MG 2.9 mmHG with MVA 1.19  cm2 by VTI. Gradient also lower than expected due to low flow state  (SVI=15 cc/m2). The mitral valve is degenerative. Trivial mitral valve  regurgitation. Moderate to severe mitral  stenosis. Moderate to severe mitral annular calcification.  3. Left ventricular ejection fraction, by estimation, is 40 to 45%. The  left ventricle has mildly decreased function. The left ventricle  demonstrates global hypokinesis. There is mild concentric left ventricular  hypertrophy. Left ventricular diastolic  function could not be evaluated.  4. Right ventricular systolic function is mildly reduced. The right  ventricular size is normal. There is normal pulmonary artery systolic  pressure. The estimated right ventricular systolic pressure is 63.8 mmHg.  5. Left atrial size was mildly dilated.  6. The inferior vena cava is normal in size with greater than 50%  respiratory variability, suggesting right atrial pressure of 3 mmHg.   Comparison(s): Prior images unable to be directly viewed, comparison made  by report only. Changes from prior study are noted. EF 40-45%. Low-flow  low-gradient severe aortic stenosis is present. Moderate to severe  calcific mitral stenosis is present.   Cath 05/21/6597  LV end diastolic pressure is normal. Right heart cath pressures are normal  There is moderate left ventricular systolic dysfunction. Based on cardiac output-index (Fick)  Ramus lesion is 85% stenosed. Calcified, ostial lesion  Mid LAD lesion is 50% stenosed. Dist LAD lesion is 65% stenosed. -Calcified lesions.  Moderate aortic stenosis  SUMMARY  Severe single-vessel disease involving calcified, ostial RAMUS INTERMEDIUS 85% as well as  moderate lesions in the proximal and mid LAD that are heavily calcified -> neither lesion is febrile for PCI given location and calcification  Relatively normal RIGHT HEART CATH PRESSURES and LV EDP.  Moderate aortic valve stenosis with mean gradient of 10 mmHg.  Moderately reduced Cardiac function with a CARDIAC OUTPUT 4.4, CARDIAC INDEX 2.04   RECOMMENDATION  Discharge home after bedrest  For now would opt for medical management of the RAMUS INTERMEDIUS lesion (would not be amenable for stent placement, would only be amenable for PTCA which given the extent of calcification and proximity  to large LAD and circumflex make it less than favorable.  We will add Imdur 30 mg daily  May consider adding low-dose diuretic, and converting to long-acting Toprol from metoprolol tartrate given reduced cardiac output  Provided symptoms can be controlled medically, should not adversely affect hopes for shoulder surgery. FOR FUTURE CARDIAC CATHETERIZATION, WOULD NOT USE RADIAL ACCESS.  Patient Profile     86 y.o. male with a hx of CAD, moderate ischemic cardiomyopathy, persistent atrial fibrillation, history of subarachnoid bleed, aortic stenosis, hypertension and hyperlipidemia who is being seen  for the evaluation of CHF at the request of Dr. Sloan Leiter.  Assessment & Plan    1. Chronic dyspnea on exertion       -The degree of dyspnea on exertion cannot fully explained by the degree of volume overload.  He clearly is deconditioning due to sedentary lifestyle.  On physical exam, he also has very fine crackles in the base of the lung, we suspect patient likely has a component of pulmonary fibrosis  -HRCT confirmed presence of ILD with probable UIP  -recommend pulmonary consult  -2D echo showed severe low flow low gradient AS and moderate MS which are also likely playing a role   -afib with RVR also likely playing a role  -2D echo also showed mild LV dysfunction but likely not as major a player in  SOB as AS/MS and ILD  2. Acute on chronic combined systolic and diastolic heart failure       -He does not appear to be significantly volume overloaded.    -Chest CT with no mention of edema  -he has trivial LE edema likely related to severe AS as well as low albumin and chronic venous insuff  -he put out 880cc yesterday and is net +485cc  -SCr has bumped with diuresis and likely not significantly volume overloaded  -add TED hose for LE edema which is minimal  -stop diuretics  -BMET in am   3. CAD: Last cardiac catheterization in 2020 showed 85% ramus lesion, moderate LAD lesion, this was managed medically  -could possibly have worsening of his LAD stenosis but suspect primary pulmonary issues are main factor in his SOB  -continue ASA, statin and BB  4. Persistent atrial fibrillation with RVR:   -although he has been compliant with home metoprolol dosing, heart rate is not very well controlled during this admission.    -lopressor increased to 25 mg twice daily yesterday and HR   -increase Lopressor to 50mg  BID for better HR control>>will also help with poorly controlled HTN  -continue Eliquis  5. History of subarachnoid hematoma:   -He was not on any anticoagulation therapy for a long time due to history of subarachnoid hematoma, Eliquis was started in August.  6. Aortic stenosis:   -Previous echo obtained at [facility] in 2020 suggested severe aortic stenosis, however this appeared to be more moderate on the cardiac catheterization  -repeat echo shows severe low flow low gradient AS and moderate MS  -he is not a surgical candidate for valve replacement given underlying lung disease  -would recommend full pulmonary workup prior to consider further workup of valvular heart disease>>if prognosis is poor due to underlying lung disease would consider palliative care consult and not proceed with structural heart workup  7. Hypertension  -BP poorly controlled  -increase Lopressor to  50mg  BID  8. Hyperlipidemia  -continue statin    I have spent a total of 40 minutes with patient reviewing 2D echo, HRCT ,  telemetry, EKGs, labs and examining patient as well as establishing an assessment and plan that was discussed with the patient.  > 50% of time was spent in direct patient care.    For questions or updates, please contact [place] Please consult [email or web address] for contact info under        Signed, Fransico Him, MD  12/15/2019, 8:23 AM

## 2019-12-15 NOTE — Progress Notes (Signed)
PROGRESS NOTE    Larry Church  WCB:[id number] DOB: [birth year] DOA: 12/13/2019 PCP: Lilian Coma., MD    Brief Narrative:  86 year old gentleman with history of dementia, sleep apnea on CPAP, A. fib on Eliquis, coronary artery disease, chronic systolic heart failure with known ejection fraction 45%, moderate aortic stenosis, hyperlipidemia, history of subarachnoid hemorrhage but recently put back on Eliquis presented to PCP office with right leg swelling and weight gain and was sent to ER.  Recently diagnosed with UTI and on Keflex.  In the emergency room he was tachycardic.  Afebrile.  On room air.  BNP 250.  Troponin borderline elevated.  Chest x-ray with bilateral interstitial prominence.  He was admitted as CHF exacerbation.   Assessment & Plan:   Active Problems:   CAD (coronary artery disease)   BPH (benign prostatic hyperplasia)   Moderate aortic stenosis by prior echocardiography   DOE (dyspnea on exertion)   Acute on chronic combined systolic and diastolic CHF (congestive heart failure) (HCC)   E. coli UTI  Shortness of breath/dyspnea at rest and exertion.  Multifactorial. Aortic stenosis, unlikely candidate for TAVR. Deconditioning Obstructive sleep apnea Interstitial lung disease, will send outpatient referral to pulmonary. Presents with orthopnea, deconditioning and weight gain.  No clinical evidence of overt fluid overload. Known EF 45%.  Repeat echocardiogram shows EF 45% with severe aortic stenosis. At home on Lasix 20 mg three times a day.  Treated with IV Lasix.  No obvious fluid overload. Today with elevated creatinine, discontinue Lasix.  Will give diuretic holiday. Already on beta-blockers and nitrates. Seen by cardiology.  A. fib with RVR: Suboptimal rate control.  Increasing metoprolol dose today.  On Eliquis and therapeutic.  Right leg cellulitis: Duplex studies negative for DVT.  Already therapeutic on Eliquis.  No evidence of abscess.  We will  continue Keflex for 1 week.  E. coli UTI: On Keflex, will extend therapy 1 more week because of cellulitis.  Coronary artery disease: With chronic ischemic cardiomyopathy.  Currently denies any chest pain.  No evidence of acute coronary syndrome.  Patient on aspirin, statin, nitrates and beta-blockers.  Dementia: Fairly stable.  Somnolence: Not used CPAP last two nights.  Use CPAP as needed and nightly.  Acute kidney injury: Probably due to diuresis in the setting of underlying severe aortic stenosis.  Diuretic holiday today.  Recheck tomorrow morning.  Goal of care: Patient has multiple medical issues.  Sleep apnea, ischemic cardiomyopathy, severe aortic stenosis.  Pulmonary fibrosis that needs investigations. Recently in poor health. We discussed with patient's daughter Ms. Seth Bake at bedside, discussed different diagnosis, medical therapies and recently worsening ambulatory status at home. Believe patient will benefit with palliative care follow-up.  Will consult palliative care in the hospital. Educated about MOST form, DNR status and information given to the patient's daughter.  Patient's wife is also going to visit him today in the hospital.   DVT prophylaxis: Place TED hose Start: 12/15/19 0842 apixaban (ELIQUIS) tablet 5 mg   Code Status: Full code Family Communication: None daughter at bedside. Disposition Plan: Status is: Inpatient  Remains inpatient appropriate because:Inpatient level of care appropriate due to severity of illness   Dispo: The patient is from: Home              Anticipated d/c is to: Home              Anticipated d/c date is: 1 to 2 days.  Patient currently is not medically stable to d/c.         Consultants:   Cardiology  Procedures:   None  Antimicrobials:   Keflex, 10/24---   Subjective: Seen and examined.  Poor historian.  He himself denies any complaints.  Telemetry shows A. fib with heart rate 120-130. Patient  falling asleep in between conversation. Objective: Vitals:   12/14/19 1152 12/14/19 2125 12/15/19 0500 12/15/19 0637  BP: 114/79 (!) 144/111  (!) 153/110  Pulse: 97 66  (!) 103  Resp: (!) 21   (!) 21  Temp: 98.3 F (36.8 C) 98.2 F (36.8 C)  97.9 F (36.6 C)  TempSrc: Oral Oral  Oral  SpO2: 95% 90%  96%  Weight:   108 kg   Height:        Intake/Output Summary (Last 24 hours) at 12/15/2019 1112 Last data filed at 12/15/2019 0648 Gross per 24 hour  Intake 975 ml  Output 780 ml  Net 195 ml   Filed Weights   12/13/19 1134 12/13/19 2035 12/15/19 0500  Weight: 108.9 kg 108.9 kg 108 kg    Examination:  General exam: Appears calm and comfortable  Pleasantly confused gentleman not in distress.  Behavior is well controlled. Alert and oriented x2. Sleepy.  Falls asleep in between conversation. Respiratory system: Some basal crackles most on the posterior lung field.  Respiratory effort normal.  Mostly on room air. Cardiovascular system: S1 & S2 heard, irregularly irregular.  Tachycardic. Left leg with trace edema. Right shin with erythema and 2+ edema.  No underlying fluctuation. Gastrointestinal system: Abdomen is nondistended, soft and nontender. No organomegaly or masses felt. Normal bowel sounds heard. Central nervous system: Alert and oriented x2.  No focal deficits.   Data Reviewed: I have personally reviewed following labs and imaging studies  CBC: Recent Labs  Lab 12/13/19 1215  WBC 10.9*  HGB 12.9*  HCT 40.5  MCV 106.3*  PLT 892   Basic Metabolic Panel: Recent Labs  Lab 12/13/19 1215 12/13/19 1507 12/14/19 0340 12/15/19 0421  NA 136  --  137 138  K 4.1  --  3.9 3.5  CL 101  --  100 100  CO2 27  --  26 26  GLUCOSE 90  --  127* 123*  BUN 20  --  20 27*  CREATININE 1.00  --  0.97 1.34*  CALCIUM 9.2  --  9.6 9.4  MG  --  2.4  --   --    GFR: Estimated Creatinine Clearance: 47.3 mL/min (A) (by C-G formula based on SCr of 1.34 mg/dL (H)). Liver  Function Tests: Recent Labs  Lab 12/13/19 1215  AST 29  ALT 29  ALKPHOS 78  BILITOT 0.8  PROT 7.2  ALBUMIN 3.1*   No results for input(s): LIPASE, AMYLASE in the last 168 hours. No results for input(s): AMMONIA in the last 168 hours. Coagulation Profile: Recent Labs  Lab 12/13/19 1215  INR 1.3*   Cardiac Enzymes: No results for input(s): CKTOTAL, CKMB, CKMBINDEX, TROPONINI in the last 168 hours. BNP (last 3 results) No results for input(s): PROBNP in the last 8760 hours. HbA1C: No results for input(s): HGBA1C in the last 72 hours. CBG: Recent Labs  Lab 12/09/19 2029  GLUCAP 115*   Lipid Profile: No results for input(s): CHOL, HDL, LDLCALC, TRIG, CHOLHDL, LDLDIRECT in the last 72 hours. Thyroid Function Tests: Recent Labs    12/15/19 0421  TSH 2.077   Anemia Panel: No results for input(s):  VITAMINB12, FOLATE, FERRITIN, TIBC, IRON, RETICCTPCT in the last 72 hours. Sepsis Labs: No results for input(s): PROCALCITON, LATICACIDVEN in the last 168 hours.  Recent Results (from the past 240 hour(s))  Urine culture     Status: Abnormal   Collection Time: 12/09/19  8:10 PM   Specimen: Urine, Random  Result Value Ref Range Status   Specimen Description   Final    URINE, RANDOM Performed at [facility] [address], [place], [place] [address]    Special Requests   Final    NONE Performed at [facility], [place] [address], [place], [place] [address]    Culture >=100,000 COLONIES/mL ESCHERICHIA COLI (A)  Final   Report Status 12/12/2019 FINAL  Final   Organism ID, Bacteria ESCHERICHIA COLI (A)  Final      Susceptibility   Escherichia coli - MIC*    AMPICILLIN >=32 RESISTANT Resistant     CEFAZOLIN <=4 SENSITIVE Sensitive     CEFTRIAXONE <=0.25 SENSITIVE Sensitive     CIPROFLOXACIN <=0.25 SENSITIVE Sensitive     GENTAMICIN <=1 SENSITIVE Sensitive     IMIPENEM <=0.25 SENSITIVE Sensitive     NITROFURANTOIN <=16 SENSITIVE  Sensitive     TRIMETH/SULFA <=20 SENSITIVE Sensitive     AMPICILLIN/SULBACTAM 16 INTERMEDIATE Intermediate     PIP/TAZO <=4 SENSITIVE Sensitive     * >=100,000 COLONIES/mL ESCHERICHIA COLI  Respiratory Panel by RT PCR (Flu A&B, Covid) - Nasopharyngeal Swab     Status: None   Collection Time: 12/13/19  6:14 PM   Specimen: Nasopharyngeal Swab  Result Value Ref Range Status   SARS Coronavirus 2 by RT PCR NEGATIVE NEGATIVE Final    Comment: (NOTE) SARS-CoV-2 target nucleic acids are NOT DETECTED.  The SARS-CoV-2 RNA is generally detectable in upper respiratoy specimens during the acute phase of infection. The lowest concentration of SARS-CoV-2 viral copies this assay can detect is 131 copies/mL. A negative result does not preclude SARS-Cov-2 infection and should not be used as the sole basis for treatment or other patient management decisions. A negative result may occur with  improper specimen collection/handling, submission of specimen other than nasopharyngeal swab, presence of viral mutation(s) within the areas targeted by this assay, and inadequate number of viral copies (<131 copies/mL). A negative result must be combined with clinical observations, patient history, and epidemiological information. The expected result is Negative.  Fact Sheet for Patients:  PinkCheek.be  Fact Sheet for Healthcare Providers:  GravelBags.it  This test is no t yet approved or cleared by the [place] FDA and  has been authorized for detection and/or diagnosis of SARS-CoV-2 by FDA under an Emergency Use Authorization (EUA). This EUA will remain  in effect (meaning this test can be used) for the duration of the COVID-19 declaration under Section 564(b)(1) of the Act, 21 U.S.C. section 360bbb-3(b)(1), unless the authorization is terminated or revoked sooner.     Influenza A by PCR NEGATIVE NEGATIVE Final   Influenza B by PCR NEGATIVE  NEGATIVE Final    Comment: (NOTE) The Xpert Xpress SARS-CoV-2/FLU/RSV assay is intended as an aid in  the diagnosis of influenza from Nasopharyngeal swab specimens and  should not be used as a sole basis for treatment. Nasal washings and  aspirates are unacceptable for Xpert Xpress SARS-CoV-2/FLU/RSV  testing.  Fact Sheet for Patients: PinkCheek.be  Fact Sheet for Healthcare Providers: GravelBags.it  This test is not yet approved or cleared by the [place] and  has been authorized  for detection and/or diagnosis of SARS-CoV-2 by  FDA under an Emergency Use Authorization (EUA). This EUA will remain  in effect (meaning this test can be used) for the duration of the  Covid-19 declaration under Section 564(b)(1) of the Act, 21  U.S.C. section 360bbb-3(b)(1), unless the authorization is  terminated or revoked. Performed at [facility], [place] [address], [place], [place] [address]          Radiology Studies: CT Chest High Resolution  Result Date: 12/14/2019 CLINICAL DATA:  Chronic dyspnea. Interstitial lung disease suspected. EXAM: CT CHEST WITHOUT CONTRAST TECHNIQUE: Multidetector CT imaging of the chest was performed following the standard protocol without intravenous contrast. High resolution imaging of the lungs, as well as inspiratory and expiratory imaging, was performed. COMPARISON:  Chest radiograph from one day prior. 05/01/2014 chest CT angiogram. FINDINGS: Cardiovascular: Stable mild cardiomegaly. No significant pericardial effusion/thickening. Three-vessel coronary atherosclerosis. Atherosclerotic nonaneurysmal thoracic aorta. Normal caliber pulmonary arteries. Mediastinum/Nodes: No discrete thyroid nodules. Unremarkable esophagus. No pathologically enlarged axillary, mediastinal or hilar lymph nodes, noting limited sensitivity for the detection of hilar adenopathy on this noncontrast study.  Lungs/Pleura: No pneumothorax. No pleural effusion. No acute consolidative airspace disease, lung masses or significant pulmonary nodules. No significant air trapping or evidence of tracheobronchomalacia on the expiration sequence. There is extensive patchy confluent subpleural reticulation and ground-glass opacity throughout both lungs with associated moderate traction bronchiectasis, architectural distortion and volume loss. There is a basilar predominance to these findings. No frank honeycombing. Findings have clearly progressed in the interval since 05/01/2014 chest CT. Upper abdomen: Cholecystectomy. Musculoskeletal: No aggressive appearing focal osseous lesions. Partially visualized surgical hardware from ACDF. Partially visualized vertebroplasty change at the chronic L1 vertebral compression fracture. Mild T10 vertebral compression fracture appears chronic, although is new since 05/01/2014 chest CT. Marked thoracic spondylosis. IMPRESSION: 1. Spectrum of findings compatible with basilar predominant fibrotic interstitial lung disease without frank honeycombing, clearly progressed in the interval since 05/01/2014 chest CT. Findings are categorized as probable UIP per consensus guidelines: Diagnosis of Idiopathic Pulmonary Fibrosis: An Official ATS/ERS/JRS/ALAT Clinical Practice Guideline. [place], Iss 5, [phone], Oct 16 2016. 2. Stable mild cardiomegaly. Three-vessel coronary atherosclerosis. 3. Mild T10 vertebral compression fracture is new since 05/01/2014 chest CT and of uncertain chronicity, favor chronic. 4. Aortic Atherosclerosis (ICD10-I70.0). Electronically Signed   By: Ilona Sorrel M.D.   On: 12/14/2019 14:31   DG Chest Port 1 View  Result Date: 12/13/2019 CLINICAL DATA:  Ankle swelling. EXAM: PORTABLE CHEST 1 VIEW COMPARISON:  11/07/2019. FINDINGS: Cardiomegaly. Bilateral interstitial prominence again noted. Left pleural effusion again noted. Findings suggest CHF. An  active interstitial process such as pneumonitis cannot be excluded. Underlying chronic interstitial lung disease cannot be excluded. No pneumothorax. Prior cervical spine fusion. IMPRESSION: Cardiomegaly with bilateral interstitial prominence and left pleural effusion. Findings suggest CHF. An active interstitial process such as pneumonitis cannot be excluded. Underlying chronic interstitial lung disease cannot be excluded. Electronically Signed   By: Marcello Moores  Register   On: 12/13/2019 12:18   ECHOCARDIOGRAM COMPLETE  Result Date: 12/14/2019    ECHOCARDIOGRAM REPORT   Patient Name:   ALGIS LEHENBAUER Date of Exam: 12/14/2019 Medical Rec #:  [id number]      Height:       66.0 in Accession #:    [id number]     Weight:       240.1 lb Date of Birth:  1932-04-06     BSA:  2.161 m Patient Age:    14 years       BP:           114/79 mmHg Patient Gender: M              HR:           113 bpm. Exam Location:  Inpatient Procedure: 2D Echo, Cardiac Doppler and Color Doppler Indications:    CHF  History:        Patient has prior history of Echocardiogram examinations, most                 recent 08/29/2018. CHF, Aortic Valve Disease, Arrythmias:Atrial                 Fibrillation, Signs/Symptoms:Shortness of Breath and Altered                 Mental Status; Risk Factors:Dyslipidemia.  Sonographer:    Dustin Flock Referring Phys: [id number] [place]  1. There is severe aortic stenosis. V max 2.1 m/s, MG 10 mmHG, AVA 0.87 cm2, DI 0.25. This is low-flow low-gradient AS in the setting of LV dysfunction. SVI=15 cc/m2. Consider aortic valve calcium score for clarification. The aortic valve is tricuspid. Aortic valve regurgitation is trivial. Severe aortic valve stenosis.  2. Moderate to severe calcific mitral stenosis. MG 2.9 mmHG with MVA 1.19 cm2 by VTI. Gradient also lower than expected due to low flow state (SVI=15 cc/m2). The mitral valve is degenerative. Trivial mitral valve regurgitation. Moderate to  severe mitral  stenosis. Moderate to severe mitral annular calcification.  3. Left ventricular ejection fraction, by estimation, is 40 to 45%. The left ventricle has mildly decreased function. The left ventricle demonstrates global hypokinesis. There is mild concentric left ventricular hypertrophy. Left ventricular diastolic function could not be evaluated.  4. Right ventricular systolic function is mildly reduced. The right ventricular size is normal. There is normal pulmonary artery systolic pressure. The estimated right ventricular systolic pressure is 29.5 mmHg.  5. Left atrial size was mildly dilated.  6. The inferior vena cava is normal in size with greater than 50% respiratory variability, suggesting right atrial pressure of 3 mmHg. Comparison(s): Prior images unable to be directly viewed, comparison made by report only. Changes from prior study are noted. EF 40-45%. Low-flow low-gradient severe aortic stenosis is present. Moderate to severe calcific mitral stenosis is present. FINDINGS  Left Ventricle: Left ventricular ejection fraction, by estimation, is 40 to 45%. The left ventricle has mildly decreased function. The left ventricle demonstrates global hypokinesis. The left ventricular internal cavity size was normal in size. There is  mild concentric left ventricular hypertrophy. Left ventricular diastolic function could not be evaluated due to atrial fibrillation. Left ventricular diastolic function could not be evaluated. Right Ventricle: The right ventricular size is normal. No increase in right ventricular wall thickness. Right ventricular systolic function is mildly reduced. There is normal pulmonary artery systolic pressure. The tricuspid regurgitant velocity is 2.46 m/s, and with an assumed right atrial pressure of 3 mmHg, the estimated right ventricular systolic pressure is 28.4 mmHg. Left Atrium: Left atrial size was mildly dilated. Right Atrium: Right atrial size was normal in size. Pericardium:  Trivial pericardial effusion is present. Presence of pericardial fat pad. Mitral Valve: Moderate to severe calcific mitral stenosis. MG 2.9 mmHG with MVA 1.19 cm2 by VTI. Gradient also lower than expected due to low flow state (SVI=15 cc/m2). The mitral valve is degenerative in appearance. Moderate to severe  mitral annular calcification. Trivial mitral valve regurgitation. Moderate to severe mitral valve stenosis. The mean mitral valve gradient is 2.9 mmHg with average heart rate of 95 bpm. Tricuspid Valve: The tricuspid valve is grossly normal. Tricuspid valve regurgitation is trivial. No evidence of tricuspid stenosis. Aortic Valve: There is severe aortic stenosis. V max 2.1 m/s, MG 10 mmHG, AVA 0.87 cm2, DI 0.25. This is low-flow low-gradient AS in the setting of LV dysfunction. SVI=15 cc/m2. Consider aortic valve calcium score for clarification. The aortic valve is tricuspid. Aortic valve regurgitation is trivial. Severe aortic stenosis is present. Aortic valve mean gradient measures 10.0 mmHg. Aortic valve peak gradient measures 18.4 mmHg. Aortic valve area, by VTI measures 0.87 cm. Pulmonic Valve: The pulmonic valve was grossly normal. Pulmonic valve regurgitation is not visualized. No evidence of pulmonic stenosis. Aorta: The aortic root is normal in size and structure. Venous: The inferior vena cava is normal in size with greater than 50% respiratory variability, suggesting right atrial pressure of 3 mmHg. IAS/Shunts: The atrial septum is grossly normal.  LEFT VENTRICLE PLAX 2D LVIDd:         4.80 cm  Diastology LVIDs:         3.70 cm  LV e' medial:    7.29 cm/s LV PW:         1.40 cm  LV E/e' medial:  15.6 LV IVS:        1.40 cm  LV e' lateral:   7.51 cm/s LVOT diam:     2.10 cm  LV E/e' lateral: 15.2 LV SV:         33 LV SV Index:   15 LVOT Area:     3.46 cm  RIGHT VENTRICLE RV Basal diam:  2.90 cm RV S prime:     3.26 cm/s TAPSE (M-mode): 2.5 cm LEFT ATRIUM             Index       RIGHT ATRIUM            Index LA diam:        5.00 cm 2.31 cm/m  RA Area:     19.40 cm LA Vol (A2C):   83.4 ml 38.59 ml/m RA Volume:   55.20 ml  25.54 ml/m LA Vol (A4C):   88.4 ml 40.90 ml/m LA Biplane Vol: 87.3 ml 40.39 ml/m  AORTIC VALVE AV Area (Vmax):    0.94 cm AV Area (Vmean):   0.93 cm AV Area (VTI):     0.87 cm AV Vmax:           214.50 cm/s AV Vmean:          145.000 cm/s AV VTI:            0.385 m AV Peak Grad:      18.4 mmHg AV Mean Grad:      10.0 mmHg LVOT Vmax:         58.40 cm/s LVOT Vmean:        39.000 cm/s LVOT VTI:          0.097 m LVOT/AV VTI ratio: 0.25  AORTA Ao Root diam: 3.40 cm MITRAL VALVE                TRICUSPID VALVE MV Area (PHT): 4.68 cm     TR Peak grad:   24.2 mmHg MV Area VTI:   1.19 cm     TR Vmax:        246.00 cm/s MV  Mean grad:  2.9 mmHg MV VTI:        0.28 m       SHUNTS MV Decel Time: 162 msec     Systemic VTI:  0.10 m MV E velocity: 114.00 cm/s  Systemic Diam: 2.10 cm Eleonore Chiquito MD Electronically signed by Eleonore Chiquito MD Signature Date/Time: 12/14/2019/4:03:12 PM    Final    VAS [place] LOWER EXTREMITY VENOUS (DVT)  Result Date: 12/14/2019  Lower Venous DVT Study Indications: Swelling.  Risk Factors: None identified. Limitations: Body habitus, poor ultrasound/tissue interface and patient positioning. Comparison Study: No prior studies. Performing Technologist: Oliver Hum RVT  Examination Guidelines: A complete evaluation includes B-mode imaging, spectral Doppler, color Doppler, and power Doppler as needed of all accessible portions of each vessel. Bilateral testing is considered an integral part of a complete examination. Limited examinations for reoccurring indications may be performed as noted. The reflux portion of the exam is performed with the patient in reverse Trendelenburg.  +---------+---------------+---------+-----------+----------+--------------+ RIGHT    CompressibilityPhasicitySpontaneityPropertiesThrombus Aging  +---------+---------------+---------+-----------+----------+--------------+ CFV      Full           Yes      Yes                                 +---------+---------------+---------+-----------+----------+--------------+ SFJ      Full                                                        +---------+---------------+---------+-----------+----------+--------------+ FV Prox  Full                                                        +---------+---------------+---------+-----------+----------+--------------+ FV Mid   Full                                                        +---------+---------------+---------+-----------+----------+--------------+ FV DistalFull                                                        +---------+---------------+---------+-----------+----------+--------------+ PFV      Full                                                        +---------+---------------+---------+-----------+----------+--------------+ POP      Full           Yes      Yes                                 +---------+---------------+---------+-----------+----------+--------------+ PTV  Full                                                        +---------+---------------+---------+-----------+----------+--------------+ PERO     Full                                                        +---------+---------------+---------+-----------+----------+--------------+   +---------+---------------+---------+-----------+----------+--------------+ LEFT     CompressibilityPhasicitySpontaneityPropertiesThrombus Aging +---------+---------------+---------+-----------+----------+--------------+ CFV      Full           Yes      Yes                                 +---------+---------------+---------+-----------+----------+--------------+ SFJ      Full                                                         +---------+---------------+---------+-----------+----------+--------------+ FV Prox  Full                                                        +---------+---------------+---------+-----------+----------+--------------+ FV Mid   Full                                                        +---------+---------------+---------+-----------+----------+--------------+ FV DistalFull                                                        +---------+---------------+---------+-----------+----------+--------------+ PFV      Full                                                        +---------+---------------+---------+-----------+----------+--------------+ POP      Full           Yes      Yes                                 +---------+---------------+---------+-----------+----------+--------------+ PTV      Full                                                        +---------+---------------+---------+-----------+----------+--------------+  PERO                                                  Not visualized +---------+---------------+---------+-----------+----------+--------------+     Summary: RIGHT: - There is no evidence of deep vein thrombosis in the lower extremity. However, portions of this examination were limited- see technologist comments above.  - No cystic structure found in the popliteal fossa.  LEFT: - There is no evidence of deep vein thrombosis in the lower extremity. However, portions of this examination were limited- see technologist comments above.  - No cystic structure found in the popliteal fossa.  *See table(s) above for measurements and observations. Electronically signed by Deitra Mayo MD on 12/14/2019 at 1:02:56 PM.    Final         Scheduled Meds: . alfuzosin  10 mg Oral Q breakfast  . apixaban  5 mg Oral BID  . aspirin EC  81 mg Oral Daily  . atorvastatin  40 mg Oral Daily  . cephALEXin  500 mg Oral TID  . finasteride  5 mg Oral  Daily  . gabapentin  100 mg Oral BID  . isosorbide mononitrate  15 mg Oral Daily  . memantine  10 mg Oral BID  . metoprolol tartrate  50 mg Oral BID  . pantoprazole  40 mg Oral Daily  . sodium chloride flush  3 mL Intravenous Q12H  . venlafaxine XR  150 mg Oral Daily  . venlafaxine XR  75 mg Oral QHS   Continuous Infusions: . sodium chloride       LOS: 2 days    Time spent: 35 minutes    Barb Merino, MD [facility] Pager [phone]

## 2019-12-16 DIAGNOSIS — Z515 Encounter for palliative care: Secondary | ICD-10-CM

## 2019-12-16 DIAGNOSIS — I2510 Atherosclerotic heart disease of native coronary artery without angina pectoris: Secondary | ICD-10-CM | POA: Diagnosis not present

## 2019-12-16 DIAGNOSIS — I350 Nonrheumatic aortic (valve) stenosis: Secondary | ICD-10-CM | POA: Diagnosis not present

## 2019-12-16 DIAGNOSIS — R0600 Dyspnea, unspecified: Secondary | ICD-10-CM | POA: Diagnosis not present

## 2019-12-16 DIAGNOSIS — I5043 Acute on chronic combined systolic (congestive) and diastolic (congestive) heart failure: Secondary | ICD-10-CM | POA: Diagnosis not present

## 2019-12-16 DIAGNOSIS — R531 Weakness: Secondary | ICD-10-CM

## 2019-12-16 DIAGNOSIS — Z7189 Other specified counseling: Secondary | ICD-10-CM

## 2019-12-16 LAB — BASIC METABOLIC PANEL
Anion gap: 11 (ref 5–15)
BUN: 34 mg/dL — ABNORMAL HIGH (ref 8–23)
CO2: 27 mmol/L (ref 22–32)
Calcium: 9.3 mg/dL (ref 8.9–10.3)
Chloride: 101 mmol/L (ref 98–111)
Creatinine, Ser: 1.23 mg/dL (ref 0.61–1.24)
GFR, Estimated: 58 mL/min — ABNORMAL LOW (ref >60)
Glucose, Bld: 121 mg/dL — ABNORMAL HIGH (ref 70–99)
Potassium: 3.7 mmol/L (ref 3.5–5.1)
Sodium: 139 mmol/L (ref 135–145)

## 2019-12-16 MED ORDER — FUROSEMIDE 20 MG PO TABS: 20.0000 mg | ORAL_TABLET | Freq: Every day | ORAL | 2 refills | Status: AC

## 2019-12-16 MED ORDER — METOPROLOL TARTRATE 50 MG PO TABS: 50.0000 mg | ORAL_TABLET | Freq: Two times a day (BID) | ORAL | 0 refills | Status: AC

## 2019-12-16 NOTE — Discharge Summary (Signed)
Physician Discharge Summary  Larry Church TFT:[id number] DOB: 1932/09/09 DOA: 12/13/2019  PCP: Lilian Coma., MD  Admit date: 12/13/2019 Discharge date: 12/16/2019  Admitted From: Home Disposition: Home with home health  Recommendations for Outpatient Follow-up:  1. Follow up with PCP in 1-2 weeks 2. Please obtain BMP/CBC in one week 3.   Home Health: PT Equipment/Devices: None  Discharge Condition: Fair CODE STATUS: DNR Diet recommendation: Low-salt diet  Discharge summary:  86 year old gentleman with history of dementia, sleep apnea on CPAP, A. fib on Eliquis, coronary artery disease, chronic systolic heart failure with known ejection fraction 45%, moderate aortic stenosis, hyperlipidemia, history of subarachnoid hemorrhage but recently put back on Eliquis presented to PCP office with right leg swelling and weight gain and was sent to ER.  Recently diagnosed with UTI and on Keflex.  In the emergency room he was tachycardic.  Afebrile.  On room air.  BNP 250.  Troponin borderline elevated.  Chest x-ray with bilateral interstitial prominence.  He was admitted as CHF exacerbation, however his shortness of breath proved to be multifactorial and was diagnosed with interstitial lung disease and severe aortic stenosis.  Dyspnea at rest and exertion/progressive shortness of breath.  Multifactorial. Patient was found to have severe aortic stenosis, he is unlikely a candidate for TAVR given multiple issues.  He is also found to be deconditioned.  Has sleep apnea which is treated. A CT scan of the chest was consistent with interstitial lung disease most prominent at the bases. Patient is currently on room air and able to ambulate without need of supplemental oxygen. Ejection fraction is 45%.  Plan: Aortic stenosis, not a candidate for intervention given poor functional status and probably progressive interstitial lung disease.  EF 45%.  As per cardiology recommendation discharged home on a  small dose of diuretics.  Lasix 20 mg daily along with his beta-blocker and nitrates. Sleep apnea, he is using CPAP and consistent with it. Interstitial lung disease, probably progressive interstitial lung disease.  Previously not diagnosed.  Given overall poor clinical status, less likely a candidate for intervention.  We will send referral to pulmonary for follow-up. His A. fib was suboptimally rate controlled.  Metoprolol dose was increased.  He is already on Eliquis. Had swelling on the right leg, negative for DVT.  Probably cellulitis and treated with Keflex.  He was recently treated with Keflex for UTI, will extend the course for total 10 days.  Advance care planning: Discussed about progressive debility, dyspnea and multiple medical problems. DNR, discussed and documented. MOST form was filled, DNR and limited intervention discussed and documented and signed. Going home with home health physical therapy.  Has adequate support at home. Community-based palliative referral done.     Discharge Diagnoses:  Active Problems:   CAD (coronary artery disease)   BPH (benign prostatic hyperplasia)   Aortic stenosis, severe   DOE (dyspnea on exertion)   Acute on chronic combined systolic and diastolic CHF (congestive heart failure) (HCC)   E. coli UTI    Discharge Instructions  Discharge Instructions    Amb Referral to Palliative Care   Complete by: As directed    Ambulatory referral to Pulmonology   Complete by: As directed    Reason for referral: Interstitial Lung Disease(ILD)   Diet - low sodium heart healthy   Complete by: As directed    Increase activity slowly   Complete by: As directed      Allergies as of 12/16/2019  Reactions   Penicillins Anaphylaxis, Swelling   Has patient had a PCN reaction causing immediate rash, facial/tongue/throat swelling, SOB or lightheadedness with hypotension: Yes Has patient had a PCN reaction causing severe rash involving mucus  membranes or skin necrosis: No Has patient had a PCN reaction that required hospitalization: No Has patient had a PCN reaction occurring within the last 10 years: No If all of the above answers are "NO", then may proceed with Cephalosporin use.   Penicillins Anaphylaxis   Bee Venom Swelling   Donepezil Other (See Comments)   Vertigo   Methocarbamol    Tiredness      Medication List    STOP taking these medications   traMADol 50 MG tablet Commonly known as: ULTRAM     TAKE these medications   acetaminophen 500 MG tablet Commonly known as: TYLENOL Take 500-1,000 mg by mouth every 6 (six) hours as needed for moderate pain or headache.   albuterol 108 (90 Base) MCG/ACT inhaler Commonly known as: VENTOLIN HFA Inhale 2 puffs into the lungs every 6 (six) hours as needed for wheezing or shortness of breath.   alfuzosin 10 MG 24 hr tablet Commonly known as: UROXATRAL Take 10 mg by mouth daily with breakfast.   aspirin EC 81 MG tablet Take 81 mg by mouth daily. Swallow whole. What changed: Another medication with the same name was removed. Continue taking this medication, and follow the directions you see here.   atorvastatin 40 MG tablet Commonly known as: LIPITOR Take 40 mg by mouth daily.   azelastine 0.1 % nasal spray Commonly known as: ASTELIN Place 1 spray into both nostrils daily. Use in each nostril as directed   cephALEXin 500 MG capsule Commonly known as: KEFLEX Take 1 capsule (500 mg total) by mouth 3 (three) times daily for 7 days. What changed: additional instructions   Eliquis 5 MG Tabs tablet Generic drug: apixaban Take 5 mg by mouth 2 (two) times daily.   EPINEPHrine 0.3 mg/0.3 mL Soaj injection Commonly known as: EpiPen 2-Pak Inject 0.3 mLs (0.3 mg total) into the muscle once. What changed:   when to take this  reasons to take this   finasteride 5 MG tablet Commonly known as: PROSCAR Take 5 mg by mouth daily.   fluticasone 50 MCG/ACT nasal  spray Commonly known as: FLONASE 2 sprays per nostril once a day if needed for stuffy nose use daily until end of june What changed:   how much to take  how to take this  when to take this  reasons to take this  additional instructions   furosemide 20 MG tablet Commonly known as: LASIX Take 1 tablet (20 mg total) by mouth daily. 3 times weekly as needed What changed: when to take this   gabapentin 100 MG capsule Commonly known as: NEURONTIN Take 100 mg by mouth 2 (two) times daily.   ipratropium 0.06 % nasal spray Commonly known as: ATROVENT Place 2 sprays into both nostrils 2 (two) times a day.   isosorbide mononitrate 30 MG 24 hr tablet Commonly known as: IMDUR Take 1 tablet (30 mg total) by mouth daily.   levocetirizine 5 MG tablet Commonly known as: XYZAL Take 5 mg by mouth every evening.   memantine 10 MG tablet Commonly known as: NAMENDA Take 10 mg by mouth 2 (two) times daily.   metoprolol tartrate 50 MG tablet Commonly known as: LOPRESSOR Take 1 tablet (50 mg total) by mouth 2 (two) times daily. What changed:  medication strength  how much to take   Mucinex DM Maximum Strength 60-1200 MG Tb12 Take 1 tablet by mouth daily.   nitroGLYCERIN 0.4 MG SL tablet Commonly known as: NITROSTAT Place 0.4 mg under the tongue every 5 (five) minutes as needed for chest pain.   omeprazole 40 MG capsule Commonly known as: PRILOSEC Take 40 mg by mouth in the morning and at bedtime.   Systane 0.4-0.3 % Soln Generic drug: Polyethyl Glycol-Propyl Glycol Place 1 drop into both eyes 3 (three) times daily as needed (dry/irritated eyes.).   triamcinolone cream 0.1 % Commonly known as: KENALOG Apply 1 application topically 2 (two) times daily.   venlafaxine XR 75 MG 24 hr capsule Commonly known as: EFFEXOR-XR Take 75-150 mg by mouth See admin instructions. 150 mg in the morning, 75 mg at bedtime       Follow-up Information    [facility] Follow up.    Specialty: Home Health Services Why: agency will provide home health physical therapy. Contact information: [place] [id number] [phone]              Allergies  Allergen Reactions  . Penicillins Anaphylaxis and Swelling    Has patient had a PCN reaction causing immediate rash, facial/tongue/throat swelling, SOB or lightheadedness with hypotension: Yes Has patient had a PCN reaction causing severe rash involving mucus membranes or skin necrosis: No Has patient had a PCN reaction that required hospitalization: No Has patient had a PCN reaction occurring within the last 10 years: No If all of the above answers are "NO", then may proceed with Cephalosporin use.  Marland Kitchen Penicillins Anaphylaxis  . Bee Venom Swelling  . Donepezil Other (See Comments)    Vertigo  . Methocarbamol     Tiredness    Consultations:  Cardiology  Palliative medicine   Procedures/Studies: CT Chest High Resolution  Result Date: 12/14/2019 CLINICAL DATA:  Chronic dyspnea. Interstitial lung disease suspected. EXAM: CT CHEST WITHOUT CONTRAST TECHNIQUE: Multidetector CT imaging of the chest was performed following the standard protocol without intravenous contrast. High resolution imaging of the lungs, as well as inspiratory and expiratory imaging, was performed. COMPARISON:  Chest radiograph from one day prior. 05/01/2014 chest CT angiogram. FINDINGS: Cardiovascular: Stable mild cardiomegaly. No significant pericardial effusion/thickening. Three-vessel coronary atherosclerosis. Atherosclerotic nonaneurysmal thoracic aorta. Normal caliber pulmonary arteries. Mediastinum/Nodes: No discrete thyroid nodules. Unremarkable esophagus. No pathologically enlarged axillary, mediastinal or hilar lymph nodes, noting limited sensitivity for the detection of hilar adenopathy on this noncontrast study. Lungs/Pleura: No pneumothorax. No pleural effusion. No acute consolidative airspace disease, lung masses  or significant pulmonary nodules. No significant air trapping or evidence of tracheobronchomalacia on the expiration sequence. There is extensive patchy confluent subpleural reticulation and ground-glass opacity throughout both lungs with associated moderate traction bronchiectasis, architectural distortion and volume loss. There is a basilar predominance to these findings. No frank honeycombing. Findings have clearly progressed in the interval since 05/01/2014 chest CT. Upper abdomen: Cholecystectomy. Musculoskeletal: No aggressive appearing focal osseous lesions. Partially visualized surgical hardware from ACDF. Partially visualized vertebroplasty change at the chronic L1 vertebral compression fracture. Mild T10 vertebral compression fracture appears chronic, although is new since 05/01/2014 chest CT. Marked thoracic spondylosis. IMPRESSION: 1. Spectrum of findings compatible with basilar predominant fibrotic interstitial lung disease without frank honeycombing, clearly progressed in the interval since 05/01/2014 chest CT. Findings are categorized as probable UIP per consensus guidelines: Diagnosis of Idiopathic Pulmonary Fibrosis: An Official ATS/ERS/JRS/ALAT Clinical Practice Guideline. Am J Respir Crit  Care Med Vol 198, Iss 5, [id number], Oct 16 2016. 2. Stable mild cardiomegaly. Three-vessel coronary atherosclerosis. 3. Mild T10 vertebral compression fracture is new since 05/01/2014 chest CT and of uncertain chronicity, favor chronic. 4. Aortic Atherosclerosis (ICD10-I70.0). Electronically Signed   By: Delbert Phenix M.D.   On: 12/14/2019 14:31   DG Chest Port 1 View  Result Date: 12/13/2019 CLINICAL DATA:  Ankle swelling. EXAM: PORTABLE CHEST 1 VIEW COMPARISON:  11/07/2019. FINDINGS: Cardiomegaly. Bilateral interstitial prominence again noted. Left pleural effusion again noted. Findings suggest CHF. An active interstitial process such as pneumonitis cannot be excluded. Underlying chronic interstitial lung  disease cannot be excluded. No pneumothorax. Prior cervical spine fusion. IMPRESSION: Cardiomegaly with bilateral interstitial prominence and left pleural effusion. Findings suggest CHF. An active interstitial process such as pneumonitis cannot be excluded. Underlying chronic interstitial lung disease cannot be excluded. Electronically Signed   By: Maisie Fus  Register   On: 12/13/2019 12:18   ECHOCARDIOGRAM COMPLETE  Result Date: 12/14/2019    ECHOCARDIOGRAM REPORT   Patient Name:   SHIRO ELLERMAN Date of Exam: 12/14/2019 Medical Rec #:  [id number]      Height:       66.0 in Accession #:    [id number]     Weight:       240.1 lb Date of Birth:  August 12, 1932     BSA:          2.161 m Patient Age:    86 years       BP:           114/79 mmHg Patient Gender: M              HR:           113 bpm. Exam Location:  Inpatient Procedure: 2D Echo, Cardiac Doppler and Color Doppler Indications:    CHF  History:        Patient has prior history of Echocardiogram examinations, most                 recent 08/29/2018. CHF, Aortic Valve Disease, Arrythmias:Atrial                 Fibrillation, Signs/Symptoms:Shortness of Breath and Altered                 Mental Status; Risk Factors:Dyslipidemia.  Sonographer:    Lavenia Atlas Referring Phys: [id number] JARED E SEGAL IMPRESSIONS  1. There is severe aortic stenosis. V max 2.1 m/s, MG 10 mmHG, AVA 0.87 cm2, DI 0.25. This is low-flow low-gradient AS in the setting of LV dysfunction. SVI=15 cc/m2. Consider aortic valve calcium score for clarification. The aortic valve is tricuspid. Aortic valve regurgitation is trivial. Severe aortic valve stenosis.  2. Moderate to severe calcific mitral stenosis. MG 2.9 mmHG with MVA 1.19 cm2 by VTI. Gradient also lower than expected due to low flow state (SVI=15 cc/m2). The mitral valve is degenerative. Trivial mitral valve regurgitation. Moderate to severe mitral  stenosis. Moderate to severe mitral annular calcification.  3. Left ventricular ejection  fraction, by estimation, is 40 to 45%. The left ventricle has mildly decreased function. The left ventricle demonstrates global hypokinesis. There is mild concentric left ventricular hypertrophy. Left ventricular diastolic function could not be evaluated.  4. Right ventricular systolic function is mildly reduced. The right ventricular size is normal. There is normal pulmonary artery systolic pressure. The estimated right ventricular systolic pressure is 27.2 mmHg.  5. Left atrial size was mildly dilated.  6. The  inferior vena cava is normal in size with greater than 50% respiratory variability, suggesting right atrial pressure of 3 mmHg. Comparison(s): Prior images unable to be directly viewed, comparison made by report only. Changes from prior study are noted. EF 40-45%. Low-flow low-gradient severe aortic stenosis is present. Moderate to severe calcific mitral stenosis is present. FINDINGS  Left Ventricle: Left ventricular ejection fraction, by estimation, is 40 to 45%. The left ventricle has mildly decreased function. The left ventricle demonstrates global hypokinesis. The left ventricular internal cavity size was normal in size. There is  mild concentric left ventricular hypertrophy. Left ventricular diastolic function could not be evaluated due to atrial fibrillation. Left ventricular diastolic function could not be evaluated. Right Ventricle: The right ventricular size is normal. No increase in right ventricular wall thickness. Right ventricular systolic function is mildly reduced. There is normal pulmonary artery systolic pressure. The tricuspid regurgitant velocity is 2.46 m/s, and with an assumed right atrial pressure of 3 mmHg, the estimated right ventricular systolic pressure is 80.9 mmHg. Left Atrium: Left atrial size was mildly dilated. Right Atrium: Right atrial size was normal in size. Pericardium: Trivial pericardial effusion is present. Presence of pericardial fat pad. Mitral Valve: Moderate to  severe calcific mitral stenosis. MG 2.9 mmHG with MVA 1.19 cm2 by VTI. Gradient also lower than expected due to low flow state (SVI=15 cc/m2). The mitral valve is degenerative in appearance. Moderate to severe mitral annular calcification. Trivial mitral valve regurgitation. Moderate to severe mitral valve stenosis. The mean mitral valve gradient is 2.9 mmHg with average heart rate of 95 bpm. Tricuspid Valve: The tricuspid valve is grossly normal. Tricuspid valve regurgitation is trivial. No evidence of tricuspid stenosis. Aortic Valve: There is severe aortic stenosis. V max 2.1 m/s, MG 10 mmHG, AVA 0.87 cm2, DI 0.25. This is low-flow low-gradient AS in the setting of LV dysfunction. SVI=15 cc/m2. Consider aortic valve calcium score for clarification. The aortic valve is tricuspid. Aortic valve regurgitation is trivial. Severe aortic stenosis is present. Aortic valve mean gradient measures 10.0 mmHg. Aortic valve peak gradient measures 18.4 mmHg. Aortic valve area, by VTI measures 0.87 cm. Pulmonic Valve: The pulmonic valve was grossly normal. Pulmonic valve regurgitation is not visualized. No evidence of pulmonic stenosis. Aorta: The aortic root is normal in size and structure. Venous: The inferior vena cava is normal in size with greater than 50% respiratory variability, suggesting right atrial pressure of 3 mmHg. IAS/Shunts: The atrial septum is grossly normal.  LEFT VENTRICLE PLAX 2D LVIDd:         4.80 cm  Diastology LVIDs:         3.70 cm  LV e' medial:    7.29 cm/s LV PW:         1.40 cm  LV E/e' medial:  15.6 LV IVS:        1.40 cm  LV e' lateral:   7.51 cm/s LVOT diam:     2.10 cm  LV E/e' lateral: 15.2 LV SV:         33 LV SV Index:   15 LVOT Area:     3.46 cm  RIGHT VENTRICLE RV Basal diam:  2.90 cm RV S prime:     3.26 cm/s TAPSE (M-mode): 2.5 cm LEFT ATRIUM             Index       RIGHT ATRIUM           Index LA diam:  5.00 cm 2.31 cm/m  RA Area:     19.40 cm LA Vol (A2C):   83.4 ml 38.59  ml/m RA Volume:   55.20 ml  25.54 ml/m LA Vol (A4C):   88.4 ml 40.90 ml/m LA Biplane Vol: 87.3 ml 40.39 ml/m  AORTIC VALVE AV Area (Vmax):    0.94 cm AV Area (Vmean):   0.93 cm AV Area (VTI):     0.87 cm AV Vmax:           214.50 cm/s AV Vmean:          145.000 cm/s AV VTI:            0.385 m AV Peak Grad:      18.4 mmHg AV Mean Grad:      10.0 mmHg LVOT Vmax:         58.40 cm/s LVOT Vmean:        39.000 cm/s LVOT VTI:          0.097 m LVOT/AV VTI ratio: 0.25  AORTA Ao Root diam: 3.40 cm MITRAL VALVE                TRICUSPID VALVE MV Area (PHT): 4.68 cm     TR Peak grad:   24.2 mmHg MV Area VTI:   1.19 cm     TR Vmax:        246.00 cm/s MV Mean grad:  2.9 mmHg MV VTI:        0.28 m       SHUNTS MV Decel Time: 162 msec     Systemic VTI:  0.10 m MV E velocity: 114.00 cm/s  Systemic Diam: 2.10 cm Eleonore Chiquito MD Electronically signed by Eleonore Chiquito MD Signature Date/Time: 12/14/2019/4:03:12 PM    Final    VAS [place] LOWER EXTREMITY VENOUS (DVT)  Result Date: 12/14/2019  Lower Venous DVT Study Indications: Swelling.  Risk Factors: None identified. Limitations: Body habitus, poor ultrasound/tissue interface and patient positioning. Comparison Study: No prior studies. Performing Technologist: Oliver Hum RVT  Examination Guidelines: A complete evaluation includes B-mode imaging, spectral Doppler, color Doppler, and power Doppler as needed of all accessible portions of each vessel. Bilateral testing is considered an integral part of a complete examination. Limited examinations for reoccurring indications may be performed as noted. The reflux portion of the exam is performed with the patient in reverse Trendelenburg.  +---------+---------------+---------+-----------+----------+--------------+ RIGHT    CompressibilityPhasicitySpontaneityPropertiesThrombus Aging +---------+---------------+---------+-----------+----------+--------------+ CFV      Full           Yes      Yes                                  +---------+---------------+---------+-----------+----------+--------------+ SFJ      Full                                                        +---------+---------------+---------+-----------+----------+--------------+ FV Prox  Full                                                        +---------+---------------+---------+-----------+----------+--------------+  FV Mid   Full                                                        +---------+---------------+---------+-----------+----------+--------------+ FV DistalFull                                                        +---------+---------------+---------+-----------+----------+--------------+ PFV      Full                                                        +---------+---------------+---------+-----------+----------+--------------+ POP      Full           Yes      Yes                                 +---------+---------------+---------+-----------+----------+--------------+ PTV      Full                                                        +---------+---------------+---------+-----------+----------+--------------+ PERO     Full                                                        +---------+---------------+---------+-----------+----------+--------------+   +---------+---------------+---------+-----------+----------+--------------+ LEFT     CompressibilityPhasicitySpontaneityPropertiesThrombus Aging +---------+---------------+---------+-----------+----------+--------------+ CFV      Full           Yes      Yes                                 +---------+---------------+---------+-----------+----------+--------------+ SFJ      Full                                                        +---------+---------------+---------+-----------+----------+--------------+ FV Prox  Full                                                         +---------+---------------+---------+-----------+----------+--------------+ FV Mid   Full                                                        +---------+---------------+---------+-----------+----------+--------------+  FV DistalFull                                                        +---------+---------------+---------+-----------+----------+--------------+ PFV      Full                                                        +---------+---------------+---------+-----------+----------+--------------+ POP      Full           Yes      Yes                                 +---------+---------------+---------+-----------+----------+--------------+ PTV      Full                                                        +---------+---------------+---------+-----------+----------+--------------+ PERO                                                  Not visualized +---------+---------------+---------+-----------+----------+--------------+     Summary: RIGHT: - There is no evidence of deep vein thrombosis in the lower extremity. However, portions of this examination were limited- see technologist comments above.  - No cystic structure found in the popliteal fossa.  LEFT: - There is no evidence of deep vein thrombosis in the lower extremity. However, portions of this examination were limited- see technologist comments above.  - No cystic structure found in the popliteal fossa.  *See table(s) above for measurements and observations. Electronically signed by Deitra Mayo MD on 12/14/2019 at 1:02:56 PM.    Final    (Echo, Carotid, EGD, Colonoscopy, ERCP)    Subjective: Patient was seen and examined.  No overnight events.  He woke up and improved mentation after using CPAP all day and last night.  Patient himself denies any complaints. Met with patient's family, daughter at the bedside and discussed discharge planning. Patient is looking forward to go home.   Discharge  Exam: Vitals:   12/15/19 2151 12/16/19 0617  BP: 116/73 111/70  Pulse: 89 90  Resp: 20   Temp: 98 F (36.7 C) 97.6 F (36.4 C)  SpO2: 93% 93%   Vitals:   12/15/19 1118 12/15/19 1323 12/15/19 2151 12/16/19 0617  BP:  (!) 101/59 116/73 111/70  Pulse:  85 89 90  Resp:  (!) 22 20   Temp:  (!) 97.5 F (36.4 C) 98 F (36.7 C) 97.6 F (36.4 C)  TempSrc:  Oral Oral   SpO2: 95% 95% 93% 93%  Weight:    106.8 kg  Height:        General: Pt is alert, awake, not in acute distress Today he is alert awake and oriented x2-3. Cardiovascular: Irregularly irregular.  S1/S2 +, no rubs, no gallops Respiratory: CTA bilaterally, no  wheezing, no rhonchi Abdominal: Soft, NT, ND, bowel sounds +, obese and pendulous. Extremities: Right leg with 1+ edema on compression stockings.  Left leg normal.  Slight erythema of the right leg and fading.    The results of significant diagnostics from this hospitalization (including imaging, microbiology, ancillary and laboratory) are listed below for reference.     Microbiology: Recent Results (from the past 240 hour(s))  Urine culture     Status: Abnormal   Collection Time: 12/09/19  8:10 PM   Specimen: Urine, Random  Result Value Ref Range Status   Specimen Description   Final    URINE, RANDOM Performed at [facility] [address], [place], [place] [address]    Special Requests   Final    NONE Performed at [facility], [place] [address] [place], [place] [address]    Culture >=100,000 COLONIES/mL ESCHERICHIA COLI (A)  Final   Report Status 12/12/2019 FINAL  Final   Organism ID, Bacteria ESCHERICHIA COLI (A)  Final      Susceptibility   Escherichia coli - MIC*    AMPICILLIN >=32 RESISTANT Resistant     CEFAZOLIN <=4 SENSITIVE Sensitive     CEFTRIAXONE <=0.25 SENSITIVE Sensitive     CIPROFLOXACIN <=0.25 SENSITIVE Sensitive     GENTAMICIN <=1 SENSITIVE Sensitive     IMIPENEM <=0.25 SENSITIVE Sensitive      NITROFURANTOIN <=16 SENSITIVE Sensitive     TRIMETH/SULFA <=20 SENSITIVE Sensitive     AMPICILLIN/SULBACTAM 16 INTERMEDIATE Intermediate     PIP/TAZO <=4 SENSITIVE Sensitive     * >=100,000 COLONIES/mL ESCHERICHIA COLI  Respiratory Panel by RT PCR (Flu A&B, Covid) - Nasopharyngeal Swab     Status: None   Collection Time: 12/13/19  6:14 PM   Specimen: Nasopharyngeal Swab  Result Value Ref Range Status   SARS Coronavirus 2 by RT PCR NEGATIVE NEGATIVE Final    Comment: (NOTE) SARS-CoV-2 target nucleic acids are NOT DETECTED.  The SARS-CoV-2 RNA is generally detectable in upper respiratoy specimens during the acute phase of infection. The lowest concentration of SARS-CoV-2 viral copies this assay can detect is 131 copies/mL. A negative result does not preclude SARS-Cov-2 infection and should not be used as the sole basis for treatment or other patient management decisions. A negative result may occur with  improper specimen collection/handling, submission of specimen other than nasopharyngeal swab, presence of viral mutation(s) within the areas targeted by this assay, and inadequate number of viral copies (<131 copies/mL). A negative result must be combined with clinical observations, patient history, and epidemiological information. The expected result is Negative.  Fact Sheet for Patients:  PinkCheek.be  Fact Sheet for Healthcare Providers:  GravelBags.it  This test is no t yet approved or cleared by the [place] FDA and  has been authorized for detection and/or diagnosis of SARS-CoV-2 by FDA under an Emergency Use Authorization (EUA). This EUA will remain  in effect (meaning this test can be used) for the duration of the COVID-19 declaration under Section 564(b)(1) of the Act, 21 U.S.C. section 360bbb-3(b)(1), unless the authorization is terminated or revoked sooner.     Influenza A by PCR NEGATIVE NEGATIVE Final    Influenza B by PCR NEGATIVE NEGATIVE Final    Comment: (NOTE) The Xpert Xpress SARS-CoV-2/FLU/RSV assay is intended as an aid in  the diagnosis of influenza from Nasopharyngeal swab specimens and  should not be used as a sole basis for treatment. Nasal washings and  aspirates are unacceptable for  Xpert Xpress SARS-CoV-2/FLU/RSV  testing.  Fact Sheet for Patients: PinkCheek.be  Fact Sheet for Healthcare Providers: GravelBags.it  This test is not yet approved or cleared by the [place] FDA and  has been authorized for detection and/or diagnosis of SARS-CoV-2 by  FDA under an Emergency Use Authorization (EUA). This EUA will remain  in effect (meaning this test can be used) for the duration of the  Covid-19 declaration under Section 564(b)(1) of the Act, 21  U.S.C. section 360bbb-3(b)(1), unless the authorization is  terminated or revoked. Performed at [facility], [place] [address], [place], [place] [address]      Labs: BNP (last 3 results) Recent Labs    12/13/19 1215  BNP 626.9*   Basic Metabolic Panel: Recent Labs  Lab 12/13/19 1215 12/13/19 1507 12/14/19 0340 12/15/19 0421 12/16/19 0648  NA 136  --  137 138 139  K 4.1  --  3.9 3.5 3.7  CL 101  --  100 100 101  CO2 27  --  $R'26 26 27  'oF$ GLUCOSE 90  --  127* 123* 121*  BUN 20  --  20 27* 34*  CREATININE 1.00  --  0.97 1.34* 1.23  CALCIUM 9.2  --  9.6 9.4 9.3  MG  --  2.4  --   --   --    Liver Function Tests: Recent Labs  Lab 12/13/19 1215  AST 29  ALT 29  ALKPHOS 78  BILITOT 0.8  PROT 7.2  ALBUMIN 3.1*   No results for input(s): LIPASE, AMYLASE in the last 168 hours. No results for input(s): AMMONIA in the last 168 hours. CBC: Recent Labs  Lab 12/13/19 1215  WBC 10.9*  HGB 12.9*  HCT 40.5  MCV 106.3*  PLT 278   Cardiac Enzymes: No results for input(s): CKTOTAL, CKMB, CKMBINDEX, TROPONINI in the last 168  hours. BNP: Invalid input(s): POCBNP CBG: Recent Labs  Lab 12/09/19 2029  GLUCAP 115*   D-Dimer No results for input(s): DDIMER in the last 72 hours. Hgb A1c No results for input(s): HGBA1C in the last 72 hours. Lipid Profile No results for input(s): CHOL, HDL, LDLCALC, TRIG, CHOLHDL, LDLDIRECT in the last 72 hours. Thyroid function studies Recent Labs    12/15/19 0421  TSH 2.077   Anemia work up No results for input(s): VITAMINB12, FOLATE, FERRITIN, TIBC, IRON, RETICCTPCT in the last 72 hours. Urinalysis    Component Value Date/Time   COLORURINE YELLOW 12/09/2019 2009   APPEARANCEUR CLOUDY (A) 12/09/2019 2009   LABSPEC 1.015 12/09/2019 2009   PHURINE 5.0 12/09/2019 2009   GLUCOSEU NEGATIVE 12/09/2019 2009   HGBUR LARGE (A) 12/09/2019 2009   BILIRUBINUR NEGATIVE 12/09/2019 2009   [place] 12/09/2019 2009   PROTEINUR 30 (A) 12/09/2019 2009   UROBILINOGEN 0.2 09/24/2014 0909   NITRITE POSITIVE (A) 12/09/2019 2009   LEUKOCYTESUR LARGE (A) 12/09/2019 2009   Sepsis Labs Invalid input(s): PROCALCITONIN,  WBC,  LACTICIDVEN Microbiology Recent Results (from the past 240 hour(s))  Urine culture     Status: Abnormal   Collection Time: 12/09/19  8:10 PM   Specimen: Urine, Random  Result Value Ref Range Status   Specimen Description   Final    URINE, RANDOM Performed at [facility], [place] [address], [place], [place] [address]    Special Requests   Final    NONE Performed at [facility], [place] [address], [place], [place] [address]    Culture >=100,000 COLONIES/mL ESCHERICHIA  COLI (A)  Final   Report Status 12/12/2019 FINAL  Final   Organism ID, Bacteria ESCHERICHIA COLI (A)  Final      Susceptibility   Escherichia coli - MIC*    AMPICILLIN >=32 RESISTANT Resistant     CEFAZOLIN <=4 SENSITIVE Sensitive     CEFTRIAXONE <=0.25 SENSITIVE Sensitive     CIPROFLOXACIN <=0.25 SENSITIVE Sensitive     GENTAMICIN <=1 SENSITIVE  Sensitive     IMIPENEM <=0.25 SENSITIVE Sensitive     NITROFURANTOIN <=16 SENSITIVE Sensitive     TRIMETH/SULFA <=20 SENSITIVE Sensitive     AMPICILLIN/SULBACTAM 16 INTERMEDIATE Intermediate     PIP/TAZO <=4 SENSITIVE Sensitive     * >=100,000 COLONIES/mL ESCHERICHIA COLI  Respiratory Panel by RT PCR (Flu A&B, Covid) - Nasopharyngeal Swab     Status: None   Collection Time: 12/13/19  6:14 PM   Specimen: Nasopharyngeal Swab  Result Value Ref Range Status   SARS Coronavirus 2 by RT PCR NEGATIVE NEGATIVE Final    Comment: (NOTE) SARS-CoV-2 target nucleic acids are NOT DETECTED.  The SARS-CoV-2 RNA is generally detectable in upper respiratoy specimens during the acute phase of infection. The lowest concentration of SARS-CoV-2 viral copies this assay can detect is 131 copies/mL. A negative result does not preclude SARS-Cov-2 infection and should not be used as the sole basis for treatment or other patient management decisions. A negative result may occur with  improper specimen collection/handling, submission of specimen other than nasopharyngeal swab, presence of viral mutation(s) within the areas targeted by this assay, and inadequate number of viral copies (<131 copies/mL). A negative result must be combined with clinical observations, patient history, and epidemiological information. The expected result is Negative.  Fact Sheet for Patients:  PinkCheek.be  Fact Sheet for Healthcare Providers:  GravelBags.it  This test is no t yet approved or cleared by the [place] FDA and  has been authorized for detection and/or diagnosis of SARS-CoV-2 by FDA under an Emergency Use Authorization (EUA). This EUA will remain  in effect (meaning this test can be used) for the duration of the COVID-19 declaration under Section 564(b)(1) of the Act, 21 U.S.C. section 360bbb-3(b)(1), unless the authorization is terminated or revoked  sooner.     Influenza A by PCR NEGATIVE NEGATIVE Final   Influenza B by PCR NEGATIVE NEGATIVE Final    Comment: (NOTE) The Xpert Xpress SARS-CoV-2/FLU/RSV assay is intended as an aid in  the diagnosis of influenza from Nasopharyngeal swab specimens and  should not be used as a sole basis for treatment. Nasal washings and  aspirates are unacceptable for Xpert Xpress SARS-CoV-2/FLU/RSV  testing.  Fact Sheet for Patients: PinkCheek.be  Fact Sheet for Healthcare Providers: GravelBags.it  This test is not yet approved or cleared by the [place] FDA and  has been authorized for detection and/or diagnosis of SARS-CoV-2 by  FDA under an Emergency Use Authorization (EUA). This EUA will remain  in effect (meaning this test can be used) for the duration of the  Covid-19 declaration under Section 564(b)(1) of the Act, 21  U.S.C. section 360bbb-3(b)(1), unless the authorization is  terminated or revoked. Performed at [facility], [place] [address], [place], [place] [address]      Time coordinating discharge: 45 minutes  SIGNED:   Barb Merino, MD  [facility] 12/16/2019, 12:18 PM

## 2019-12-16 NOTE — Progress Notes (Addendum)
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  Still SOB with cough  Inpatient Medications    Scheduled Meds: . alfuzosin  10 mg Oral Q breakfast  . apixaban  5 mg Oral BID  . aspirin EC  81 mg Oral Daily  . atorvastatin  40 mg Oral Daily  . cephALEXin  500 mg Oral TID  . finasteride  5 mg Oral Daily  . gabapentin  100 mg Oral BID  . isosorbide mononitrate  15 mg Oral Daily  . memantine  10 mg Oral BID  . metoprolol tartrate  50 mg Oral BID  . pantoprazole  40 mg Oral Daily  . sodium chloride flush  3 mL Intravenous Q12H  . venlafaxine XR  150 mg Oral Daily  . venlafaxine XR  75 mg Oral QHS   Continuous Infusions: . sodium chloride     PRN Meds: sodium chloride, acetaminophen, nitroGLYCERIN, ondansetron (ZOFRAN) IV, sodium chloride flush, traMADol   Vital Signs    Vitals:   12/15/19 1118 12/15/19 1323 12/15/19 2151 12/16/19 0617  BP:  (!) 101/59 116/73 111/70  Pulse:  85 89 90  Resp:  (!) 22 20   Temp:  (!) 97.5 F (36.4 C) 98 F (36.7 C) 97.6 F (36.4 C)  TempSrc:  Oral Oral   SpO2: 95% 95% 93% 93%  Weight:    106.8 kg  Height:        Intake/Output Summary (Last 24 hours) at 12/16/2019 6754 Last data filed at 12/16/2019 0300 Gross per 24 hour  Intake 700 ml  Output 400 ml  Net 300 ml   Last 3 Weights 12/16/2019 12/15/2019 12/13/2019  Weight (lbs) 235 lb 7.2 oz 238 lb 1.6 oz 240 lb 1.3 oz  Weight (kg) 106.8 kg 108 kg 108.9 kg      Telemetry    Atrial fibrillation with CVR - Personally Reviewed  ECG    No new EKG to review - Personally Reviewed  Physical Exam   GEN: Well nourished, well developed in no acute distress HEENT: Normal NECK: No JVD; No carotid bruits LYMPHATICS: No lymphadenopathy CARDIAC:irregularly irregular, no murmurs, rubs, gallops RESPIRATORY:  Clear to  auscultation without rales, wheezing or rhonchi  ABDOMEN: Soft, non-tender, non-distended MUSCULOSKELETAL:  trace LE edema; No deformity  SKIN: Warm and dry NEUROLOGIC:  Alert and oriented x 3 PSYCHIATRIC:  Normal affect    Labs    High Sensitivity Troponin:   Recent Labs  Lab 12/13/19 1215 12/13/19 1507  TROPONINIHS 20* 20*      Chemistry Recent Labs  Lab 12/13/19 1215 12/13/19 1215 12/14/19 0340 12/15/19 0421 12/16/19 0648  NA 136   < > 137 138 139  K 4.1   < > 3.9 3.5 3.7  CL 101   < > 100 100 101  CO2 27   < > 26 26 27   GLUCOSE 90   < > 127* 123* 121*  BUN 20   < > 20 27* 34*  CREATININE 1.00   < > 0.97 1.34* 1.23  CALCIUM 9.2   < > 9.6 9.4 9.3  PROT 7.2  --   --   --   --   ALBUMIN 3.1*  --   --   --   --   AST 29  --   --   --   --  ALT 29  --   --   --   --   ALKPHOS 78  --   --   --   --   BILITOT 0.8  --   --   --   --   GFRNONAA >60   < > >60 52* 58*  ANIONGAP 8   < > 11 12 11    < > = values in this interval not displayed.     Hematology Recent Labs  Lab 12/13/19 1215  WBC 10.9*  RBC 3.81*  HGB 12.9*  HCT 40.5  MCV 106.3*  MCH 33.9  MCHC 31.9  RDW 16.9*  PLT 278    BNP Recent Labs  Lab 12/13/19 1215  BNP 250.1*     DDimer No results for input(s): DDIMER in the last 168 hours.    CHA2DS2-VASc Score = 5  This indicates a 7.2% annual risk of stroke. The patient's score is based upon: CHF History: 1 HTN History: 1 Diabetes History: 0 Stroke History: 0 Vascular Disease History: 1 Age Score: 2 Gender Score: 0      Radiology    CT Chest High Resolution  Result Date: 12/14/2019 CLINICAL DATA:  Chronic dyspnea. Interstitial lung disease suspected. EXAM: CT CHEST WITHOUT CONTRAST TECHNIQUE: Multidetector CT imaging of the chest was performed following the standard protocol without intravenous contrast. High resolution imaging of the lungs, as well as inspiratory and expiratory imaging, was performed. COMPARISON:  Chest  radiograph from one day prior. 05/01/2014 chest CT angiogram. FINDINGS: Cardiovascular: Stable mild cardiomegaly. No significant pericardial effusion/thickening. Three-vessel coronary atherosclerosis. Atherosclerotic nonaneurysmal thoracic aorta. Normal caliber pulmonary arteries. Mediastinum/Nodes: No discrete thyroid nodules. Unremarkable esophagus. No pathologically enlarged axillary, mediastinal or hilar lymph nodes, noting limited sensitivity for the detection of hilar adenopathy on this noncontrast study. Lungs/Pleura: No pneumothorax. No pleural effusion. No acute consolidative airspace disease, lung masses or significant pulmonary nodules. No significant air trapping or evidence of tracheobronchomalacia on the expiration sequence. There is extensive patchy confluent subpleural reticulation and ground-glass opacity throughout both lungs with associated moderate traction bronchiectasis, architectural distortion and volume loss. There is a basilar predominance to these findings. No frank honeycombing. Findings have clearly progressed in the interval since 05/01/2014 chest CT. Upper abdomen: Cholecystectomy. Musculoskeletal: No aggressive appearing focal osseous lesions. Partially visualized surgical hardware from ACDF. Partially visualized vertebroplasty change at the chronic L1 vertebral compression fracture. Mild T10 vertebral compression fracture appears chronic, although is new since 05/01/2014 chest CT. Marked thoracic spondylosis. IMPRESSION: 1. Spectrum of findings compatible with basilar predominant fibrotic interstitial lung disease without frank honeycombing, clearly progressed in the interval since 05/01/2014 chest CT. Findings are categorized as probable UIP per consensus guidelines: Diagnosis of Idiopathic Pulmonary Fibrosis: An Official ATS/ERS/JRS/ALAT Clinical Practice Guideline. [place], Iss 5, [phone], Oct 16 2016. 2. Stable mild cardiomegaly. Three-vessel coronary  atherosclerosis. 3. Mild T10 vertebral compression fracture is new since 05/01/2014 chest CT and of uncertain chronicity, favor chronic. 4. Aortic Atherosclerosis (ICD10-I70.0). Electronically Signed   By: Ilona Sorrel M.D.   On: 12/14/2019 14:31   ECHOCARDIOGRAM COMPLETE  Result Date: 12/14/2019    ECHOCARDIOGRAM REPORT   Patient Name:   Larry Church Date of Exam: 12/14/2019 Medical Rec #:  [id number]      Height:       66.0 in Accession #:    [id number]     Weight:       240.1 lb Date of Birth:  29-Oct-1932  BSA:          2.161 m Patient Age:    39 years       BP:           114/79 mmHg Patient Gender: M              HR:           113 bpm. Exam Location:  Inpatient Procedure: 2D Echo, Cardiac Doppler and Color Doppler Indications:    CHF  History:        Patient has prior history of Echocardiogram examinations, most                 recent 08/29/2018. CHF, Aortic Valve Disease, Arrythmias:Atrial                 Fibrillation, Signs/Symptoms:Shortness of Breath and Altered                 Mental Status; Risk Factors:Dyslipidemia.  Sonographer:    Dustin Flock Referring Phys: [id number] [place]  1. There is severe aortic stenosis. V max 2.1 m/s, MG 10 mmHG, AVA 0.87 cm2, DI 0.25. This is low-flow low-gradient AS in the setting of LV dysfunction. SVI=15 cc/m2. Consider aortic valve calcium score for clarification. The aortic valve is tricuspid. Aortic valve regurgitation is trivial. Severe aortic valve stenosis.  2. Moderate to severe calcific mitral stenosis. MG 2.9 mmHG with MVA 1.19 cm2 by VTI. Gradient also lower than expected due to low flow state (SVI=15 cc/m2). The mitral valve is degenerative. Trivial mitral valve regurgitation. Moderate to severe mitral  stenosis. Moderate to severe mitral annular calcification.  3. Left ventricular ejection fraction, by estimation, is 40 to 45%. The left ventricle has mildly decreased function. The left ventricle demonstrates global hypokinesis.  There is mild concentric left ventricular hypertrophy. Left ventricular diastolic function could not be evaluated.  4. Right ventricular systolic function is mildly reduced. The right ventricular size is normal. There is normal pulmonary artery systolic pressure. The estimated right ventricular systolic pressure is 29.5 mmHg.  5. Left atrial size was mildly dilated.  6. The inferior vena cava is normal in size with greater than 50% respiratory variability, suggesting right atrial pressure of 3 mmHg. Comparison(s): Prior images unable to be directly viewed, comparison made by report only. Changes from prior study are noted. EF 40-45%. Low-flow low-gradient severe aortic stenosis is present. Moderate to severe calcific mitral stenosis is present. FINDINGS  Left Ventricle: Left ventricular ejection fraction, by estimation, is 40 to 45%. The left ventricle has mildly decreased function. The left ventricle demonstrates global hypokinesis. The left ventricular internal cavity size was normal in size. There is  mild concentric left ventricular hypertrophy. Left ventricular diastolic function could not be evaluated due to atrial fibrillation. Left ventricular diastolic function could not be evaluated. Right Ventricle: The right ventricular size is normal. No increase in right ventricular wall thickness. Right ventricular systolic function is mildly reduced. There is normal pulmonary artery systolic pressure. The tricuspid regurgitant velocity is 2.46 m/s, and with an assumed right atrial pressure of 3 mmHg, the estimated right ventricular systolic pressure is 28.4 mmHg. Left Atrium: Left atrial size was mildly dilated. Right Atrium: Right atrial size was normal in size. Pericardium: Trivial pericardial effusion is present. Presence of pericardial fat pad. Mitral Valve: Moderate to severe calcific mitral stenosis. MG 2.9 mmHG with MVA 1.19 cm2 by VTI. Gradient also lower than expected due to low flow state (SVI=15 cc/m2).  The mitral valve is degenerative in appearance. Moderate to severe mitral annular calcification. Trivial mitral valve regurgitation. Moderate to severe mitral valve stenosis. The mean mitral valve gradient is 2.9 mmHg with average heart rate of 95 bpm. Tricuspid Valve: The tricuspid valve is grossly normal. Tricuspid valve regurgitation is trivial. No evidence of tricuspid stenosis. Aortic Valve: There is severe aortic stenosis. V max 2.1 m/s, MG 10 mmHG, AVA 0.87 cm2, DI 0.25. This is low-flow low-gradient AS in the setting of LV dysfunction. SVI=15 cc/m2. Consider aortic valve calcium score for clarification. The aortic valve is tricuspid. Aortic valve regurgitation is trivial. Severe aortic stenosis is present. Aortic valve mean gradient measures 10.0 mmHg. Aortic valve peak gradient measures 18.4 mmHg. Aortic valve area, by VTI measures 0.87 cm. Pulmonic Valve: The pulmonic valve was grossly normal. Pulmonic valve regurgitation is not visualized. No evidence of pulmonic stenosis. Aorta: The aortic root is normal in size and structure. Venous: The inferior vena cava is normal in size with greater than 50% respiratory variability, suggesting right atrial pressure of 3 mmHg. IAS/Shunts: The atrial septum is grossly normal.  LEFT VENTRICLE PLAX 2D LVIDd:         4.80 cm  Diastology LVIDs:         3.70 cm  LV e' medial:    7.29 cm/s LV PW:         1.40 cm  LV E/e' medial:  15.6 LV IVS:        1.40 cm  LV e' lateral:   7.51 cm/s LVOT diam:     2.10 cm  LV E/e' lateral: 15.2 LV SV:         33 LV SV Index:   15 LVOT Area:     3.46 cm  RIGHT VENTRICLE RV Basal diam:  2.90 cm RV S prime:     3.26 cm/s TAPSE (M-mode): 2.5 cm LEFT ATRIUM             Index       RIGHT ATRIUM           Index LA diam:        5.00 cm 2.31 cm/m  RA Area:     19.40 cm LA Vol (A2C):   83.4 ml 38.59 ml/m RA Volume:   55.20 ml  25.54 ml/m LA Vol (A4C):   88.4 ml 40.90 ml/m LA Biplane Vol: 87.3 ml 40.39 ml/m  AORTIC VALVE AV Area (Vmax):     0.94 cm AV Area (Vmean):   0.93 cm AV Area (VTI):     0.87 cm AV Vmax:           214.50 cm/s AV Vmean:          145.000 cm/s AV VTI:            0.385 m AV Peak Grad:      18.4 mmHg AV Mean Grad:      10.0 mmHg LVOT Vmax:         58.40 cm/s LVOT Vmean:        39.000 cm/s LVOT VTI:          0.097 m LVOT/AV VTI ratio: 0.25  AORTA Ao Root diam: 3.40 cm MITRAL VALVE                TRICUSPID VALVE MV Area (PHT): 4.68 cm     TR Peak grad:   24.2 mmHg MV Area VTI:   1.19 cm     TR Vmax:  246.00 cm/s MV Mean grad:  2.9 mmHg MV VTI:        0.28 m       SHUNTS MV Decel Time: 162 msec     Systemic VTI:  0.10 m MV E velocity: 114.00 cm/s  Systemic Diam: 2.10 cm Eleonore Chiquito MD Electronically signed by Eleonore Chiquito MD Signature Date/Time: 12/14/2019/4:03:12 PM    Final    VAS [place] LOWER EXTREMITY VENOUS (DVT)  Result Date: 12/14/2019  Lower Venous DVT Study Indications: Swelling.  Risk Factors: None identified. Limitations: Body habitus, poor ultrasound/tissue interface and patient positioning. Comparison Study: No prior studies. Performing Technologist: Oliver Hum RVT  Examination Guidelines: A complete evaluation includes B-mode imaging, spectral Doppler, color Doppler, and power Doppler as needed of all accessible portions of each vessel. Bilateral testing is considered an integral part of a complete examination. Limited examinations for reoccurring indications may be performed as noted. The reflux portion of the exam is performed with the patient in reverse Trendelenburg.  +---------+---------------+---------+-----------+----------+--------------+ RIGHT    CompressibilityPhasicitySpontaneityPropertiesThrombus Aging +---------+---------------+---------+-----------+----------+--------------+ CFV      Full           Yes      Yes                                 +---------+---------------+---------+-----------+----------+--------------+ SFJ      Full                                                         +---------+---------------+---------+-----------+----------+--------------+ FV Prox  Full                                                        +---------+---------------+---------+-----------+----------+--------------+ FV Mid   Full                                                        +---------+---------------+---------+-----------+----------+--------------+ FV DistalFull                                                        +---------+---------------+---------+-----------+----------+--------------+ PFV      Full                                                        +---------+---------------+---------+-----------+----------+--------------+ POP      Full           Yes      Yes                                 +---------+---------------+---------+-----------+----------+--------------+  PTV      Full                                                        +---------+---------------+---------+-----------+----------+--------------+ PERO     Full                                                        +---------+---------------+---------+-----------+----------+--------------+   +---------+---------------+---------+-----------+----------+--------------+ LEFT     CompressibilityPhasicitySpontaneityPropertiesThrombus Aging +---------+---------------+---------+-----------+----------+--------------+ CFV      Full           Yes      Yes                                 +---------+---------------+---------+-----------+----------+--------------+ SFJ      Full                                                        +---------+---------------+---------+-----------+----------+--------------+ FV Prox  Full                                                        +---------+---------------+---------+-----------+----------+--------------+ FV Mid   Full                                                         +---------+---------------+---------+-----------+----------+--------------+ FV DistalFull                                                        +---------+---------------+---------+-----------+----------+--------------+ PFV      Full                                                        +---------+---------------+---------+-----------+----------+--------------+ POP      Full           Yes      Yes                                 +---------+---------------+---------+-----------+----------+--------------+ PTV      Full                                                        +---------+---------------+---------+-----------+----------+--------------+  PERO                                                  Not visualized +---------+---------------+---------+-----------+----------+--------------+     Summary: RIGHT: - There is no evidence of deep vein thrombosis in the lower extremity. However, portions of this examination were limited- see technologist comments above.  - No cystic structure found in the popliteal fossa.  LEFT: - There is no evidence of deep vein thrombosis in the lower extremity. However, portions of this examination were limited- see technologist comments above.  - No cystic structure found in the popliteal fossa.  *See table(s) above for measurements and observations. Electronically signed by Deitra Mayo MD on 12/14/2019 at 1:02:56 PM.    Final     Cardiac Studies   2D echo IMPRESSIONS    1. There is severe aortic stenosis. V max 2.1 m/s, MG 10 mmHG, AVA 0.87  cm2, DI 0.25. This is low-flow low-gradient AS in the setting of LV  dysfunction. SVI=15 cc/m2. Consider aortic valve calcium score for  clarification. The aortic valve is tricuspid.  Aortic valve regurgitation is trivial. Severe aortic valve stenosis.  2. Moderate to severe calcific mitral stenosis. MG 2.9 mmHG with MVA 1.19  cm2 by VTI. Gradient also lower than expected due to low flow  state  (SVI=15 cc/m2). The mitral valve is degenerative. Trivial mitral valve  regurgitation. Moderate to severe mitral  stenosis. Moderate to severe mitral annular calcification.  3. Left ventricular ejection fraction, by estimation, is 40 to 45%. The  left ventricle has mildly decreased function. The left ventricle  demonstrates global hypokinesis. There is mild concentric left ventricular  hypertrophy. Left ventricular diastolic  function could not be evaluated.  4. Right ventricular systolic function is mildly reduced. The right  ventricular size is normal. There is normal pulmonary artery systolic  pressure. The estimated right ventricular systolic pressure is 57.8 mmHg.  5. Left atrial size was mildly dilated.  6. The inferior vena cava is normal in size with greater than 50%  respiratory variability, suggesting right atrial pressure of 3 mmHg.   Comparison(s): Prior images unable to be directly viewed, comparison made  by report only. Changes from prior study are noted. EF 40-45%. Low-flow  low-gradient severe aortic stenosis is present. Moderate to severe  calcific mitral stenosis is present.   Cath 05/22/9627  LV end diastolic pressure is normal. Right heart cath pressures are normal  There is moderate left ventricular systolic dysfunction. Based on cardiac output-index (Fick)  Ramus lesion is 85% stenosed. Calcified, ostial lesion  Mid LAD lesion is 50% stenosed. Dist LAD lesion is 65% stenosed. -Calcified lesions.  Moderate aortic stenosis  SUMMARY  Severe single-vessel disease involving calcified, ostial RAMUS INTERMEDIUS 85% as well as moderate lesions in the proximal and mid LAD that are heavily calcified -> neither lesion is febrile for PCI given location and calcification  Relatively normal RIGHT HEART CATH PRESSURES and LV EDP.  Moderate aortic valve stenosis with mean gradient of 10 mmHg.  Moderately reduced Cardiac function with a CARDIAC OUTPUT 4.4,  CARDIAC INDEX 2.04   RECOMMENDATION  Discharge home after bedrest  For now would opt for medical management of the RAMUS INTERMEDIUS lesion (would not be amenable for stent placement, would only be amenable for PTCA which given the extent of calcification and proximity to  large LAD and circumflex make it less than favorable.  We will add Imdur 30 mg daily  May consider adding low-dose diuretic, and converting to long-acting Toprol from metoprolol tartrate given reduced cardiac output  Provided symptoms can be controlled medically, should not adversely affect hopes for shoulder surgery. FOR FUTURE CARDIAC CATHETERIZATION, WOULD NOT USE RADIAL ACCESS.  Patient Profile     86 y.o. male with a hx of CAD, moderate ischemic cardiomyopathy, persistent atrial fibrillation, history of subarachnoid bleed, aortic stenosis, hypertension and hyperlipidemia who is being seen  for the evaluation of CHF at the request of Dr. Sloan Leiter.  Assessment & Plan    1. Chronic dyspnea on exertion       -The degree of dyspnea on exertion cannot fully explained by the degree of volume overload.  He clearly is deconditioning due to sedentary lifestyle.  On physical exam, he also has very fine crackles in the base of the lung, we suspect patient likely has a component of pulmonary fibrosis  -HRCT confirmed presence of ILD with probable UIP  -recommend pulmonary consult  -2D echo showed severe low flow low gradient AS and moderate MS which are also likely playing a role   -afib with RVR also likely playing a role  -2D echo also showed mild LV dysfunction but likely not as major a player in SOB as AS/MS and ILD  2. Acute on chronic combined systolic and diastolic heart failure       -He does not appear to be significantly volume overloaded.    -Chest CT with no mention of edema  -he has trivial LE edema likely related to severe AS as well as low albumin and chronic venous insuff  -I&O's incomplete  -lasix  stopped yesterday due to bump in SCR which has improved to 1.23 today  -added TED hose for LE edema which is minimal    3. CAD: Last cardiac catheterization in 2020 showed 85% ramus lesion, moderate LAD lesion, this was managed medically  -could possibly have worsening of his LAD stenosis but suspect primary pulmonary issues are main factor in his SOB  -continue ASA, statin and BB  4. Persistent atrial fibrillation with RVR:   -although he has been compliant with home metoprolol dosing, heart rate has not been very well controlled during this admission.    -lopressor increased to 50 mg twice daily yesterday and HR in the 90's to low 100's  -continue Eliquis and current dose of Lopressor  5. History of subarachnoid hematoma:   -He was not on any anticoagulation therapy for a long time due to history of subarachnoid hematoma, Eliquis was started in August.  6. Aortic stenosis:   -Previous echo obtained at [facility] in 2020 suggested severe aortic stenosis, however this appeared to be more moderate on the cardiac catheterization  -repeat echo shows severe low flow low gradient AS and moderate MS  -he is not a surgical candidate for valve replacement given underlying lung disease  -would recommend full pulmonary workup prior to consideration of any further workup of valvular heart disease  - palliative care consult has been placed  7. Hypertension  -BP much better controlled today  -continue Lopressor to 50mg  BID  8. Hyperlipidemia  -continue statin    I have spent a total of 35 minutes with patient reviewing 2D echo, HRCT , telemetry, EKGs, labs and examining patient as well as establishing an assessment and plan that was discussed with the patient.  > 50% of time  was spent in direct patient care.    For questions or updates, please contact [place] Please consult [email or web address] for contact info under        Signed, Fransico Him, MD  12/16/2019, 8:33 AM

## 2019-12-16 NOTE — Consult Note (Signed)
Consultation Note Date: 12/16/2019   Patient Name: Larry Church  DOB: December 03, 1932  MRN: [id number]  Age / Sex: 86 y.o., male  PCP: Lilian Coma., MD Referring Physician: Barb Merino, MD  Reason for Consultation: Establishing goals of care  HPI/Patient Profile: 86 y.o. male     admitted on 12/13/2019    Clinical Assessment and Goals of Care: 86 year old gentleman with a history of coronary artery disease, ischemic cardiomyopathy, atrial fibrillation history of subarachnoid bleed, aortic stenosis hypertension and dyslipidemia.  Patient admitted with chronic dyspnea on exertion, acute on chronic combined systolic and diastolic heart failure, persistent atrial fibrillation, found to have interstitial lung disease on high-resolution CT, also found to have moderate MS.  Patient has been admitted to hospital medicine has been seen by cardiology in this hospitalization.  Palliative services consultation for ongoing goals of care discussions has also been requested for additional support.  Larry Church is awake alert resting in bed.  He is finishing breakfast.  He states that he has occasional chronic cough and brings up clear sputum.  He denies any chest pain.  I introduced myself and palliative care as follows: Palliative medicine is specialized medical care for people living with serious illness. It focuses on providing relief from the symptoms and stress of a serious illness. The goal is to improve quality of life for both the patient and the family.  Goals of care: Broad aims of medical therapy in relation to the patient's values and preferences. Our aim is to provide medical care aimed at enabling patients to achieve the goals that matter most to them, given the circumstances of their particular medical situation and their constraints.   Goals wishes and values important to the patient attempted to be explored.   Brief life review performed.  Patient states that he lives near the [place] area, has lived there all his life.  He states that his values are that he believes and hard work and being productive.  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  Patient is aware of his current health.  We talked at some length about heart failure, valve abnormalities, lung disease.  We discussed about issues such as symptom management and focusing on quality of life.  Differences between hospice and palliative discussed.  All of his questions answered to the best of my ability.  See below.  Thank you for the consult.  NEXT OF KIN Wife, daughter   [place] with DO NOT RESUSCITATE Recommend home with home health care, home-based palliative services.  Discussed with patient in detail about end stage heart failure signs and symptoms.  Patient's goals are to be as well as he can for as long as he can, and to be in his home environment for as long as possible.  Code Status/Advance Care Planning:  DNR    Symptom Management:   As above  Palliative Prophylaxis:  Delirium Protocol   Psycho-social/Spiritual:   Desire for further Chaplaincy support:yes  Additional Recommendations: Caregiving  Support/Resources  Prognosis:   < 12 months?  Discharge Planning: Home with Palliative Services      Primary Diagnoses: Present on Admission: . Acute on chronic combined systolic and diastolic CHF (congestive heart failure) ([place]) . CAD (coronary artery disease) . DOE (dyspnea on exertion) . BPH (benign prostatic hyperplasia)   I have reviewed the medical record, interviewed the patient and family, and examined the patient. The following aspects are pertinent.  Past Medical History:  Diagnosis Date  . Anxiety   . Arthritis    "bad; all over" (09/24/2014)  . Asthma   . Basal cell  carcinoma    "cut and burned off  top of head and face"  . Chronic bronchitis ([place])    "got it q yr til this year" (09/24/2014)  . Chronic lower back pain   . Chronic sinusitis   . Coronary artery disease    LHC 10/15: pLAD 20-30, dLAD 50, origin D1 40; oLCx 40 then irregs; oRI 20, RCA diff irregs up to 10-20  . Degenerative arthritis   . Depression   . Echocardiograms    Echo 10/19: EF 55-60, no RWMA, mild AS (mean 14, peak 26), mild to mod AI, ascending aorta 40 mm, MAC, mild LAE, mild reduced RVSF, mild TR, PASP 43  . GERD (gastroesophageal reflux disease)   . Hypercholesterolemia   . Leg pain    ABIs 11/17: normal  . LV dysfunction    Echo 10/15: Mod LVH, EF 40-50, no apical clot, aortic sclerosis, MAC, trivial MR, mild LAE  . Nuclear stress test    Nuclear stress test 10/19: EF 46, no ischemia; intermediate risk  . OSA on CPAP   . PVC's (premature ventricular contractions)    "I inherited a 4th skip from my mother"  . Seasonal allergies    Social History   Socioeconomic History  . Marital status: Married    Spouse name: Dixon Boos  . Number of children: 3  . Years of education: Not on file  . Highest education level: Not on file  Occupational History  . Not on file  Tobacco Use  . Smoking status: Former Smoker    Packs/day: 1.00    Years: 4.00    Pack years: 4.00    Types: Cigarettes    Quit date: 02/16/1960    Years since quitting: 59.8  . Smokeless tobacco: Never Used  Vaping Use  . Vaping Use: Never used  Substance and Sexual Activity  . Alcohol use: No  . Drug use: No  . Sexual activity: Not Currently  Other Topics Concern  . Not on file  Social History Narrative  . Not on file   Social Determinants of Health   Financial Resource Strain:   . Difficulty of Paying Living Expenses: Not on file  Food Insecurity:   . Worried About Charity fundraiser in the Last Year: Not on file  . Ran Out of Food in the Last Year: Not on file  Transportation Needs:   .  Lack of Transportation (Medical): Not on file  . Lack of Transportation (Non-Medical): Not on file  Physical Activity:   . Days of Exercise per Week: Not on file  . Minutes of Exercise per Session: Not on file  Stress:   . Feeling of Stress : Not on file  Social Connections:   . Frequency of Communication with Friends and  Family: Not on file  . Frequency of Social Gatherings with Friends and Family: Not on file  . Attends Religious Services: Not on file  . Active Member of Clubs or Organizations: Not on file  . Attends Archivist Meetings: Not on file  . Marital Status: Not on file   Family History  Problem Relation Age of Onset  . Cancer Mother   . Cancer Brother   . Heart attack Neg Hx   . Stroke Neg Hx   . Hypertension Neg Hx    Scheduled Meds: . alfuzosin  10 mg Oral Q breakfast  . apixaban  5 mg Oral BID  . aspirin EC  81 mg Oral Daily  . atorvastatin  40 mg Oral Daily  . cephALEXin  500 mg Oral TID  . finasteride  5 mg Oral Daily  . gabapentin  100 mg Oral BID  . isosorbide mononitrate  15 mg Oral Daily  . memantine  10 mg Oral BID  . metoprolol tartrate  50 mg Oral BID  . pantoprazole  40 mg Oral Daily  . sodium chloride flush  3 mL Intravenous Q12H  . venlafaxine XR  150 mg Oral Daily  . venlafaxine XR  75 mg Oral QHS   Continuous Infusions: . sodium chloride     PRN Meds:.sodium chloride, acetaminophen, nitroGLYCERIN, ondansetron (ZOFRAN) IV, sodium chloride flush, traMADol Medications Prior to Admission:  Prior to Admission medications   Medication Sig Start Date End Date Taking? Authorizing Provider  acetaminophen (TYLENOL) 500 MG tablet Take 500-1,000 mg by mouth every 6 (six) hours as needed for moderate pain or headache.    Yes [provider]  albuterol (VENTOLIN HFA) 108 (90 Base) MCG/ACT inhaler Inhale 2 puffs into the lungs every 6 (six) hours as needed for wheezing or shortness of breath.   Yes [provider]  alfuzosin  (UROXATRAL) 10 MG 24 hr tablet Take 10 mg by mouth daily with breakfast.   Yes [provider]  apixaban (ELIQUIS) 5 MG TABS tablet Take 5 mg by mouth 2 (two) times daily.   Yes [provider]  aspirin EC 81 MG tablet Take 81 mg by mouth daily. Swallow whole.   Yes [provider]  atorvastatin (LIPITOR) 40 MG tablet Take 40 mg by mouth daily.   Yes [provider]  azelastine (ASTELIN) 0.1 % nasal spray Place 1 spray into both nostrils daily. Use in each nostril as directed   Yes [provider]  cephALEXin (KEFLEX) 500 MG capsule Take 1 capsule (500 mg total) by mouth 3 (three) times daily for 7 days. Patient taking differently: Take 500 mg by mouth 3 (three) times daily. Start Date : 12/10/19 12/09/19 12/16/19 Yes Little, Wenda Overland, MD  Dextromethorphan-Guaifenesin [facility] DM MAXIMUM STRENGTH) 60-1200 MG TB12 Take 1 tablet by mouth daily.    Yes [provider]  EPINEPHrine (EPIPEN 2-PAK) 0.3 mg/0.3 mL IJ SOAJ injection Inject 0.3 mLs (0.3 mg total) into the muscle once. Patient taking differently: Inject 0.3 mg into the muscle daily as needed for anaphylaxis.  11/25/14  Yes Bardelas, Jens Som, MD  finasteride (PROSCAR) 5 MG tablet Take 5 mg by mouth daily.   Yes [provider]  fluticasone (FLONASE) 50 MCG/ACT nasal spray 2 sprays per nostril once a day if needed for stuffy nose use daily until end of june Patient taking differently: Place 2 sprays into both nostrils daily as needed for allergies.  07/02/19  Yes Bardelas, Jacqulyn Bath  A, MD  gabapentin (NEURONTIN) 100 MG capsule Take 100 mg by mouth 2 (two) times daily.   Yes [provider]  ipratropium (ATROVENT) 0.06 % nasal spray Place 2 sprays into both nostrils 2 (two) times a day.  08/15/18  Yes [provider]  isosorbide mononitrate (IMDUR) 30 MG 24 hr tablet Take 1 tablet (30 mg total) by mouth daily. 06/26/19  Yes Weaver, Scott T, PA-C  levocetirizine (XYZAL) 5  MG tablet Take 5 mg by mouth every evening.   Yes [provider]  memantine (NAMENDA) 10 MG tablet Take 10 mg by mouth 2 (two) times daily.   Yes [provider]  metoprolol tartrate (LOPRESSOR) 25 MG tablet Take 0.5 tablets (12.5 mg total) by mouth 2 (two) times daily. Patient taking differently: Take 12.5 mg by mouth daily.  11/27/19  Yes Fay Records, MD  nitroGLYCERIN (NITROSTAT) 0.4 MG SL tablet Place 0.4 mg under the tongue every 5 (five) minutes as needed for chest pain.   Yes [provider]  omeprazole (PRILOSEC) 40 MG capsule Take 40 mg by mouth in the morning and at bedtime.   Yes [provider]  Polyethyl Glycol-Propyl Glycol (SYSTANE) 0.4-0.3 % SOLN Place 1 drop into both eyes 3 (three) times daily as needed (dry/irritated eyes.).   Yes [provider]  triamcinolone cream (KENALOG) 0.1 % Apply 1 application topically 2 (two) times daily.   Yes [provider]  venlafaxine XR (EFFEXOR-XR) 75 MG 24 hr capsule Take 75-150 mg by mouth See admin instructions. 150 mg in the morning, 75 mg at bedtime   Yes [provider]  aspirin EC 81 MG tablet Take 1 tablet (81 mg total) by mouth daily. Patient not taking: Reported on 12/13/2019 03/17/17   Fay Records, MD  furosemide (LASIX) 20 MG tablet Take 1 tablet (20 mg total) by mouth daily. 3 times weekly as needed 12/16/19   Barb Merino, MD  metoprolol tartrate (LOPRESSOR) 50 MG tablet Take 1 tablet (50 mg total) by mouth 2 (two) times daily. 12/16/19 01/15/20  Barb Merino, MD  traMADol (ULTRAM) 50 MG tablet Take 1 tablet (50 mg total) by mouth every 4 (four) hours as needed for moderate pain. Patient not taking: Reported on 12/13/2019 01/19/18   Charlynne Cousins, MD   Allergies  Allergen Reactions  . Penicillins Anaphylaxis and Swelling    Has patient had a PCN reaction causing immediate rash, facial/tongue/throat swelling, SOB or lightheadedness with hypotension:  Yes Has patient had a PCN reaction causing severe rash involving mucus membranes or skin necrosis: No Has patient had a PCN reaction that required hospitalization: No Has patient had a PCN reaction occurring within the last 10 years: No If all of the above answers are "NO", then may proceed with Cephalosporin use.  Marland Kitchen Penicillins Anaphylaxis  . Bee Venom Swelling  . Donepezil Other (See Comments)    Vertigo  . Methocarbamol     Tiredness   Review of Systems Some cough, some shortness of breath at times.   Physical Exam Awake alert resting in bed, able to feed himself breakfast No distress Occasional cough, brings up clear sputum, states that he has sinus trouble at times Irregularly irregular Clear breath sounds anterior lung fields Abdomen distended Has trace lower extremity edema Awake alert nonfocal Mood and affect within normal limits  Vital Signs: BP 111/70 (BP Location: Right Arm)   Pulse 90   Temp 97.6 F (36.4 C)   Resp 20  Ht _0  (1.676 m)   Wt 106.8 kg   SpO2 93%   BMI 38.00 kg/m  Pain Scale: 0-10 POSS *See Group Information*: 1-Acceptable,Awake and alert Pain Score: 0-No pain   SpO2: SpO2: 93 % O2 Device:SpO2: 93 % O2 Flow Rate: .O2 Flow Rate (L/min): 2 L/min  IO: Intake/output summary:   Intake/Output Summary (Last 24 hours) at 12/16/2019 1033 Last data filed at 12/16/2019 0300 Gross per 24 hour  Intake 700 ml  Output 400 ml  Net 300 ml    LBM: Last BM Date: 12/12/19 Baseline Weight: Weight: 108.9 kg Most recent weight: Weight: 106.8 kg     Palliative Assessment/Data:   PPS 50%  Time In:  9.30 Time Out:  10.30 Time Total:  60  Greater than 50%  of this time was spent counseling and coordinating care related to the above assessment and plan.  Signed by: Loistine Chance, MD   Please contact Palliative Medicine Team phone at [phone] for questions and concerns.  For individual provider: See Shea Evans

## 2019-12-16 NOTE — TOC Progression Note (Signed)
Transition of Care [facility]) - Progression Note    Patient Details  Name: [name] MRN: [id number] Date of Birth: 1932/12/14  Transition of Care [facility]) CM/SW Contact  Joaquin Courts, RN Phone Number: 12/16/2019, 11:05 AM  Clinical Narrative:    [facility] to provide HHPT services.    Expected Discharge Plan: [place] Barriers to Discharge: No Barriers Identified  Expected Discharge Plan and Services Expected Discharge Plan: [place]   Discharge Planning Services: CM Consult   Living arrangements for the past 2 months: Single Family Home Expected Discharge Date: 12/16/19                         HH Arranged: PT HH Agency: [facility] Date [place]: 12/16/19 Time [place]: 1105 Representative spoke with at [place]: Amy   Social Determinants of Health ([place]) Interventions    Readmission Risk Interventions No flowsheet data found.

## 2019-12-16 NOTE — Plan of Care (Signed)
  Problem: Activity: Goal: Capacity to carry out activities will improve Outcome: Adequate for Discharge   Problem: Cardiac: Goal: Ability to achieve and maintain adequate cardiopulmonary perfusion will improve Outcome: Adequate for Discharge   Problem: Education: Goal: Ability to verbalize understanding of medication therapies will improve Outcome: Adequate for Discharge

## 2019-12-17 ENCOUNTER — Encounter: Payer: Self-pay | Admitting: Physician Assistant

## 2019-12-17 ENCOUNTER — Telehealth: Payer: Self-pay | Admitting: Internal Medicine

## 2019-12-17 NOTE — Telephone Encounter (Signed)
    Pt c/o medication issue:  1. Name of Medication: isosorbide mononitrate (IMDUR) 30 MG 24 hr tablet  2. How are you currently taking this medication (dosage and times per day)? Take 1 tablet (30 mg total) by mouth daily.  3. Are you having a reaction (difficulty breathing--STAT)?   4. What is your medication issue? Larry Church would like to speak with [name] about this medication

## 2019-12-17 NOTE — Telephone Encounter (Signed)
I spoke with patient's wife.  She got the answer to her question.  Her daughter who had been helping take care of the patient was able to help her.  Nothing further was needed today.

## 2019-12-17 NOTE — Telephone Encounter (Signed)
error 

## 2019-12-20 ENCOUNTER — Telehealth: Payer: Self-pay | Admitting: Internal Medicine

## 2019-12-20 NOTE — Telephone Encounter (Signed)
Spoke with patient's daughter, Seth Bake, regarding the Palliative referral/services and all questions were answered and she was in agreement with scheduling visit.  I have scheduled an In-person Consult for 12/26/19 @ 8:30 AM.

## 2019-12-26 ENCOUNTER — Other Ambulatory Visit: Payer: Self-pay

## 2019-12-26 ENCOUNTER — Other Ambulatory Visit: Payer: Medicare Other | Admitting: Internal Medicine

## 2020-01-06 ENCOUNTER — Other Ambulatory Visit: Payer: Self-pay

## 2020-01-06 ENCOUNTER — Inpatient Hospital Stay (HOSPITAL_COMMUNITY)
Admission: EM | Admit: 2020-01-06 | Discharge: 2020-01-16 | DRG: 291 | Disposition: E | Payer: Medicare Other | Attending: Internal Medicine | Admitting: Internal Medicine

## 2020-01-06 ENCOUNTER — Encounter (HOSPITAL_COMMUNITY): Payer: Self-pay

## 2020-01-06 ENCOUNTER — Emergency Department (HOSPITAL_COMMUNITY): Payer: Medicare Other

## 2020-01-06 DIAGNOSIS — S2249XA Multiple fractures of ribs, unspecified side, initial encounter for closed fracture: Secondary | ICD-10-CM

## 2020-01-06 DIAGNOSIS — I4891 Unspecified atrial fibrillation: Secondary | ICD-10-CM | POA: Diagnosis not present

## 2020-01-06 DIAGNOSIS — I10 Essential (primary) hypertension: Secondary | ICD-10-CM | POA: Diagnosis not present

## 2020-01-06 DIAGNOSIS — S066XAA Traumatic subarachnoid hemorrhage with loss of consciousness status unknown, initial encounter: Secondary | ICD-10-CM | POA: Diagnosis present

## 2020-01-06 DIAGNOSIS — S2242XA Multiple fractures of ribs, left side, initial encounter for closed fracture: Secondary | ICD-10-CM | POA: Diagnosis present

## 2020-01-06 DIAGNOSIS — I2510 Atherosclerotic heart disease of native coronary artery without angina pectoris: Secondary | ICD-10-CM | POA: Diagnosis present

## 2020-01-06 DIAGNOSIS — Z880 Allergy status to penicillin: Secondary | ICD-10-CM

## 2020-01-06 DIAGNOSIS — I110 Hypertensive heart disease with heart failure: Principal | ICD-10-CM | POA: Diagnosis present

## 2020-01-06 DIAGNOSIS — Z955 Presence of coronary angioplasty implant and graft: Secondary | ICD-10-CM

## 2020-01-06 DIAGNOSIS — Z515 Encounter for palliative care: Secondary | ICD-10-CM | POA: Diagnosis not present

## 2020-01-06 DIAGNOSIS — E871 Hypo-osmolality and hyponatremia: Secondary | ICD-10-CM | POA: Diagnosis present

## 2020-01-06 DIAGNOSIS — E785 Hyperlipidemia, unspecified: Secondary | ICD-10-CM | POA: Diagnosis present

## 2020-01-06 DIAGNOSIS — Z7901 Long term (current) use of anticoagulants: Secondary | ICD-10-CM

## 2020-01-06 DIAGNOSIS — F0390 Unspecified dementia without behavioral disturbance: Secondary | ICD-10-CM | POA: Diagnosis present

## 2020-01-06 DIAGNOSIS — I4811 Longstanding persistent atrial fibrillation: Secondary | ICD-10-CM | POA: Diagnosis present

## 2020-01-06 DIAGNOSIS — Z809 Family history of malignant neoplasm, unspecified: Secondary | ICD-10-CM

## 2020-01-06 DIAGNOSIS — I4821 Permanent atrial fibrillation: Secondary | ICD-10-CM | POA: Diagnosis present

## 2020-01-06 DIAGNOSIS — I350 Nonrheumatic aortic (valve) stenosis: Secondary | ICD-10-CM | POA: Diagnosis not present

## 2020-01-06 DIAGNOSIS — M159 Polyosteoarthritis, unspecified: Secondary | ICD-10-CM | POA: Diagnosis present

## 2020-01-06 DIAGNOSIS — Z96652 Presence of left artificial knee joint: Secondary | ICD-10-CM | POA: Diagnosis present

## 2020-01-06 DIAGNOSIS — W1830XA Fall on same level, unspecified, initial encounter: Secondary | ICD-10-CM | POA: Diagnosis present

## 2020-01-06 DIAGNOSIS — E7800 Pure hypercholesterolemia, unspecified: Secondary | ICD-10-CM | POA: Diagnosis present

## 2020-01-06 DIAGNOSIS — Z20822 Contact with and (suspected) exposure to covid-19: Secondary | ICD-10-CM | POA: Diagnosis present

## 2020-01-06 DIAGNOSIS — I420 Dilated cardiomyopathy: Secondary | ICD-10-CM | POA: Diagnosis present

## 2020-01-06 DIAGNOSIS — Z66 Do not resuscitate: Secondary | ICD-10-CM | POA: Diagnosis present

## 2020-01-06 DIAGNOSIS — G4733 Obstructive sleep apnea (adult) (pediatric): Secondary | ICD-10-CM | POA: Diagnosis present

## 2020-01-06 DIAGNOSIS — G3184 Mild cognitive impairment, so stated: Secondary | ICD-10-CM | POA: Diagnosis present

## 2020-01-06 DIAGNOSIS — F32A Depression, unspecified: Secondary | ICD-10-CM | POA: Diagnosis present

## 2020-01-06 DIAGNOSIS — R5381 Other malaise: Secondary | ICD-10-CM

## 2020-01-06 DIAGNOSIS — Z7189 Other specified counseling: Secondary | ICD-10-CM

## 2020-01-06 DIAGNOSIS — S271XXA Traumatic hemothorax, initial encounter: Secondary | ICD-10-CM | POA: Diagnosis present

## 2020-01-06 DIAGNOSIS — M5137 Other intervertebral disc degeneration, lumbosacral region: Secondary | ICD-10-CM | POA: Diagnosis present

## 2020-01-06 DIAGNOSIS — I493 Ventricular premature depolarization: Secondary | ICD-10-CM | POA: Diagnosis present

## 2020-01-06 DIAGNOSIS — J9621 Acute and chronic respiratory failure with hypoxia: Secondary | ICD-10-CM | POA: Diagnosis present

## 2020-01-06 DIAGNOSIS — Z6838 Body mass index (BMI) 38.0-38.9, adult: Secondary | ICD-10-CM

## 2020-01-06 DIAGNOSIS — S066X9A Traumatic subarachnoid hemorrhage with loss of consciousness of unspecified duration, initial encounter: Secondary | ICD-10-CM | POA: Diagnosis present

## 2020-01-06 DIAGNOSIS — I5043 Acute on chronic combined systolic (congestive) and diastolic (congestive) heart failure: Secondary | ICD-10-CM | POA: Diagnosis present

## 2020-01-06 DIAGNOSIS — Z981 Arthrodesis status: Secondary | ICD-10-CM

## 2020-01-06 DIAGNOSIS — R52 Pain, unspecified: Secondary | ICD-10-CM

## 2020-01-06 DIAGNOSIS — N400 Enlarged prostate without lower urinary tract symptoms: Secondary | ICD-10-CM | POA: Diagnosis present

## 2020-01-06 DIAGNOSIS — Z9181 History of falling: Secondary | ICD-10-CM

## 2020-01-06 DIAGNOSIS — G8929 Other chronic pain: Secondary | ICD-10-CM | POA: Diagnosis present

## 2020-01-06 DIAGNOSIS — Z7982 Long term (current) use of aspirin: Secondary | ICD-10-CM

## 2020-01-06 DIAGNOSIS — Z79899 Other long term (current) drug therapy: Secondary | ICD-10-CM

## 2020-01-06 DIAGNOSIS — R296 Repeated falls: Secondary | ICD-10-CM | POA: Diagnosis present

## 2020-01-06 DIAGNOSIS — J45909 Unspecified asthma, uncomplicated: Secondary | ICD-10-CM | POA: Diagnosis present

## 2020-01-06 DIAGNOSIS — Z87891 Personal history of nicotine dependence: Secondary | ICD-10-CM

## 2020-01-06 DIAGNOSIS — J84112 Idiopathic pulmonary fibrosis: Secondary | ICD-10-CM | POA: Diagnosis present

## 2020-01-06 DIAGNOSIS — Z85828 Personal history of other malignant neoplasm of skin: Secondary | ICD-10-CM

## 2020-01-06 DIAGNOSIS — R0602 Shortness of breath: Secondary | ICD-10-CM | POA: Diagnosis not present

## 2020-01-06 DIAGNOSIS — Z888 Allergy status to other drugs, medicaments and biological substances status: Secondary | ICD-10-CM

## 2020-01-06 DIAGNOSIS — M545 Low back pain, unspecified: Secondary | ICD-10-CM | POA: Diagnosis present

## 2020-01-06 DIAGNOSIS — Z96643 Presence of artificial hip joint, bilateral: Secondary | ICD-10-CM | POA: Diagnosis present

## 2020-01-06 DIAGNOSIS — I352 Nonrheumatic aortic (valve) stenosis with insufficiency: Secondary | ICD-10-CM | POA: Diagnosis present

## 2020-01-06 DIAGNOSIS — I255 Ischemic cardiomyopathy: Secondary | ICD-10-CM | POA: Diagnosis present

## 2020-01-06 DIAGNOSIS — K219 Gastro-esophageal reflux disease without esophagitis: Secondary | ICD-10-CM | POA: Diagnosis present

## 2020-01-06 DIAGNOSIS — R451 Restlessness and agitation: Secondary | ICD-10-CM | POA: Diagnosis not present

## 2020-01-06 DIAGNOSIS — Z91030 Bee allergy status: Secondary | ICD-10-CM

## 2020-01-06 LAB — CBC
HCT: 40.4 % (ref 39.0–52.0)
Hemoglobin: 12.9 g/dL — ABNORMAL LOW (ref 13.0–17.0)
MCH: 33.8 pg (ref 26.0–34.0)
MCHC: 31.9 g/dL (ref 30.0–36.0)
MCV: 105.8 fL — ABNORMAL HIGH (ref 80.0–100.0)
Platelets: 276 10*3/uL (ref 150–400)
RBC: 3.82 MIL/uL — ABNORMAL LOW (ref 4.22–5.81)
RDW: 17.1 % — ABNORMAL HIGH (ref 11.5–15.5)
WBC: 14.2 10*3/uL — ABNORMAL HIGH (ref 4.0–10.5)
nRBC: 0.2 % (ref 0.0–0.2)

## 2020-01-06 LAB — RESPIRATORY PANEL BY RT PCR (FLU A&B, COVID)
Influenza A by PCR: NEGATIVE
Influenza B by PCR: NEGATIVE
SARS Coronavirus 2 by RT PCR: NEGATIVE

## 2020-01-06 LAB — BASIC METABOLIC PANEL
Anion gap: 12 (ref 5–15)
BUN: 20 mg/dL (ref 8–23)
CO2: 21 mmol/L — ABNORMAL LOW (ref 22–32)
Calcium: 9.4 mg/dL (ref 8.9–10.3)
Chloride: 99 mmol/L (ref 98–111)
Creatinine, Ser: 1.13 mg/dL (ref 0.61–1.24)
GFR, Estimated: >60 mL/min (ref >60)
Glucose, Bld: 157 mg/dL — ABNORMAL HIGH (ref 70–99)
Potassium: 4.5 mmol/L (ref 3.5–5.1)
Sodium: 132 mmol/L — ABNORMAL LOW (ref 135–145)

## 2020-01-06 LAB — BRAIN NATRIURETIC PEPTIDE: B Natriuretic Peptide: 363.7 pg/mL — ABNORMAL HIGH (ref 0.0–100.0)

## 2020-01-06 LAB — TROPONIN I (HIGH SENSITIVITY): Troponin I (High Sensitivity): 27 ng/L — ABNORMAL HIGH (ref <18)

## 2020-01-06 MED ORDER — PANTOPRAZOLE SODIUM 40 MG PO TBEC
40.0000 mg | DELAYED_RELEASE_TABLET | Freq: Every day | ORAL | Status: DC
  Administered 2020-01-08: 40 mg via ORAL
  Filled 2020-01-06 (×2): qty 1

## 2020-01-06 MED ORDER — DILTIAZEM HCL-DEXTROSE 125-5 MG/125ML-% IV SOLN (PREMIX)
5.0000 mg/h | INTRAVENOUS | Status: DC
  Administered 2020-01-06: 5 mg/h via INTRAVENOUS
  Administered 2020-01-07 – 2020-01-08 (×4): 15 mg/h via INTRAVENOUS
  Filled 2020-01-06 (×5): qty 125

## 2020-01-06 MED ORDER — DILTIAZEM LOAD VIA INFUSION
15.0000 mg | Freq: Once | INTRAVENOUS | Status: AC
  Administered 2020-01-06: 15 mg via INTRAVENOUS
  Filled 2020-01-06: qty 15

## 2020-01-06 MED ORDER — ALBUTEROL SULFATE HFA 108 (90 BASE) MCG/ACT IN AERS
2.0000 | INHALATION_SPRAY | Freq: Four times a day (QID) | RESPIRATORY_TRACT | Status: DC | PRN
  Filled 2020-01-06: qty 6.7

## 2020-01-06 MED ORDER — SODIUM CHLORIDE 0.9% FLUSH
3.0000 mL | Freq: Two times a day (BID) | INTRAVENOUS | Status: DC
  Administered 2020-01-07 – 2020-01-10 (×4): 3 mL via INTRAVENOUS

## 2020-01-06 MED ORDER — VENLAFAXINE HCL ER 75 MG PO CP24
150.0000 mg | ORAL_CAPSULE | Freq: Every day | ORAL | Status: DC
  Administered 2020-01-08: 150 mg via ORAL
  Filled 2020-01-06: qty 1
  Filled 2020-01-06: qty 2

## 2020-01-06 MED ORDER — ACETAMINOPHEN 325 MG PO TABS: 650.0000 mg | ORAL_TABLET | Freq: Four times a day (QID) | ORAL | Status: DC | PRN

## 2020-01-06 MED ORDER — POLYVINYL ALCOHOL 1.4 % OP SOLN: 1.0000 [drp] | Freq: Three times a day (TID) | OPHTHALMIC | Status: DC | PRN

## 2020-01-06 MED ORDER — GABAPENTIN 100 MG PO CAPS
100.0000 mg | ORAL_CAPSULE | Freq: Three times a day (TID) | ORAL | Status: DC
  Administered 2020-01-07 – 2020-01-08 (×4): 100 mg via ORAL
  Filled 2020-01-06 (×6): qty 1

## 2020-01-06 MED ORDER — MEMANTINE HCL 10 MG PO TABS
10.0000 mg | ORAL_TABLET | Freq: Two times a day (BID) | ORAL | Status: DC
  Administered 2020-01-07 – 2020-01-08 (×2): 10 mg via ORAL
  Filled 2020-01-06 (×4): qty 1

## 2020-01-06 MED ORDER — DILTIAZEM HCL 25 MG/5ML IV SOLN: 15.0000 mg | Freq: Once | INTRAVENOUS | Status: DC

## 2020-01-06 MED ORDER — LEVOFLOXACIN IN D5W 750 MG/150ML IV SOLN
750.0000 mg | Freq: Once | INTRAVENOUS | Status: AC
  Administered 2020-01-06: 750 mg via INTRAVENOUS
  Filled 2020-01-06: qty 150

## 2020-01-06 MED ORDER — POLYETHYLENE GLYCOL 3350 17 G PO PACK: 17.0000 g | PACK | Freq: Every day | ORAL | Status: DC | PRN

## 2020-01-06 MED ORDER — VENLAFAXINE HCL ER 75 MG PO CP24: 75.0000 mg | ORAL_CAPSULE | ORAL | Status: DC

## 2020-01-06 MED ORDER — FENTANYL CITRATE (PF) 100 MCG/2ML IJ SOLN
50.0000 ug | Freq: Once | INTRAMUSCULAR | Status: AC
  Administered 2020-01-06: 50 ug via INTRAVENOUS
  Filled 2020-01-06: qty 2

## 2020-01-06 MED ORDER — ACETAMINOPHEN 650 MG RE SUPP: 650.0000 mg | Freq: Four times a day (QID) | RECTAL | Status: DC | PRN

## 2020-01-06 MED ORDER — FUROSEMIDE 20 MG PO TABS
20.0000 mg | ORAL_TABLET | Freq: Every day | ORAL | Status: DC
  Filled 2020-01-06: qty 1

## 2020-01-06 MED ORDER — ATORVASTATIN CALCIUM 40 MG PO TABS
40.0000 mg | ORAL_TABLET | Freq: Every day | ORAL | Status: DC
  Administered 2020-01-08: 40 mg via ORAL
  Filled 2020-01-06 (×2): qty 1

## 2020-01-06 MED ORDER — VENLAFAXINE HCL ER 75 MG PO CP24
75.0000 mg | ORAL_CAPSULE | Freq: Every day | ORAL | Status: DC
  Administered 2020-01-07 – 2020-01-08 (×2): 75 mg via ORAL
  Filled 2020-01-06 (×4): qty 1

## 2020-01-06 MED ORDER — IPRATROPIUM BROMIDE 0.06 % NA SOLN
2.0000 | Freq: Two times a day (BID) | NASAL | Status: DC
  Filled 2020-01-06: qty 15

## 2020-01-06 MED ORDER — METOPROLOL TARTRATE 50 MG PO TABS
50.0000 mg | ORAL_TABLET | Freq: Two times a day (BID) | ORAL | Status: DC
  Filled 2020-01-06: qty 2

## 2020-01-06 MED ORDER — ASPIRIN EC 81 MG PO TBEC
81.0000 mg | DELAYED_RELEASE_TABLET | Freq: Every day | ORAL | Status: DC
  Administered 2020-01-08: 81 mg via ORAL
  Filled 2020-01-06 (×2): qty 1

## 2020-01-06 MED ORDER — APIXABAN 5 MG PO TABS
5.0000 mg | ORAL_TABLET | Freq: Two times a day (BID) | ORAL | Status: DC
  Administered 2020-01-07 – 2020-01-08 (×3): 5 mg via ORAL
  Filled 2020-01-06 (×4): qty 1

## 2020-01-06 MED ORDER — ALFUZOSIN HCL ER 10 MG PO TB24
10.0000 mg | ORAL_TABLET | Freq: Every day | ORAL | Status: DC
  Administered 2020-01-08: 10 mg via ORAL
  Filled 2020-01-06 (×3): qty 1

## 2020-01-06 MED ORDER — ISOSORBIDE MONONITRATE ER 30 MG PO TB24
30.0000 mg | ORAL_TABLET | Freq: Every day | ORAL | Status: DC
  Administered 2020-01-08: 30 mg via ORAL
  Filled 2020-01-06 (×2): qty 1

## 2020-01-06 MED ORDER — FINASTERIDE 5 MG PO TABS
5.0000 mg | ORAL_TABLET | Freq: Every day | ORAL | Status: DC
  Administered 2020-01-08: 5 mg via ORAL
  Filled 2020-01-06 (×2): qty 1
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
## 2020-01-06 NOTE — ED Provider Notes (Signed)
[facility] EMERGENCY DEPARTMENT Provider Note   CSN: [id number] Arrival date & time: 12/25/2019  2006     History Shortness of breath  Larry Church is a 86 y.o. male.  HPI   Patient presents to the ED for evaluation of shortness of breath.  Patient has history of severe aortic stenosis, chronic A. fib and interstitial lung disease.  According to medical records patient's palliative care.  Patient had a fall the other day.  He has been having some pain on the left side of his ribs.  He has had a difficult time getting around.  Initially family called for EMS assistance not so much because of his breathing but more needing some assistance helping him get up.  When EMS arrived they noted the patient was hypoxic and he was also tachycardic.  He was started on nasal cannula oxygen.  Patient states he has been more for short of breath.  He has noticed leg swelling.  He is not had any fevers or chills.  He has been vaccinated for Covid. Prior records reviewed.  Patient was discharged from the hospital October 31.  At that time the patient was noted to have severe aortic stenosis and not a candidate for any surgical intervention.  Patient also was noted to have interstitial lung disease.  Patient was discharged without the need for any supplemental oxygen. Past Medical History:  Diagnosis Date  . Anxiety   . Arthritis    "bad; all over" (09/24/2014)  . Asthma   . Basal cell carcinoma    "cut and burned off  top of head and face"  . Chronic bronchitis ([place])    "got it q yr til this year" (09/24/2014)  . Chronic lower back pain   . Chronic sinusitis   . Coronary artery disease    LHC 10/15: pLAD 20-30, dLAD 50, origin D1 40; oLCx 40 then irregs; oRI 20, RCA diff irregs up to 10-20  . Degenerative arthritis   . Depression   . Echocardiograms    Echo 10/19: EF 55-60, no RWMA, mild AS (mean 14, peak 26), mild to mod AI, ascending aorta 40 mm, MAC, mild LAE, mild reduced RVSF,  mild TR, PASP 43  . GERD (gastroesophageal reflux disease)   . Hypercholesterolemia   . Leg pain    ABIs 11/17: normal  . LV dysfunction    Echo 10/15: Mod LVH, EF 40-50, no apical clot, aortic sclerosis, MAC, trivial MR, mild LAE  . Nuclear stress test    Nuclear stress test 10/19: EF 46, no ischemia; intermediate risk  . OSA on CPAP   . PVC's (premature ventricular contractions)    "I inherited a 4th skip from my mother"  . Seasonal allergies     Patient Active Problem List   Diagnosis Date Noted  . Acute CHF (congestive heart failure) ([place]) 12/13/2019  . E. coli UTI 12/13/2019  . Aortic stenosis, severe 09/19/2018  . Dilated cardiomyopathy ([place]) 09/19/2018  . DOE (dyspnea on exertion) 09/19/2018  . Acute on chronic combined systolic and diastolic CHF (congestive heart failure) ([place]) 09/19/2018  . Longstanding persistent atrial fibrillation ([place]) 01/14/2018  . Abdominal pain, right lower quadrant 01/14/2018  . Mild cognitive impairment 01/14/2018  . BPH (benign prostatic hyperplasia) 01/14/2018  . Macrocytosis 01/14/2018  . Mild intermittent asthma 01/16/2015  . Obstructive sleep apnea treated with bilevel positive airway pressure (BiPAP) 01/16/2015  . Gastroesophageal reflux disease without esophagitis 01/16/2015  . Current use of beta  blocker 01/16/2015  . Coughing 01/06/2015  . Primary osteoarthritis of left knee 09/24/2014  . Primary osteoarthritis of knee 09/24/2014  . Traumatic subarachnoid hemorrhage ([place]) 02/20/2014  . CAD (coronary artery disease) 12/25/2013  . Essential hypertension 12/25/2013  . Morbid obesity ([place]) 12/25/2013  . Dyslipidemia 12/11/2013  . Fatigue 10/31/2013  . Screening for lipoid disorders 10/31/2013  . Obstructive apnea 10/31/2013  . Cervical pain 09/26/2013  . Beat, premature ventricular 09/13/2013  . Low back pain 09/10/2013  . 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 DDD (degenerative disc disease), lumbosacral 09/10/2013  . Acid reflux  09/10/2013  . History of colon polyps 09/10/2013  . Lumbar canal stenosis 09/10/2013  . Depression, major, recurrent ([place]) 09/10/2013  . Arthritis, degenerative 09/10/2013  . Atrophic kidney 09/10/2013  . Compressed spine fracture ([place]) 09/10/2013    Past Surgical History:  Procedure Laterality Date  . ANTERIOR CERVICAL DECOMP/DISCECTOMY FUSION  1998  . ANTERIOR CERVICAL DISCECTOMY  1989  . APPENDECTOMY  1967  . BACK SURGERY    . BASAL CELL CARCINOMA EXCISION     "top of my head"  . CARPAL TUNNEL RELEASE Bilateral 1980's  . CATARACT EXTRACTION W/ INTRAOCULAR LENS  IMPLANT, BILATERAL Bilateral 2013  . CHOLECYSTECTOMY    . COLONOSCOPY    . CORONARY ANGIOPLASTY WITH STENT PLACEMENT  ~ 1992  . EYE MUSCLE SURGERY Right 2013  . IR KYPHO LUMBAR INC FX REDUCE BONE BX UNI/BIL CANNULATION INC/IMAGING  01/23/2018  . JOINT REPLACEMENT    . LEFT HEART CATHETERIZATION WITH CORONARY ANGIOGRAM N/A 12/10/2013   Procedure: LEFT HEART CATHETERIZATION WITH CORONARY ANGIOGRAM;  Surgeon: Leonie Man, MD;  Location: [facility] CATH LAB;  Service: Cardiovascular;  Laterality: N/A;  . LUMBAR LAMINECTOMY/DECOMPRESSION MICRODISCECTOMY Bilateral 12/04/2012   Procedure: BILATERAL LUMBAR LAMINECTOMY/DECOMPRESSION MICRODISCECTOMY LUMBAR FOUR-TO FIVE SACRALONE;  Surgeon: Elaina Hoops, MD;  Location: [facility]URO ORS;  Service: Neurosurgery;  Laterality: Bilateral;  . REVISION TOTAL HIP ARTHROPLASTY Right 2002   "changed a ball"  . RIGHT/LEFT HEART CATH AND CORONARY ANGIOGRAPHY N/A 09/19/2018   Procedure: RIGHT/LEFT HEART CATH AND CORONARY ANGIOGRAPHY;  Surgeon: Leonie Man, MD;  Location: [facility] CV LAB;  Service: Cardiovascular;  Laterality: N/A;  . SHOULDER ARTHROSCOPY Bilateral    bone spurs  . TONSILLECTOMY  ~ 1947  . TOTAL HIP ARTHROPLASTY Bilateral 1989-1995   "right-left"  . TOTAL KNEE ARTHROPLASTY Left 09/24/2014  . TOTAL KNEE ARTHROPLASTY Left 09/24/2014   Procedure: TOTAL KNEE ARTHROPLASTY;  Surgeon:  Melrose Nakayama, MD;  Location: [facility];  Service: Orthopedics;  Laterality: Left;       Family History  Problem Relation Age of Onset  . Cancer Mother   . Cancer Brother   . Heart attack Neg Hx   . Stroke Neg Hx   . Hypertension Neg Hx     Social History   Tobacco Use  . Smoking status: Former Smoker    Packs/day: 1.00    Years: 4.00    Pack years: 4.00    Types: Cigarettes    Quit date: 02/16/1960    Years since quitting: 59.9  . Smokeless tobacco: Never Used  Vaping Use  . Vaping Use: Never used  Substance Use Topics  . Alcohol use: No  . Drug use: No    Home Medications Prior to Admission medications   Medication Sig Start Date End Date Taking? Authorizing Provider  acetaminophen (TYLENOL) 500 MG tablet Take 500-1,000 mg by mouth every 6 (six) hours as needed for moderate  pain or headache.     [provider]  albuterol (VENTOLIN HFA) 108 (90 Base) MCG/ACT inhaler Inhale 2 puffs into the lungs every 6 (six) hours as needed for wheezing or shortness of breath.    [provider]  alfuzosin (UROXATRAL) 10 MG 24 hr tablet Take 10 mg by mouth daily with breakfast.    [provider]  apixaban (ELIQUIS) 5 MG TABS tablet Take 5 mg by mouth 2 (two) times daily.    [provider]  aspirin EC 81 MG tablet Take 81 mg by mouth daily. Swallow whole.    [provider]  atorvastatin (LIPITOR) 40 MG tablet Take 40 mg by mouth daily.    [provider]  azelastine (ASTELIN) 0.1 % nasal spray Place 1 spray into both nostrils daily. Use in each nostril as directed    [provider]  Dextromethorphan-Guaifenesin (MUCINEX DM MAXIMUM STRENGTH) 60-1200 MG TB12 Take 1 tablet by mouth daily.     [provider]  EPINEPHrine (EPIPEN 2-PAK) 0.3 mg/0.3 mL IJ SOAJ injection Inject 0.3 mLs (0.3 mg total) into the muscle once. Patient taking differently: Inject 0.3 mg into the muscle daily as needed for anaphylaxis.  11/25/14    Charlies Silvers, MD  finasteride (PROSCAR) 5 MG tablet Take 5 mg by mouth daily.    [provider]  fluticasone (FLONASE) 50 MCG/ACT nasal spray 2 sprays per nostril once a day if needed for stuffy nose use daily until end of june Patient taking differently: Place 2 sprays into both nostrils daily as needed for allergies.  07/02/19   Charlies Silvers, MD  furosemide (LASIX) 20 MG tablet Take 1 tablet (20 mg total) by mouth daily. 3 times weekly as needed 12/16/19   Barb Merino, MD  gabapentin (NEURONTIN) 100 MG capsule Take 100 mg by mouth 2 (two) times daily.    [provider]  ipratropium (ATROVENT) 0.06 % nasal spray Place 2 sprays into both nostrils 2 (two) times a day.  08/15/18   [provider]  isosorbide mononitrate (IMDUR) 30 MG 24 hr tablet Take 1 tablet (30 mg total) by mouth daily. 06/26/19   Richardson Dopp T, PA-C  levocetirizine (XYZAL) 5 MG tablet Take 5 mg by mouth every evening.    [provider]  memantine (NAMENDA) 10 MG tablet Take 10 mg by mouth 2 (two) times daily.    [provider]  metoprolol tartrate (LOPRESSOR) 50 MG tablet Take 1 tablet (50 mg total) by mouth 2 (two) times daily. 12/16/19 01/15/20  Barb Merino, MD  nitroGLYCERIN (NITROSTAT) 0.4 MG SL tablet Place 0.4 mg under the tongue every 5 (five) minutes as needed for chest pain.    [provider]  omeprazole (PRILOSEC) 40 MG capsule Take 40 mg by mouth in the morning and at bedtime.    [provider]  Polyethyl Glycol-Propyl Glycol (SYSTANE) 0.4-0.3 % SOLN Place 1 drop into both eyes 3 (three) times daily as needed (dry/irritated eyes.).    [provider]  triamcinolone cream (KENALOG) 0.1 % Apply 1 application topically 2 (two) times daily.    [provider]  venlafaxine XR (EFFEXOR-XR) 75 MG 24 hr capsule Take 75-150 mg by mouth See admin instructions. 150 mg in the morning, 75 mg at bedtime    [provider]     Allergies    Penicillins, Penicillins, Bee venom, Donepezil, and Methocarbamol  Review of Systems   Review of Systems  All  other systems reviewed and are negative.   Physical Exam Updated Vital Signs BP 115/74   Pulse (!) 118   Resp (!) 26   SpO2 97%   Physical Exam Vitals and nursing note reviewed.  Constitutional:      Appearance: He is well-developed. He is obese. He is ill-appearing.  HENT:     Head: Normocephalic and atraumatic.     Right Ear: External ear normal.     Left Ear: External ear normal.  Eyes:     General: No scleral icterus.       Right eye: No discharge.        Left eye: No discharge.     Conjunctiva/sclera: Conjunctivae normal.  Neck:     Trachea: No tracheal deviation.  Cardiovascular:     Rate and Rhythm: Tachycardia present. Rhythm irregular.  Pulmonary:     [place]: No respiratory distress.     Breath sounds: No stridor. No wheezing.     Comments: Diffuse Rales, tachypnea Abdominal:     General: Bowel sounds are normal. There is no distension.     Palpations: Abdomen is soft.     Tenderness: There is no abdominal tenderness. There is no guarding or rebound.  Musculoskeletal:        General: No tenderness.     Cervical back: Neck supple.     Right lower leg: Edema present.     Left lower leg: Edema present.  Skin:    General: Skin is warm and dry.     Findings: No rash.  Neurological:     Mental Status: He is alert.     Cranial Nerves: No cranial nerve deficit (no facial droop, extraocular movements intact, no slurred speech).     Sensory: No sensory deficit.     Motor: No abnormal muscle tone or seizure activity.     Coordination: Coordination normal.     ED Results / Procedures / Treatments   Labs (all labs ordered are listed, but only abnormal results are displayed) Labs Reviewed  BASIC METABOLIC PANEL - Abnormal; Notable for the following components:      Result Value   Sodium 132 (*)    CO2 21 (*)    Glucose, Bld 157  (*)    All other components within normal limits  CBC - Abnormal; Notable for the following components:   WBC 14.2 (*)    RBC 3.82 (*)    Hemoglobin 12.9 (*)    MCV 105.8 (*)    RDW 17.1 (*)    All other components within normal limits  BRAIN NATRIURETIC PEPTIDE - Abnormal; Notable for the following components:   B Natriuretic Peptide 363.7 (*)    All other components within normal limits  TROPONIN I (HIGH SENSITIVITY) - Abnormal; Notable for the following components:   Troponin I (High Sensitivity) 27 (*)    All other components within normal limits  RESPIRATORY PANEL BY RT PCR (FLU A&B, COVID)  TROPONIN I (HIGH SENSITIVITY)    EKG EKG Interpretation  Date/Time:  Sunday January 06 2020 20:11:16 EST Ventricular Rate:  143 PR Interval:    QRS Duration: 100 QT Interval:  314 QTC Calculation: 484 R Axis:   -36 Text Interpretation: Atrial fibrillation with rapid ventricular response with premature ventricular or aberrantly conducted complexes Left axis deviation Minimal voltage criteria for LVH, may be normal variant ( R in aVL ) Nonspecific ST and T wave abnormality Abnormal ECG Since last tracing rate faster Confirmed by Dorie Rank (  [id number]) on 12/17/2019 8:39:35 PM   Radiology DG Chest Portable 1 View  Result Date: 12/24/2019 CLINICAL DATA:  Dyspnea. EXAM: PORTABLE CHEST 1 VIEW COMPARISON:  12/13/2019 FINDINGS: There is cardiomegaly with vascular congestion and mild interstitial edema. There are bilateral pleural effusions with adjacent airspace disease as before. Pulmonary fibrosis is again noted. There are degenerative changes of both glenohumeral joints. Aortic calcifications are noted. There are apparent left-sided rib fractures which appear to be new from prior study. IMPRESSION: 1. Multiple acute appearing left-sided rib fractures are noted. No pneumothorax. 2. Pulmonary fibrosis again noted. 3. Persistent left-sided airspace disease favored to represent a combination of a  left-sided pleural effusion or hemothorax in combination with atelectasis. An infiltrate is not entirely excluded. Electronically Signed   By: Constance Holster M.D.   On: 01/05/2020 21:45    Procedures .Critical Care Performed by: Dorie Rank, MD Authorized by: Dorie Rank, MD   Critical care provider statement:    Critical care time (minutes):  45   Critical care was time spent personally by me on the following activities:  Discussions with consultants, evaluation of patient's response to treatment, examination of patient, ordering and performing treatments and interventions, ordering and review of laboratory studies, ordering and review of radiographic studies, pulse oximetry, re-evaluation of patient's condition, obtaining history from patient or surrogate and review of old charts   (including critical care time)  Medications Ordered in ED Medications  diltiazem (CARDIZEM) 1 mg/mL load via infusion 15 mg (15 mg Intravenous Bolus from Bag 12/25/2019 2039)    And  diltiazem (CARDIZEM) 125 mg in dextrose 5% 125 mL (1 mg/mL) infusion (12.5 mg/hr Intravenous Rate/Dose Change 12/18/2019 2213)  fentaNYL (SUBLIMAZE) injection 50 mcg (has no administration in time range)  levofloxacin (LEVAQUIN) IVPB 750 mg (has no administration in time range)    ED Course  I have reviewed the triage vital signs and the nursing notes.  Pertinent labs & imaging results that were available during my care of the patient were reviewed by me and considered in my medical decision making (see chart for details).  Clinical Course as of Jan 06 2251  Nancy Fetter Jan 06, 2020  2205 Troponin elevated at 27   [JK]  2206 Chest x-ray does show multiple rib fractures   [JK]  2206 No pneumothorax   [JK]  2206 Hemoglobin stable   [JK]  2206 Questionable pleural effusion noted on left side suggesting possibility of hemothorax however this finding was seen on his chest x-ray performed on October 28   [JK]    Clinical Course User  Index [JK] Dorie Rank, MD   MDM Rules/Calculators/A&P                         Patient presented with persistent weakness, increasing shortness of breath and rib pain after recent fall.  Patient recently admitted to the hospital diagnosed with acute on chronic heart failure, aortic stenosis and atrial fibrillation.  Patient unfortunately is not a candidate for any surgical intervention.  Discussions were made about palliative management.  Patient fell since his discharge from the hospital.  This evening he had worsening shortness of breath.  He was noted to be in recurrent atrial fibrillation with rapid rate.  Patient was started on a Cardizem infusion.  Patient has remained tachycardic.  Chest x-ray does now also show multiple rib fractures.  Patient does have chronic interstitial lung disease.  Patient did have questionable effusion/hemothorax in the left lung.  Prior x-rays however appear to be similar.  It is possible the patient could have a component of pulmonary contusion.  He has had some blood-tinged sputum.  I discussed these findings with the patient and his daughter.  Patient is DNR/DNI.  The family want to try to keep him comfortable.  They are not interested in any surgical interventions.  I have ordered pain medications as well.  I will give a dose of antibiotics as infiltrate is not entirely excluded.  I will consult the medical service for admission.  Also consider palliative care consult.  Final Clinical Impression(s) / ED Diagnoses Final diagnoses:  Atrial fibrillation with rapid ventricular response (HCC)  Closed fracture of multiple ribs of left side, initial encounter  Aortic valve stenosis, etiology of cardiac valve disease unspecified      Dorie Rank, MD 12/28/2019 2252

## 2020-01-07 ENCOUNTER — Inpatient Hospital Stay (HOSPITAL_COMMUNITY): Payer: Medicare Other

## 2020-01-07 DIAGNOSIS — S2249XA Multiple fractures of ribs, unspecified side, initial encounter for closed fracture: Secondary | ICD-10-CM

## 2020-01-07 DIAGNOSIS — I5043 Acute on chronic combined systolic (congestive) and diastolic (congestive) heart failure: Secondary | ICD-10-CM | POA: Diagnosis not present

## 2020-01-07 DIAGNOSIS — R5381 Other malaise: Secondary | ICD-10-CM

## 2020-01-07 DIAGNOSIS — I10 Essential (primary) hypertension: Secondary | ICD-10-CM

## 2020-01-07 DIAGNOSIS — I4811 Longstanding persistent atrial fibrillation: Secondary | ICD-10-CM

## 2020-01-07 DIAGNOSIS — Z7189 Other specified counseling: Secondary | ICD-10-CM

## 2020-01-07 DIAGNOSIS — S2242XA Multiple fractures of ribs, left side, initial encounter for closed fracture: Secondary | ICD-10-CM | POA: Diagnosis not present

## 2020-01-07 DIAGNOSIS — I350 Nonrheumatic aortic (valve) stenosis: Secondary | ICD-10-CM | POA: Diagnosis not present

## 2020-01-07 DIAGNOSIS — I4891 Unspecified atrial fibrillation: Secondary | ICD-10-CM | POA: Diagnosis not present

## 2020-01-07 DIAGNOSIS — G3184 Mild cognitive impairment, so stated: Secondary | ICD-10-CM

## 2020-01-07 DIAGNOSIS — Z515 Encounter for palliative care: Secondary | ICD-10-CM

## 2020-01-07 LAB — CBC
HCT: 37.7 % — ABNORMAL LOW (ref 39.0–52.0)
Hemoglobin: 12.1 g/dL — ABNORMAL LOW (ref 13.0–17.0)
MCH: 33.7 pg (ref 26.0–34.0)
MCHC: 32.1 g/dL (ref 30.0–36.0)
MCV: 105 fL — ABNORMAL HIGH (ref 80.0–100.0)
Platelets: 253 10*3/uL (ref 150–400)
RBC: 3.59 MIL/uL — ABNORMAL LOW (ref 4.22–5.81)
RDW: 17.1 % — ABNORMAL HIGH (ref 11.5–15.5)
WBC: 12.8 10*3/uL — ABNORMAL HIGH (ref 4.0–10.5)
nRBC: 0.2 % (ref 0.0–0.2)

## 2020-01-07 LAB — COMPREHENSIVE METABOLIC PANEL
ALT: 20 U/L (ref 0–44)
AST: 28 U/L (ref 15–41)
Albumin: 2.4 g/dL — ABNORMAL LOW (ref 3.5–5.0)
Alkaline Phosphatase: 65 U/L (ref 38–126)
Anion gap: 12 (ref 5–15)
BUN: 19 mg/dL (ref 8–23)
CO2: 24 mmol/L (ref 22–32)
Calcium: 9.3 mg/dL (ref 8.9–10.3)
Chloride: 99 mmol/L (ref 98–111)
Creatinine, Ser: 1.1 mg/dL (ref 0.61–1.24)
GFR, Estimated: >60 mL/min (ref >60)
Glucose, Bld: 135 mg/dL — ABNORMAL HIGH (ref 70–99)
Potassium: 4 mmol/L (ref 3.5–5.1)
Sodium: 135 mmol/L (ref 135–145)
Total Bilirubin: 1.1 mg/dL (ref 0.3–1.2)
Total Protein: 6.7 g/dL (ref 6.5–8.1)

## 2020-01-07 LAB — TROPONIN I (HIGH SENSITIVITY): Troponin I (High Sensitivity): 34 ng/L — ABNORMAL HIGH (ref <18)

## 2020-01-07 MED ORDER — METOPROLOL TARTRATE 5 MG/5ML IV SOLN
2.5000 mg | Freq: Four times a day (QID) | INTRAVENOUS | Status: DC
  Administered 2020-01-07 – 2020-01-08 (×3): 2.5 mg via INTRAVENOUS
  Filled 2020-01-07 (×4): qty 5

## 2020-01-07 MED ORDER — FUROSEMIDE 10 MG/ML IJ SOLN
20.0000 mg | Freq: Once | INTRAMUSCULAR | Status: AC
  Administered 2020-01-07: 20 mg via INTRAVENOUS
  Filled 2020-01-07: qty 2

## 2020-01-07 MED ORDER — HYDROMORPHONE HCL 1 MG/ML IJ SOLN
0.5000 mg | INTRAMUSCULAR | Status: DC | PRN
  Administered 2020-01-07 (×5): 0.5 mg via INTRAVENOUS
  Filled 2020-01-07: qty 0.5
  Filled 2020-01-07 (×3): qty 1
  Filled 2020-01-07 (×2): qty 0.5
  Filled 2020-01-07: qty 1

## 2020-01-07 MED ORDER — BISACODYL 10 MG RE SUPP
10.0000 mg | Freq: Once | RECTAL | Status: AC
  Administered 2020-01-07: 10 mg via RECTAL
  Filled 2020-01-07: qty 1

## 2020-01-07 MED ORDER — FUROSEMIDE 10 MG/ML IJ SOLN
40.0000 mg | Freq: Every day | INTRAMUSCULAR | Status: DC
  Administered 2020-01-08 – 2020-01-10 (×3): 40 mg via INTRAVENOUS
  Filled 2020-01-07 (×3): qty 4

## 2020-01-07 NOTE — Progress Notes (Signed)
PROGRESS NOTE    KHASIR WOODROME  LEX:[id number] DOB: Jul 16, 1932 DOA: 01/08/2020 PCP: Lilian Coma., MD    Brief Narrative:  Larry STRAUGHTER is a 86 y.o. male with medical history significant of GERD, combined systolic and diastolic heart failure, severe aortic stenosis, BPH, CAD, degenerative disc disease, depression, hyperlipidemia, hypertension, atrial fibrillation, interstitial lung disease, OSA on CPAP, memory deficits who presents after EMS came out to see patient for a fall and was found to be tachycardic and hypoxic  11/22- on admission with afib rvr, started on cardizem gtt. Refused po meds earlier, discussed this with his daughter at bedside. Daughter stated pt does have dementia and at times is confused   Consultants:   Cardiology, palliative care  Procedures:   Antimicrobials:      Subjective: Pt interactive at times. Cant tell me if sob better.   Objective: Vitals:   01/07/20 1400 01/07/20 1415 01/07/20 1430 01/07/20 1530  BP: 115/86 104/86 (!) 138/96 (!) 136/98  Pulse: (!) 109 (!) 128 (!) 123 (!) 129  Resp: (!) 27 (!) 25 (!) 27 (!) 32  Temp:      TempSrc:      SpO2: 97% 96% 95% 92%    Intake/Output Summary (Last 24 hours) at 01/07/2020 1651 Last data filed at 01/07/2020 0640 Gross per 24 hour  Intake 8 ml  Output 1000 ml  Net -992 ml   There were no vitals filed for this visit.  Examination:  General exam: Appears calm and comfortable , at times confused, but now better as we talk Respiratory system: bibasilar crackles, no wheezing Cardiovascular system: S1 & S2 heard, reg-irreg, tachy  no gallop Gastrointestinal system: Abdomen is nondistended, soft and nontender.  Normal bowel sounds heard. Central nervous system: Alert and oriented x2, not to date. grossly intact Extremities: +edema b/l Skin: warm, dry Psychiatry: . Mood & affect appropriate in current setting.     Data Reviewed: I have personally reviewed following labs and imaging  studies  CBC: Recent Labs  Lab 01/02/2020 2012 01/07/20 0551  WBC 14.2* 12.8*  HGB 12.9* 12.1*  HCT 40.4 37.7*  MCV 105.8* 105.0*  PLT 276 449   Basic Metabolic Panel: Recent Labs  Lab 01/09/2020 2012 01/07/20 0551  NA 132* 135  K 4.5 4.0  CL 99 99  CO2 21* 24  GLUCOSE 157* 135*  BUN 20 19  CREATININE 1.13 1.10  CALCIUM 9.4 9.3   GFR: CrCl cannot be calculated (Unknown ideal weight.). Liver Function Tests: Recent Labs  Lab 01/07/20 0551  AST 28  ALT 20  ALKPHOS 65  BILITOT 1.1  PROT 6.7  ALBUMIN 2.4*   No results for input(s): LIPASE, AMYLASE in the last 168 hours. No results for input(s): AMMONIA in the last 168 hours. Coagulation Profile: No results for input(s): INR, PROTIME in the last 168 hours. Cardiac Enzymes: No results for input(s): CKTOTAL, CKMB, CKMBINDEX, TROPONINI in the last 168 hours. BNP (last 3 results) No results for input(s): PROBNP in the last 8760 hours. HbA1C: No results for input(s): HGBA1C in the last 72 hours. CBG: No results for input(s): GLUCAP in the last 168 hours. Lipid Profile: No results for input(s): CHOL, HDL, LDLCALC, TRIG, CHOLHDL, LDLDIRECT in the last 72 hours. Thyroid Function Tests: No results for input(s): TSH, T4TOTAL, FREET4, T3FREE, THYROIDAB in the last 72 hours. Anemia Panel: No results for input(s): VITAMINB12, FOLATE, FERRITIN, TIBC, IRON, RETICCTPCT in the last 72 hours. Sepsis Labs: No results for input(s): PROCALCITON,  LATICACIDVEN in the last 168 hours.  Recent Results (from the past 240 hour(s))  Respiratory Panel by RT PCR (Flu A&B, Covid) - Nasopharyngeal Swab     Status: None   Collection Time: 01/14/2020  9:10 PM   Specimen: Nasopharyngeal Swab; Nasopharyngeal(NP) swabs in vial transport medium  Result Value Ref Range Status   SARS Coronavirus 2 by RT PCR NEGATIVE NEGATIVE Final    Comment: (NOTE) SARS-CoV-2 target nucleic acids are NOT DETECTED.  The SARS-CoV-2 RNA is generally detectable in  upper respiratoy specimens during the acute phase of infection. The lowest concentration of SARS-CoV-2 viral copies this assay can detect is 131 copies/mL. A negative result does not preclude SARS-Cov-2 infection and should not be used as the sole basis for treatment or other patient management decisions. A negative result may occur with  improper specimen collection/handling, submission of specimen other than nasopharyngeal swab, presence of viral mutation(s) within the areas targeted by this assay, and inadequate number of viral copies (<131 copies/mL). A negative result must be combined with clinical observations, patient history, and epidemiological information. The expected result is Negative.  Fact Sheet for Patients:  PinkCheek.be  Fact Sheet for Healthcare Providers:  GravelBags.it  This test is no t yet approved or cleared by the [place] FDA and  has been authorized for detection and/or diagnosis of SARS-CoV-2 by FDA under an Emergency Use Authorization (EUA). This EUA will remain  in effect (meaning this test can be used) for the duration of the COVID-19 declaration under Section 564(b)(1) of the Act, 21 U.S.C. section 360bbb-3(b)(1), unless the authorization is terminated or revoked sooner.     Influenza A by PCR NEGATIVE NEGATIVE Final   Influenza B by PCR NEGATIVE NEGATIVE Final    Comment: (NOTE) The Xpert Xpress SARS-CoV-2/FLU/RSV assay is intended as an aid in  the diagnosis of influenza from Nasopharyngeal swab specimens and  should not be used as a sole basis for treatment. Nasal washings and  aspirates are unacceptable for Xpert Xpress SARS-CoV-2/FLU/RSV  testing.  Fact Sheet for Patients: PinkCheek.be  Fact Sheet for Healthcare Providers: GravelBags.it  This test is not yet approved or cleared by the [place] FDA and  has been  authorized for detection and/or diagnosis of SARS-CoV-2 by  FDA under an Emergency Use Authorization (EUA). This EUA will remain  in effect (meaning this test can be used) for the duration of the  Covid-19 declaration under Section 564(b)(1) of the Act, 21  U.S.C. section 360bbb-3(b)(1), unless the authorization is  terminated or revoked. Performed at [facility], [place] [address] [place], [place] [address]          Radiology Studies: DG Chest Portable 1 View  Result Date: 01/07/2020 CLINICAL DATA:  Decreased breath sounds and chest pain. EXAM: PORTABLE CHEST 1 VIEW COMPARISON:  12/25/2019 FINDINGS: Cardiac enlargement with pulmonary vascular congestion. Interstitial pattern suggests edema. Probable small left pleural effusion. No pneumothorax. Degenerative changes in the spine and shoulders. Old left rib fractures. Calcification of the aorta. Postoperative change in the cervical spine. IMPRESSION: Cardiac enlargement with pulmonary vascular congestion and interstitial edema. Probable small left pleural effusion. Similar appearance to previous study. Electronically Signed   By: Lucienne Capers M.D.   On: 01/07/2020 01:39   DG Chest Portable 1 View  Result Date: 12/20/2019 CLINICAL DATA:  Dyspnea. EXAM: PORTABLE CHEST 1 VIEW COMPARISON:  12/13/2019 FINDINGS: There is cardiomegaly with vascular congestion and mild interstitial edema. There are bilateral pleural effusions with adjacent  airspace disease as before. Pulmonary fibrosis is again noted. There are degenerative changes of both glenohumeral joints. Aortic calcifications are noted. There are apparent left-sided rib fractures which appear to be new from prior study. IMPRESSION: 1. Multiple acute appearing left-sided rib fractures are noted. No pneumothorax. 2. Pulmonary fibrosis again noted. 3. Persistent left-sided airspace disease favored to represent a combination of a left-sided pleural effusion or hemothorax in combination with  atelectasis. An infiltrate is not entirely excluded. Electronically Signed   By: Constance Holster M.D.   On: 01/04/2020 21:45        Scheduled Meds: . alfuzosin  10 mg Oral Q breakfast  . apixaban  5 mg Oral BID  . aspirin EC  81 mg Oral Daily  . atorvastatin  40 mg Oral Daily  . finasteride  5 mg Oral Daily  . furosemide  20 mg Oral Daily  . gabapentin  100 mg Oral TID  . ipratropium  2 spray Each Nare BID  . isosorbide mononitrate  30 mg Oral Daily  . memantine  10 mg Oral BID  . metoprolol tartrate  50 mg Oral BID  . pantoprazole  40 mg Oral Daily  . sodium chloride flush  3 mL Intravenous Q12H  . venlafaxine XR  150 mg Oral Daily   And  . venlafaxine XR  75 mg Oral QHS   Continuous Infusions: . diltiazem (CARDIZEM) infusion 15 mg/hr (01/07/20 1418)    Assessment & Plan:   Principal Problem:   Atrial fibrillation with RVR ([place]) Active Problems:   Essential hypertension   Morbid obesity (HCC)   Traumatic subarachnoid hemorrhage (HCC)   Obstructive sleep apnea treated with bilevel positive airway pressure (BiPAP)   Gastroesophageal reflux disease without esophagitis   Longstanding persistent atrial fibrillation (HCC)   Mild cognitive impairment   Aortic stenosis, severe   Acute on chronic combined systolic and diastolic CHF (congestive heart failure) (HCC)   Physical deconditioning   Multiple rib fractures   S/p Fall- with Left rib fractures Per family has had multiple falls in the past He is deconditioned    Acute on chronic respiratory failure/AS/ILD Found by EMS to be hypoxic and tachycardic.  With leg swelling Likely multifactorial-AMS, ILD, found with A. fib RVR BMP elevated probably underestimated due to his weight Continue rate control Keep 02>92% with o2 support Patient has severe low-flow gradient AMS and moderate MS-cardiology is not a good surgical/TAVR candidate given her underlying lung disease Discussed with daughter about palliative care  and possible hospice, she is agreeable to discuss this with them Started on lasix iv by cardiology Monitor I/o Daily weight   Atrial fibrillation with RVR On cardizem gtt, pt refused po med  Rate not well controlled Cardiology consulted, not good candidate for amiodarone given ILD. Started on iv beta blk D/w daughter about risk v.s. benefit of a/c, as concern pt has had multiple falls, she is ok with stopping Eliquis if we need to as he is in risk with his falls.    CAD Hypertension  Hyperlipidemia Acute on chronic Combined systolic and diastolic heart failure Volume overloaded on exam, likely due to AS, and afib rvr. Or afib rvr occurred causing fluid overload EF 40-50% on echo in oct. bnp elevated Started on lasix iv Rate control as above  BPH -Continue alfuzosin and finasteride   OSA on CPAP cpap at night/sleep  Dementia - Family reportedly has power of attorney, will need to confirm Per daughter he has dementia Continue with  mematine Minimize sedation Monitor for delirium as he is hospitalized  DVT prophylaxis: Eliquis Code Status:DNR Family Communication: daughter at bedside  Status is: Inpatient  Remains inpatient appropriate because:IV treatments appropriate due to intensity of illness or inability to take PO   Dispo: The patient is from: Home              Anticipated d/c is to: TBD              Anticipated d/c date is: 3 days              Patient currently is not medically stable to d/c.            LOS: 1 day   Time spent: 45 min with >50% on coc    Nolberto Hanlon, MD [facility] Pager [phone]  If 7PM-7AM, please contact night-coverage [email or web address] Password [facility] 01/07/2020, 4:51 PM

## 2020-01-07 NOTE — ED Notes (Signed)
Pt unable to swallow pills at this time.

## 2020-01-07 NOTE — ED Notes (Signed)
Notified Dr. Tomi Bamberger of pt c/o spitting up blood. Dr. Tomi Bamberger came to bedside to assess patient.

## 2020-01-07 NOTE — Progress Notes (Signed)
[facility] ([facility]) Community Based Palliative [facility]   °    °This patient is enrolled in our palliative care services in the community.  [facility] will continue to follow for any discharge planning needs and to coordinate continuation of palliative care.   °If you have questions or need assistance, please call [phone] or contact the hospital Liaison listed on AMION.    ° °Thank you for the opportunity to participate in this patient’s care. °    °Chrislyn King, BSN, RN °[facility] Hospital Liaison   °[phone] (24h on call) ° °

## 2020-01-07 NOTE — Consult Note (Addendum)
Cardiology Consultation:   Patient ID: Larry Church MRN: [id number]; DOB: [birth year]  Admit date: 01/12/2020 Date of Consult: 01/07/2020  Primary Care Provider: Lilian Coma., MD [facility] Cardiologist: Dorris Carnes, MD  [place] Electrophysiologist:  None    Patient Profile:   Larry Church is a 86 y.o. male with a hx of CAD, moderate ischemic cardiomyopathy, persistent atrial fibrillation, history of subarachnoid bleed, aortic stenosis (deemed not a surgical candidate), remote tobacco use, ILD, hypertension and hyperlipidemia who is being seen today for the evaluation of A. fib RVR at the request of Dr. Kurtis Bushman.  History of Present Illness:   Larry Church followed by Dr. Harrington Challenger for the above cardiac issues.  He has a past medical history of CAD cardiac cath in 2015 showing nonobstructive disease.  Echo in October 2019 showed EF 55 to 60%, mild to moderate AI.  Echo obtained at an outside facility in 09/04/2018 showed EF 40 to 45% with severe AS.  Last cardiac catheterization was 09/19/2018 showing 85% ramus lesion, moderate disease in the LAD that was medically managed.  Also showed moderate AS.  History of A. fib and he was previously not on anticoagulation due to frequent falls and history of subarachnoid bleed however he was started on Eliquis 09/17/2019.  He is on metoprolol 12.5 twice daily for rate control.  Patient was last seen in October 2021 during an admission for dyspnea.  High resolution chest CT confirmed presence of ILD with probable UIP.  Echo showed severe low flow low gradient AS, moderate MS with mild LV dysfunction.  Patient deemed not a good surgical candidate given underlying lung disease.  Lopressor was increased for better A. fib control.  Palliative care was ultimately consulted.  The patient presented to the ED 01/06/2019 for shortness of breath.  Patient had a mechanical fall the other day and was having pain on the left side of his rib and difficulty getting  around. Originally the family called EMS to get help with moving the patient. Denied chest pain, fever, chills. Has lower leg edema however has been unchanged. He was sob but this was thought to be due to his rib pain. EMS noted to be hypoxic and tachycardic, started on nasal cannula and brought to the ER for further evaluation.  In the ER BP was stable, he was afebrile and tachypneic. EKG showed A. fib RVR with rates up to 140s.  Chest x-ray showed left rib fractures and fibrosis with possible left effusion versus hemothorax versus pneumonia.  Labs showed mild leukocytosis with WBC of 14.  Hemoglobin 12.9, mild hyponatremia 132.  BNP 363.  Respiratory panel negative. HS troponin 20>20>27>34.  He was started on IV dill drip and given IV antibiotics and admitted for further work-up.  Past Medical History:  Diagnosis Date  . Anxiety   . Arthritis    "bad; all over" (09/24/2014)  . Asthma   . Basal cell carcinoma    "cut and burned off  top of head and face"  . Chronic bronchitis ([place])    "got it q yr til this year" (09/24/2014)  . Chronic lower back pain   . Chronic sinusitis   . Coronary artery disease    LHC 10/15: pLAD 20-30, dLAD 50, origin D1 40; oLCx 40 then irregs; oRI 20, RCA diff irregs up to 10-20  . Degenerative arthritis   . Depression   . Echocardiograms    Echo 10/19: EF 55-60, no RWMA, mild AS (mean 14, peak  26), mild to mod AI, ascending aorta 40 mm, MAC, mild LAE, mild reduced RVSF, mild TR, PASP 43  . GERD (gastroesophageal reflux disease)   . Hypercholesterolemia   . Leg pain    ABIs 11/17: normal  . LV dysfunction    Echo 10/15: Mod LVH, EF 40-50, no apical clot, aortic sclerosis, MAC, trivial MR, mild LAE  . Nuclear stress test    Nuclear stress test 10/19: EF 46, no ischemia; intermediate risk  . OSA on CPAP   . PVC's (premature ventricular contractions)    "I inherited a 4th skip from my mother"  . Seasonal allergies     Past Surgical History:  Procedure  Laterality Date  . ANTERIOR CERVICAL DECOMP/DISCECTOMY FUSION  1998  . ANTERIOR CERVICAL DISCECTOMY  1989  . APPENDECTOMY  1967  . BACK SURGERY    . BASAL CELL CARCINOMA EXCISION     "top of my head"  . CARPAL TUNNEL RELEASE Bilateral 1980's  . CATARACT EXTRACTION W/ INTRAOCULAR LENS  IMPLANT, BILATERAL Bilateral 2013  . CHOLECYSTECTOMY    . COLONOSCOPY    . CORONARY ANGIOPLASTY WITH STENT PLACEMENT  ~ 1992  . EYE MUSCLE SURGERY Right 2013  . IR KYPHO LUMBAR INC FX REDUCE BONE BX UNI/BIL CANNULATION INC/IMAGING  01/23/2018  . JOINT REPLACEMENT    . LEFT HEART CATHETERIZATION WITH CORONARY ANGIOGRAM N/A 12/10/2013   Procedure: LEFT HEART CATHETERIZATION WITH CORONARY ANGIOGRAM;  Surgeon: Leonie Man, MD;  Location: [facility] CATH LAB;  Service: Cardiovascular;  Laterality: N/A;  . LUMBAR LAMINECTOMY/DECOMPRESSION MICRODISCECTOMY Bilateral 12/04/2012   Procedure: BILATERAL LUMBAR LAMINECTOMY/DECOMPRESSION MICRODISCECTOMY LUMBAR FOUR-TO FIVE SACRALONE;  Surgeon: Elaina Hoops, MD;  Location: [place] NEURO ORS;  Service: Neurosurgery;  Laterality: Bilateral;  . REVISION TOTAL HIP ARTHROPLASTY Right 2002   "changed a ball"  . RIGHT/LEFT HEART CATH AND CORONARY ANGIOGRAPHY N/A 09/19/2018   Procedure: RIGHT/LEFT HEART CATH AND CORONARY ANGIOGRAPHY;  Surgeon: Leonie Man, MD;  Location: [place] CV LAB;  Service: Cardiovascular;  Laterality: N/A;  . SHOULDER ARTHROSCOPY Bilateral    bone spurs  . TONSILLECTOMY  ~ 1947  . TOTAL HIP ARTHROPLASTY Bilateral 1989-1995   "right-left"  . TOTAL KNEE ARTHROPLASTY Left 09/24/2014  . TOTAL KNEE ARTHROPLASTY Left 09/24/2014   Procedure: TOTAL KNEE ARTHROPLASTY;  Surgeon: Melrose Nakayama, MD;  Location: [place];  Service: Orthopedics;  Laterality: Left;     Home Medications:  Prior to Admission medications   Medication Sig Start Date End Date Taking? Authorizing Provider  acetaminophen (TYLENOL) 500 MG tablet Take 500-1,000 mg by mouth every 6 (six) hours as  needed for moderate pain or headache.    Yes [provider]  albuterol (VENTOLIN HFA) 108 (90 Base) MCG/ACT inhaler Inhale 2 puffs into the lungs every 6 (six) hours as needed for wheezing or shortness of breath.   Yes [provider]  alfuzosin (UROXATRAL) 10 MG 24 hr tablet Take 10 mg by mouth daily with breakfast.   Yes [provider]  apixaban (ELIQUIS) 5 MG TABS tablet Take 5 mg by mouth 2 (two) times daily.   Yes [provider]  aspirin EC 81 MG tablet Take 81 mg by mouth daily. Swallow whole.   Yes [provider]  atorvastatin (LIPITOR) 40 MG tablet Take 40 mg by mouth daily.   Yes [provider]  azelastine (ASTELIN) 0.1 % nasal spray Place 1 spray into both nostrils daily. Use in each nostril as directed   Yes [provider]  ciprofloxacin (CIPRO) 500 MG tablet Take 500 mg by mouth 2 (two) times daily. 12/31/19  Yes [provider]  Dextromethorphan-Guaifenesin (MUCINEX DM MAXIMUM STRENGTH) 60-1200 MG TB12 Take 1 tablet by mouth daily.    Yes [provider]  EPINEPHrine (EPIPEN 2-PAK) 0.3 mg/0.3 mL IJ SOAJ injection Inject 0.3 mLs (0.3 mg total) into the muscle once. Patient taking differently: Inject 0.3 mg into the muscle daily as needed for anaphylaxis.  11/25/14  Yes Bardelas, Jens Som, MD  finasteride (PROSCAR) 5 MG tablet Take 5 mg by mouth daily.   Yes [provider]  fluticasone (FLONASE) 50 MCG/ACT nasal spray 2 sprays per nostril once a day if needed for stuffy nose use daily until end of june Patient taking differently: Place 2 sprays into both nostrils daily as needed for allergies.  07/02/19  Yes Bardelas, Jens Som, MD  furosemide (LASIX) 20 MG tablet Take 1 tablet (20 mg total) by mouth daily. 3 times weekly as needed Patient taking differently: Take 20 mg by mouth daily.  12/16/19  Yes Barb Merino, MD  gabapentin (NEURONTIN) 100 MG capsule Take 100 mg by mouth 2 (two) times daily.    Yes [provider]  ipratropium (ATROVENT) 0.06 % nasal spray Place 2 sprays into both nostrils 2 (two) times a day.  08/15/18  Yes [provider]  isosorbide mononitrate (IMDUR) 30 MG 24 hr tablet Take 1 tablet (30 mg total) by mouth daily. 06/26/19  Yes Weaver, Scott T, PA-C  levocetirizine (XYZAL) 5 MG tablet Take 5 mg by mouth every evening.   Yes [provider]  memantine (NAMENDA) 10 MG tablet Take 10 mg by mouth 2 (two) times daily.   Yes [provider]  metoprolol tartrate (LOPRESSOR) 50 MG tablet Take 1 tablet (50 mg total) by mouth 2 (two) times daily. 12/16/19 01/15/20 Yes Ghimire, Dante Gang, MD  nitroGLYCERIN (NITROSTAT) 0.4 MG SL tablet Place 0.4 mg under the tongue every 5 (five) minutes as needed for chest pain.   Yes [provider]  omeprazole (PRILOSEC) 40 MG capsule Take 40 mg by mouth in the morning and at bedtime.   Yes [provider]  Polyethyl Glycol-Propyl Glycol (SYSTANE) 0.4-0.3 % SOLN Place 1 drop into both eyes 3 (three) times daily as needed (dry/irritated eyes.).   Yes [provider]  venlafaxine XR (EFFEXOR-XR) 75 MG 24 hr capsule Take 75-150 mg by mouth See admin instructions. 150 mg in the morning, 75 mg at bedtime   Yes [provider]    Inpatient Medications: Scheduled Meds: . alfuzosin  10 mg Oral Q breakfast  . apixaban  5 mg Oral BID  . aspirin EC  81 mg Oral Daily  . atorvastatin  40 mg Oral Daily  . finasteride  5 mg Oral Daily  . furosemide  20 mg Oral Daily  . gabapentin  100 mg Oral TID  . ipratropium  2 spray Each Nare BID  . isosorbide mononitrate  30 mg Oral Daily  . memantine  10 mg Oral BID  . metoprolol tartrate  50 mg Oral BID  . pantoprazole  40 mg Oral Daily  . sodium chloride flush  3 mL Intravenous Q12H  . venlafaxine XR  150 mg Oral Daily   And  . venlafaxine XR  75 mg Oral QHS   Continuous Infusions: . diltiazem (CARDIZEM) infusion 15 mg/hr (01/07/20 1418)     PRN Meds: acetaminophen **OR** acetaminophen, albuterol, HYDROmorphone (DILAUDID) injection, polyethylene  glycol, polyvinyl alcohol  Allergies:    Allergies  Allergen Reactions  . Penicillins Anaphylaxis and Swelling    Has patient had a PCN reaction causing immediate rash, facial/tongue/throat swelling, SOB or lightheadedness with hypotension: Yes Has patient had a PCN reaction causing severe rash involving mucus membranes or skin necrosis: No Has patient had a PCN reaction that required hospitalization: No Has patient had a PCN reaction occurring within the last 10 years: No If all of the above answers are "NO", then may proceed with Cephalosporin use.  Marland Kitchen Penicillins Anaphylaxis  . Bee Venom Swelling  . Donepezil Other (See Comments)    Vertigo  . Methocarbamol     Tiredness    Social History:   Social History   Socioeconomic History  . Marital status: Married    Spouse name: Dixon Boos  . Number of children: 3  . Years of education: Not on file  . Highest education level: Not on file  Occupational History  . Not on file  Tobacco Use  . Smoking status: Former Smoker    Packs/day: 1.00    Years: 4.00    Pack years: 4.00    Types: Cigarettes    Quit date: 02/16/1960    Years since quitting: 59.9  . Smokeless tobacco: Never Used  Vaping Use  . Vaping Use: Never used  Substance and Sexual Activity  . Alcohol use: No  . Drug use: No  . Sexual activity: Not Currently  Other Topics Concern  . Not on file  Social History Narrative  . Not on file   Social Determinants of Health   Financial Resource Strain:   . Difficulty of Paying Living Expenses: Not on file  Food Insecurity:   . Worried About Charity fundraiser in the Last Year: Not on file  . Ran Out of Food in the Last Year: Not on file  Transportation Needs:   . Lack of Transportation (Medical): Not on file  . Lack of Transportation (Non-Medical): Not on file  Physical Activity:   . Days of Exercise per  Week: Not on file  . Minutes of Exercise per Session: Not on file  Stress:   . Feeling of Stress : Not on file  Social Connections:   . Frequency of Communication with Friends and Family: Not on file  . Frequency of Social Gatherings with Friends and Family: Not on file  . Attends Religious Services: Not on file  . Active Member of Clubs or Organizations: Not on file  . Attends Archivist Meetings: Not on file  . Marital Status: Not on file  Intimate Partner Violence:   . Fear of Current or Ex-Partner: Not on file  . Emotionally Abused: Not on file  . Physically Abused: Not on file  . Sexually Abused: Not on file    Family History:   Family History  Problem Relation Age of Onset  . Cancer Mother   . Cancer Brother   . Heart attack Neg Hx   . Stroke Neg Hx   . Hypertension Neg Hx      ROS:  Please see the history of present illness.  All other ROS reviewed and negative.     Physical Exam/Data:   Vitals:   01/07/20 1300 01/07/20 1400 01/07/20 1415 01/07/20 1430  BP: (!) 142/91 115/86 104/86 (!) 138/96  Pulse: (!) 121 (!) 109 (!) 128 (!) 123  Resp: (!) 32 (!) 27 (!) 25 (!) 27  Temp:  TempSrc:      SpO2: 94% 97% 96% 95%    Intake/Output Summary (Last 24 hours) at 01/07/2020 1523 Last data filed at 01/07/2020 0640 Gross per 24 hour  Intake 8 ml  Output 1000 ml  Net -992 ml   Last 3 Weights 12/16/2019 12/15/2019 12/13/2019  Weight (lbs) 235 lb 7.2 oz 238 lb 1.6 oz 240 lb 1.3 oz  Weight (kg) 106.8 kg 108 kg 108.9 kg     There is no height or weight on file to calculate BMI.  General:  Well nourished, well developed, in no acute distress (to my exam, he was minimally responsive.  No obvious distress, but clearly is somewhat confused) HEENT: normal Lymph: no adenopathy Neck: ++ JVD Endocrine:  No thryomegaly Vascular: No carotid bruits; FA pulses 2+ bilaterally without bruits  Cardiac:  normal S1, S2; Irreg Irreg; 3/6 SEM at RUSB--carotids. Lungs:   Diminished at bases with coarse breath sounds Abd: soft, nontender, no hepatomegaly  Ext: 1-2+ lower leg edema Musculoskeletal:  No deformities, BUE and BLE strength normal and equal Skin: warm and dry  Neuro:  CNs 2-12 intact, no focal abnormalities noted Psych:  Normal affect   EKG:  The EKG was personally reviewed and demonstrates:  Afib RVR, 143 bpm Telemetry:  Telemetry was personally reviewed and demonstrates:  Afib HR 120s, frequent PVCs -by the time I saw him when he got up stairs, he was hemodynamically stable with heart rates in the high 90s to low 100s.  Relevant CV Studies:  Echo 12/14/19 1. There is severe aortic stenosis. V max 2.1 m/s, MG 10 mmHG, AVA 0.87  cm2, DI 0.25. This is low-flow low-gradient AS in the setting of LV  dysfunction. SVI=15 cc/m2. Consider aortic valve calcium score for  clarification. The aortic valve is tricuspid.  Aortic valve regurgitation is trivial. Severe aortic valve stenosis.  2. Moderate to severe calcific mitral stenosis. MG 2.9 mmHG with MVA 1.19  cm2 by VTI. Gradient also lower than expected due to low flow state  (SVI=15 cc/m2). The mitral valve is degenerative. Trivial mitral valve  regurgitation. Moderate to severe mitral  stenosis. Moderate to severe mitral annular calcification.  3. Left ventricular ejection fraction, by estimation, is 40 to 45%. The  left ventricle has mildly decreased function. The left ventricle  demonstrates global hypokinesis. There is mild concentric left ventricular  hypertrophy. Left ventricular diastolic  function could not be evaluated.  4. Right ventricular systolic function is mildly reduced. The right  ventricular size is normal. There is normal pulmonary artery systolic  pressure. The estimated right ventricular systolic pressure is 16.1 mmHg.  5. Left atrial size was mildly dilated.  6. The inferior vena cava is normal in size with greater than 50%  respiratory variability, suggesting right  atrial pressure of 3 mmHg.   Comparison(s): Prior images unable to be directly viewed, comparison made  by report only. Changes from prior study are noted. EF 40-45%. Low-flow  low-gradient severe aortic stenosis is present. Moderate to severe  calcific mitral stenosis is present.   R/L Heart Cath [id number]/9604  LV end diastolic pressure is normal. Right heart cath pressures are normal  There is moderate left ventricular systolic dysfunction. Based on cardiac output-index (Fick)  Ramus lesion is 85% stenosed. Calcified, ostial lesion  Mid LAD lesion is 50% stenosed. Dist LAD lesion is 65% stenosed. -Calcified lesions.  Moderate aortic stenosis   SUMMARY  Severe single-vessel disease involving calcified, ostial RAMUS INTERMEDIUS 85% as well as  moderate lesions in the proximal and mid LAD that are heavily calcified -> neither lesion is febrile for PCI given location and calcification  Relatively normal RIGHT HEART CATH PRESSURES and LV EDP.  Moderate aortic valve stenosis with mean gradient of 10 mmHg.  Moderately reduced Cardiac function with a CARDIAC OUTPUT 4.4, CARDIAC INDEX 2.04   RECOMMENDATION  Discharge home after bedrest  For now would opt for medical management of the RAMUS INTERMEDIUS lesion (would not be amenable for stent placement, would only be amenable for PTCA which given the extent of calcification and proximity to large LAD and circumflex make it less than favorable.  We will add Imdur 30 mg daily  May consider adding low-dose diuretic, and converting to long-acting Toprol from metoprolol tartrate given reduced cardiac output  Provided symptoms can be controlled medically, should not adversely affect hopes for shoulder surgery. FOR FUTURE CARDIAC CATHETERIZATION, WOULD NOT USE RADIAL ACCESS.  Coronary Diagrams  Diagnostic Dominance: Right     Heart monitor Long term 08/2018 Atrial fibrillation  53 to 169 bpm  Average HR 99 bpm Frequent PVCs (10%  total), occasional couplet  Longest NSVT 5 beats    Laboratory Data:  High Sensitivity Troponin:   Recent Labs  Lab 12/13/19 1215 12/13/19 1507 01/09/2020 2012 01/07/20 0040  TROPONINIHS 20* 20* 27* 34*     Chemistry Recent Labs  Lab 01/08/2020 2012 01/07/20 0551  NA 132* 135  K 4.5 4.0  CL 99 99  CO2 21* 24  GLUCOSE 157* 135*  BUN 20 19  CREATININE 1.13 1.10  CALCIUM 9.4 9.3  GFRNONAA >60 >60  ANIONGAP 12 12    Recent Labs  Lab 01/07/20 0551  PROT 6.7  ALBUMIN 2.4*  AST 28  ALT 20  ALKPHOS 65  BILITOT 1.1   Hematology Recent Labs  Lab 12/20/2019 2012 01/07/20 0551  WBC 14.2* 12.8*  RBC 3.82* 3.59*  HGB 12.9* 12.1*  HCT 40.4 37.7*  MCV 105.8* 105.0*  MCH 33.8 33.7  MCHC 31.9 32.1  RDW 17.1* 17.1*  PLT 276 253   BNP Recent Labs  Lab 01/05/2020 2012  BNP 363.7*    DDimer No results for input(s): DDIMER in the last 168 hours.   Radiology/Studies:  DG Chest Portable 1 View  Result Date: 01/07/2020 CLINICAL DATA:  Decreased breath sounds and chest pain. EXAM: PORTABLE CHEST 1 VIEW COMPARISON:  01/08/2020 FINDINGS: Cardiac enlargement with pulmonary vascular congestion. Interstitial pattern suggests edema. Probable small left pleural effusion. No pneumothorax. Degenerative changes in the spine and shoulders. Old left rib fractures. Calcification of the aorta. Postoperative change in the cervical spine. IMPRESSION: Cardiac enlargement with pulmonary vascular congestion and interstitial edema. Probable small left pleural effusion. Similar appearance to previous study. Electronically Signed   By: Lucienne Capers M.D.   On: 01/07/2020 01:39   DG Chest Portable 1 View  Result Date: 12/20/2019 CLINICAL DATA:  Dyspnea. EXAM: PORTABLE CHEST 1 VIEW COMPARISON:  12/13/2019 FINDINGS: There is cardiomegaly with vascular congestion and mild interstitial edema. There are bilateral pleural effusions with adjacent airspace disease as before. Pulmonary fibrosis is again  noted. There are degenerative changes of both glenohumeral joints. Aortic calcifications are noted. There are apparent left-sided rib fractures which appear to be new from prior study. IMPRESSION: 1. Multiple acute appearing left-sided rib fractures are noted. No pneumothorax. 2. Pulmonary fibrosis again noted. 3. Persistent left-sided airspace disease favored to represent a combination of a left-sided pleural effusion or hemothorax in combination with atelectasis. An  infiltrate is not entirely excluded. Electronically Signed   By: Constance Holster M.D.   On: 01/01/2020 21:45     Assessment and Plan:   Acute on chronic respiratory failure/ILD -this is multifactorial given severe AS, interstitial lung disease, A. fib RVR, chronic dyspnea, severe deconditioning, and  CHF - Last admission patient deemed not a surgical candidate for severe AS and plan was for OP palliative care. He is DNR -after a very brief discussion with the patient's wife and daughter, it is clear that they intention was to move toward palliative care and potentially hospice care. - IV cardizem for rate control; with intermittent IV Lopressor for additional rate control consider. - IV lasix 20 mg given - palliative care consulted for goals of care discussion. This will help direct how aggressive overall care should be.   Afib RVR - known h/o Permanent Afib - on Eliquis, rate controlled with Lopressor 50 mg BID - but pt refused home meds including Lopressor & Eliquis. : Rates are in the 120 - 140s;  Started on IV Diltiazem gtt (titrated up to 15 mg/hr - but without titration boluses)  - For ease of titration, continue with IV Cardizem -- PTA lopressor 41mBID for rate control will convert to IV at reduced dose = 2.5 mg q6hr  - Not a candidate for amiodarone given ILD - Patient refused meds this morning - including lopressor -  for better rate control - If he refuses Eliquis might need IV heparin   Aortic stenosis/Mild to Mod  MS - [facility] however cath showed moderate. Repeat echo showed severe low flow gradient AS and moderate MS - he is not a good surgical/TAVR candidate given underlying lung disease - plan for palliative care consult  Acute on Chronic combined CHF - BNP 360. CXR with pulmonary vascular congestion and interstitial edema. Daughter reported LLE has been essentially unchanged - PTA lasix 20 mg daily - has mild volume overload on exam. Will given IV lasix 20 mg in the ED. Will given IV lasix 40 mg in AM  HTN - refused to take meds in the AM including Lopressor. - IV cardizem  Gtt along with scheduled IV Lopressor as above - BP reasonable - Home Imdur, Lopressor continued however patient refused medications this am.    HLD - currently on statin - however if the trajectory of care is towards palliative care / hospice, this could be discontinued.   CAD - last cath in 2020 showed 85% ramus lesion, moderate LAD lesion medically managed - HS trop mildly elevated with flat trend (mild demand ischemia) - NO further evaluation. - No chest pain - continue aspirin, statin and BB   Left rib fractures - per IM  For questions or updates, please contact [facility] Please consult [email or web address] for contact info under    Signed, Cadence HNinfa Meeker PA-C  01/07/2020 3:23 PM   ATTENDING ATTESTATION  I have seen, examined and evaluated the patient this PM along with Cadence Furth, PA-C.  After reviewing all the available data and chart, we discussed the patients laboratory, study & physical findings as well as symptoms in detail. I agree with her findings, examination as well as impression recommendations as per our discussion.    Attending adjustments noted in italics.   This is a somewhat difficult situation, cardiology has been consulted, patient was sent home for palliative care with intentions of potentially moving toward long-term palliation/hospice care.  He  was  brought to the emergency room because of fall and found to be hypoxic.  This is probably against the wishes of/intent of palliative care and hospice.  He now is in the hospital with A. fib RVR-not a new diagnosis for him, and notably hypoxic in dyspneic etc.  He has been deemed to be a poor candidate for surgical aortic valve replacement (and presumably TAVR) based on his ILD and poor functional status.  The decision has already been made to proceed with palliative care.  Clearly, with him not taking his medications, he is going to go back to the A. fib RVR.  We will assist with converting oral rate control to IV rate control.  He is dyspneic which is probably at least in some part related to aortic stenosis and reduced EF related CHF notably exacerbated by A. fib RVR along with his underlying lung disease.  We can also give him IV diuretic as noted.  We have given recommendations on rate control that are now IV since he has not been taking p.o. meds.  Certainly, if he is not taking p.o., this precludes the use of DOAC and statin etc.  It also would mean that he is going potentially more toward hospice care and simply palliative care because without p.o. intake, he is most certainly terminal with a relatively significant short-term mortality rate.  If he does start taking oral medications, we can switch him back to by mouth medications and he can be discharged on palliative care.  Certainly he cannot continue on IV medications for treatment unless he were to be converted to hospice care.  At this point, the most appropriate course of action is palliative care consultation with their team helping to assist in gauging management plan whether the plan is continued palliative care where he will continue to take his medications, switching to home hospice versus inpatient hospice.  Decisions about any further treatment options will be in no small part determined by whether or not he will actually take by mouth  medications, and will be eating and drinking.  Cardiology will standby to assist with adjusting IV medications, however, this is something that does not require formal consultation follow-up.   He was already started on diltiazem, and we added IV beta-blocker since he is not taking p.o.  We have also written for a dose of IV Lasix tomorrow.  At this point, we will probably switch back to 20 mg Lasix IV or p.o. the following day.  Determine whether he is taking p.o. and consider converting back to home dose of beta-blocker.  I would not anticoagulate with IV heparin if he is not taking Eliquis.  I spent at least a half an hour talking with the patient's wife and daughter with him minimally engaged in that he would occasionally open his eyes any knowledge the conversation.  We talked about their wishes, and what it means if he stops taking p.o. medications and food/water.  The wife and daughter are both in agreement that they understand that no further aggressive management options for the valve or his lung disease are being offered, and the decision already made to move toward palliation.  The wife understands that she cannot at this point manage caring for him physically as far as assisting the movements and assisting him to get up and down.  She does not have the strength and she herself is suffered a fall with hip fracture this past year.  My gestalt feeling with this conversation was that  the 2 of them are leaning more toward consideration of hospice care.  If this is the decided option, we will be available to help determine which medications need to be used in which do not be to be used.  I do think the rate control of A. fib with intermittent diuresis is reasonable for palliation.  If he is not taking p.o., then we will need to continue using IV medications, otherwise going back to his home oral regimen is reasonable.  I will not formally sign off as we will be available to assist with adjustment  medications, however I do not feel it a patient who is known to her current care and hospice requires subspecialty consultation follow-up along with the Primary Service & Palliative Med team.  pls contact me via page or secure messaging for further assistance.    Glenetta Hew, M.D., M.S. Interventional Cardiologist   Pager # [phone] Phone # [phone] [address] [place], [place] [address]

## 2020-01-07 NOTE — Progress Notes (Signed)
   01/07/20 1650  Assess: MEWS Score  Temp 98.9 F (37.2 C)  BP 109/71  Pulse Rate (!) 131  ECG Heart Rate (!) 121  Resp 19  SpO2 96 %  O2 Device Nasal Cannula  O2 Flow Rate (L/min) 3 L/min  Assess: MEWS Score  MEWS Temp 0  MEWS Systolic 0  MEWS Pulse 2  MEWS RR 0  MEWS LOC 0  MEWS Score 2  MEWS Score Color Yellow  Assess: if the MEWS score is Yellow or Red  Were vital signs taken at a resting state? Yes  Focused Assessment No change from prior assessment  Early Detection of Sepsis Score *See Row Information* Medium  MEWS guidelines implemented *See Row Information* No, previously yellow, continue vital signs every 4 hours  Treat  MEWS Interventions Administered scheduled meds/treatments;Administered prn meds/treatments  Pain Scale 0-10  Pain Score 8  Pain Location Rib cage  Pain Orientation Left  Take Vital Signs  Increase Vital Sign Frequency  Yellow: Q 2hr X 2 then Q 4hr X 2, if remains yellow, continue Q 4hrs  Escalate  MEWS: Escalate Yellow: discuss with charge nurse/RN and consider discussing with provider and RRT  Notify: Charge Nurse/RN  Name of Charge Nurse/RN Notified Eun RN  Date Charge Nurse/RN Notified 01/07/20  Time Charge Nurse/RN Notified 1730  Notify: Provider  Provider Name/Title N/A (Per ED nurse, she notified MD and MD is awared)  Response See new orders  Notify: Rapid Response  Name of Rapid Response RN Notified N/A  Document  Patient Outcome Not stable and remains on department  Progress note created (see row info) Yes

## 2020-01-07 NOTE — ED Notes (Signed)
Patient is resting comfortably. 

## 2020-01-07 NOTE — ED Notes (Signed)
Pt assisted with urinal

## 2020-01-07 NOTE — ED Notes (Signed)
Report called-- will transport pt once cards consult is finished.

## 2020-01-07 NOTE — ED Notes (Signed)
Pt moaning and grimacing as staff attempted to reposition pt in bed for comfort. Complaining of rib pain on the left side, given PRN dilaudid

## 2020-01-07 NOTE — ED Notes (Signed)
Pt calling out multiple times for help and reorientation Pt is oriented to self but not situation, pt states he does not know where he is or why he is here.  Pt anxious and pulling at cords attempting to get out of bed, staff assisted pt with urinal.

## 2020-01-07 NOTE — ED Notes (Signed)
Secure messaged Nolberto Hanlon, MD stating  Hey just wanted to touch base with you about this pt. I'm just taking over from the previous RN but it seems like the patient is declining from when he was first admitted. I've had to increase his oxygen from 2L [place] to 6L [place]. He is anxious--continually hollering out and pulling off medical equipment. He is maxed out on diltiazem and his rate is currently bouncing in the 120/130s. Refusing to take all PO meds

## 2020-01-07 NOTE — Progress Notes (Signed)
Pt refused CPAP for the night.   
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
## 2020-01-07 NOTE — ED Notes (Signed)
Ordered breakfast 

## 2020-01-07 NOTE — ED Notes (Signed)
Paged provider about elevated HR withmaxed Diltiazem drip, pt increased confusion and inability to swallow PO medications. Waiting for further orders.

## 2020-01-08 DIAGNOSIS — I4891 Unspecified atrial fibrillation: Secondary | ICD-10-CM | POA: Diagnosis not present

## 2020-01-08 DIAGNOSIS — I5043 Acute on chronic combined systolic (congestive) and diastolic (congestive) heart failure: Secondary | ICD-10-CM | POA: Diagnosis not present

## 2020-01-08 DIAGNOSIS — I350 Nonrheumatic aortic (valve) stenosis: Secondary | ICD-10-CM | POA: Diagnosis not present

## 2020-01-08 DIAGNOSIS — S2242XA Multiple fractures of ribs, left side, initial encounter for closed fracture: Secondary | ICD-10-CM | POA: Diagnosis not present

## 2020-01-08 LAB — BASIC METABOLIC PANEL
Anion gap: 10 (ref 5–15)
BUN: 22 mg/dL (ref 8–23)
CO2: 23 mmol/L (ref 22–32)
Calcium: 9.4 mg/dL (ref 8.9–10.3)
Chloride: 101 mmol/L (ref 98–111)
Creatinine, Ser: 1.14 mg/dL (ref 0.61–1.24)
GFR, Estimated: >60 mL/min (ref >60)
Glucose, Bld: 135 mg/dL — ABNORMAL HIGH (ref 70–99)
Potassium: 4.1 mmol/L (ref 3.5–5.1)
Sodium: 134 mmol/L — ABNORMAL LOW (ref 135–145)

## 2020-01-08 MED ORDER — MORPHINE SULFATE (CONCENTRATE) 10 MG/0.5ML PO SOLN
5.0000 mg | Freq: Four times a day (QID) | ORAL | Status: DC
  Administered 2020-01-08 – 2020-01-09 (×4): 5 mg via SUBLINGUAL
  Filled 2020-01-08 (×4): qty 0.5

## 2020-01-08 MED ORDER — IPRATROPIUM BROMIDE 0.06 % NA SOLN
2.0000 | Freq: Two times a day (BID) | NASAL | Status: DC | PRN
  Filled 2020-01-08: qty 15

## 2020-01-08 MED ORDER — GLYCOPYRROLATE 0.2 MG/ML IJ SOLN
0.2000 mg | INTRAMUSCULAR | Status: DC | PRN
  Administered 2020-01-09 – 2020-01-10 (×5): 0.2 mg via INTRAVENOUS
  Filled 2020-01-08 (×5): qty 1

## 2020-01-08 MED ORDER — ONDANSETRON 4 MG PO TBDP
4.0000 mg | ORAL_TABLET | Freq: Four times a day (QID) | ORAL | Status: DC | PRN
  Filled 2020-01-08: qty 1

## 2020-01-08 MED ORDER — BIOTENE DRY MOUTH MT LIQD: 15.0000 mL | OROMUCOSAL | Status: DC | PRN

## 2020-01-08 MED ORDER — LORAZEPAM 2 MG/ML IJ SOLN
1.0000 mg | INTRAMUSCULAR | Status: DC | PRN
  Administered 2020-01-09 (×3): 1 mg via INTRAVENOUS
  Filled 2020-01-08 (×3): qty 1

## 2020-01-08 MED ORDER — ONDANSETRON HCL 4 MG/2ML IJ SOLN: 4.0000 mg | Freq: Four times a day (QID) | INTRAMUSCULAR | Status: DC | PRN

## 2020-01-08 MED ORDER — LORAZEPAM 1 MG PO TABS
1.0000 mg | ORAL_TABLET | ORAL | Status: DC | PRN
  Administered 2020-01-08: 1 mg via ORAL
  Filled 2020-01-08: qty 1

## 2020-01-08 MED ORDER — METOPROLOL TARTRATE 5 MG/5ML IV SOLN: 2.5000 mg | INTRAVENOUS | Status: DC | PRN

## 2020-01-08 MED ORDER — METOPROLOL TARTRATE 25 MG PO TABS
25.0000 mg | ORAL_TABLET | Freq: Two times a day (BID) | ORAL | Status: DC
  Administered 2020-01-08: 25 mg via ORAL
  Filled 2020-01-08: qty 1

## 2020-01-08 MED ORDER — DILTIAZEM HCL 60 MG PO TABS
60.0000 mg | ORAL_TABLET | Freq: Four times a day (QID) | ORAL | Status: DC
  Administered 2020-01-08 – 2020-01-09 (×4): 60 mg via ORAL
  Filled 2020-01-08 (×4): qty 1

## 2020-01-08 NOTE — Progress Notes (Signed)
   01/07/20 2055  Assess: MEWS Score  Temp 98.4 F (36.9 C)  BP 128/72  Pulse Rate (!) 123  ECG Heart Rate (!) 119  Resp 20  SpO2 90 %  Assess: MEWS Score  MEWS Temp 0  MEWS Systolic 0  MEWS Pulse 2  MEWS RR 0  MEWS LOC 0  MEWS Score 2  MEWS Score Color Yellow  Assess: if the MEWS score is Yellow or Red  Were vital signs taken at a resting state? Yes  Focused Assessment No change from prior assessment  Early Detection of Sepsis Score *See Row Information* Medium  MEWS guidelines implemented *See Row Information* No, previously yellow, continue vital signs every 4 hours  Treat  MEWS Interventions Administered prn meds/treatments  Notify: Charge Nurse/RN  Name of Charge Nurse/RN Notified Danae Chen RN  Date Charge Nurse/RN Notified 01/07/20  Time Charge Nurse/RN Notified 2148  Document  Patient Outcome Stabilized after interventions

## 2020-01-08 NOTE — Progress Notes (Signed)
PROGRESS NOTE    Larry Church  WLN:[id number] DOB: [birth year] DOA: 12/22/2019 PCP: Lilian Coma., MD    Brief Narrative:  Larry Church is a 86 y.o. male with medical history significant of GERD, combined systolic and diastolic heart failure, severe aortic stenosis, BPH, CAD, degenerative disc disease, depression, hyperlipidemia, hypertension, atrial fibrillation, interstitial lung disease, OSA on CPAP, memory deficits who presents after EMS came out to see patient for a fall and was found to be tachycardic and hypoxic  on admission with afib rvr, started on cardizem gtt. Refused po meds earlier, discussed this with his daughter at bedside. Daughter stated pt does have dementia and at times is confused .palliative care consulted.   Consultants:   Cardiology, palliative care  Procedures:   Antimicrobials:      Subjective: Patient pleasant, reports his shortness of breath is improving, denies chest pain or dizziness while at rest  Objective: Vitals:   01/08/20 0013 01/08/20 0014 01/08/20 0255 01/08/20 0300  BP: 129/73   118/80  Pulse: (!) 117  (!) 109 (!) 128  Resp: (!) 24  (!) 27 20  Temp: 98.2 F (36.8 C)   98.2 F (36.8 C)  TempSrc: Oral   Oral  SpO2: 95%  96% 92%  Weight:  108.3 kg    Height:       No intake or output data in the 24 hours ending 01/08/20 0747 Filed Weights   01/07/20 1650 01/08/20 0014  Weight: 106.9 kg 108.3 kg    Examination: Calm, on nasal cannula, comfortable + crackles with decreased breath sounds at bases Irregular mildly tacky, S1-S2, no gallop Obese soft nontender nondistended +b/l edema Aaxox2, not to date again. Grossly intact Mood and affect appropriate in current setting    Data Reviewed: I have personally reviewed following labs and imaging studies  CBC: Recent Labs  Lab 12/28/2019 2012 01/07/20 0551  WBC 14.2* 12.8*  HGB 12.9* 12.1*  HCT 40.4 37.7*  MCV 105.8* 105.0*  PLT 276 740   Basic Metabolic  Panel: Recent Labs  Lab 12/29/2019 2012 01/07/20 0551 01/08/20 0335  NA 132* 135 134*  K 4.5 4.0 4.1  CL 99 99 101  CO2 21* 24 23  GLUCOSE 157* 135* 135*  BUN 20 19 22   CREATININE 1.13 1.10 1.14  CALCIUM 9.4 9.3 9.4   GFR: Estimated Creatinine Clearance: 54.7 mL/min (by C-G formula based on SCr of 1.14 mg/dL). Liver Function Tests: Recent Labs  Lab 01/07/20 0551  AST 28  ALT 20  ALKPHOS 65  BILITOT 1.1  PROT 6.7  ALBUMIN 2.4*   No results for input(s): LIPASE, AMYLASE in the last 168 hours. No results for input(s): AMMONIA in the last 168 hours. Coagulation Profile: No results for input(s): INR, PROTIME in the last 168 hours. Cardiac Enzymes: No results for input(s): CKTOTAL, CKMB, CKMBINDEX, TROPONINI in the last 168 hours. BNP (last 3 results) No results for input(s): PROBNP in the last 8760 hours. HbA1C: No results for input(s): HGBA1C in the last 72 hours. CBG: No results for input(s): GLUCAP in the last 168 hours. Lipid Profile: No results for input(s): CHOL, HDL, LDLCALC, TRIG, CHOLHDL, LDLDIRECT in the last 72 hours. Thyroid Function Tests: No results for input(s): TSH, T4TOTAL, FREET4, T3FREE, THYROIDAB in the last 72 hours. Anemia Panel: No results for input(s): VITAMINB12, FOLATE, FERRITIN, TIBC, IRON, RETICCTPCT in the last 72 hours. Sepsis Labs: No results for input(s): PROCALCITON, LATICACIDVEN in the last 168 hours.  Recent Results (from  the past 240 hour(s))  Respiratory Panel by RT PCR (Flu A&B, Covid) - Nasopharyngeal Swab     Status: None   Collection Time: 01/14/2020  9:10 PM   Specimen: Nasopharyngeal Swab; Nasopharyngeal(NP) swabs in vial transport medium  Result Value Ref Range Status   SARS Coronavirus 2 by RT PCR NEGATIVE NEGATIVE Final    Comment: (NOTE) SARS-CoV-2 target nucleic acids are NOT DETECTED.  The SARS-CoV-2 RNA is generally detectable in upper respiratoy specimens during the acute phase of infection. The lowest concentration  of SARS-CoV-2 viral copies this assay can detect is 131 copies/mL. A negative result does not preclude SARS-Cov-2 infection and should not be used as the sole basis for treatment or other patient management decisions. A negative result may occur with  improper specimen collection/handling, submission of specimen other than nasopharyngeal swab, presence of viral mutation(s) within the areas targeted by this assay, and inadequate number of viral copies (<131 copies/mL). A negative result must be combined with clinical observations, patient history, and epidemiological information. The expected result is Negative.  Fact Sheet for Patients:  PinkCheek.be  Fact Sheet for Healthcare Providers:  GravelBags.it  This test is no t yet approved or cleared by the [place] FDA and  has been authorized for detection and/or diagnosis of SARS-CoV-2 by FDA under an Emergency Use Authorization (EUA). This EUA will remain  in effect (meaning this test can be used) for the duration of the COVID-19 declaration under Section 564(b)(1) of the Act, 21 U.S.C. section 360bbb-3(b)(1), unless the authorization is terminated or revoked sooner.     Influenza A by PCR NEGATIVE NEGATIVE Final   Influenza B by PCR NEGATIVE NEGATIVE Final    Comment: (NOTE) The Xpert Xpress SARS-CoV-2/FLU/RSV assay is intended as an aid in  the diagnosis of influenza from Nasopharyngeal swab specimens and  should not be used as a sole basis for treatment. Nasal washings and  aspirates are unacceptable for Xpert Xpress SARS-CoV-2/FLU/RSV  testing.  Fact Sheet for Patients: PinkCheek.be  Fact Sheet for Healthcare Providers: GravelBags.it  This test is not yet approved or cleared by the [place] FDA and  has been authorized for detection and/or diagnosis of SARS-CoV-2 by  FDA under an Emergency Use  Authorization (EUA). This EUA will remain  in effect (meaning this test can be used) for the duration of the  Covid-19 declaration under Section 564(b)(1) of the Act, 21  U.S.C. section 360bbb-3(b)(1), unless the authorization is  terminated or revoked. Performed at [facility], [place] [address] [place], [place] [address]          Radiology Studies: DG Chest Portable 1 View  Result Date: 01/07/2020 CLINICAL DATA:  Decreased breath sounds and chest pain. EXAM: PORTABLE CHEST 1 VIEW COMPARISON:  12/19/2019 FINDINGS: Cardiac enlargement with pulmonary vascular congestion. Interstitial pattern suggests edema. Probable small left pleural effusion. No pneumothorax. Degenerative changes in the spine and shoulders. Old left rib fractures. Calcification of the aorta. Postoperative change in the cervical spine. IMPRESSION: Cardiac enlargement with pulmonary vascular congestion and interstitial edema. Probable small left pleural effusion. Similar appearance to previous study. Electronically Signed   By: Lucienne Capers M.D.   On: 01/07/2020 01:39   DG Chest Portable 1 View  Result Date: 12/23/2019 CLINICAL DATA:  Dyspnea. EXAM: PORTABLE CHEST 1 VIEW COMPARISON:  12/13/2019 FINDINGS: There is cardiomegaly with vascular congestion and mild interstitial edema. There are bilateral pleural effusions with adjacent airspace disease as before. Pulmonary fibrosis is again noted. There  are degenerative changes of both glenohumeral joints. Aortic calcifications are noted. There are apparent left-sided rib fractures which appear to be new from prior study. IMPRESSION: 1. Multiple acute appearing left-sided rib fractures are noted. No pneumothorax. 2. Pulmonary fibrosis again noted. 3. Persistent left-sided airspace disease favored to represent a combination of a left-sided pleural effusion or hemothorax in combination with atelectasis. An infiltrate is not entirely excluded. Electronically Signed   By:  Constance Holster M.D.   On: 12/30/2019 21:45        Scheduled Meds: . alfuzosin  10 mg Oral Q breakfast  . apixaban  5 mg Oral BID  . aspirin EC  81 mg Oral Daily  . atorvastatin  40 mg Oral Daily  . finasteride  5 mg Oral Daily  . furosemide  40 mg Intravenous Daily  . gabapentin  100 mg Oral TID  . ipratropium  2 spray Each Nare BID  . isosorbide mononitrate  30 mg Oral Daily  . memantine  10 mg Oral BID  . metoprolol tartrate  2.5 mg Intravenous Q6H  . pantoprazole  40 mg Oral Daily  . sodium chloride flush  3 mL Intravenous Q12H  . venlafaxine XR  150 mg Oral Daily   And  . venlafaxine XR  75 mg Oral QHS   Continuous Infusions: . diltiazem (CARDIZEM) infusion 15 mg/hr (01/08/20 0542)    Assessment & Plan:   Principal Problem:   Atrial fibrillation with RVR ([place]) Active Problems:   Essential hypertension   Morbid obesity (HCC)   Traumatic subarachnoid hemorrhage (HCC)   Obstructive sleep apnea treated with bilevel positive airway pressure (BiPAP)   Gastroesophageal reflux disease without esophagitis   Longstanding persistent atrial fibrillation (HCC)   Mild cognitive impairment   Aortic stenosis, severe   Acute on chronic combined systolic and diastolic CHF (congestive heart failure) (HCC)   Physical deconditioning   Multiple rib fractures   Palliative care by specialist   Goals of care, counseling/discussion   S/p Fall- with Left rib fractures Daughter reported multiple falls in the past  He is deconditioned     Acute on chronic respiratory failure/AS/ILD Found by EMS to be hypoxic and tachycardic.  With leg swelling Likely multifactorial-AMS, ILD, found with A. fib RVR BMP elevated probably underestimated due to his weight Keep O2 sat above 92% Rate control his A. Fib Continue Lasix monitor renal function Patient has severe low-flow gradient AS an moderate AS-per cardiology he is not a good surgical/TAVR candidate given his underlying lung  disease Palliative care consulted please see note-that family would ideally want home with hospice care once patient is medically optimized as much as possible.  Family is open to residential hospice in the event of further decline. Monitor I's and O Monitor daily weight     Atrial fibrillation with RVR Started on on cardizem gtt, pt refused po med  Rate better controlled at this time Cardiology following Not a good candidate for amiodarone given ILD Continue IV beta-blockers Spoke to patient's daughter yesterday about risk versus benefit of Eliquis especially with his multiple falls and she is okay with discontinuing this.  We will keep him on Eliquis for now but however on discharge will need to readdress this with the daughter again .   CAD Hypertension  Hyperlipidemia Acute on chronic Combined systolic and diastolic heart failure Volume overloaded on exam, likely due to AS, and afib rvr. Or afib rvr occurred causing fluid overload EF 40-50% on echo in oct.  bnp elevated 11/23-continue IV Lasix, beta-blockers, and aspirin  Rate controlled as above  BPH -Continue alfuzosin and finasteride     OSA on CPAP CPAP at night/sleep  Dementia - Family reportedly has power of attorney, will need to confirm Per daughter he has dementia Continue with mematine Minimize sedation Monitor for delirium as he is hospitalized  DVT prophylaxis: Eliquis Code Status:DNR Family Communication: daughter at bedside  Status is: Inpatient  Remains inpatient appropriate because:IV treatments appropriate due to intensity of illness or inability to take PO   Dispo: The patient is from: Home              Anticipated d/c is to: TBD              Anticipated d/c date is: 3 days              Patient currently is not medically stable to d/c.            LOS: 2 days   Time spent: 35 min with >50% on coc    Nolberto Hanlon, MD [facility] Pager [phone]  If 7PM-7AM,  please contact night-coverage [email or web address] Password [facility] 01/08/2020, 7:47 AM

## 2020-01-08 NOTE — Progress Notes (Signed)
    It appears that the patient has decided on residential hospice.  With this plan in place, active cardiology involvement is no longer warranted. Heart rate control has been a little less effective -> IV diltiazem converted to oral -> also has as needed IV beta-blocker written.  Would add at least 25 mg twice daily for additional rate control. ->  Again if he is not taking oral medications, could consider IV rate control agents versus no escalation of care.  Not unreasonable can to continue Imdur and Lasix for symptomatic relief.  This can be stopped at the discretion of the hospice team.  Eliquis is another medication that is essentially for risk reduction as far as stroke.  More than likely this will be discontinued in the palliative care/hospice setting.   [facility] will sign off.   Medication Recommendations: See above Other recommendations (labs, testing, etc): None Follow up as an outpatient: Likely not necessary as he is planned for discharge to hospice.

## 2020-01-08 NOTE — TOC Initial Note (Signed)
Transition of Care [facility]) - Initial/Assessment Note    Patient Details  Name: Larry Church MRN: [id number] Date of Birth: May 31, 1932  Transition of Care Larry Church) CM/SW Contact:    Larry Church, [place] Phone Number: 01/08/2020, 12:39 PM  Clinical Narrative:                 CSW received notice from MD that pt's family has decided on residential Larry with Larry Church, Larry Church. CSW confirmed with pt's spouse of this plan. CSW spoke with [facility] Larry Church, no beds available today, relayed to family no availability. CSW will continue to follow.         Patient Goals and CMS Choice        Expected Discharge Plan and Services                                                Prior Living Arrangements/Services                       Activities of Daily Living      Permission Sought/Granted                  Emotional Assessment              Admission diagnosis:  Atrial fibrillation with rapid ventricular response (Larry Church) [I48.91] Atrial fibrillation with RVR (Larry Church) [I48.91] Closed fracture of multiple ribs of left side, initial encounter [S22.42XA] Aortic valve stenosis, etiology of cardiac valve disease unspecified [I35.0] Patient Active Problem List   Diagnosis Date Noted  . Physical deconditioning 01/07/2020  . Multiple rib fractures 01/07/2020  . Palliative care by specialist   . Goals of care, counseling/discussion   . Atrial fibrillation with RVR (Larry Church) 12/19/2019  . Acute CHF (congestive heart failure) (Larry Church) 12/13/2019  . E. coli UTI 12/13/2019  . Aortic stenosis, severe 09/19/2018  . Dilated cardiomyopathy ([place]) 09/19/2018  . DOE (dyspnea on exertion) 09/19/2018  . Acute on chronic combined systolic and diastolic CHF (congestive heart failure) ([place]) 09/19/2018  . Longstanding persistent atrial fibrillation ([place]) 01/14/2018  . Abdominal pain, right lower quadrant 01/14/2018  . Mild cognitive impairment 01/14/2018  .  BPH (benign prostatic hyperplasia) 01/14/2018  . Macrocytosis 01/14/2018  . Mild intermittent asthma 01/16/2015  . Obstructive sleep apnea treated with bilevel positive airway pressure (BiPAP) 01/16/2015  . Gastroesophageal reflux disease without esophagitis 01/16/2015  . Current use of beta blocker 01/16/2015  . Coughing 01/06/2015  . Primary osteoarthritis of left knee 09/24/2014  . Primary osteoarthritis of knee 09/24/2014  . Traumatic subarachnoid hemorrhage ([place]) 02/20/2014  . CAD (coronary artery disease) 12/25/2013  . Essential hypertension 12/25/2013  . Morbid obesity ([place]) 12/25/2013  . Dyslipidemia 12/11/2013  . Fatigue 10/31/2013  . Screening for lipoid disorders 10/31/2013  . Obstructive apnea 10/31/2013  . Cervical pain 09/26/2013  . Beat, premature ventricular 09/13/2013  . Low back pain 09/10/2013  . 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 DDD (degenerative disc disease), lumbosacral 09/10/2013  . Acid reflux 09/10/2013  . History of colon polyps 09/10/2013  . Lumbar canal stenosis 09/10/2013  . Depression, major, recurrent ([place]) 09/10/2013  . Arthritis, degenerative 09/10/2013  . Atrophic kidney 09/10/2013  . Compressed spine fracture ([place]) 09/10/2013   PCP:  Larry Church., MD Pharmacy:   [facility] [phone] -  [place], [address] AT [facility] [address] [place] [address] Phone: [phone] Fax: [phone]     Social Determinants of Health (SDOH) Interventions    Readmission Risk Interventions No flowsheet data found.

## 2020-01-08 NOTE — Progress Notes (Signed)
Daily Progress Note   Patient Name: Larry Church       Date: 01/08/2020 DOB: 10-16-1932  Age: 86 y.o. MRN#: [id number] Attending Physician: Nolberto Hanlon, MD Primary Care Physician: Lilian Coma., MD Admit Date: 12/19/2019  Reason for Follow-up: continued [place] discussion, hospice evaluation, symptom management  Subjective: Patient is alert but remains confused. He has rib pain with any movement. He refused CPAP last night. He did not eat dinner last night or breakfast this morning. He is taking oral medications.  Diltiazem infusion is at 15 mg/hr.   I met with wife Stanton Kidney), daughter Seth Bake) , and son Marya Amsler) in the waiting room.  I introduced the concept of a comfort path to and encouraged them to transition the focus to comfort and dignity rather than prolonging life.   Reviewed hospice philosophy and provided information on home vs residential hospice services. Discussed that if patient continues not eating/drinking, his prognosis will be less than 2 weeks. Also discussed that his pain and other symptoms will be more effectively managed in residential hospice. Family agrees with transfer to residential hospice, and would prefer facility in [facility] as it is closer to his wife.   Discussed transitioning to comfort care while in the hospital, and what that would look like--keeping him clean and dry, no labs, no artificial hydration or feeding, no antibiotics, minimizing of medications, comfort feeds, medication for pain and dyspnea as needed. Family verbalizes understanding and agrees with this plan of care.  Will need continued heart rate control for comfort, but patient cannot trasfer to hospice facility on diltiazem infusion. Plan to convert diltiazem infusion to oral medication. Plan of  care discussed with attending MD, nursing, and pharmacy.    Length of Stay: 2  Current Medications: Scheduled Meds:  . alfuzosin  10 mg Oral Q breakfast  . apixaban  5 mg Oral BID  . diltiazem  60 mg Oral Q6H  . finasteride  5 mg Oral Daily  . furosemide  40 mg Intravenous Daily  . gabapentin  100 mg Oral TID  . isosorbide mononitrate  30 mg Oral Daily  . metoprolol tartrate  25 mg Oral BID  . morphine CONCENTRATE  5 mg Sublingual Q6H  . pantoprazole  40 mg Oral Daily  . sodium chloride flush  3 mL Intravenous Q12H  .  venlafaxine XR  150 mg Oral Daily   And  . venlafaxine XR  75 mg Oral QHS      PRN Meds: acetaminophen **OR** acetaminophen, albuterol, antiseptic oral rinse, glycopyrrolate, HYDROmorphone (DILAUDID) injection, ipratropium, LORazepam **OR** LORazepam, metoprolol tartrate, ondansetron **OR** ondansetron (ZOFRAN) IV, polyethylene glycol, polyvinyl alcohol  Physical Exam Constitutional:      General: He is not in acute distress.    Appearance: He is ill-appearing.  HENT:     Head: Normocephalic and atraumatic.  Cardiovascular:     Rate and Rhythm: Tachycardia present.     Comments: A-fib on monitor Pulmonary:     Effort: Pulmonary effort is normal.  Musculoskeletal:     Right lower leg: Edema present.     Left lower leg: Edema present.  Neurological:     Mental Status: He is alert. He is confused.             Vital Signs: BP 116/67 (BP Location: Right Arm)   Pulse (!) 128   Temp 98.7 F (37.1 C) (Oral)   Resp (!) 23   Ht '5\' 6"'  (1.676 m)   Wt 108.3 kg   SpO2 92%   BMI 38.54 kg/m  SpO2: SpO2: 92 % O2 Flow Rate: O2 Flow Rate (L/min): 3 L/min  Intake/output summary:   Intake/Output Summary (Last 24 hours) at 01/08/2020 1712 Last data filed at 01/08/2020 1442 Gross per 24 hour  Intake 819.24 ml  Output 850 ml  Net -30.76 ml   LBM: Last BM Date: 01/07/20 Baseline Weight: Weight: 106.9 kg Most recent weight: Weight: 108.3 kg         Palliative Assessment/Data: PPS 20%   Flowsheet Rows     Most Recent Value  Intake Tab  Referral Department -- (P)   [ED Provider]  Unit at Time of Referral ER (P)   Palliative Care Primary Diagnosis Cardiac (P)   Date Notified 12/24/2019 (P)   Palliative Care Type Return patient Palliative Care (P)   Reason for referral Clarify Goals of Care (P)   Date of Admission 12/26/2019 (P)   Date first seen by Palliative Care 01/07/20 (P)   # of days Palliative referral response time 1 Day(s) (P)   # of days IP prior to Palliative referral 0 (P)   Clinical Assessment  Psychosocial & Spiritual Assessment  Palliative Care Outcomes  Patient/Family meeting held? Yes (P)   Who was at the meeting? daughter, wife (P)   Palliative Care Outcomes Clarified goals of care, Counseled regarding hospice (P)         Palliative Care Assessment & Plan   HPI/Patient Profile: 86 y.o. male  with past medical history of CAD, chronic combined systolic and diastolic heart failure, ischemic cardiomyopathy, atrial fibrillation, history of subarachnoid hemorrhage, HTN, and hyperlipidemia. He was hospitalized 10/28 through 10/31 for CHF exacerbation and found to have severe aortic stenosis and interstitial lung disease. He presented to the emergency department on 12/28/2019 with shortness of breath and pain from a fall 3 days ago.  ED Course: A-fib with RVR in the 140's. Imaging shows left rib fracture and left effusion versus hemothorax versus pneumonia. WBC 14 and BNP 363. Started on diltiazem infusion and given Levaquin.  Patient admitted to [facility] for management of left rib fractures, A-fib with RVR, acute on chronic respiratory failure, and acute on chronic CHF with mild volume overload.  Assessment: - multiple rib fractures - acute on chronic respiratory failure - A-fib with RVR - acute on chronic  combined systolic and diastolic heart failure - aortic stenosis, severe - dementia - OSA on CPAP - ?acute  delirium  Recommendations/Plan:  Full comfort measures initiated  DNR/DNI as previously documented  Transfer to [place] facility when bed becomes available - TOC consult placed; TOC and hospice liaison notifed  Start po diltiazem 60 mg every 6 hours  Discontinue diltiazem infusion 1 hour after first po dose given  Continue Imdur and lasix for comfort  Added orders for symptom management at EOL as well as discontinued orders that were not focused on comfort  Unrestricted visitation per [place] EOL visitation policy   Provide frequent assessments and administer PRN medications as clinically necessary to ensure EOL comfort  PMT will continue to follow holistically  After reviewing cardiology note: (appreciate the recommendations)   will add metoprolol 25 mg BID  Eliquis can be discontinued at discharge  Symptom Management:   Morphine CONCENTRATE 10 mg/0.53m oral solution 5 mg SL every 6 hours  Hydromorphone (DILAUDID) 0.5 mg IV every 4 hours prn for breakthrough pain  Lorazepam (ATIVAN) prn for anxiety  Glycopyrrolate (ROBINUL) for excessive secretions  Ondansetron (ZOFRAN) prn for nausea  Polyvinyl alcohol (LIQUIFILM TEARS) prn for dry eyes  Antiseptic oral rinse (BIOTENE) prn for dry mouth   Goals of Care and Additional Recommendations:  Limitations on Scope of Treatment: Full Comfort Care  Code Status:  DNR/DNI  Prognosis:   < 2 weeks  Discharge Planning:  [place] Dr. AKurtis Bushman nursing, CSW, case management  Thank you for allowing the Palliative Medicine Team to assist in the care of this patient.   Total Time 35 minutes Prolonged Time Billed  no       Greater than 50%  of this time was spent counseling and coordinating care related to the above assessment and plan.  JLavena Bullion NP  Please contact Palliative Medicine Team phone at [phone] questions and concerns.

## 2020-01-08 NOTE — TOC CAGE-AID Note (Signed)
Transition of Care [facility]) - CAGE-AID Screening   Patient Details  Name: Larry Church MRN: [id number] Date of Birth: 1932-06-04  Transition of Care [facility]) CM/SW Contact:    Emeterio Reeve, [place] Phone Number: 01/08/2020, 3:41 PM   Clinical Narrative:  CSW met with pt at bedside. CSW introduced self and explained role at the hospital.  Pt denies alcohol use. Pt denies substance use. Pt did not need any resources at this time.    CAGE-AID Screening:    Have You Ever Felt You Ought to Cut Down on Your Drinking or Drug Use?: No Have People Annoyed You By Critizing Your Drinking Or Drug Use?: No Have You Felt Bad Or Guilty About Your Drinking Or Drug Use?: No Have You Ever Had a Drink or Used Drugs First Thing In The Morning to Steady Your Nerves or to Get Rid of a Hangover?: No CAGE-AID Score: 0  Substance Abuse Education Offered: Yes    Blima Ledger, [place] Social Worker [phone]

## 2020-01-09 DIAGNOSIS — I5043 Acute on chronic combined systolic (congestive) and diastolic (congestive) heart failure: Secondary | ICD-10-CM | POA: Diagnosis not present

## 2020-01-09 DIAGNOSIS — Z515 Encounter for palliative care: Secondary | ICD-10-CM

## 2020-01-09 DIAGNOSIS — I4891 Unspecified atrial fibrillation: Secondary | ICD-10-CM | POA: Diagnosis not present

## 2020-01-09 DIAGNOSIS — I350 Nonrheumatic aortic (valve) stenosis: Secondary | ICD-10-CM | POA: Diagnosis not present

## 2020-01-09 DIAGNOSIS — S2242XA Multiple fractures of ribs, left side, initial encounter for closed fracture: Secondary | ICD-10-CM | POA: Diagnosis not present

## 2020-01-09 DIAGNOSIS — R52 Pain, unspecified: Secondary | ICD-10-CM

## 2020-01-09 MED ORDER — HYDROMORPHONE HCL 1 MG/ML IJ SOLN
0.5000 mg | INTRAMUSCULAR | Status: DC | PRN
  Administered 2020-01-09 (×2): 0.5 mg via INTRAVENOUS
  Filled 2020-01-09: qty 1

## 2020-01-09 MED ORDER — HALOPERIDOL LACTATE 5 MG/ML IJ SOLN
2.0000 mg | INTRAMUSCULAR | Status: DC | PRN
  Administered 2020-01-09: 2 mg via INTRAVENOUS
  Filled 2020-01-09: qty 1

## 2020-01-09 MED ORDER — MORPHINE 100MG IN NS 100ML (1MG/ML) PREMIX INFUSION
2.0000 mg/h | INTRAVENOUS | Status: DC
  Administered 2020-01-09: 2 mg/h via INTRAVENOUS
  Filled 2020-01-09: qty 100

## 2020-01-09 MED ORDER — MORPHINE SULFATE (CONCENTRATE) 10 MG/0.5ML PO SOLN
5.0000 mg | ORAL | Status: DC
  Administered 2020-01-09: 5 mg via SUBLINGUAL
  Filled 2020-01-09 (×2): qty 0.5

## 2020-01-09 MED ORDER — MORPHINE BOLUS VIA INFUSION
1.0000 mg | INTRAVENOUS | Status: DC | PRN
  Administered 2020-01-09: 2 mg via INTRAVENOUS
  Administered 2020-01-09 – 2020-01-10 (×2): 1 mg via INTRAVENOUS
  Administered 2020-01-10: 2 mg via INTRAVENOUS
  Filled 2020-01-09: qty 2

## 2020-01-09 MED ORDER — LORAZEPAM 2 MG/ML IJ SOLN: 1.0000 mg | INTRAMUSCULAR | Status: DC | PRN

## 2020-01-09 NOTE — Progress Notes (Signed)
Pt admitted to [place] from [place], family at bedside.

## 2020-01-09 NOTE — TOC Progression Note (Signed)
Transition of Care [facility]) - Progression Note    Patient Details  Name: Larry Church MRN: [id number] Date of Birth: Dec 23, 1932  Transition of Care [facility]) CM/SW [place], [place] Phone Number: 01/09/2020, 10:59 AM  Clinical Narrative:    CSW reached out to [employer] with [place], confirmed that no beds were available today. CSW will continue to follow.         Expected Discharge Plan and Services                                                 Social Determinants of Health (SDOH) Interventions    Readmission Risk Interventions No flowsheet data found.

## 2020-01-09 NOTE — Progress Notes (Signed)
Palliative Medicine RN Note: Rec'd a call from pt's daughter Emeline General requesting call back from our NP Gregary Signs. She reports patient is uncomfottable and that the patient's wife is considering taking home.  I messaged our NP Gregary Signs, who will call her. I attempted to call his RN, but she was unavailable; sent Epic chat requesting she bring prn Dilaudid.  Marjie Skiff Maitland Lesiak, RN, BSN, [facility] Palliative Medicine Team 01/09/2020 8:57 AM Office [phone]

## 2020-01-09 NOTE — Progress Notes (Signed)
Morphine drip started at 2 with a bolus of 2 given. Family at bedside.

## 2020-01-09 NOTE — Progress Notes (Addendum)
Daily Progress Note   Patient Name: Larry Church       Date: 01/09/2020 DOB: January 11, 1933  Age: 86 y.o. MRN#: [id number] Attending Physician: Geradine Girt, DO Primary Care Physician: Lilian Coma., MD Admit Date: 12/25/2019  Reason for Follow-up: terminal care, symptom management  Subjective: 9:05--Spoke with daughter Seth Bake by phone. She states patient has been very agitated last night and this morning. Her mother is concerned that the agitation is caused by the hospital setting, and is asking about taking him home. I reassured Seth Bake that I would initiate orders quickly to help ensure his comfort.  9:45--I arrive to room, nursing had just administered haldol per my request. Patient is now somnolent with excessive respiratory secretions note. I have advised nursing to administer a dose of robinul.  Wife and daughter are at bedside. They share that patient choked on a sip of water earlier. I provide counseling and education on expectations at EOL. Discussed that agitation is a common symptom at EOL but can be effectively managed with medication in most cases. Discussed that in order to keep patients comfortable sometimes the trade off is they are not awake. Family agrees comfort is the priority. Discussed that unfortunately the hospice facility in [facility] does not have a bed today, but that we can transfer him to 6N in the meantime to provide a more peaceful environment for patient and family.    Length of Stay: 3  Current Medications: Scheduled Meds:  . alfuzosin  10 mg Oral Q breakfast  . apixaban  5 mg Oral BID  . diltiazem  60 mg Oral Q6H  . finasteride  5 mg Oral Daily  . furosemide  40 mg Intravenous Daily  . gabapentin  100 mg Oral TID  . isosorbide mononitrate  30 mg Oral  Daily  . metoprolol tartrate  25 mg Oral BID  . morphine CONCENTRATE  5 mg Sublingual Q4H  . pantoprazole  40 mg Oral Daily  . sodium chloride flush  3 mL Intravenous Q12H  . venlafaxine XR  150 mg Oral Daily   And  . venlafaxine XR  75 mg Oral QHS       PRN Meds: acetaminophen **OR** acetaminophen, albuterol, antiseptic oral rinse, glycopyrrolate, haloperidol lactate, HYDROmorphone (DILAUDID) injection, ipratropium, LORazepam **OR** LORazepam, metoprolol tartrate, ondansetron **OR** ondansetron (  ZOFRAN) IV, polyethylene glycol, polyvinyl alcohol  Physical Exam Vitals reviewed.  Constitutional:      General: He is not in acute distress.    Appearance: He is ill-appearing.     Comments: somnolent  Pulmonary:     Effort: Pulmonary effort is normal.             Vital Signs: BP (!) 124/94   Pulse (!) 108   Temp 98.6 F (37 C)   Resp (!) 23   Ht 5\' 6"  (1.676 m)   Wt 108.3 kg   SpO2 97%   BMI 38.54 kg/m  SpO2: SpO2: 97 % O2 Device: O2 Device: Room Air O2 Flow Rate: O2 Flow Rate (L/min): 3 L/min  Intake/output summary:   Intake/Output Summary (Last 24 hours) at 01/09/2020 7948 Last data filed at 01/09/2020 0900 Gross per 24 hour  Intake 909.24 ml  Output 2000 ml  Net -1090.76 ml   LBM: Last BM Date: 01/07/20 Baseline Weight: Weight: 106.9 kg Most recent weight: Weight: 108.3 kg       Palliative Assessment/Data: PPS 10%        Palliative Care Assessment & Plan   HPI/Patient Profile:87 y.o.malewith past medical history of CAD, chronic combined systolic and diastolic heart failure, ischemic cardiomyopathy, atrial fibrillation, history of subarachnoid hemorrhage, HTN, and hyperlipidemia. He was hospitalized 10/28 through 10/31 for CHF exacerbation and found to have severe aortic stenosis and interstitial lung disease. He presented to the emergency departmenton 11/21/2021with shortness of breath and pain from a fall 3 days ago. ED Course:A-fib with RVR in  the 140's. Imaging shows left rib fracture and left effusion versus hemothorax versus pneumonia. WBC 14 and BNP 363. Started on diltiazem infusion and given Levaquin.  Patient admitted to [facility] for management of left rib fractures, A-fib with RVR, acute on chronic respiratory failure, and acute on chronic CHF with mild volume overload.  Assessment: - multiple rib fractures - left chest pain secondary to rib fractures - acute on chronic respiratory failure - A-fib with RVR - acute on chronic combined systolic and diastolic heart failure - aortic stenosis, severe - dementia - OSA on CPAP - ?terminal delirium  Recommendations/Plan: - continue full comfort care - haldol 2 mg every 4 hours prn agitation - changed scheduled morphine to 5 mg every 4 hours - please give robinul prn for secretions - please utilize dilaudid 0.5 mg every 2 hours prn for breakthrough pain - patient is likely aspirating - I have discontinued all oral medications - transfer to 6N  Goals of Care and Additional Recommendations:  Limitations on Scope of Treatment: Full Comfort Care  Code Status: DNR/DNI  Prognosis:  days  Discharge Planning:  Hospice facility versus hospital death  Care plan was discussed with attending MD, nursing, case management, hospice liaison  Thank you for allowing the Palliative Medicine Team to assist in the care of this patient.   Total Time 35 minutes Prolonged Time Billed  no      Greater than 50%  of this time was spent counseling and coordinating care related to the above assessment and plan.  Lavena Bullion, NP  Please contact Palliative Medicine Team phone at [phone] for questions and concerns.

## 2020-01-09 NOTE — Care Management Important Message (Signed)
Important Message  Patient Details  Name: Larry Church MRN: [id number] Date of Birth: 1932-05-25   Medicare Important Message Given:  Yes     Shelda Altes 01/09/2020, 9:58 AM

## 2020-01-09 NOTE — Progress Notes (Signed)
Wife at bedside and is going to stay the night with her husband.  Pt appears comfortable with morphine drip at 2.

## 2020-01-09 NOTE — Progress Notes (Addendum)
Progress Note    Larry Church  VQX:[id number] DOB: 07-14-1932  DOA: 01/09/2020 PCP: Lilian Coma., MD    Brief Narrative:     Medical records reviewed and are as summarized below:  Larry Church is an 86 y.o. male with medical history significant ofGERD, combined systolic and diastolic heart failure, severe aortic stenosis, BPH, CAD, degenerative disc disease, depression, hyperlipidemia, hypertension, atrial fibrillation, interstitial lung disease, OSA on CPAP, memory deficits who presents after EMS came out to see patient for a fall and was found to be tachycardic and hypoxic. On admission with afib rvr, started on cardizem gtt.  Seen by cardiology.  Poor overall prognosis.  Seen by palliative care and transitioned to hospice.  Await residential hospice bed.  Assessment/Plan:   Principal Problem:   Atrial fibrillation with RVR (HCC) Active Problems:   Essential hypertension   Morbid obesity (HCC)   Traumatic subarachnoid hemorrhage (HCC)   Obstructive sleep apnea treated with bilevel positive airway pressure (BiPAP)   Gastroesophageal reflux disease without esophagitis   Longstanding persistent atrial fibrillation (HCC)   Mild cognitive impairment   Aortic stenosis, severe   Acute on chronic combined systolic and diastolic CHF (congestive heart failure) (HCC)   Physical deconditioning   Multiple rib fractures   Palliative care by specialist   Goals of care, counseling/discussion   Acute on chronic respiratory failure/ILD -this is multifactorial given severe AS, interstitial lung disease, A. fib RVR, chronic dyspnea, severe deconditioning, and  CHF  - transitioned to comfort care  Afib RVR - known h/o Permanent Afib - on Eliquis, rate controlled with Lopressor 50 mg BID - but pt refused home meds including Lopressor & Eliquis. : Rates are in the 120 - 140s;  Started on IV Diltiazem gtt (titrated up to 15 mg/hr - but without titration boluses) - transitioned to  comfort care  Aortic stenosis/Mild to Mod MS - previous echo at [facility] in 2020 showed severe AS however cath showed moderate. Repeat echo showed severe low flow gradient AS and moderate MS - he is not a good surgical/TAVR candidate given underlying lung disease - - transitioned to comfort care  Acute on Chronic combined CHF -- transitioned to comfort care  HTN - - transitioned to comfort care  HLD - transitioned to comfort care  CAD - transitioned to comfort care  Left rib fractures - comfort focused -continue pain meds  obesity Body mass index is 38.54 kg/m.   Family Communication/Anticipated D/C date and plan/Code Status   DVT prophylaxis: eliquis Code Status: DNR  Family Communication: wife at bedside Disposition Plan: Status is: Inpatient  Remains inpatient appropriate because:Inpatient level of care appropriate due to severity of illness   Dispo: The patient is from: Home              Anticipated d/c is to: residential hospice              Anticipated d/c date is: 1 day              Patient currently is medically stable to d/c.-- awaiting bed, not safe to go home with family         Medical Consultants:    Cards  Palliative care  Subjective:   Periods of agitation, difficulty breathing  Objective:    Vitals:   01/08/20 2135 01/08/20 2218 01/09/20 0020 01/09/20 0914  BP: (!) 141/94 112/78 (!) 124/94 108/73  Pulse: (!) 36 (!) 108    Resp:  Temp: 98.6 F (37 C)     TempSrc:      SpO2: 97%   96%  Weight:      Height:        Intake/Output Summary (Last 24 hours) at 01/09/2020 1036 Last data filed at 01/09/2020 0900 Gross per 24 hour  Intake 909.24 ml  Output 2000 ml  Net -1090.76 ml   Filed Weights   01/07/20 1650 01/08/20 0014  Weight: 106.9 kg 108.3 kg    Exam: Appears uncomfortable, increased respirations and skin sweaty   Data Reviewed:   I have personally reviewed following labs and imaging  studies:  Labs: Labs show the following:   Basic Metabolic Panel: Recent Labs  Lab 01/14/2020 2012 12/17/2019 2012 01/07/20 0551 01/08/20 0335  NA 132*  --  135 134*  K 4.5   < > 4.0 4.1  CL 99  --  99 101  CO2 21*  --  24 23  GLUCOSE 157*  --  135* 135*  BUN 20  --  19 22  CREATININE 1.13  --  1.10 1.14  CALCIUM 9.4  --  9.3 9.4   < > = values in this interval not displayed.   GFR Estimated Creatinine Clearance: 54.7 mL/min (by C-G formula based on SCr of 1.14 mg/dL). Liver Function Tests: Recent Labs  Lab 01/07/20 0551  AST 28  ALT 20  ALKPHOS 65  BILITOT 1.1  PROT 6.7  ALBUMIN 2.4*   No results for input(s): LIPASE, AMYLASE in the last 168 hours. No results for input(s): AMMONIA in the last 168 hours. Coagulation profile No results for input(s): INR, PROTIME in the last 168 hours.  CBC: Recent Labs  Lab 01/07/2020 2012 01/07/20 0551  WBC 14.2* 12.8*  HGB 12.9* 12.1*  HCT 40.4 37.7*  MCV 105.8* 105.0*  PLT 276 253   Cardiac Enzymes: No results for input(s): CKTOTAL, CKMB, CKMBINDEX, TROPONINI in the last 168 hours. BNP (last 3 results) No results for input(s): PROBNP in the last 8760 hours. CBG: No results for input(s): GLUCAP in the last 168 hours. D-Dimer: No results for input(s): DDIMER in the last 72 hours. Hgb A1c: No results for input(s): HGBA1C in the last 72 hours. Lipid Profile: No results for input(s): CHOL, HDL, LDLCALC, TRIG, CHOLHDL, LDLDIRECT in the last 72 hours. Thyroid function studies: No results for input(s): TSH, T4TOTAL, T3FREE, THYROIDAB in the last 72 hours.  Invalid input(s): FREET3 Anemia work up: No results for input(s): VITAMINB12, FOLATE, FERRITIN, TIBC, IRON, RETICCTPCT in the last 72 hours. Sepsis Labs: Recent Labs  Lab 01/08/2020 2012 01/07/20 0551  WBC 14.2* 12.8*    Microbiology Recent Results (from the past 240 hour(s))  Respiratory Panel by RT PCR (Flu A&B, Covid) - Nasopharyngeal Swab     Status: None    Collection Time: 01/09/2020  9:10 PM   Specimen: Nasopharyngeal Swab; Nasopharyngeal(NP) swabs in vial transport medium  Result Value Ref Range Status   SARS Coronavirus 2 by RT PCR NEGATIVE NEGATIVE Final    Comment: (NOTE) SARS-CoV-2 target nucleic acids are NOT DETECTED.  The SARS-CoV-2 RNA is generally detectable in upper respiratoy specimens during the acute phase of infection. The lowest concentration of SARS-CoV-2 viral copies this assay can detect is 131 copies/mL. A negative result does not preclude SARS-Cov-2 infection and should not be used as the sole basis for treatment or other patient management decisions. A negative result may occur with  improper specimen collection/handling, submission of specimen other than nasopharyngeal  swab, presence of viral mutation(s) within the areas targeted by this assay, and inadequate number of viral copies (<131 copies/mL). A negative result must be combined with clinical observations, patient history, and epidemiological information. The expected result is Negative.  Fact Sheet for Patients:  PinkCheek.be  Fact Sheet for Healthcare Providers:  GravelBags.it  This test is no t yet approved or cleared by the [place] FDA and  has been authorized for detection and/or diagnosis of SARS-CoV-2 by FDA under an Emergency Use Authorization (EUA). This EUA will remain  in effect (meaning this test can be used) for the duration of the COVID-19 declaration under Section 564(b)(1) of the Act, 21 U.S.C. section 360bbb-3(b)(1), unless the authorization is terminated or revoked sooner.     Influenza A by PCR NEGATIVE NEGATIVE Final   Influenza B by PCR NEGATIVE NEGATIVE Final    Comment: (NOTE) The Xpert Xpress SARS-CoV-2/FLU/RSV assay is intended as an aid in  the diagnosis of influenza from Nasopharyngeal swab specimens and  should not be used as a sole basis for treatment. Nasal  washings and  aspirates are unacceptable for Xpert Xpress SARS-CoV-2/FLU/RSV  testing.  Fact Sheet for Patients: PinkCheek.be  Fact Sheet for Healthcare Providers: GravelBags.it  This test is not yet approved or cleared by the [place] FDA and  has been authorized for detection and/or diagnosis of SARS-CoV-2 by  FDA under an Emergency Use Authorization (EUA). This EUA will remain  in effect (meaning this test can be used) for the duration of the  Covid-19 declaration under Section 564(b)(1) of the Act, 21  U.S.C. section 360bbb-3(b)(1), unless the authorization is  terminated or revoked. Performed at [facility], [place] [address] [place], [place] [address]     Procedures and diagnostic studies:  No results found.  Medications:   . alfuzosin  10 mg Oral Q breakfast  . apixaban  5 mg Oral BID  . diltiazem  60 mg Oral Q6H  . finasteride  5 mg Oral Daily  . furosemide  40 mg Intravenous Daily  . gabapentin  100 mg Oral TID  . isosorbide mononitrate  30 mg Oral Daily  . metoprolol tartrate  25 mg Oral BID  . morphine CONCENTRATE  5 mg Sublingual Q4H  . pantoprazole  40 mg Oral Daily  . sodium chloride flush  3 mL Intravenous Q12H  . venlafaxine XR  150 mg Oral Daily   And  . venlafaxine XR  75 mg Oral QHS   Continuous Infusions:   LOS: 3 days   Geradine Girt  [facility]   How to contact the [facility] Attending or Consulting provider [place] or covering provider during after hours [place], for this patient?  1. Check the care team in [facility] and look for a) attending/consulting [facility] provider listed and b) the [facility] team listed 2. Log into [email or web address] and use [place]'s universal password to access. If you do not have the password, please contact the hospital operator. 3. Locate the [facility] provider you are looking for under [facility] and page to a number that you can be directly reached. 4. If  you still have difficulty reaching the provider, please page the [facility] (Director on Call) for the Hospitalists listed on amion for assistance.  01/09/2020, 10:36 AM

## 2020-01-09 NOTE — Progress Notes (Signed)
   I have received the referral for hospice services at the Hospice home in [facility]. We unfortunately continue to have no bed availability at this time. He is on the list and if we have something change or open up this afternoon I will be happy to reach out to CM or NP with PC to update. We will be working tomorrow for holiday and will continue to follow if nothing come available sooner.    Webb Silversmith RN [phone]

## 2020-01-09 NOTE — Progress Notes (Signed)
RT NOTE:  Pt refuses CPAP tonight. 

## 2020-01-09 NOTE — Progress Notes (Signed)
This chaplain responded to PMT consult for Pt. and family spiritual care.  The chaplain understands the Pt. is waiting for a bed at [facility].   The Pt. Wife-Mary and son are bedside.    The chaplain visited with the family as the Pt. arrived on  6N.  The chaplain updated the RN and PMT on the Pt. discomfort level with the permission of the Pt. Family.  The invitation for F/U spiritual care was accepted by the family as they arrive at the Pt. bedside.

## 2020-01-09 NOTE — Progress Notes (Signed)
Patient ID: Larry Church, male   DOB: 1932/07/31, 86 y.o.   MRN: [id number]   Patient has transferred to 6N. Nursing reports she has given the scheduled morphine and the prn dilaudid, and patient remains uncomfortable and restless. I have advised starting a morphine infusion. I called Seth Bake and let her know this will be the best way to keep him comfortable and she agrees.   - start morphine infusion (start at 2 mg/hr, may titrate up to 10 mg/hr) - morphine bolus via infusion, 1-2 mg every 15 min as needed for uncontrolled pain, dyspnea, or respiratory rate greater than 25 - D/C scheduled morphine solution  - D/C prn dilaudid   Thank you for allowing the Palliative Medicine Team to assist in the care of this patient.   Greater than 50%  of this time was spent counseling and coordinating care related to the above assessment and plan.  Total time: 15 minutes   Lavena Bullion, NP Palliative Medicine   Please contact Palliative Medicine Team phone at [phone] for questions and concerns.  For individual provider, see AMION.

## 2020-01-10 DIAGNOSIS — I350 Nonrheumatic aortic (valve) stenosis: Secondary | ICD-10-CM | POA: Diagnosis not present

## 2020-01-10 DIAGNOSIS — I4891 Unspecified atrial fibrillation: Secondary | ICD-10-CM | POA: Diagnosis not present

## 2020-01-10 DIAGNOSIS — I5043 Acute on chronic combined systolic (congestive) and diastolic (congestive) heart failure: Secondary | ICD-10-CM | POA: Diagnosis not present

## 2020-01-10 DIAGNOSIS — S2242XA Multiple fractures of ribs, left side, initial encounter for closed fracture: Secondary | ICD-10-CM | POA: Diagnosis not present

## 2020-01-10 MED ORDER — SCOPOLAMINE 1 MG/3DAYS TD PT72
1.0000 | MEDICATED_PATCH | TRANSDERMAL | Status: DC
  Administered 2020-01-10: 1.5 mg via TRANSDERMAL
  Filled 2020-01-10: qty 1

## 2020-01-16 NOTE — Progress Notes (Signed)
Daily Progress Note   Patient Name: Larry Church       Date: 01-29-2020 DOB: 20-Sep-1932  Age: 86 y.o. MRN#: [id number] Attending Physician: Geradine Girt, DO Primary Care Physician: Lilian Coma., MD Admit Date: 12/30/2019  Reason for Consultation/Follow-up:   Subjective: Patient appears comfortable. Morphine infusion is at 2 mg/hr. He remains on oxygen at 2L via nasal cannula.   Family is at bedside. His wife inquires if he might become more conscious, and I stated that it was not likely at this point. We discussed that his prognosis is days at this point. I provided education and counseling on expectations at EOL. Emotional support provided. I encouraged her to hold his hand and talk to him. Provided family with a copy of the book "Gone from my sight".   Physical Exam       Neuro: Unresponsive to voice and light touch Pulmonary: Respirations even and unlabored    Length of Stay: 4  Current Medications: Scheduled Meds:  . furosemide  40 mg Intravenous Daily  . scopolamine  1 patch Transdermal Q72H  . sodium chloride flush  3 mL Intravenous Q12H    Continuous Infusions: . morphine 3 mg/hr (01-29-2020 0512)    PRN Meds: acetaminophen **OR** acetaminophen, albuterol, antiseptic oral rinse, glycopyrrolate, haloperidol lactate, ipratropium, LORazepam, metoprolol tartrate, morphine, ondansetron **OR** ondansetron (ZOFRAN) IV, polyethylene glycol, polyvinyl alcohol            Vital Signs: BP 140/70 (BP Location: Right Leg)   Pulse 75   Temp 99.7 F (37.6 C) (Tympanic)   Resp 18   Ht 5\' 6"  (1.676 m)   Wt 108.3 kg   SpO2 (!) 72%   BMI 38.54 kg/m  SpO2: SpO2: (!) 72 % O2 Device: O2 Device: Nasal Cannula O2 Flow Rate: O2 Flow Rate (L/min): 3 L/min  Intake/output  summary:   Intake/Output Summary (Last 24 hours) at 01-29-20 1536 Last data filed at 2020-01-29 1500 Gross per 24 hour  Intake 61.25 ml  Output 300 ml  Net -238.75 ml   LBM: Last BM Date: 01/07/20 Baseline Weight: Weight: 106.9 kg Most recent weight: Weight: 108.3 kg       Palliative Assessment/Data: PPS 10%      Palliative Care Assessment & Plan   HPI/Patient Profile: 86 y.o. male  with  past medical history of CAD, chronic combined systolic and diastolic heart failure, ischemic cardiomyopathy, atrial fibrillation, history of subarachnoid hemorrhage, HTN, and hyperlipidemia. He was hospitalized 10/28 through 10/31 for CHF exacerbation and found to have severe aortic stenosis and interstitial lung disease. He presented to the emergency department on 12/30/2019 with shortness of breath and pain from a fall 3 days ago.  ED Course: A-fib with RVR in the 140's. Imaging shows left rib fracture and left effusion versus hemothorax versus pneumonia. WBC 14 and BNP 363. Started on diltiazem infusion and given Levaquin.  Patient admitted to [facility] for management of left rib fractures, A-fib with RVR, acute on chronic respiratory failure, and acute on chronic CHF with mild volume overload.   Assessment: - multiple rib fractures - left chest pain secondary to rib fractures - acute on chronic respiratory failure - A-fib with RVR - acute on chronic combined systolic and diastolic heart failure - aortic stenosis, severe - dementia - OSA on CPAP - ?terminal delirium  Recommendations/Plan: - continue full comfort care - continue morphine infusion, may titrate up to 10 mg/hr as needed for comfort - transfer to either Hospice home in [facility] or [facility] when a bed is available - utilize prn medications to ensure EOL symptom management - unrestricted visitation status per [facility] EOL policy  Goals of Care and Additional Recommendations:  Limitations on Scope of Treatment: Full Comfort  Care  Code Status: DNR/DNI  Prognosis:  days  Discharge Planning:  Hospice facility versus hospital death  Care plan was discussed with nursing, CSW, hospice liaison(s)  Thank you for allowing the Palliative Medicine Team to assist in the care of this patient.   Total Time 15 minutes Prolonged Time Billed  no       Greater than 50%  of this time was spent counseling and coordinating care related to the above assessment and plan.  Lavena Bullion, NP  Please contact Palliative Medicine Team phone at [phone] for questions and concerns.

## 2020-01-16 NOTE — Death Summary Note (Signed)
DEATH SUMMARY   Patient Details  Name: Larry Church MRN: [id number] DOB: 24-Aug-1932  Admission/Discharge Information   Admit Date:  2020-01-08  Date of Death: Date of Death: 01-12-20  Time of Death: Time of Death: 1841  Length of Stay: 4  Referring Physician: Lilian Coma., MD   Reason(s) for Hospitalization   Falls, tachycardia, shortness of breath  Diagnoses  Preliminary cause of death:  Secondary Diagnoses (including complications and co-morbidities):  Principal Problem:   Atrial fibrillation with rapid ventricular response ([place]) Active Problems:   Essential hypertension   Morbid obesity ([place])   Traumatic subarachnoid hemorrhage ([place])   Obstructive sleep apnea treated with bilevel positive airway pressure (BiPAP)   Gastroesophageal reflux disease without esophagitis   Longstanding persistent atrial fibrillation (HCC)   Mild cognitive impairment   Aortic stenosis, severe   Acute on chronic combined systolic and diastolic CHF (congestive heart failure) ([place])   Physical deconditioning   Multiple rib fractures   Palliative care by specialist   Goals of care, counseling/discussion   Terminal care   Pain   Brief Hospital Course (including significant findings, care, treatment, and services provided and events leading to death)  Acute on chronic respiratory failure/ILD -this is multifactorial given severe AS, interstitial lung disease, A. fib RVR, chronic dyspnea,severe deconditioning, andCHF  - transitioned to comfort care  Afib RVR- known h/o Permanent Afib - on Eliquis, rate controlled with Lopressor 50 mg BID- but pt refused home meds including Lopressor & Eliquis. :Rates arein the 120- 140s; Started on IV Diltiazem gtt (titrated up to 15 mg/hr - but without titration boluses) - transitioned to comfort care  Aortic stenosis/Mild to Mod MS - previous echo at [facility] in 2020 showed severe AS however cath showed moderate. Repeat echo showed severe low flow  gradient AS and moderate MS - he is not a good surgical/TAVR candidategiven underlying lung disease - - transitioned to comfort care  Acute on Chronic combined CHF -- transitioned to comfort care  HTN- - transitioned to comfort care  HLD - transitioned to comfort care  CAD - transitioned to comfort care  Left rib fractures - comfort focused -continue pain meds  obesity Body mass index is 38.54 kg/m.     Pertinent Labs and Studies  Significant Diagnostic Studies DG Chest Portable 1 View  Result Date: 01/07/2020 CLINICAL DATA:  Decreased breath sounds and chest pain. EXAM: PORTABLE CHEST 1 VIEW COMPARISON:  January 08, 2020 FINDINGS: Cardiac enlargement with pulmonary vascular congestion. Interstitial pattern suggests edema. Probable small left pleural effusion. No pneumothorax. Degenerative changes in the spine and shoulders. Old left rib fractures. Calcification of the aorta. Postoperative change in the cervical spine. IMPRESSION: Cardiac enlargement with pulmonary vascular congestion and interstitial edema. Probable small left pleural effusion. Similar appearance to previous study. Electronically Signed   By: Lucienne Capers M.D.   On: 01/07/2020 01:39   DG Chest Portable 1 View  Result Date: 08-Jan-2020 CLINICAL DATA:  Dyspnea. EXAM: PORTABLE CHEST 1 VIEW COMPARISON:  12/13/2019 FINDINGS: There is cardiomegaly with vascular congestion and mild interstitial edema. There are bilateral pleural effusions with adjacent airspace disease as before. Pulmonary fibrosis is again noted. There are degenerative changes of both glenohumeral joints. Aortic calcifications are noted. There are apparent left-sided rib fractures which appear to be new from prior study. IMPRESSION: 1. Multiple acute appearing left-sided rib fractures are noted. No pneumothorax. 2. Pulmonary fibrosis again noted. 3. Persistent left-sided airspace disease favored to represent a combination of a left-sided  pleural  effusion or hemothorax in combination with atelectasis. An infiltrate is not entirely excluded. Electronically Signed   By: Constance Holster M.D.   On: 01/03/2020 21:45    Microbiology Recent Results (from the past 240 hour(s))  Respiratory Panel by RT PCR (Flu A&B, Covid) - Nasopharyngeal Swab     Status: None   Collection Time: 01/01/2020  9:10 PM   Specimen: Nasopharyngeal Swab; Nasopharyngeal(NP) swabs in vial transport medium  Result Value Ref Range Status   SARS Coronavirus 2 by RT PCR NEGATIVE NEGATIVE Final    Comment: (NOTE) SARS-CoV-2 target nucleic acids are NOT DETECTED.  The SARS-CoV-2 RNA is generally detectable in upper respiratoy specimens during the acute phase of infection. The lowest concentration of SARS-CoV-2 viral copies this assay can detect is 131 copies/mL. A negative result does not preclude SARS-Cov-2 infection and should not be used as the sole basis for treatment or other patient management decisions. A negative result may occur with  improper specimen collection/handling, submission of specimen other than nasopharyngeal swab, presence of viral mutation(s) within the areas targeted by this assay, and inadequate number of viral copies (<131 copies/mL). A negative result must be combined with clinical observations, patient history, and epidemiological information. The expected result is Negative.  Fact Sheet for Patients:  PinkCheek.be  Fact Sheet for Healthcare Providers:  GravelBags.it  This test is no t yet approved or cleared by the [place] FDA and  has been authorized for detection and/or diagnosis of SARS-CoV-2 by FDA under an Emergency Use Authorization (EUA). This EUA will remain  in effect (meaning this test can be used) for the duration of the COVID-19 declaration under Section 564(b)(1) of the Act, 21 U.S.C. section 360bbb-3(b)(1), unless the authorization is terminated  or revoked sooner.     Influenza A by PCR NEGATIVE NEGATIVE Final   Influenza B by PCR NEGATIVE NEGATIVE Final    Comment: (NOTE) The Xpert Xpress SARS-CoV-2/FLU/RSV assay is intended as an aid in  the diagnosis of influenza from Nasopharyngeal swab specimens and  should not be used as a sole basis for treatment. Nasal washings and  aspirates are unacceptable for Xpert Xpress SARS-CoV-2/FLU/RSV  testing.  Fact Sheet for Patients: PinkCheek.be  Fact Sheet for Healthcare Providers: GravelBags.it  This test is not yet approved or cleared by the [place] FDA and  has been authorized for detection and/or diagnosis of SARS-CoV-2 by  FDA under an Emergency Use Authorization (EUA). This EUA will remain  in effect (meaning this test can be used) for the duration of the  Covid-19 declaration under Section 564(b)(1) of the Act, 21  U.S.C. section 360bbb-3(b)(1), unless the authorization is  terminated or revoked. Performed at [facility], [place] [address], [place], [place] [address]     Lab Basic Metabolic Panel: Recent Labs  Lab 01/08/20 0335  NA 134*  K 4.1  CL 101  CO2 23  GLUCOSE 135*  BUN 22  CREATININE 1.14  CALCIUM 9.4   Liver Function Tests: No results for input(s): AST, ALT, ALKPHOS, BILITOT, PROT, ALBUMIN in the last 168 hours. No results for input(s): LIPASE, AMYLASE in the last 168 hours. No results for input(s): AMMONIA in the last 168 hours. CBC: No results for input(s): WBC, NEUTROABS, HGB, HCT, MCV, PLT in the last 168 hours. Cardiac Enzymes: No results for input(s): CKTOTAL, CKMB, CKMBINDEX, TROPONINI in the last 168 hours. Sepsis Labs: No results for input(s): PROCALCITON, WBC, LATICACIDVEN in the last 168 hours.    Janett Billow  U Tam Delisle 01/14/2020, 4:22 PM

## 2020-01-16 NOTE — Progress Notes (Signed)
Manufacturing engineer [facility])  Referral received for residential hospice at [facility].  Noted that family would prefer HOP for end of life care, but are eager to leave the hospital if possible.  [facility] does not have a bed to offer today.  [facility] will updated TOC and family once bed availability changes.  Venia Carbon RN, BSN, [facility] Liaison

## 2020-01-16 NOTE — Progress Notes (Signed)
[facility] is following this patient for potential admission to the Hospice Home in [facility]). Unfortunately, we continue to have no bed availability at this time. Gregary Signs, Palliative Care NP updated. Will continue to follow at a distance and offer bed when one becomes available.  Please feel free to contact us with any questions.  Claudie Revering, [place] [phone]

## 2020-01-16 NOTE — Progress Notes (Signed)
Just informed ME Larry Church which he confirmed that patient is a ME case since SOB can be attributed to the fractured ribs patient sustained previously from a fall.

## 2020-01-16 NOTE — Progress Notes (Signed)
Patient family members still at room giving them time to grieve from the recent loss.

## 2020-01-16 NOTE — Progress Notes (Signed)
Progress Note    Larry Church  OVF:[id number] DOB: 02/16/1932  DOA: 01/05/2020 PCP: Lilian Coma., MD    Brief Narrative:     Medical records reviewed and are as summarized below:  Larry Church is an 86 y.o. male with medical history significant ofGERD, combined systolic and diastolic heart failure, severe aortic stenosis, BPH, CAD, degenerative disc disease, depression, hyperlipidemia, hypertension, atrial fibrillation, interstitial lung disease, OSA on CPAP, memory deficits who presents after EMS came out to see patient for a fall and was found to be tachycardic and hypoxic. On admission with afib rvr, started on cardizem gtt.  Seen by cardiology.  Poor overall prognosis.  Seen by palliative care and transitioned to hospice.  Await residential hospice bed.  Assessment/Plan:   Principal Problem:   Atrial fibrillation with rapid ventricular response (HCC) Active Problems:   Essential hypertension   Morbid obesity (HCC)   Traumatic subarachnoid hemorrhage (HCC)   Obstructive sleep apnea treated with bilevel positive airway pressure (BiPAP)   Gastroesophageal reflux disease without esophagitis   Longstanding persistent atrial fibrillation (HCC)   Mild cognitive impairment   Aortic stenosis, severe   Acute on chronic combined systolic and diastolic CHF (congestive heart failure) (HCC)   Physical deconditioning   Multiple rib fractures   Palliative care by specialist   Goals of care, counseling/discussion   Terminal care   Pain   Acute on chronic respiratory failure/ILD -this is multifactorial given severe AS, interstitial lung disease, A. fib RVR, chronic dyspnea, severe deconditioning, and  CHF  - transitioned to comfort care  Afib RVR - known h/o Permanent Afib - on Eliquis, rate controlled with Lopressor 50 mg BID - but pt refused home meds including Lopressor & Eliquis. : Rates are in the 120 - 140s;  Started on IV Diltiazem gtt (titrated up to 15 mg/hr - but  without titration boluses) - transitioned to comfort care  Aortic stenosis/Mild to Mod MS - previous echo at [facility] in 2020 showed severe AS however cath showed moderate. Repeat echo showed severe low flow gradient AS and moderate MS - he is not a good surgical/TAVR candidate given underlying lung disease - - transitioned to comfort care  Acute on Chronic combined CHF -- transitioned to comfort care  HTN - - transitioned to comfort care  HLD - transitioned to comfort care  CAD - transitioned to comfort care  Left rib fractures - comfort focused -continue pain meds  obesity Body mass index is 38.54 kg/m.   Will add scopolamine patch--only given robinol 2 x per day despite q 4 hour order  Family Communication/Anticipated D/C date and plan/Code Status   Code Status: DNR  Family Communication: wife at bedside Disposition Plan: Status is: Inpatient  Remains inpatient appropriate because:Inpatient level of care appropriate due to severity of illness   Dispo: The patient is from: Home              Anticipated d/c is to: residential hospice              Anticipated d/c date is: 1 day              Patient currently is medically stable to d/c.-- awaiting bed at residential hospice, not safe to go home with family         Medical Consultants:    Cards  Palliative care  Subjective:   Rattling breath sounds  Objective:    Vitals:   01/09/20 0020 01/09/20 0914  01/09/20 1345 February 04, 2020 0500  BP: (!) 124/94 108/73 (!) 151/88 140/70  Pulse:   71 75  Resp:   20 18  Temp:   97.6 F (36.4 C) 99.7 F (37.6 C)  TempSrc:   Oral Tympanic  SpO2:  96% 90% (!) 72%  Weight:      Height:        Intake/Output Summary (Last 24 hours) at February 04, 2020 1203 Last data filed at 02/04/20 1000 Gross per 24 hour  Intake 45.42 ml  Output 300 ml  Net -254.58 ml   Filed Weights   01/07/20 1650 01/08/20 0014  Weight: 106.9 kg 108.3 kg    Exam: Not responsive to voice  or touch Rattling respirations but appears comfortable   Data Reviewed:   I have personally reviewed following labs and imaging studies:  Labs: Labs show the following:   Basic Metabolic Panel: Recent Labs  Lab 12/28/2019 2012 01/03/2020 2012 01/07/20 0551 01/08/20 0335  NA 132*  --  135 134*  K 4.5   < > 4.0 4.1  CL 99  --  99 101  CO2 21*  --  24 23  GLUCOSE 157*  --  135* 135*  BUN 20  --  19 22  CREATININE 1.13  --  1.10 1.14  CALCIUM 9.4  --  9.3 9.4   < > = values in this interval not displayed.   GFR Estimated Creatinine Clearance: 54.7 mL/min (by C-G formula based on SCr of 1.14 mg/dL). Liver Function Tests: Recent Labs  Lab 01/07/20 0551  AST 28  ALT 20  ALKPHOS 65  BILITOT 1.1  PROT 6.7  ALBUMIN 2.4*   No results for input(s): LIPASE, AMYLASE in the last 168 hours. No results for input(s): AMMONIA in the last 168 hours. Coagulation profile No results for input(s): INR, PROTIME in the last 168 hours.  CBC: Recent Labs  Lab 01/15/2020 2012 01/07/20 0551  WBC 14.2* 12.8*  HGB 12.9* 12.1*  HCT 40.4 37.7*  MCV 105.8* 105.0*  PLT 276 253   Cardiac Enzymes: No results for input(s): CKTOTAL, CKMB, CKMBINDEX, TROPONINI in the last 168 hours. BNP (last 3 results) No results for input(s): PROBNP in the last 8760 hours. CBG: No results for input(s): GLUCAP in the last 168 hours. D-Dimer: No results for input(s): DDIMER in the last 72 hours. Hgb A1c: No results for input(s): HGBA1C in the last 72 hours. Lipid Profile: No results for input(s): CHOL, HDL, LDLCALC, TRIG, CHOLHDL, LDLDIRECT in the last 72 hours. Thyroid function studies: No results for input(s): TSH, T4TOTAL, T3FREE, THYROIDAB in the last 72 hours.  Invalid input(s): FREET3 Anemia work up: No results for input(s): VITAMINB12, FOLATE, FERRITIN, TIBC, IRON, RETICCTPCT in the last 72 hours. Sepsis Labs: Recent Labs  Lab 01/05/2020 2012 01/07/20 0551  WBC 14.2* 12.8*     Microbiology Recent Results (from the past 240 hour(s))  Respiratory Panel by RT PCR (Flu A&B, Covid) - Nasopharyngeal Swab     Status: None   Collection Time: 12/22/2019  9:10 PM   Specimen: Nasopharyngeal Swab; Nasopharyngeal(NP) swabs in vial transport medium  Result Value Ref Range Status   SARS Coronavirus 2 by RT PCR NEGATIVE NEGATIVE Final    Comment: (NOTE) SARS-CoV-2 target nucleic acids are NOT DETECTED.  The SARS-CoV-2 RNA is generally detectable in upper respiratoy specimens during the acute phase of infection. The lowest concentration of SARS-CoV-2 viral copies this assay can detect is 131 copies/mL. A negative result does not preclude SARS-Cov-2  infection and should not be used as the sole basis for treatment or other patient management decisions. A negative result may occur with  improper specimen collection/handling, submission of specimen other than nasopharyngeal swab, presence of viral mutation(s) within the areas targeted by this assay, and inadequate number of viral copies (<131 copies/mL). A negative result must be combined with clinical observations, patient history, and epidemiological information. The expected result is Negative.  Fact Sheet for Patients:  PinkCheek.be  Fact Sheet for Healthcare Providers:  GravelBags.it  This test is no t yet approved or cleared by the [place] FDA and  has been authorized for detection and/or diagnosis of SARS-CoV-2 by FDA under an Emergency Use Authorization (EUA). This EUA will remain  in effect (meaning this test can be used) for the duration of the COVID-19 declaration under Section 564(b)(1) of the Act, 21 U.S.C. section 360bbb-3(b)(1), unless the authorization is terminated or revoked sooner.     Influenza A by PCR NEGATIVE NEGATIVE Final   Influenza B by PCR NEGATIVE NEGATIVE Final    Comment: (NOTE) The Xpert Xpress SARS-CoV-2/FLU/RSV assay is  intended as an aid in  the diagnosis of influenza from Nasopharyngeal swab specimens and  should not be used as a sole basis for treatment. Nasal washings and  aspirates are unacceptable for Xpert Xpress SARS-CoV-2/FLU/RSV  testing.  Fact Sheet for Patients: PinkCheek.be  Fact Sheet for Healthcare Providers: GravelBags.it  This test is not yet approved or cleared by the [place] FDA and  has been authorized for detection and/or diagnosis of SARS-CoV-2 by  FDA under an Emergency Use Authorization (EUA). This EUA will remain  in effect (meaning this test can be used) for the duration of the  Covid-19 declaration under Section 564(b)(1) of the Act, 21  U.S.C. section 360bbb-3(b)(1), unless the authorization is  terminated or revoked. Performed at [facility], [place] [address] [place], [place] [address]     Procedures and diagnostic studies:  No results found.  Medications:   . furosemide  40 mg Intravenous Daily  . scopolamine  1 patch Transdermal Q72H  . sodium chloride flush  3 mL Intravenous Q12H   Continuous Infusions: . morphine 3 mg/hr (01-25-20 0512)     LOS: 4 days   Geradine Girt  [facility]   How to contact the [facility] Attending or Consulting provider [place] or covering provider during after hours [place], for this patient?  1. Check the care team in [facility] and look for a) attending/consulting [facility] provider listed and b) the [facility] team listed 2. Log into [email or web address] and use [place]'s universal password to access. If you do not have the password, please contact the hospital operator. 3. Locate the [facility] provider you are looking for under [facility] and page to a number that you can be directly reached. 4. If you still have difficulty reaching the provider, please page the [facility] (Director on Call) for the Hospitalists listed on amion for assistance.  Jan 25, 2020, 12:03 PM

## 2020-01-16 NOTE — Progress Notes (Signed)
Family members of the expired patient just left [facility].  Will informed NT to clean and prepare body for transport to morgue as soon as NT is done with VS.

## 2020-01-16 NOTE — Progress Notes (Signed)
Patient's body transported to [place] transporting informed receiving end that patient is an ME case.

## 2020-01-16 DEATH — deceased

## 2020-06-17 ENCOUNTER — Other Ambulatory Visit: Payer: Self-pay | Admitting: Physician Assistant

## 2020-09-15 ENCOUNTER — Other Ambulatory Visit: Payer: Self-pay | Admitting: Physician Assistant

## 2020-12-14 ENCOUNTER — Other Ambulatory Visit: Payer: Self-pay | Admitting: Internal Medicine
